# Patient Record
Sex: Female | Born: 1940
Health system: Southern US, Community
[De-identification: ages and names within clinical notes are randomized; demographics above are authoritative.]

## PROBLEM LIST (undated history)

## (undated) DIAGNOSIS — R7301 Impaired fasting glucose: Secondary | ICD-10-CM

## (undated) DIAGNOSIS — K219 Gastro-esophageal reflux disease without esophagitis: Secondary | ICD-10-CM

## (undated) DIAGNOSIS — I10 Essential (primary) hypertension: Secondary | ICD-10-CM

## (undated) DIAGNOSIS — K5792 Diverticulitis of intestine, part unspecified, without perforation or abscess without bleeding: Secondary | ICD-10-CM

## (undated) DIAGNOSIS — E782 Mixed hyperlipidemia: Secondary | ICD-10-CM

## (undated) DIAGNOSIS — E039 Hypothyroidism, unspecified: Secondary | ICD-10-CM

## (undated) DIAGNOSIS — J189 Pneumonia, unspecified organism: Secondary | ICD-10-CM

## (undated) HISTORY — DX: Essential (primary) hypertension: I10

## (undated) HISTORY — DX: Impaired fasting glucose: R73.01

## (undated) HISTORY — PX: OTHER SURGICAL HISTORY: SHX169

## (undated) HISTORY — DX: Mixed hyperlipidemia: E78.2

## (undated) HISTORY — PX: TOTAL ABDOMINAL HYSTERECTOMY W/ BILATERAL SALPINGOOPHORECTOMY: SHX83

## (undated) HISTORY — PX: RECTOCELE REPAIR: SHX761

## (undated) HISTORY — DX: Hypothyroidism, unspecified: E03.9

## (undated) HISTORY — DX: Gastro-esophageal reflux disease without esophagitis: K21.9

## (undated) HISTORY — PX: COLONOSCOPY: SHX174

---
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
## 2000-01-23 ENCOUNTER — Encounter: Payer: Self-pay | Admitting: Obstetrics and Gynecology

## 2000-01-23 ENCOUNTER — Ambulatory Visit (HOSPITAL_COMMUNITY): Admission: RE | Admit: 2000-01-23 | Discharge: 2000-01-23 | Payer: Self-pay | Admitting: Obstetrics and Gynecology

## 2000-03-18 ENCOUNTER — Encounter: Payer: Self-pay | Admitting: Obstetrics and Gynecology

## 2000-03-18 ENCOUNTER — Ambulatory Visit (HOSPITAL_COMMUNITY): Admission: RE | Admit: 2000-03-18 | Discharge: 2000-03-18 | Payer: Self-pay | Admitting: Obstetrics and Gynecology

## 2000-03-24 ENCOUNTER — Encounter (INDEPENDENT_AMBULATORY_CARE_PROVIDER_SITE_OTHER): Payer: Self-pay

## 2000-03-24 ENCOUNTER — Inpatient Hospital Stay (HOSPITAL_COMMUNITY): Admission: RE | Admit: 2000-03-24 | Discharge: 2000-03-26 | Payer: Self-pay | Admitting: Obstetrics and Gynecology

## 2002-02-21 ENCOUNTER — Other Ambulatory Visit: Admission: RE | Admit: 2002-02-21 | Discharge: 2002-02-21 | Payer: Self-pay | Admitting: Obstetrics and Gynecology

## 2003-02-23 ENCOUNTER — Other Ambulatory Visit: Admission: RE | Admit: 2003-02-23 | Discharge: 2003-02-23 | Payer: Self-pay | Admitting: Obstetrics and Gynecology

## 2004-03-07 ENCOUNTER — Other Ambulatory Visit: Admission: RE | Admit: 2004-03-07 | Discharge: 2004-03-07 | Payer: Self-pay | Admitting: Obstetrics and Gynecology

## 2004-09-10 ENCOUNTER — Encounter: Admission: RE | Admit: 2004-09-10 | Discharge: 2004-09-10 | Payer: Self-pay | Admitting: Obstetrics and Gynecology

## 2005-03-14 ENCOUNTER — Other Ambulatory Visit: Admission: RE | Admit: 2005-03-14 | Discharge: 2005-03-14 | Payer: Self-pay | Admitting: Obstetrics and Gynecology

## 2006-09-10 ENCOUNTER — Inpatient Hospital Stay (HOSPITAL_COMMUNITY): Admission: RE | Admit: 2006-09-10 | Discharge: 2006-09-11 | Payer: Self-pay | Admitting: Obstetrics & Gynecology

## 2006-10-01 ENCOUNTER — Ambulatory Visit (HOSPITAL_COMMUNITY): Admission: RE | Admit: 2006-10-01 | Discharge: 2006-10-01 | Payer: Self-pay | Admitting: Internal Medicine

## 2007-10-05 ENCOUNTER — Ambulatory Visit (HOSPITAL_COMMUNITY): Admission: RE | Admit: 2007-10-05 | Discharge: 2007-10-05 | Payer: Self-pay | Admitting: Internal Medicine

## 2008-06-05 ENCOUNTER — Emergency Department (HOSPITAL_COMMUNITY): Admission: EM | Admit: 2008-06-05 | Discharge: 2008-06-05 | Payer: Self-pay | Admitting: Emergency Medicine

## 2008-06-16 ENCOUNTER — Ambulatory Visit (HOSPITAL_COMMUNITY): Admission: RE | Admit: 2008-06-16 | Discharge: 2008-06-16 | Payer: Self-pay | Admitting: Internal Medicine

## 2008-10-13 ENCOUNTER — Ambulatory Visit (HOSPITAL_COMMUNITY): Admission: RE | Admit: 2008-10-13 | Discharge: 2008-10-13 | Payer: Self-pay | Admitting: Internal Medicine

## 2009-03-20 ENCOUNTER — Ambulatory Visit (HOSPITAL_COMMUNITY): Admission: RE | Admit: 2009-03-20 | Discharge: 2009-03-20 | Payer: Self-pay | Admitting: Internal Medicine

## 2009-10-15 ENCOUNTER — Ambulatory Visit (HOSPITAL_COMMUNITY): Admission: RE | Admit: 2009-10-15 | Discharge: 2009-10-15 | Payer: Self-pay | Admitting: Internal Medicine

## 2010-02-24 ENCOUNTER — Encounter: Payer: Self-pay | Admitting: Obstetrics and Gynecology

## 2010-06-18 NOTE — H&P (Signed)
NAME:  Diane Torres, CORDY NO.:  [id number]   MEDICAL RECORD NO.:  [id number]          PATIENT TYPE:  INP   LOCATION:  A336                          FACILITY:  [facility]   PHYSICIAN:  Lazaro Arms, M.D.   DATE OF BIRTH:  13-Dec-1940   DATE OF ADMISSION:  DATE OF DISCHARGE:  LH                              HISTORY & PHYSICAL   Shavontae is a 70 year old female, gravida 2, para 2, who has had a career  where she has done heavy lifting daily at work.  She is recently  retired.  I saw her originally back in May. At that time, she had a  grade IV rectocele and vaginal apex prolapse.  She said she wanted to  finish until she was retired to have surgery, and she retired last week.   On exam again, she does have the grade IV rectocele with vaginal apex  descent.  She is status post hysterectomy.  Anterior compartment is well  supported.   PAST MEDICAL HISTORY:  Hypertension.   She takes Loratadine 10 mg, hydrochlorothiazide 25 mg.  She also has  hypercholesterolemia.  She takes simvastatin.  She has gastroesophageal  reflux and takes Prilosec.  Otherwise, she takes Premarin.  She takes a  baby aspirin a day.   PAST SURGICAL HISTORY:  Hysterectomy.  Thyroid surgery.  Ovarian cyst.   REVIEW OF SYSTEMS:  Otherwise negative.   PHYSICAL EXAMINATION:  HEENT:  Unremarkable.  Thyroid is normal.  LUNGS:  Clear.  HEART:  Regular rate and rhythm without regurg or gallop.  BREASTS:  Without mass, discharge, or skin changes.  ABDOMEN:  Benign.  No hepatosplenomegaly, no masses.  She has normal external genitalia.  The vagina is pink and moist without  discharge.  She does have a grade IV rectocele.  No evidence of  enterocele and vaginal apex prolapse.  EXTREMITIES:  Warm with no edema.  NEUROLOGIC:  Grossly intact.   IMPRESSION:  Grade IV rectocele, symptomatic.   PLAN:  Patient is admitted for a posterior repair with graft and vaginal  vault suspension.  The patient understands the  risks, benefits,  indications, and alternatives and will proceed.      Lazaro Arms, M.D.  Electronically Signed     LHE/MEDQ  D:  09/08/2006  T:  09/09/2006  Job:  [id number]

## 2010-06-18 NOTE — Discharge Summary (Signed)
[name], [name] NO.:  [id number]   MEDICAL RECORD NO.:  [id number]          PATIENT TYPE:  INP   LOCATION:  A336                          FACILITY:  [facility]   PHYSICIAN:  Lazaro Arms, M.D.   DATE OF BIRTH:  1940/05/04   DATE OF ADMISSION:  09/09/2006  DATE OF DISCHARGE:  08/08/2008LH                               DISCHARGE SUMMARY   DISCHARGE DIAGNOSES:  1. Status post a posterior colporrhaphy with placement of graft.  2. Enterocele repair.  3. Vaginal vault suspension.   PROCEDURE:  As above.   Please refer to the history and physical as well as the operative report  for details admission to hospital.   HOSPITAL COURSE:  The patient was admitted postoperatively.  She  tolerated clear liquids and a regular diet.  She voided without  symptoms.  She was ambulatory.  Her packing was removed and she had  appropriate amount of post-op drainage with the graft in place.  Her  pain was well controlled with oral pain medicine.  Her abdominal exam  was benign.   She was discharged to home on the morning of post-op day #2, in good  stable condition.   She is to follow up at the office on the 18th as scheduled.   She is discharged to home on Percocet, Motrin for pain, and Cipro  prophylactically because of the graft posteriorly.   She is given instructions/precautions prior to that time.   She will continue on her medications that she was on preoperatively.      Lazaro Arms, M.D.  Electronically Signed     LHE/MEDQ  D:  09/11/2006  T:  09/11/2006  Job:  [id number]

## 2010-06-18 NOTE — Op Note (Signed)
NAMEAERIEL, Diane Torres NO.:  [id number]   MEDICAL RECORD NO.:  [id number]          PATIENT TYPE:  OBV   LOCATION:  A336                          FACILITY:  [facility]   PHYSICIAN:  Lazaro Arms, M.D.   DATE OF BIRTH:  [birth year]   DATE OF PROCEDURE:  09/09/2006  DATE OF DISCHARGE:                               OPERATIVE REPORT   PREOPERATIVE DIAGNOSIS:  1. Grade 4 rectocele.  2. Vaginal apex prolapse.   POSTOPERATIVE DIAGNOSIS:  1. Grade 4 rectocele.  2. Vaginal apex prolapse.  3. Enterocele.   PROCEDURE:  1. Rectocele repair using a graft.  2. Interspinous vaginal vault suspension.   SURGEON:  Lazaro Arms, M.D.   ANESTHESIA:  Spinal.   FINDINGS:  The patient had, in the supine position, an obvious large  rectocele, grade 4.  At the time of surgery, she was also found to have  an enterocele which was really responsible for the majority of the  significant bulging that we saw.  There were no other abnormalities  seen.   DESCRIPTION OF PROCEDURE:  The patient was taken to the operating room  and placed in the sitting position.  Once spinal anesthetic was  administered, she was placed in the dorsal lithotomy position  in candy  cane stirrups.  She was prepped and draped in the usual sterile fashion.  Allis clamps were used and the mucocutaneous junction was cut and  dissection was performed of the rectum with the vaginal mucosa.  This  was incised in the midline and taken all the way up to the apex of the  vaginal cuff.  0.5% Marcaine with 1:200,000 epinephrine was used for  plane development as well as hemostasis.  At this point, a large  enterocele was identified and it was dissected off the rectum and off  the vagina without difficulty, sharply and bluntly.  The enterocele was  then closed with 3-0 Monocryl in a purse-string fashion as high up in  the peritoneum as I could get.  The excess peritoneum was then excised.  I then placed the Pelvicol graft  in the space between the vagina,  rectum, and enterocele, making sure that the apex of the enterocele was  under the apex of the graft.  The graft was anchored to the sacrospinous  ligaments bilaterally and a vaginal interspinous vaginal vault  suspension was performed by attaching the graft to the vaginal apex.  The Pelvicol was then sutured to the pararectal fascia and anchored  down.  The excess vagina was trimmed  away and the vagina was closed in the usual fashion without difficulty.  The vagina was packed.  Bleeding for the procedure was 200 mL.  All  counts were correct.  The patient received Ancef prophylactically.  She  was taken to the recovery room doing well.      Lazaro Arms, M.D.  Electronically Signed     LHE/MEDQ  D:  09/09/2006  T:  09/09/2006  Job:  [id number]

## 2010-06-21 NOTE — Op Note (Signed)
[facility]  Patient:    Diane Torres, Diane Torres                      MRN: [id number] Proc. Date: 03/24/00 Adm. Date:  [id number] Attending:  Madelyn Flavors CC:         [facility]  Carron Curie, M.D.   Operative Report  PREOPERATIVE DIAGNOSIS:       Right adnexal mass with septation.  POSTOPERATIVE DIAGNOSIS:      1. Right hydrosalpinx.                               2. Normal ovaries bilaterally.                               3. Normal left fallopian tube.                               4. Pelvic adhesions.  OPERATION:                    1. Exploratory laparotomy.                               2. Bilateral salpingo-oophorectomy.                               3. Lysis of adhesions.  SURGEON:                      Beather Arbour. Thomasena Edis, M.D.  ASSISTANT:                    Trevor Iha, M.D.  ESTIMATED BLOOD LOSS:         100 cc.  FLUID:                        1500 cc of crystalloid.  DRAINS:                       Foley.  ANESTHESIA:                   General endotracheal anesthesia.  COMPLICATIONS:                None.  DESCRIPTION OF PROCEDURE:     The patient was brought to the operating room and identified on the operating room table.  After induction of adequate general endotracheal anesthesia, the patient was prepped and draped in the usual sterile fashion.  A Foley catheter was placed.  A Pfannenstiel incision was made and carried down to the fascia.  The fascia was scored in the midline and extended bilaterally using Mayo scissors.  It was then separated free from the underlying muscles.  The muscles were separated in the midline down to the symphysis.  With the patient in Trendelenburg, the peritoneum was entered bluntly, taking care to avoid bowel or other abdominal contents.  The peritoneal incision was extended superiorly then inferiorly down to the bladder edge.  Washings were taken.  Very careful abdominal  exploration was conducted.  Liver edge was noted to be smooth.  Peritoneal surfaces  were noted to be smooth.  There were no enlarged pelvic or periaortic nodes.  The OConnor-OSullivan retractor was placed, and the bowel was packed out of the operative field.  The patient did have a bowel prep with Golytely on the night prior to the procedure.  The right round ligament was identified, divided using cautery, and the anterior leaflet of the broad ligament was separated as it was adjacent to the tube.  A clear space was developed, and the infundibulopelvic ligament was clamped with a curved Heaney, cut, tied with a free tie and then a suture ligature of 0 Vicryl.  The bladder was noted to be adhesed over the hydrosalpinx, and the adhesions of this were taken down. There were noted to be numerous pelvic adhesions to the inferior portion of the right tube, and these were taken down as well.  Once all adhesions were taken down and the tube was on a small pedicle, a Heaney clamp was clamped across the pedicle, cut, tied with a free tie and a suture ligature of 0 Vicryl.  The right hydrosalpinx and right ovary were sent to pathology for examination.  In a similar fashion, the left round ligament was tied with a suture ligature of 0 Vicryl, divided using cautery.  The left infundibulopelvic ligament was then isolated, doubly clamped with Heaney clamps, cut, and tied with a free tie then a suture ligature.  After freeing up the left tube and ovary, both of which appeared grossly normal, the clamp was placed across the pedicle, and the left tube and ovary were excised and sent to pathology for examination.  The pedicle was tied with a free tie and then a stick tie.  There was noted to be a small amount of venous bleeding which was controlled grasping the venous bleeding area with a right-angle clamp and then tying with a suture ligature.  Excellent hemostasis was noted. Thus, it should be noted that  there are numerous adhesions of the right tube to the inferior pelvis and the pelvic sidewall, and these were lysed in order to facilitate removal of the right tube.  At that point, the pelvis was irrigated copiously with warm lactated Ringers. Excellent hemostasis was noted.  Because there appeared to be a thin area of the bladder wall, indigo carmine was irrigated into the bladder, approximately 400 cc, but there was noted to be no cystotomy.  However, the area was oversewn using a simple running interlocking suture of 2-0 Vicryl.  At that point, the procedure was then terminated.  The patient was taken out of Trendelenburg.  Both the bowel packs holding the bowel out of the way and the OConnor-OSullivan retractor were then removed.  The fascial areas were examined for hemostasis, and excellent hemostasis was achieved using cautery. Kelly clamps were placed on the peritoneum, the peritoneum closed using simple running suture of 0 Vicryl.  After achieving excellent subfascial hemostasis, the fascia was closed using a simple running suture of 0 PDS which was anchored at the left most angle of incision, running to the right most angle of the incision in a simple fashion.  The subcutaneous tissue was then irrigated copiously with warm lactated Ringers, and excellent hemostasis was achieved using cautery.  The skin was closed with staples.  The patient tolerated the procedure well without apparent complications and was transferred to the recovery room in stable condition after instrument, sponge, and needle counts were correct. DD:  03/24/00 TD:  03/24/00 Job: [id number]

## 2010-06-21 NOTE — Discharge Summary (Signed)
[facility]  Patient:    Diane Torres, Diane Torres                      MRN: [id number] Adm. Date:  [id number] Disc. Date: [id number] Attending:  Madelyn Flavors CC:         [facility]  Dr. Talmage Coin, [facility] [place], [place]   Discharge Summary  HISTORY OF PRESENT ILLNESS:  The patient is a 70 year old, para 2, Caucasian female referred for evaluation of a pelvic mass. She was found to have a large right septated ovarian cyst measuring 2.4 x 2.4 x 2.4 cm and 2.9 x 2.6 x 3.6 cm. For further details, please see the dictated history and physical.  HOSPITAL COURSE:  The patient was admitted and subsequently underwent an exploratory laparotomy, bilateral salpingo-oophorectomy, and lysis of adhesions for a pelvic mass in a postmenopausal woman. The patient tolerated the procedure well. She was found to have a right hydrosalpinx with normal ovaries. Postoperatively the patient was afebrile with lung fields clear and abdomen soft and nontender. The incision was clean, dry and intact. There was no CVA tenderness and no calf tenderness. Postoperative hemoglobin was 10.7, white count 6700. On postoperative day 2, the patient desired discharged, had been afebrile and was tolerating her diet. She did have bowel sounds but no flatus and she was discharged home on postoperative day two, given extensive postoperative discharge instructions, urged to do no heavy lifting and to call with any problems such as temperature greater than 100.5 persistently or any purulent drainage from her wound. In addition, she is not ______ for two weeks and return for an incision check in two weeks. She was also instructed that she could take ibuprofen 800 mg p.o. q. 8h p.r.n. pain. She was urged to call with any problems. DD:  04/22/00 TD:  04/23/00 Job: [id number]

## 2010-06-21 NOTE — Op Note (Signed)
[facility]  Patient:    Diane Torres, Diane Torres Visit Number: [id number] MRN: [id number]          Service Type: GYN Location: 4E 0408 01 Attending Physician:  Madelyn Flavors Proc. Date: 03/24/00 Admit Date:  03/24/2000   CC:         [facility]             Carron Curie, M.D., [facility]                           Operative Report  PREOPERATIVE DIAGNOSIS:       Right adnexal mass with septation.  POSTOPERATIVE DIAGNOSES:      1. Right hydrosalpinx, normal ovaries                                  bilaterally, normal left fallopian tube.                               2. Pelvic adhesions.  OPERATIVE PROCEDURE:          1. Exploratory laparotomy.                               2. Bilateral salpingo-oophorectomy.                               3. Lysis of adhesions.  SURGEON:                      Beather Arbour. Thomasena Edis, M.D., PhD.  ASSISTANT:                    Trevor Iha, M.D.  ESTIMATED BLOOD LOSS:         100 cc.  INTRAVENOUS FLUIDS:           1500 cc Crystalloid.  DRAINS/TUBES:                 Foley.  ANESTHESIA:                   General endotracheal.  COMPLICATIONS:                None.  DESCRIPTION OF PROCEDURE:     The patient was brought to the operating room and identified on the operating room table. After induction of adequate general endotracheal anesthesia, the patient was prepped and draped in the usual sterile fashion. A Foley catheter was placed. A Pfannenstiel skin incision was made and carried down to the fascia. The fascia was scored in the midline and extended bilaterally using Mayo scissors. It was then separated free from the underlying muscles. The muscles were separated in the midline down to the symphysis. With the patient in Trendelenburg, the peritoneum was entered bluntly taking care to avoid bowel or other abdominal contents. The peritoneal incision was extended superiorly, then  inferiorly down to the bladder edge. Washings were taken and a very careful abdominal exploration was conducted. The liver edge was noted to be smooth. The peritoneal surfaces were noted to be smooth. There were no enlarged pelvic or periaortic nodes. The OConnor-OSullivan retractor was placed and the bowel was packed out of  the operative field. The patient did have prep with GoLYTELY the night prior to the procedure. The right round ligament was identified and divided using cautery, and the anterior leaf of the broad ligament was separated as it was adjacent to the tube. A clear space was developed and the infundibulopelvic ligament was clamped with a curved Heaney, cut, tied with a free tie, then a suture ligature of #0 Vicryl. The bladder was noted to be adhesed over the hydrosalpinx and the adhesions of this were taken down. There were noted to be numerous pelvic adhesions to the inferior portion of the right tube and these were taken down as well. Once all adhesions were taken down and the tube was on a small pedicle, a Heaney clamp was clamped across the pedicle, cut, tied with a free tie, then a suture ligature of #0 Vicryl. The right hydrosalpinx and right ovary were sent to pathology for examination. In a similar fashion, the left round ligament was tied with a suture ligature of #0 Vicryl and divided using cautery. The left infundibulopelvic ligament was then isolated, doubly clamped with Heaney clamps, cut and tied with a free tie, then a suture ligature. After freeing up the left tube and ovary both of which appeared grossly normal, a clamp was placed across the pedicle and the left tube and ovary were excised and sent to pathology for examination. The pedicle was tied with a free tie, then a stick tie. There was noted to be a small amount of venous bleeding which was controlled grasping the venous bleeding area with a right angle clamp and then tieing with a suture ligature;  excellent hemostasis was noted. Thus, it should be noted that there are numerous adhesions of the right tube to the inferior pelvis and the pelvic sidewall and these were lysed in order to facilitate removal of the right tube. At that point the pelvis was irrigated copiously with warm Ringers lactate; excellent hemostasis was noted. Because there appeared to be a thin area of the bladder wall, Indigo carmine was irrigated into the bladder, approximately 400 cc, but there was noted to be no cystotomy, however, the area was oversewn using a simple running interlocking suture of 2-0 Vicryl. At that point the procedure was then terminated, the patient was taken out of Trendelenburg, the bowel packs holding the bowel out of the way, and the OConnor-OSullivan retractor were then removed. The fascial areas were examined for evident hemostasis and excellent hemostasis was achieved using cautery. Kelly clamps were placed on the peritoneum and the peritoneum was closed using simple running suture of #0 Vicryl. After achieving excellent subfascial hemostasis, the fascia was closed using a simple running suture of #0 PDS which was anchored at the left most angle, incision running to the right most angle to the incision in a simple fashion. The subcutaneous tissue was then irrigated copiously with warm Ringers lactate and excellent hemostasis was achieved using cautery. The skin was closed with staples.  The patient tolerated the procedure well without apparent complications and was transferred to the recovery room in stable condition, after all sponge, needle, lap and instrument counts were correct. Attending Physician:  Madelyn Flavors DD:  03/24/00 TD:  03/24/00 Job: [id number]

## 2010-06-21 NOTE — H&P (Signed)
[facility]  Patient:    Diane Torres, Diane Torres                          MRN: [id number] Adm. Date:  03/24/00 Attending:  Beather Arbour. Thomasena Edis, M.D.                         History and Physical  DATE OF BIRTH:  11/15/1940.  SOCIAL SECURITY NUMBER:  [id number].  HISTORY OF PRESENT ILLNESS:  The patient is a 70 year old para 2, Caucasian female referred for evaluation of a pelvic mass.  An ultrasound had been performed by the patients family physician, Dr. Carron Curie, and she was found to have a 4.77 x 2.73 x 3.60 cystic mass in the right adnexal region, with immediately superior to this being a 2.4 x 1.0 x 1.9-cm adjacent cyst.  Patient subsequently underwent a followup ultrasound at [facility] which showed a large right septated cyst or two separate cysts with thin wall dividing them measuring 2.4 x 2.4 x 2.4 cm and 2.9 x 2.6 x 3.6 cm. No abnormal flow was demonstrated.  A CA125 was performed and noted to be normal.  The patient did have a followup ultrasound just in the last few days at [facility] with no change in the mass.  She is admitted for exploratory laparotomy and bilateral salpingo-oophorectomy.  Patient states that she is quite concerned about this and desires removal of both tubes and ovaries.  As this has not significantly changed in the last few months, I have explained to her that I think the likelihood of this being malignant is unlikely and thus we will proceed with a Pfannenstiel incision. Dr. Rande Brunt. Clarke-Pearson is on standby in case this is malignant.  Patient does understand that if it is malignant, a T incision might be necessary.  She adamantly states that she does desire removal of both tubes and ovaries and she does not desire expectant management.  Risks of surgery including anesthetic complication, hemorrhage or infection, damage to adjacent organs including bladder, bowel, blood vessels or ureter were  discussed with patient. She was made aware of postop risks which include wound infection, urinary tract infection, DVT which could result in stroke or pulmonary embolism as well as other unforeseen risks.  She expresses understanding of and acceptances of these risks.  She also will have a bowel prep due to her previous history.  PAST MEDICAL HISTORY:  History of headaches; also history of left thyroidectomy in 1976 for a thyroid cyst and total vaginal hysterectomy for menorrhagia.  FAMILY HISTORY:  History of mother with diabetes, treated with oral agents, who subsequently required insulin; her mother died of congestive heart failure.  She has two brothers, age 5 and 41, both of whom have had myocardial infarctions and CABGs.  MEDICATIONS:  Premarin 0.625 mg and Duradrin.  SOCIAL HISTORY:  The patient works at [employer].  She does not smoke and uses only occasional caffeine.  ALLERGIES:  No known drug allergies.  PHYSICAL EXAMINATION  GENERAL:  Well-developed, well-nourished Caucasian female in no acute distress.  HEENT:  Normal.  NECK:  Supple without thyromegaly.  LUNGS:  Clear to auscultation.  CARDIAC:  Regular rate and rhythm.  ABDOMEN:  Soft and nontender.  No hepatosplenomegaly.  No fluid wave.  PELVIC:  Examination reveals well-healed vaginal cuff.  A right adnexal mass is palpated.  Patient has undergone a recent  Pap smear by Dr. Jason Fila which was within normal limits.  ASSESSMENT AND PLAN:  The patient is a 70 year old para 2 para 2, Caucasian female admitted for exploratory laparotomy and bilateral salpingo-oophorectomy due to a cystic right ovarian mass which is septated or could be two separate cysts divided by a thin wall.  Risks have been discussed with the patient and she expresses understanding of and acceptance of these risks. DD:  03/24/00 TD:  03/24/00 Job: [id number]

## 2010-10-08 ENCOUNTER — Other Ambulatory Visit (HOSPITAL_COMMUNITY): Payer: Self-pay | Admitting: Internal Medicine

## 2010-10-08 DIAGNOSIS — Z139 Encounter for screening, unspecified: Secondary | ICD-10-CM

## 2010-10-18 ENCOUNTER — Ambulatory Visit (HOSPITAL_COMMUNITY)
Admission: RE | Admit: 2010-10-18 | Discharge: 2010-10-18 | Disposition: A | Discharge status: Home / Self Care | Payer: Medicare Other | Source: Ambulatory Visit | Attending: Internal Medicine | Admitting: Internal Medicine

## 2010-10-18 DIAGNOSIS — Z1231 Encounter for screening mammogram for malignant neoplasm of breast: Secondary | ICD-10-CM | POA: Insufficient documentation

## 2010-10-18 DIAGNOSIS — Z139 Encounter for screening, unspecified: Secondary | ICD-10-CM

## 2010-11-18 LAB — URINALYSIS, ROUTINE W REFLEX MICROSCOPIC
Bilirubin Urine: NEGATIVE
Nitrite: NEGATIVE
Specific Gravity, Urine: <1.005 — ABNORMAL LOW
Urobilinogen, UA: 0.2
pH: 7

## 2010-11-18 LAB — TYPE AND SCREEN: Antibody Screen: NEGATIVE

## 2010-11-18 LAB — CBC
HCT: 36.4
Hemoglobin: 10 — ABNORMAL LOW
MCHC: 34
MCHC: 34.1
MCV: 89.2
MCV: 90
Platelets: 292
Platelets: 305
RBC: 3.24 — ABNORMAL LOW
RBC: 3.64 — ABNORMAL LOW
RDW: 12.9
WBC: 14.7 — ABNORMAL HIGH
WBC: 9.8

## 2010-11-18 LAB — COMPREHENSIVE METABOLIC PANEL
Albumin: 3.3 — ABNORMAL LOW
BUN: 14
Calcium: 8.8
Creatinine, Ser: 0.83
Glucose, Bld: 111 — ABNORMAL HIGH
Potassium: 4.1
Total Protein: 6.3

## 2010-11-18 LAB — DIFFERENTIAL
Basophils Relative: 0
Eosinophils Absolute: 0
Lymphocytes Relative: 25
Lymphocytes Relative: 29
Lymphs Abs: 2.4
Lymphs Abs: 2.8
Monocytes Absolute: 0.6
Monocytes Absolute: 0.7
Monocytes Relative: 5
Monocytes Relative: 6
Monocytes Relative: 7
Neutro Abs: 12.1 — ABNORMAL HIGH
Neutro Abs: 5.9
Neutro Abs: 6.2
Neutrophils Relative %: 60
Neutrophils Relative %: 66
Neutrophils Relative %: 82 — ABNORMAL HIGH

## 2011-08-20 DIAGNOSIS — D235 Other benign neoplasm of skin of trunk: Secondary | ICD-10-CM | POA: Diagnosis not present

## 2011-08-20 DIAGNOSIS — L570 Actinic keratosis: Secondary | ICD-10-CM | POA: Diagnosis not present

## 2011-10-02 DIAGNOSIS — R21 Rash and other nonspecific skin eruption: Secondary | ICD-10-CM | POA: Diagnosis not present

## 2011-10-02 DIAGNOSIS — J309 Allergic rhinitis, unspecified: Secondary | ICD-10-CM | POA: Diagnosis not present

## 2011-10-02 DIAGNOSIS — H1045 Other chronic allergic conjunctivitis: Secondary | ICD-10-CM | POA: Diagnosis not present

## 2011-12-09 DIAGNOSIS — Z23 Encounter for immunization: Secondary | ICD-10-CM | POA: Diagnosis not present

## 2012-02-19 ENCOUNTER — Other Ambulatory Visit (HOSPITAL_COMMUNITY): Payer: Self-pay | Admitting: Internal Medicine

## 2012-02-19 DIAGNOSIS — Z139 Encounter for screening, unspecified: Secondary | ICD-10-CM

## 2012-02-20 ENCOUNTER — Ambulatory Visit (HOSPITAL_COMMUNITY)
Admission: RE | Admit: 2012-02-20 | Discharge: 2012-02-20 | Disposition: A | Discharge status: Home / Self Care | Payer: Medicare Other | Source: Ambulatory Visit | Attending: Internal Medicine | Admitting: Internal Medicine

## 2012-02-20 DIAGNOSIS — Z139 Encounter for screening, unspecified: Secondary | ICD-10-CM

## 2012-02-20 DIAGNOSIS — Z1231 Encounter for screening mammogram for malignant neoplasm of breast: Secondary | ICD-10-CM | POA: Insufficient documentation

## 2012-02-24 DIAGNOSIS — Z79899 Other long term (current) drug therapy: Secondary | ICD-10-CM | POA: Diagnosis not present

## 2012-02-24 DIAGNOSIS — E039 Hypothyroidism, unspecified: Secondary | ICD-10-CM | POA: Diagnosis not present

## 2012-02-24 DIAGNOSIS — E785 Hyperlipidemia, unspecified: Secondary | ICD-10-CM | POA: Diagnosis not present

## 2012-03-02 DIAGNOSIS — E039 Hypothyroidism, unspecified: Secondary | ICD-10-CM | POA: Diagnosis not present

## 2012-03-02 DIAGNOSIS — N289 Disorder of kidney and ureter, unspecified: Secondary | ICD-10-CM | POA: Diagnosis not present

## 2012-03-02 DIAGNOSIS — E785 Hyperlipidemia, unspecified: Secondary | ICD-10-CM | POA: Diagnosis not present

## 2012-05-24 DIAGNOSIS — Z79899 Other long term (current) drug therapy: Secondary | ICD-10-CM | POA: Diagnosis not present

## 2012-06-01 DIAGNOSIS — Z79899 Other long term (current) drug therapy: Secondary | ICD-10-CM | POA: Diagnosis not present

## 2012-06-08 DIAGNOSIS — N289 Disorder of kidney and ureter, unspecified: Secondary | ICD-10-CM | POA: Diagnosis not present

## 2012-09-30 DIAGNOSIS — J309 Allergic rhinitis, unspecified: Secondary | ICD-10-CM | POA: Diagnosis not present

## 2012-09-30 DIAGNOSIS — H1045 Other chronic allergic conjunctivitis: Secondary | ICD-10-CM | POA: Diagnosis not present

## 2012-09-30 DIAGNOSIS — R21 Rash and other nonspecific skin eruption: Secondary | ICD-10-CM | POA: Diagnosis not present

## 2013-02-22 ENCOUNTER — Other Ambulatory Visit (HOSPITAL_COMMUNITY): Payer: Self-pay | Admitting: Internal Medicine

## 2013-02-22 DIAGNOSIS — Z139 Encounter for screening, unspecified: Secondary | ICD-10-CM

## 2013-02-24 DIAGNOSIS — E039 Hypothyroidism, unspecified: Secondary | ICD-10-CM | POA: Diagnosis not present

## 2013-02-24 DIAGNOSIS — Z79899 Other long term (current) drug therapy: Secondary | ICD-10-CM | POA: Diagnosis not present

## 2013-02-24 DIAGNOSIS — R7301 Impaired fasting glucose: Secondary | ICD-10-CM | POA: Diagnosis not present

## 2013-02-24 DIAGNOSIS — N182 Chronic kidney disease, stage 2 (mild): Secondary | ICD-10-CM | POA: Diagnosis not present

## 2013-02-24 DIAGNOSIS — E785 Hyperlipidemia, unspecified: Secondary | ICD-10-CM | POA: Diagnosis not present

## 2013-02-25 ENCOUNTER — Ambulatory Visit (HOSPITAL_COMMUNITY)
Admission: RE | Admit: 2013-02-25 | Discharge: 2013-02-25 | Disposition: A | Discharge status: Home / Self Care | Payer: Medicare Other | Source: Ambulatory Visit | Attending: Internal Medicine | Admitting: Internal Medicine

## 2013-02-25 DIAGNOSIS — Z139 Encounter for screening, unspecified: Secondary | ICD-10-CM

## 2013-02-25 DIAGNOSIS — Z1231 Encounter for screening mammogram for malignant neoplasm of breast: Secondary | ICD-10-CM | POA: Diagnosis not present

## 2013-03-04 DIAGNOSIS — R079 Chest pain, unspecified: Secondary | ICD-10-CM | POA: Diagnosis not present

## 2013-03-04 DIAGNOSIS — E785 Hyperlipidemia, unspecified: Secondary | ICD-10-CM | POA: Diagnosis not present

## 2013-03-04 DIAGNOSIS — E039 Hypothyroidism, unspecified: Secondary | ICD-10-CM | POA: Diagnosis not present

## 2013-03-09 ENCOUNTER — Encounter: Payer: Self-pay | Admitting: *Deleted

## 2013-03-09 DIAGNOSIS — E785 Hyperlipidemia, unspecified: Secondary | ICD-10-CM

## 2013-03-09 DIAGNOSIS — E782 Mixed hyperlipidemia: Secondary | ICD-10-CM | POA: Insufficient documentation

## 2013-03-09 DIAGNOSIS — N19 Unspecified kidney failure: Secondary | ICD-10-CM | POA: Insufficient documentation

## 2013-03-09 DIAGNOSIS — E039 Hypothyroidism, unspecified: Secondary | ICD-10-CM | POA: Insufficient documentation

## 2013-03-09 DIAGNOSIS — R7301 Impaired fasting glucose: Secondary | ICD-10-CM | POA: Insufficient documentation

## 2013-03-10 ENCOUNTER — Encounter: Payer: Self-pay | Admitting: Cardiology

## 2013-03-10 ENCOUNTER — Encounter (INDEPENDENT_AMBULATORY_CARE_PROVIDER_SITE_OTHER): Payer: Self-pay

## 2013-03-10 ENCOUNTER — Ambulatory Visit (INDEPENDENT_AMBULATORY_CARE_PROVIDER_SITE_OTHER): Payer: Medicare Other | Admitting: Cardiology

## 2013-03-10 VITALS — BP 139/62 | HR 84 | Ht 62.0 in | Wt 174.0 lb

## 2013-03-10 DIAGNOSIS — R072 Precordial pain: Secondary | ICD-10-CM

## 2013-03-10 DIAGNOSIS — E782 Mixed hyperlipidemia: Secondary | ICD-10-CM

## 2013-03-10 DIAGNOSIS — R7301 Impaired fasting glucose: Secondary | ICD-10-CM | POA: Diagnosis not present

## 2013-03-10 NOTE — Assessment & Plan Note (Signed)
She continues on Zocor, recent LDL 88.

## 2013-03-10 NOTE — Assessment & Plan Note (Signed)
Keep followup with Dr. Willey Blade.

## 2013-03-10 NOTE — Patient Instructions (Addendum)
Your physician recommends that you schedule a follow-up appointment to be determined  We will call you with test reults    Your physician has requested that you have en exercise stress myoview. For further information please visit [email or web address]. Please follow instruction sheet, as given.       Thanks for choosing [facility] !

## 2013-03-10 NOTE — Progress Notes (Signed)
Clinical Summary Diane Torres is a 73 y.o.female referred for cardiology consultation by Dr. Willey Blade. She reports a 2 to three-month history of intermittent chest discomfort. She describes a dull pressure sensation sometimes on the right side of her chest, sometimes on the left side of her chest, sometimes with radiation to the arms. There does not appear to be any obvious precipitant as best she can tell. Just as likely to occur at rest as with exertion. Interestingly, she sometimes notices this more when she raises her arms or twists her thorax. She has been trying to lose weight, uses a stationary bicycle, is not able to go farther than a half a mile. She has leg fatigue and shortness of breath.  Recent lab work showed hemoglobin 12.8, platelets 321, potassium 4.4, BUN 21, creatinine 1.1, normal AST and ALT, cholesterol 168, triglycerides 141, HDL 52, LDL 88, TSH 6.8.  ECG today shows sinus rhythm with decreased R-wave progression and low voltage.  She does have a family history of CAD. She reports stress testing perhaps 20 years ago, no followup since that time.   No Known Allergies  Current Outpatient Prescriptions  Medication Sig Dispense Refill  . fluticasone (FLONASE) 50 MCG/ACT nasal spray Place 1 spray into the nose daily.       Marland Kitchen loratadine (CLARITIN) 10 MG tablet Take 10 mg by mouth daily.      Marland Kitchen omeprazole (PRILOSEC) 20 MG capsule Take 20 mg by mouth daily.      . simvastatin (ZOCOR) 40 MG tablet Take 40 mg by mouth daily.       No current facility-administered medications for this visit.    Past Medical History  Diagnosis Date  . Mixed hyperlipidemia   . Impaired fasting glucose   . Hypothyroidism   . GERD (gastroesophageal reflux disease)     Past Surgical History  Procedure Laterality Date  . Total abdominal hysterectomy w/ bilateral salpingoophorectomy    . Left lobe thyroidectomy      Family History  Problem Relation Age of Onset  . CAD Brother     2 brothers  with CAD in their 47s  . CAD Father   . Heart failure Mother   . Diabetes Mellitus II Mother     Social History Ms. Julin reports that she has never smoked. She does not have any smokeless tobacco history on file. Ms. Nippert reports that she does not drink alcohol.  Review of Systems States she sometimes has a feeling of palpitations when she wakes up from a nap. Also husband wakes her up at night stating that she is "not breathing." Not obvious that she snores. No orthopnea or PND. Otherwise negative.  Physical Examination Filed Vitals:   03/10/13 0902  BP: 139/62  Pulse: 84   Filed Weights   03/10/13 0902  Weight: 174 lb (78.926 kg)   Overweight woman, no distress. HEENT: Conjunctiva and lids normal, oropharynx clear. Neck: Supple, no elevated JVP or carotid bruits, no thyromegaly. Lungs: Clear to auscultation, nonlabored breathing at rest. Cardiac: Regular rate and rhythm, no S3 or significant systolic murmur, no pericardial rub. Abdomen: Soft, nontender, bowel sounds present. Extremities: Trace edema, distal pulses 2+. Skin: Warm and dry. Musculoskeletal: No kyphosis. Neuropsychiatric: Alert and oriented x3, affect grossly appropriate.   Problem List and Plan   Precordial pain Typical and atypical features, not clearly exertional. Some sound more musculoskeletal based on description. She has noticed some functional limitations when she tries to exercise on a stationary  bicycle. ECG is nonspecific. She has a history of hyperlipidemia, also family history of CAD. We discussed further ischemic testing, will schedule an exercise Cardiolite. We will inform her of the results.  Mixed hyperlipidemia She continues on Zocor, recent LDL 88.  Impaired fasting glucose Keep followup with Dr. Willey Blade.    Satira Sark, M.D., F.A.C.C.

## 2013-03-10 NOTE — Assessment & Plan Note (Signed)
Typical and atypical features, not clearly exertional. Some sound more musculoskeletal based on description. She has noticed some functional limitations when she tries to exercise on a stationary bicycle. ECG is nonspecific. She has a history of hyperlipidemia, also family history of CAD. We discussed further ischemic testing, will schedule an exercise Cardiolite. We will inform her of the results.

## 2013-03-18 ENCOUNTER — Encounter (HOSPITAL_COMMUNITY)
Admission: RE | Admit: 2013-03-18 | Discharge: 2013-03-18 | Disposition: A | Discharge status: Home / Self Care | Payer: Medicare Other | Source: Ambulatory Visit | Attending: Cardiology | Admitting: Cardiology

## 2013-03-18 ENCOUNTER — Encounter (HOSPITAL_COMMUNITY): Payer: Self-pay

## 2013-03-18 DIAGNOSIS — R079 Chest pain, unspecified: Secondary | ICD-10-CM

## 2013-03-18 DIAGNOSIS — R072 Precordial pain: Secondary | ICD-10-CM

## 2013-03-18 MED ORDER — TECHNETIUM TC 99M SESTAMIBI - CARDIOLITE
30.0000 | Freq: Once | INTRAVENOUS | Status: AC | PRN
  Administered 2013-03-18: 30 via INTRAVENOUS

## 2013-03-18 MED ORDER — REGADENOSON 0.4 MG/5ML IV SOLN
INTRAVENOUS | Status: AC
  Filled 2013-03-18: qty 5

## 2013-03-18 MED ORDER — TECHNETIUM TC 99M SESTAMIBI GENERIC - CARDIOLITE
10.0000 | Freq: Once | INTRAVENOUS | Status: AC | PRN
  Administered 2013-03-18: 10 via INTRAVENOUS

## 2013-03-18 MED ORDER — SODIUM CHLORIDE 0.9 % IJ SOLN
INTRAMUSCULAR | Status: AC
  Administered 2013-03-18: 10 mL via INTRAVENOUS
  Filled 2013-03-18: qty 10

## 2013-03-18 NOTE — Progress Notes (Signed)
Stress Lab Nurses Notes - [name]  ALMARIE KURDZIEL 03/18/2013 Reason for doing test: Chest Pain Type of test: Stress Cardiolite Nurse performing test: Gerrit Halls, RN Nuclear Medicine Tech: Melburn Hake Echo Tech: Not Applicable MD performing test: P. Nishan/K.Lawrence NP Family MD: Willey Blade Test explained and consent signed: yes IV started: 22g jelco, Saline lock flushed, No redness or edema and Saline lock started in radiology Symptoms: Fatigue in legs Treatment/Intervention: None Reason test stopped: fatigue and reached target HR After recovery IV was: Discontinued via X-ray tech and No redness or edema Patient to return to [facility]. Med at : 12:15 Patient discharged: Home Patient's Condition upon discharge was: stable Comments: During stress peak BP 218/75 & HR 141.  Recovery BP 178/63 & HR 90.  Symptoms resolved in recovery. Geanie Cooley T

## 2013-03-23 ENCOUNTER — Encounter: Payer: Self-pay | Admitting: *Deleted

## 2013-04-18 ENCOUNTER — Other Ambulatory Visit (HOSPITAL_COMMUNITY): Payer: Self-pay | Admitting: Internal Medicine

## 2013-04-18 DIAGNOSIS — R1032 Left lower quadrant pain: Secondary | ICD-10-CM

## 2013-04-19 ENCOUNTER — Ambulatory Visit (HOSPITAL_COMMUNITY)
Admission: RE | Admit: 2013-04-19 | Discharge: 2013-04-19 | Disposition: A | Discharge status: Home / Self Care | Payer: Medicare Other | Source: Ambulatory Visit | Attending: Internal Medicine | Admitting: Internal Medicine

## 2013-04-19 DIAGNOSIS — R1032 Left lower quadrant pain: Secondary | ICD-10-CM

## 2013-04-19 DIAGNOSIS — I700 Atherosclerosis of aorta: Secondary | ICD-10-CM | POA: Insufficient documentation

## 2013-04-19 DIAGNOSIS — K5732 Diverticulitis of large intestine without perforation or abscess without bleeding: Secondary | ICD-10-CM | POA: Diagnosis not present

## 2013-04-19 LAB — POCT I-STAT CREATININE: CREATININE: 1.6 mg/dL — AB (ref 0.50–1.10)

## 2013-04-19 MED ORDER — IOHEXOL 300 MG/ML  SOLN
100.0000 mL | Freq: Once | INTRAMUSCULAR | Status: AC | PRN
  Administered 2013-04-19: 80 mL via INTRAVENOUS

## 2013-04-25 DIAGNOSIS — K5732 Diverticulitis of large intestine without perforation or abscess without bleeding: Secondary | ICD-10-CM | POA: Diagnosis not present

## 2013-04-25 DIAGNOSIS — I700 Atherosclerosis of aorta: Secondary | ICD-10-CM | POA: Diagnosis not present

## 2013-08-30 ENCOUNTER — Emergency Department (HOSPITAL_COMMUNITY)
Admission: EM | Admit: 2013-08-30 | Discharge: 2013-08-30 | Disposition: A | Discharge status: Home / Self Care | Payer: Medicare Other | Attending: Emergency Medicine | Admitting: Emergency Medicine

## 2013-08-30 ENCOUNTER — Encounter (HOSPITAL_COMMUNITY): Payer: Self-pay | Admitting: Emergency Medicine

## 2013-08-30 DIAGNOSIS — Z7982 Long term (current) use of aspirin: Secondary | ICD-10-CM | POA: Diagnosis not present

## 2013-08-30 DIAGNOSIS — Z79899 Other long term (current) drug therapy: Secondary | ICD-10-CM | POA: Insufficient documentation

## 2013-08-30 DIAGNOSIS — Y9389 Activity, other specified: Secondary | ICD-10-CM | POA: Insufficient documentation

## 2013-08-30 DIAGNOSIS — L02419 Cutaneous abscess of limb, unspecified: Secondary | ICD-10-CM | POA: Insufficient documentation

## 2013-08-30 DIAGNOSIS — T6391XA Toxic effect of contact with unspecified venomous animal, accidental (unintentional), initial encounter: Secondary | ICD-10-CM | POA: Insufficient documentation

## 2013-08-30 DIAGNOSIS — L03119 Cellulitis of unspecified part of limb: Secondary | ICD-10-CM | POA: Diagnosis not present

## 2013-08-30 DIAGNOSIS — Z862 Personal history of diseases of the blood and blood-forming organs and certain disorders involving the immune mechanism: Secondary | ICD-10-CM | POA: Diagnosis not present

## 2013-08-30 DIAGNOSIS — Y929 Unspecified place or not applicable: Secondary | ICD-10-CM | POA: Diagnosis not present

## 2013-08-30 DIAGNOSIS — K219 Gastro-esophageal reflux disease without esophagitis: Secondary | ICD-10-CM | POA: Insufficient documentation

## 2013-08-30 DIAGNOSIS — Z8639 Personal history of other endocrine, nutritional and metabolic disease: Secondary | ICD-10-CM | POA: Diagnosis not present

## 2013-08-30 DIAGNOSIS — L0390 Cellulitis, unspecified: Secondary | ICD-10-CM

## 2013-08-30 DIAGNOSIS — T63461A Toxic effect of venom of wasps, accidental (unintentional), initial encounter: Secondary | ICD-10-CM | POA: Diagnosis not present

## 2013-08-30 MED ORDER — OXYCODONE-ACETAMINOPHEN 5-325 MG PO TABS
1.0000 | ORAL_TABLET | Freq: Once | ORAL | Status: AC
  Administered 2013-08-30: 1 via ORAL
  Filled 2013-08-30: qty 1

## 2013-08-30 MED ORDER — CEPHALEXIN 500 MG PO CAPS
500.0000 mg | ORAL_CAPSULE | Freq: Once | ORAL | Status: AC
  Administered 2013-08-30: 500 mg via ORAL
  Filled 2013-08-30: qty 1

## 2013-08-30 MED ORDER — OXYCODONE-ACETAMINOPHEN 5-325 MG PO TABS
1.0000 | ORAL_TABLET | ORAL | Status: DC | PRN
Start: 2013-08-30 — End: 2014-08-18

## 2013-08-30 MED ORDER — CEPHALEXIN 500 MG PO CAPS: 500.0000 mg | ORAL_CAPSULE | Freq: Four times a day (QID) | ORAL | Status: DC

## 2013-08-30 NOTE — Discharge Instructions (Signed)
Bee, Wasp, or Hornet Sting Your caregiver has diagnosed you as having an insect sting. An insect sting appears as a red lump in the skin that sometimes has a tiny hole in the center, or it may have a stinger in the center of the wound. The most common stings are from wasps, hornets and bees. Individuals have different reactions to insect stings.  A normal reaction may cause pain, swelling, and redness around the sting site.  A localized allergic reaction may cause swelling and redness that extends beyond the sting site.  A large local reaction may continue to develop over the next 12 to 36 hours.  On occasion, the reactions can be severe (anaphylactic reaction). An anaphylactic reaction may cause wheezing; difficulty breathing; chest pain; fainting; raised, itchy, red patches on the skin; a sick feeling to your stomach (nausea); vomiting; cramping; or diarrhea. If you have had an anaphylactic reaction to an insect sting in the past, you are more likely to have one again. HOME CARE INSTRUCTIONS   With bee stings, a small sac of poison is left in the wound. Brushing across this with something such as a credit card, or anything similar, will help remove this and decrease the amount of the reaction. This same procedure will not help a wasp sting as they do not leave behind a stinger and poison sac.  Apply a cold compress for 10 to 20 minutes every hour for 1 to 2 days, depending on severity, to reduce swelling and itching.  To lessen pain, a paste made of water and baking soda may be rubbed on the bite or sting and left on for 5 minutes.  To relieve itching and swelling, you may use take medication or apply medicated creams or lotions as directed.  Only take over-the-counter or prescription medicines for pain, discomfort, or fever as directed by your caregiver.  Wash the sting site daily with soap and water. Apply antibiotic ointment on the sting site as directed.  If you suffered a severe  reaction:  If you did not require hospitalization, an adult will need to stay with you for 24 hours in case the symptoms return.  You may need to wear a medical bracelet or necklace stating the allergy.  You and your family need to learn when and how to use an anaphylaxis kit or epinephrine injection.  If you have had a severe reaction before, always carry your anaphylaxis kit with you. SEEK MEDICAL CARE IF:   None of the above helps within 2 to 3 days.  The area becomes red, warm, tender, and swollen beyond the area of the bite or sting.  You have an oral temperature above 102 F (38.9 C). SEEK IMMEDIATE MEDICAL CARE IF:  You have symptoms of an allergic reaction which are:  Wheezing.  Difficulty breathing.  Chest pain.  Lightheadedness or fainting.  Itchy, raised, red patches on the skin.  Nausea, vomiting, cramping or diarrhea. ANY OF THESE SYMPTOMS MAY REPRESENT A SERIOUS PROBLEM THAT IS AN EMERGENCY. Do not wait to see if the symptoms will go away. Get medical help right away. Call your local emergency services (911 in [place] DO NOT drive yourself to the hospital. MAKE SURE YOU:   Understand these instructions.  Will watch your condition.  Will get help right away if you are not doing well or get worse. Document Released: 01/20/2005 Document Revised: 04/14/2011 Document Reviewed: 07/07/2009 [facility] Patient Information 2015 [place], [place]. This information is not intended to replace advice given  to you by your health care provider. Make sure you discuss any questions you have with your health care provider.  Cellulitis Cellulitis is an infection of the skin and the tissue beneath it. The infected area is usually red and tender. Cellulitis occurs most often in the arms and lower legs.  CAUSES  Cellulitis is caused by bacteria that enter the skin through cracks or cuts in the skin. The most common types of bacteria that cause cellulitis are staphylococci and  streptococci. SIGNS AND SYMPTOMS   Redness and warmth.  Swelling.  Tenderness or pain.  Fever. DIAGNOSIS  Your health care provider can usually determine what is wrong based on a physical exam. Blood tests may also be done. TREATMENT  Treatment usually involves taking an antibiotic medicine. HOME CARE INSTRUCTIONS   Take your antibiotic medicine as directed by your health care provider. Finish the antibiotic even if you start to feel better.  Keep the infected arm or leg elevated to reduce swelling.  Apply a warm cloth to the affected area up to 4 times per day to relieve pain.  Take medicines only as directed by your health care provider.  Keep all follow-up visits as directed by your health care provider. SEEK MEDICAL CARE IF:   You notice red streaks coming from the infected area.  Your red area gets larger or turns dark in color.  Your bone or joint underneath the infected area becomes painful after the skin has healed.  Your infection returns in the same area or another area.  You notice a swollen bump in the infected area.  You develop new symptoms.  You have a fever. SEEK IMMEDIATE MEDICAL CARE IF:   You feel very sleepy.  You develop vomiting or diarrhea.  You have a general ill feeling (malaise) with muscle aches and pains. MAKE SURE YOU:   Understand these instructions.  Will watch your condition.  Will get help right away if you are not doing well or get worse. Document Released: 10/30/2004 Document Revised: 06/06/2013 Document Reviewed: 04/07/2011 [facility] Patient Information 2015 [place], [place]. This information is not intended to replace advice given to you by your health care provider. Make sure you discuss any questions you have with your health care provider.

## 2013-08-30 NOTE — ED Notes (Signed)
Pt was stung by yellow jackets approx 5 times on rt hand and lt thigh yesterday. Pt states the swelling and red streaks from the stings have her concerned and would like to be checked out.

## 2013-09-05 NOTE — ED Provider Notes (Signed)
CSN: [id number]     Arrival date & time 08/30/13  1813 History   First MD Initiated Contact with Patient 08/30/13 1946     Chief Complaint  Patient presents with  . Insect Bite     (Consider location/radiation/quality/duration/timing/severity/associated sxs/prior Treatment) HPI  73 year old female presenting after yellow jacket stings yesterday. She reports that she was stung 5 times. Right hand into bilateral thighs. She is presenting today be she's noticed some increasing redness extending up her right hand. Pain is tolerable. No fevers or chills. No drainage. No other acute complaints.  Past Medical History  Diagnosis Date  . Mixed hyperlipidemia   . Impaired fasting glucose   . Hypothyroidism   . GERD (gastroesophageal reflux disease)    Past Surgical History  Procedure Laterality Date  . Total abdominal hysterectomy w/ bilateral salpingoophorectomy    . Left lobe thyroidectomy     Family History  Problem Relation Age of Onset  . CAD Brother     2 brothers with CAD in their 52s  . CAD Father   . Heart failure Mother   . Diabetes Mellitus II Mother    History  Substance Use Topics  . Smoking status: Never Smoker   . Smokeless tobacco: Not on file  . Alcohol Use: No   OB History   Grav Para Term Preterm Abortions TAB SAB Ect Mult Living                 Review of Systems  All systems reviewed and negative, other than as noted in HPI.   Allergies  Review of patient's allergies indicates no known allergies.  Home Medications   Prior to Admission medications   Medication Sig Start Date End Date Taking? Authorizing Provider  acetaminophen (TYLENOL) 500 MG tablet Take 500-1,000 mg by mouth every 6 (six) hours as needed for mild pain or moderate pain.   Yes Historical Provider, MD  aspirin EC 81 MG tablet Take 81 mg by mouth at bedtime.   Yes Historical Provider, MD  calcium carbonate (OS-CAL) 600 MG TABS tablet Take 600 mg by mouth every morning.   Yes  Historical Provider, MD  Cholecalciferol (VITAMIN D PO) Take 1 tablet by mouth every morning.   Yes Historical Provider, MD  loratadine (CLARITIN) 10 MG tablet Take 10 mg by mouth every morning.    Yes Historical Provider, MD  omeprazole (PRILOSEC) 20 MG capsule Take 20 mg by mouth every morning.    Yes Historical Provider, MD  simvastatin (ZOCOR) 40 MG tablet Take 40 mg by mouth at bedtime.    Yes Historical Provider, MD  cephALEXin (KEFLEX) 500 MG capsule Take 1 capsule (500 mg total) by mouth 4 (four) times daily. 08/30/13   Virgel Manifold, MD  oxyCODONE-acetaminophen (PERCOCET/ROXICET) 5-325 MG per tablet Take 1 tablet by mouth every 4 (four) hours as needed for severe pain. 08/30/13   Virgel Manifold, MD   BP 170/73  Pulse 90  Temp(Src) 98 F (36.7 C) (Oral)  Resp 18  Ht 5\' 1"  (1.549 m)  Wt 174 lb (78.926 kg)  BMI 32.89 kg/m2  SpO2 93% Physical Exam  Nursing note and vitals reviewed. Constitutional: She appears well-developed and well-nourished. No distress.  HENT:  Head: Normocephalic and atraumatic.  Eyes: Conjunctivae are normal. Right eye exhibits no discharge. Left eye exhibits no discharge.  Neck: Neck supple.  Cardiovascular: Normal rate, regular rhythm and normal heart sounds.  Exam reveals no gallop and no friction rub.   No murmur  heard. Pulmonary/Chest: Effort normal and breath sounds normal. No respiratory distress.  Abdominal: Soft. She exhibits no distension. There is no tenderness.  Musculoskeletal: She exhibits no edema and no tenderness.  Neurological: She is alert.  Skin: Skin is warm and dry.  Erythematous lesion to right thumb consistent with described history by mouth jacket sting. There is some streaking erythema extending proximally up the hand and along the volar aspect of the right forearm. No abscess. Additional lesions consistent with the other jacket stings to both right and left thighs. Several centimeters of surrounding erythema and perhaps some mild  induration around the one on her left side. Again, no evidence of abscess.  Psychiatric: She has a normal mood and affect. Her behavior is normal. Thought content normal.    ED Course  Procedures (including critical care time) Labs Review Labs Reviewed - No data to display  Imaging Review No results found.   EKG Interpretation None      MDM   Final diagnoses:  Yellow jacket sting, accidental or unintentional, initial encounter  Cellulitis, unspecified cellulitis site, unspecified extremity site, unspecified laterality    73 year old female who appears to be developing some cellulitis after hymenoptera sting yesterday. As needed pain medication. Course of cephalexin. Return precautions were discussed.   Virgel Manifold, MD 09/05/13 1137

## 2013-09-29 DIAGNOSIS — J309 Allergic rhinitis, unspecified: Secondary | ICD-10-CM | POA: Diagnosis not present

## 2013-09-29 DIAGNOSIS — H1045 Other chronic allergic conjunctivitis: Secondary | ICD-10-CM | POA: Diagnosis not present

## 2013-09-29 DIAGNOSIS — R21 Rash and other nonspecific skin eruption: Secondary | ICD-10-CM | POA: Diagnosis not present

## 2013-11-22 DIAGNOSIS — Z23 Encounter for immunization: Secondary | ICD-10-CM | POA: Diagnosis not present

## 2014-02-07 DIAGNOSIS — D225 Melanocytic nevi of trunk: Secondary | ICD-10-CM | POA: Diagnosis not present

## 2014-02-07 DIAGNOSIS — L820 Inflamed seborrheic keratosis: Secondary | ICD-10-CM | POA: Diagnosis not present

## 2014-02-14 DIAGNOSIS — H2513 Age-related nuclear cataract, bilateral: Secondary | ICD-10-CM | POA: Diagnosis not present

## 2014-02-22 ENCOUNTER — Other Ambulatory Visit (HOSPITAL_COMMUNITY): Payer: Self-pay | Admitting: Internal Medicine

## 2014-02-22 DIAGNOSIS — Z1231 Encounter for screening mammogram for malignant neoplasm of breast: Secondary | ICD-10-CM

## 2014-03-01 ENCOUNTER — Ambulatory Visit (HOSPITAL_COMMUNITY)
Admission: RE | Admit: 2014-03-01 | Discharge: 2014-03-01 | Disposition: A | Discharge status: Home / Self Care | Payer: Medicare Other | Source: Ambulatory Visit | Attending: Internal Medicine | Admitting: Internal Medicine

## 2014-03-01 DIAGNOSIS — Z79899 Other long term (current) drug therapy: Secondary | ICD-10-CM | POA: Diagnosis not present

## 2014-03-01 DIAGNOSIS — Z1231 Encounter for screening mammogram for malignant neoplasm of breast: Secondary | ICD-10-CM | POA: Insufficient documentation

## 2014-03-01 DIAGNOSIS — R7301 Impaired fasting glucose: Secondary | ICD-10-CM | POA: Diagnosis not present

## 2014-03-01 DIAGNOSIS — E039 Hypothyroidism, unspecified: Secondary | ICD-10-CM | POA: Diagnosis not present

## 2014-03-01 DIAGNOSIS — E785 Hyperlipidemia, unspecified: Secondary | ICD-10-CM | POA: Diagnosis not present

## 2014-03-01 DIAGNOSIS — N19 Unspecified kidney failure: Secondary | ICD-10-CM | POA: Diagnosis not present

## 2014-03-06 DIAGNOSIS — I700 Atherosclerosis of aorta: Secondary | ICD-10-CM | POA: Diagnosis not present

## 2014-03-06 DIAGNOSIS — E038 Other specified hypothyroidism: Secondary | ICD-10-CM | POA: Diagnosis not present

## 2014-03-06 DIAGNOSIS — E785 Hyperlipidemia, unspecified: Secondary | ICD-10-CM | POA: Diagnosis not present

## 2014-05-10 DIAGNOSIS — L820 Inflamed seborrheic keratosis: Secondary | ICD-10-CM | POA: Diagnosis not present

## 2014-05-10 DIAGNOSIS — L720 Epidermal cyst: Secondary | ICD-10-CM | POA: Diagnosis not present

## 2014-05-10 DIAGNOSIS — M795 Residual foreign body in soft tissue: Secondary | ICD-10-CM | POA: Diagnosis not present

## 2014-08-18 ENCOUNTER — Emergency Department (HOSPITAL_COMMUNITY)
Admission: EM | Admit: 2014-08-18 | Discharge: 2014-08-18 | Disposition: A | Discharge status: Home / Self Care | Payer: Medicare Other | Attending: Emergency Medicine | Admitting: Emergency Medicine

## 2014-08-18 ENCOUNTER — Encounter (HOSPITAL_COMMUNITY): Payer: Self-pay | Admitting: Emergency Medicine

## 2014-08-18 DIAGNOSIS — K219 Gastro-esophageal reflux disease without esophagitis: Secondary | ICD-10-CM | POA: Insufficient documentation

## 2014-08-18 DIAGNOSIS — E782 Mixed hyperlipidemia: Secondary | ICD-10-CM | POA: Insufficient documentation

## 2014-08-18 DIAGNOSIS — R000 Tachycardia, unspecified: Secondary | ICD-10-CM | POA: Diagnosis not present

## 2014-08-18 DIAGNOSIS — R110 Nausea: Secondary | ICD-10-CM | POA: Diagnosis not present

## 2014-08-18 DIAGNOSIS — X58XXXA Exposure to other specified factors, initial encounter: Secondary | ICD-10-CM | POA: Insufficient documentation

## 2014-08-18 DIAGNOSIS — Y9289 Other specified places as the place of occurrence of the external cause: Secondary | ICD-10-CM | POA: Insufficient documentation

## 2014-08-18 DIAGNOSIS — Z7982 Long term (current) use of aspirin: Secondary | ICD-10-CM | POA: Insufficient documentation

## 2014-08-18 DIAGNOSIS — Z792 Long term (current) use of antibiotics: Secondary | ICD-10-CM | POA: Diagnosis not present

## 2014-08-18 DIAGNOSIS — Z79899 Other long term (current) drug therapy: Secondary | ICD-10-CM | POA: Diagnosis not present

## 2014-08-18 DIAGNOSIS — Y9389 Activity, other specified: Secondary | ICD-10-CM | POA: Diagnosis not present

## 2014-08-18 DIAGNOSIS — Y998 Other external cause status: Secondary | ICD-10-CM | POA: Diagnosis not present

## 2014-08-18 DIAGNOSIS — T63441A Toxic effect of venom of bees, accidental (unintentional), initial encounter: Secondary | ICD-10-CM | POA: Diagnosis not present

## 2014-08-18 DIAGNOSIS — R42 Dizziness and giddiness: Secondary | ICD-10-CM | POA: Diagnosis present

## 2014-08-18 DIAGNOSIS — R0789 Other chest pain: Secondary | ICD-10-CM | POA: Diagnosis not present

## 2014-08-18 MED ORDER — LIDOCAINE HCL 2 % EX GEL
1.0000 "application " | Freq: Once | CUTANEOUS | Status: AC
  Administered 2014-08-18: 20:00:00 via TOPICAL
  Filled 2014-08-18: qty 10

## 2014-08-18 MED ORDER — IBUPROFEN 400 MG PO TABS
400.0000 mg | ORAL_TABLET | Freq: Once | ORAL | Status: AC
  Administered 2014-08-18: 400 mg via ORAL
  Filled 2014-08-18: qty 1

## 2014-08-18 MED ORDER — PREDNISONE 50 MG PO TABS
60.0000 mg | ORAL_TABLET | Freq: Once | ORAL | Status: AC
  Administered 2014-08-18: 60 mg via ORAL
  Filled 2014-08-18 (×2): qty 1

## 2014-08-18 MED ORDER — HYDROCODONE-ACETAMINOPHEN 5-325 MG PO TABS: 1.0000 | ORAL_TABLET | ORAL | Status: DC | PRN

## 2014-08-18 MED ORDER — FAMOTIDINE 20 MG PO TABS: 20.0000 mg | ORAL_TABLET | Freq: Two times a day (BID) | ORAL | Status: DC

## 2014-08-18 MED ORDER — HYDROMORPHONE HCL 1 MG/ML IJ SOLN
0.5000 mg | Freq: Once | INTRAMUSCULAR | Status: AC
  Administered 2014-08-18: 0.5 mg via INTRAVENOUS
  Filled 2014-08-18: qty 1

## 2014-08-18 MED ORDER — FAMOTIDINE IN NACL 20-0.9 MG/50ML-% IV SOLN
20.0000 mg | Freq: Once | INTRAVENOUS | Status: AC
  Administered 2014-08-18: 20 mg via INTRAVENOUS
  Filled 2014-08-18: qty 50

## 2014-08-18 MED ORDER — ONDANSETRON HCL 4 MG/2ML IJ SOLN
4.0000 mg | Freq: Once | INTRAMUSCULAR | Status: AC
  Administered 2014-08-18: 4 mg via INTRAVENOUS
  Filled 2014-08-18: qty 2

## 2014-08-18 NOTE — Discharge Instructions (Signed)
The pain medication will make you sleepy so do not drive or do any activity that may cause injury while taking the medication. Return as needed for worsening symptoms.

## 2014-08-18 NOTE — ED Provider Notes (Signed)
CSN: [id number]     Arrival date & time 08/18/14  1812 History   First MD Initiated Contact with Patient 08/18/14 1825     Chief Complaint  Patient presents with  . Insect Bite     (Consider location/radiation/quality/duration/timing/severity/associated sxs/prior Treatment) HPI Diane Torres is a 74 y.o. female who arrived to the ED via EMS after she was stung about 9 times by wasp. She does not have a hx of allergy to bee stings. Patient states that when the bees started stinging her she felt dizzy and had severe nausea. She complains of itching and burning to the areas where she was stung.  EMS administered Benadryl 50 mg. In route to the ED. Patient denies shortness of breath, difficulty swallowing or other problems on arrival to the ED.    Past Medical History  Diagnosis Date  . Mixed hyperlipidemia   . Impaired fasting glucose   . Hypothyroidism   . GERD (gastroesophageal reflux disease)    Past Surgical History  Procedure Laterality Date  . Total abdominal hysterectomy w/ bilateral salpingoophorectomy    . Left lobe thyroidectomy     Family History  Problem Relation Age of Onset  . CAD Brother     2 brothers with CAD in their 73s  . CAD Father   . Heart failure Mother   . Diabetes Mellitus II Mother    History  Substance Use Topics  . Smoking status: Never Smoker   . Smokeless tobacco: Not on file  . Alcohol Use: No   OB History    No data available     Review of Systems Negative except as stated in HPI   Allergies  Review of patient's allergies indicates no known allergies.  Home Medications   Prior to Admission medications   Medication Sig Start Date End Date Taking? Authorizing Provider  acetaminophen (TYLENOL) 500 MG tablet Take 500-1,000 mg by mouth every 6 (six) hours as needed for mild pain or moderate pain.    Historical Provider, MD  aspirin EC 81 MG tablet Take 81 mg by mouth at bedtime.    Historical Provider, MD  calcium carbonate (OS-CAL) 600  MG TABS tablet Take 600 mg by mouth every morning.    Historical Provider, MD  cephALEXin (KEFLEX) 500 MG capsule Take 1 capsule (500 mg total) by mouth 4 (four) times daily. 08/30/13   Virgel Manifold, MD  Cholecalciferol (VITAMIN D PO) Take 1 tablet by mouth every morning.    Historical Provider, MD  famotidine (PEPCID) 20 MG tablet Take 1 tablet (20 mg total) by mouth 2 (two) times daily. 08/18/14   Denica Web Bunnie Pion, NP  HYDROcodone-acetaminophen (NORCO/VICODIN) 5-325 MG per tablet Take 1 tablet by mouth every 4 (four) hours as needed. 08/18/14   Kacey Dysert Bunnie Pion, NP  loratadine (CLARITIN) 10 MG tablet Take 10 mg by mouth every morning.     Historical Provider, MD  omeprazole (PRILOSEC) 20 MG capsule Take 20 mg by mouth every morning.     Historical Provider, MD  simvastatin (ZOCOR) 40 MG tablet Take 40 mg by mouth at bedtime.     Historical Provider, MD   BP 119/43 mmHg  Pulse 80  Temp(Src) 98.1 F (36.7 C) (Oral)  Resp 18  Wt 174 lb (78.926 kg)  SpO2 93% Physical Exam  Constitutional: She is oriented to person, place, and time. She appears well-developed and well-nourished. No distress.  HENT:  Head: Normocephalic.  Mouth/Throat: Uvula is midline, oropharynx is clear and  moist and mucous membranes are normal.  Eyes: Conjunctivae and EOM are normal.  Neck: Neck supple.  Cardiovascular: Regular rhythm.  Tachycardia present.   Pulmonary/Chest: Effort normal. No respiratory distress. She has no wheezes.  Abdominal: Soft. There is no tenderness.  Musculoskeletal: Normal range of motion.  There are multiple red raised areas to the lower legs and some areas to the arms due to bee stings.   Neurological: She is alert and oriented to person, place, and time. No cranial nerve deficit.  Skin: Skin is warm and dry.  Psychiatric: She has a normal mood and affect. Her behavior is normal.  Nursing note and vitals reviewed.   ED Course  Procedures (including critical care time) O2 @ 2L [place] on arrival to  the ED, IV saline lock in place, once in exam room patient patient complained of nausea and then vomited. She was administered Pepcid 20 mg. IV, Zofran 4mg  IV. @ 1845 and then after the Zofran had taken effect Prednisone 60 mg. PO.   After patient feeling better O2 [place] removed and O2 SAT remains at 97%.    1930 Patient complained of severe pain to the areas where the stings occurred and request topical medication for pain. Lidocaine Gel applied and ibuprofen 400 mg PO given  2030 patient complains that pain is no better. Patient given Dilaudid 0.5 mg IV.  2040 pain improved. Patient sleeping. Will continue to observe. 2150 patient states she is feeling much better and ready to go home.      MDM  74 y.o. female with multiple bee stings stable for d/c after pain and n/v has resolved. She if feeling much better and no respiratory symptoms.  She will continue to take Pepcid, prednisone and Claritin PO. She will return for worsening symptoms.  Discussed with the patient and her family plan of care and all questioned fully answered.   Final diagnoses:  Bee sting reaction, accidental or unintentional, initial encounter        [facility], NP 08/18/14 2246  Davonna Belling, MD 08/19/14 0002

## 2014-08-18 NOTE — ED Notes (Signed)
Per EMS pt given 50mg  IV Benadryl in route.

## 2014-08-18 NOTE — ED Notes (Signed)
Applied cool washcloths to sting areas

## 2014-08-18 NOTE — ED Notes (Signed)
Pt vomited large amt of partially digested food after administration of IV Zofran

## 2014-08-18 NOTE — ED Notes (Signed)
O2 removed sat 97% on room air

## 2014-08-18 NOTE — ED Notes (Addendum)
PER EMS pt stung apprx 9 times by wasp. Pt has not hx of allergic reaction. Pt reports on complaints at this time is burning and itching at sites. NAD noted.

## 2014-08-21 DIAGNOSIS — T63441A Toxic effect of venom of bees, accidental (unintentional), initial encounter: Secondary | ICD-10-CM | POA: Diagnosis not present

## 2014-08-21 DIAGNOSIS — Z6833 Body mass index (BMI) 33.0-33.9, adult: Secondary | ICD-10-CM | POA: Diagnosis not present

## 2014-11-06 DIAGNOSIS — Z23 Encounter for immunization: Secondary | ICD-10-CM | POA: Diagnosis not present

## 2014-12-04 DIAGNOSIS — Z23 Encounter for immunization: Secondary | ICD-10-CM | POA: Diagnosis not present

## 2015-02-06 DIAGNOSIS — L820 Inflamed seborrheic keratosis: Secondary | ICD-10-CM | POA: Diagnosis not present

## 2015-02-06 DIAGNOSIS — L821 Other seborrheic keratosis: Secondary | ICD-10-CM | POA: Diagnosis not present

## 2015-02-16 ENCOUNTER — Other Ambulatory Visit (HOSPITAL_COMMUNITY): Payer: Self-pay | Admitting: Internal Medicine

## 2015-02-16 DIAGNOSIS — Z1231 Encounter for screening mammogram for malignant neoplasm of breast: Secondary | ICD-10-CM

## 2015-02-28 DIAGNOSIS — Z79899 Other long term (current) drug therapy: Secondary | ICD-10-CM | POA: Diagnosis not present

## 2015-02-28 DIAGNOSIS — R7301 Impaired fasting glucose: Secondary | ICD-10-CM | POA: Diagnosis not present

## 2015-02-28 DIAGNOSIS — E785 Hyperlipidemia, unspecified: Secondary | ICD-10-CM | POA: Diagnosis not present

## 2015-02-28 DIAGNOSIS — N183 Chronic kidney disease, stage 3 (moderate): Secondary | ICD-10-CM | POA: Diagnosis not present

## 2015-02-28 DIAGNOSIS — E039 Hypothyroidism, unspecified: Secondary | ICD-10-CM | POA: Diagnosis not present

## 2015-03-05 ENCOUNTER — Ambulatory Visit (HOSPITAL_COMMUNITY)
Admission: RE | Admit: 2015-03-05 | Discharge: 2015-03-05 | Disposition: A | Discharge status: Home / Self Care | Payer: Medicare Other | Source: Ambulatory Visit | Attending: Internal Medicine | Admitting: Internal Medicine

## 2015-03-05 DIAGNOSIS — Z1231 Encounter for screening mammogram for malignant neoplasm of breast: Secondary | ICD-10-CM | POA: Insufficient documentation

## 2015-03-08 DIAGNOSIS — Z6833 Body mass index (BMI) 33.0-33.9, adult: Secondary | ICD-10-CM | POA: Diagnosis not present

## 2015-03-08 DIAGNOSIS — R7301 Impaired fasting glucose: Secondary | ICD-10-CM | POA: Diagnosis not present

## 2015-03-08 DIAGNOSIS — E785 Hyperlipidemia, unspecified: Secondary | ICD-10-CM | POA: Diagnosis not present

## 2015-03-08 DIAGNOSIS — Z23 Encounter for immunization: Secondary | ICD-10-CM | POA: Diagnosis not present

## 2015-03-08 DIAGNOSIS — I700 Atherosclerosis of aorta: Secondary | ICD-10-CM | POA: Diagnosis not present

## 2015-07-27 IMAGING — MG MM DIGITAL SCREENING
4 series · 4 of 4 positions shown · non-contrast
Comparison: Previous exam(s).

CLINICAL DATA: Screening.

EXAM:
DIGITAL SCREENING BILATERAL MAMMOGRAM WITH CAD

[L CC]
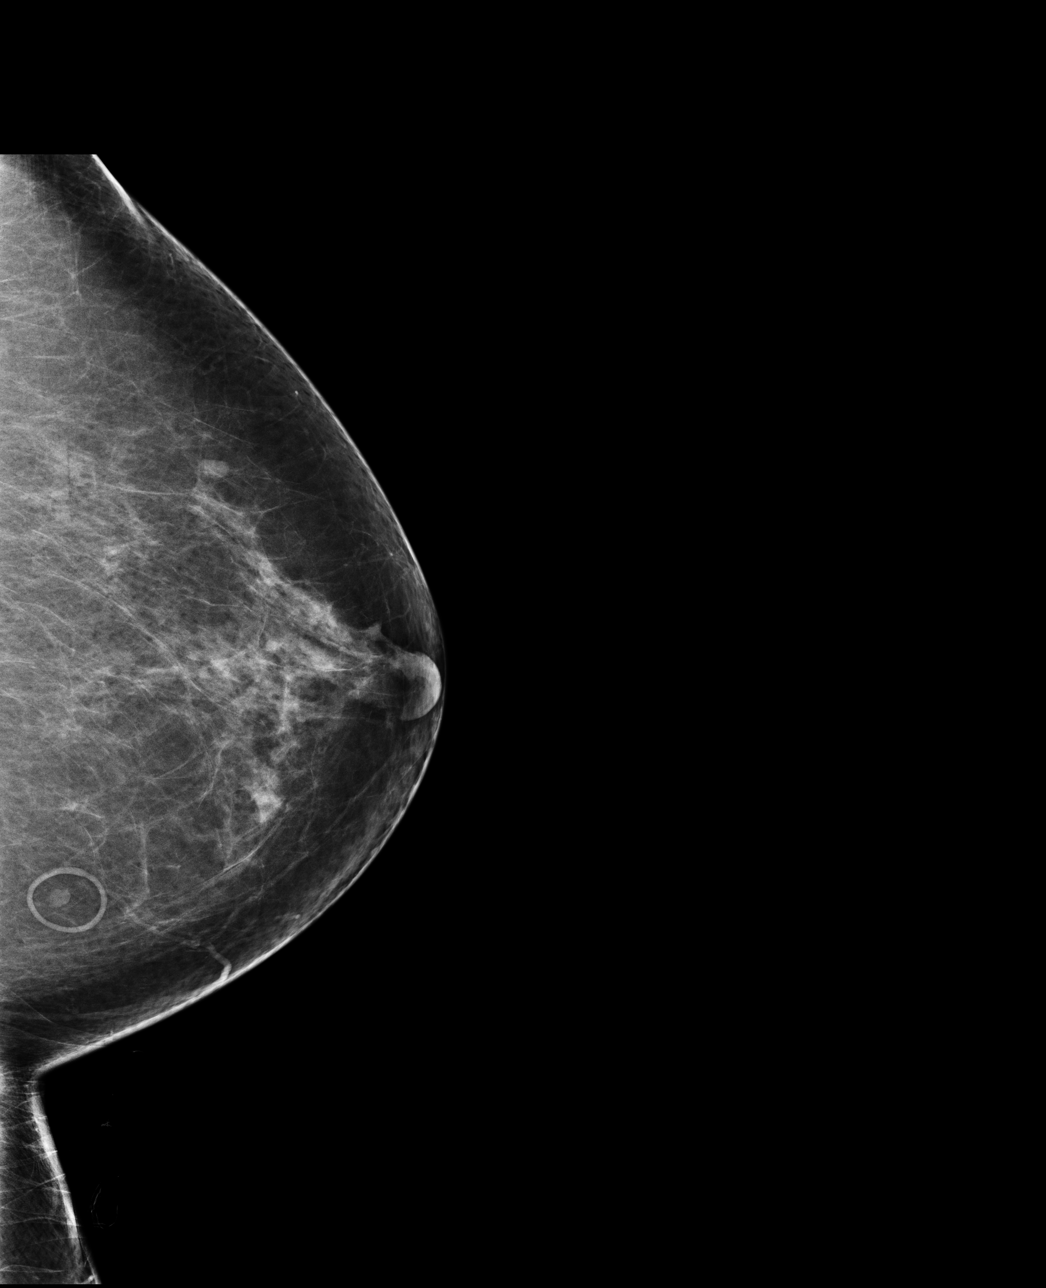

[L MLO]
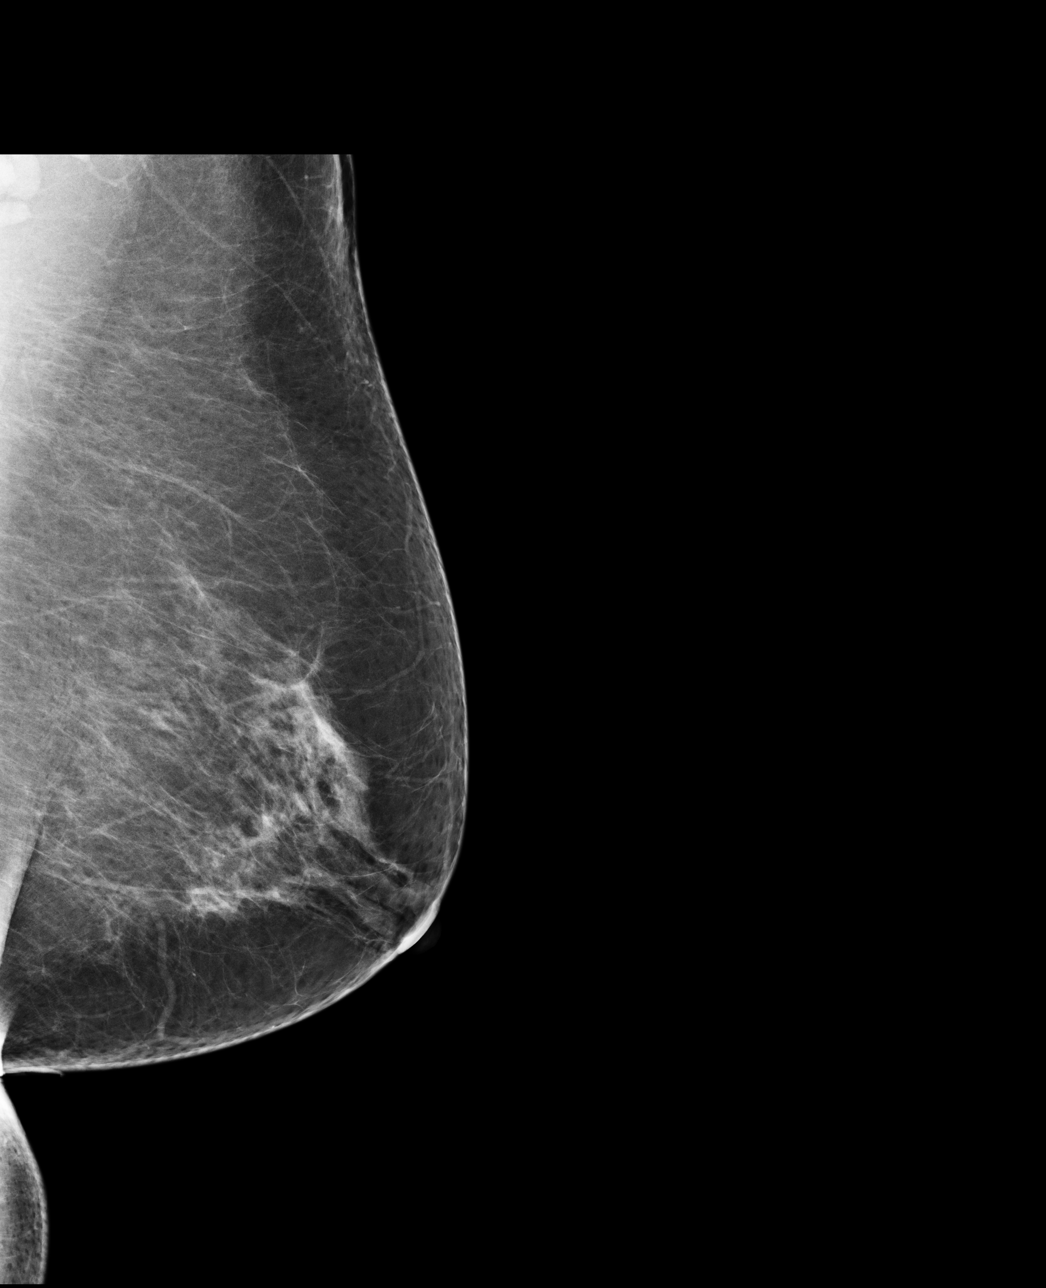

[R CC]
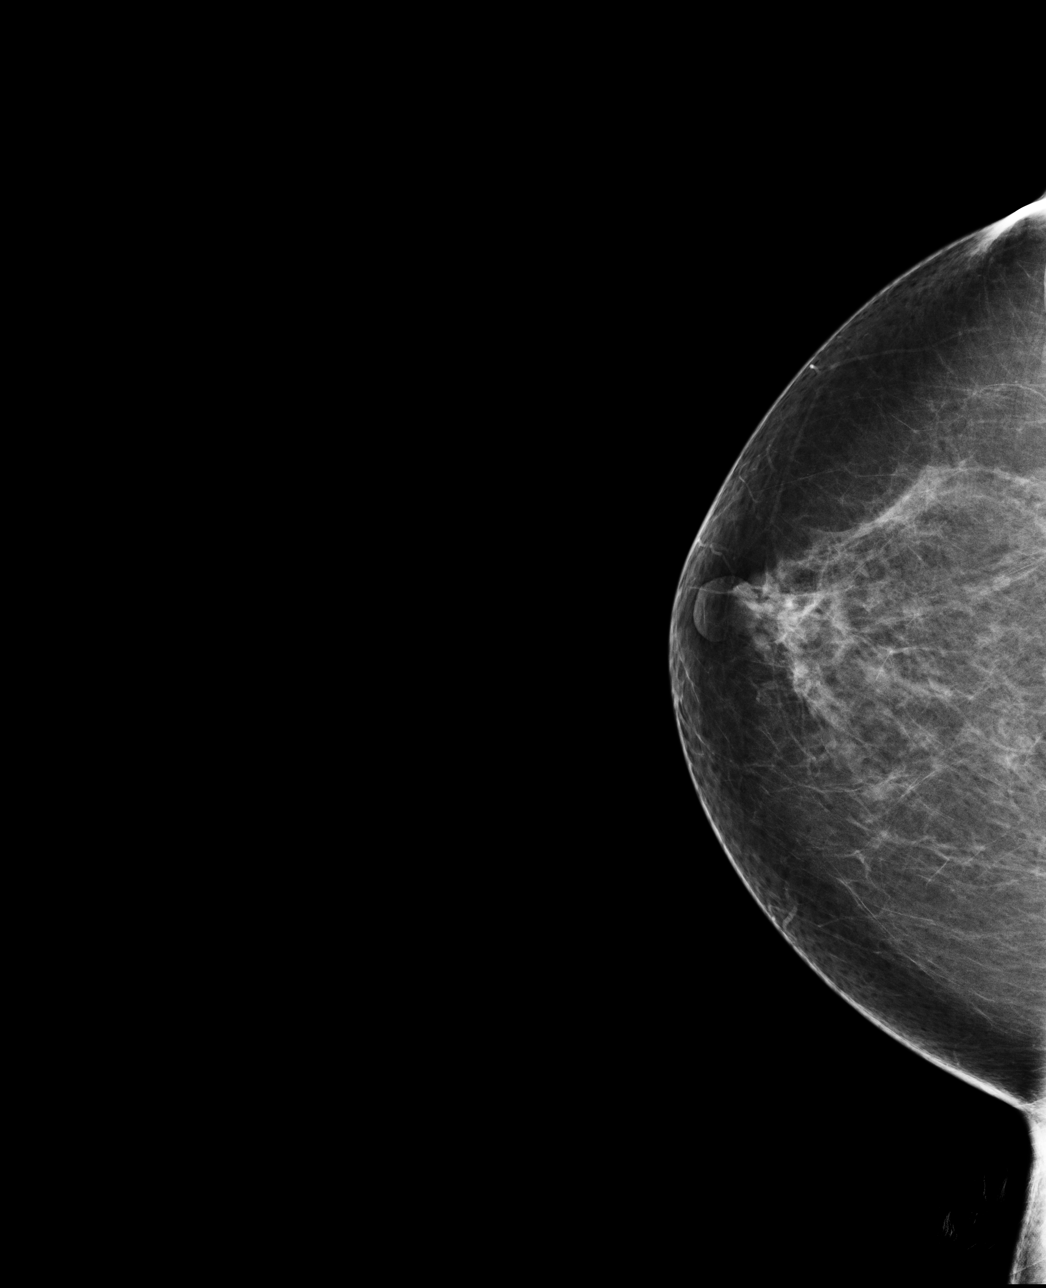

[R MLO]
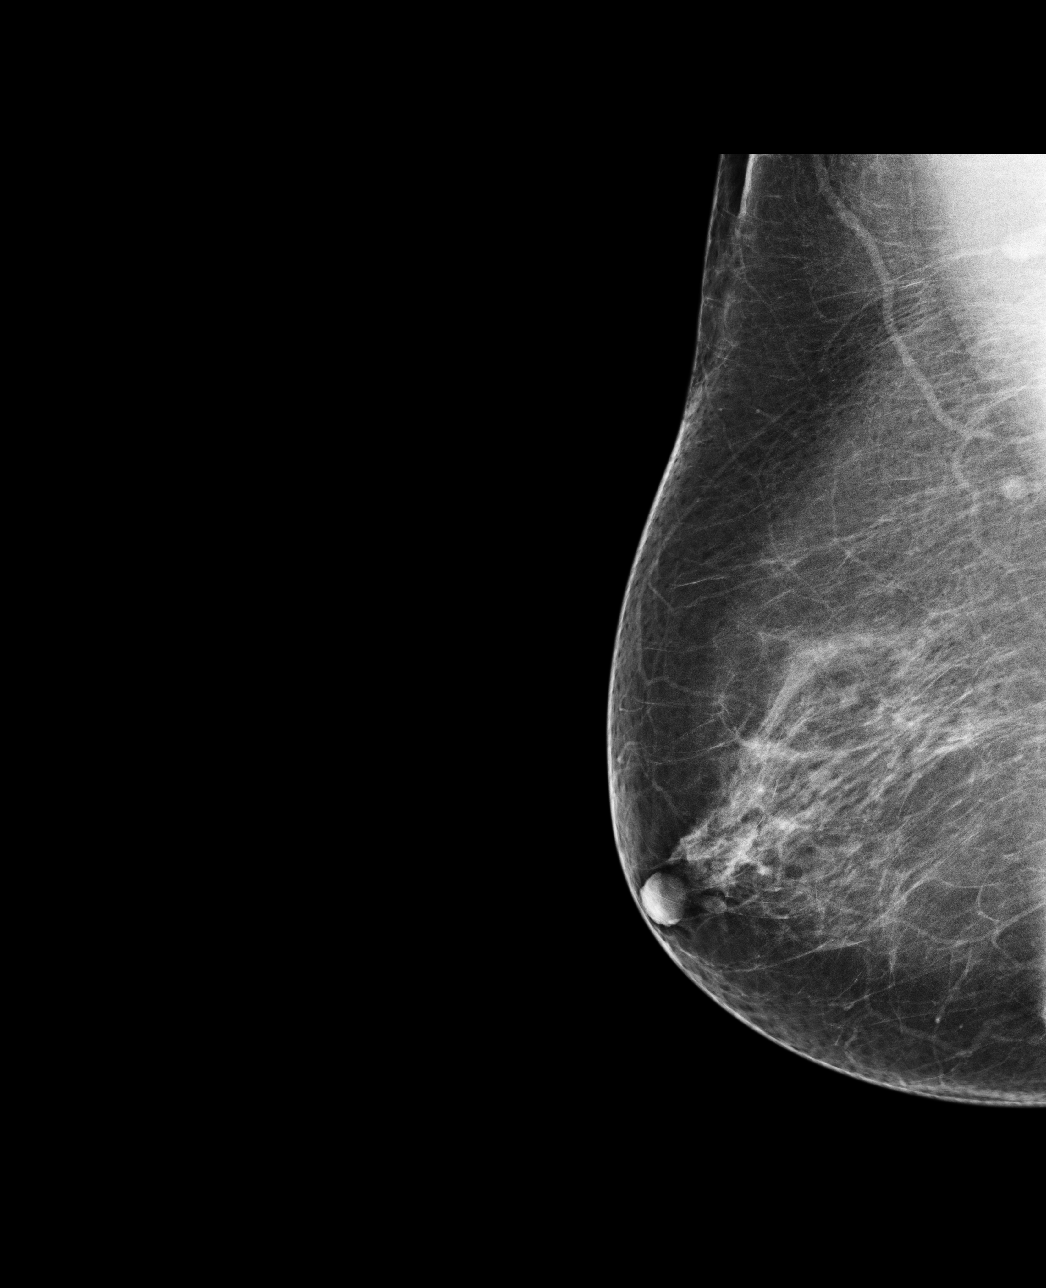

[4 of 4 positions shown; findings below may reference images not displayed]

ACR Breast Density Category b: There are scattered areas of
fibroglandular density.
FINDINGS: There are no findings suspicious for malignancy. Images were
processed with CAD.
IMPRESSION: No mammographic evidence of malignancy. A result letter of this
screening mammogram will be mailed directly to the patient.

RECOMMENDATION:
Screening mammogram in one year. (Code:AS-G-LCT)

BI-RADS CATEGORY  1: Negative.

## 2015-11-08 DIAGNOSIS — Z23 Encounter for immunization: Secondary | ICD-10-CM | POA: Diagnosis not present

## 2015-11-21 ENCOUNTER — Telehealth: Payer: Self-pay

## 2015-11-21 NOTE — Telephone Encounter (Signed)
LMOM to call.

## 2015-11-21 NOTE — Telephone Encounter (Signed)
Patient called to say that her PCP had sent a referral on 10/2 and she hadn't heard from [place] yet. I told her that the triage nurse had mailed her a letter on 10/5 and verified the address. Patient said she hasn't received the letter. I told her that the triage nurse was out last week and she has right many to do, but I would let her know that she had called and she would be in touch with her. She said she may not be at home today, but I told her if not today, tomorrow. Please call [phone]

## 2015-11-22 ENCOUNTER — Telehealth: Payer: Self-pay

## 2015-11-22 NOTE — Telephone Encounter (Signed)
See separate triage.  

## 2015-11-22 NOTE — Telephone Encounter (Addendum)
Gastroenterology Pre-Procedure Review  Request Date: 11/22/2015 Requesting Physician: Dr. Willey Blade  PATIENT REVIEW QUESTIONS: The patient responded to the following health history questions as indicated:     Pt said her last colonoscopy was with [facility] in 08/2006. She has hemorrhoids and no problems at the present. Occasional diarrhea maybe a couple of times a week.   1. Diabetes Melitis: no 2. Joint replacements in the past 12 months: no 3. Major health problems in the past 3 months: no 4. Has an artificial valve or MVP: no 5. Has a defibrillator: no 6. Has been advised in past to take antibiotics in advance of a procedure like teeth cleaning: no 7. Family history of colon cancer: no  8. Alcohol Use: no 9. History of sleep apnea: no  10. History of coronary artery or other vascular stents placed within the last 12 months: no    MEDICATIONS & ALLERGIES:    Patient reports the following regarding taking any blood thinners:   Plavix? no Aspirin? YES Coumadin? no Brilinta? no Xarelto? no Eliquis? no Pradaxa? no Savaysa? no Effient? no  Patient confirms/reports the following medications:  Current Outpatient Prescriptions  Medication Sig Dispense Refill  . acetaminophen (TYLENOL) 500 MG tablet Take 500-1,000 mg by mouth every 6 (six) hours as needed for mild pain or moderate pain.    Marland Kitchen aspirin EC 81 MG tablet Take 81 mg by mouth at bedtime.    Marland Kitchen loratadine (CLARITIN) 10 MG tablet Take 10 mg by mouth every morning.     Marland Kitchen omeprazole (PRILOSEC) 20 MG capsule Take 20 mg by mouth every morning.     . simvastatin (ZOCOR) 40 MG tablet Take 40 mg by mouth at bedtime.      No current facility-administered medications for this visit.     Patient confirms/reports the following allergies:  No Known Allergies  No orders of the defined types were placed in this encounter.   AUTHORIZATION INFORMATION Primary Insurance:   ID #:  Group #:  Pre-Cert / Auth required:  Pre-Cert / Auth #:    Secondary Insurance:   ID #:   Group #:  Pre-Cert / Auth required: Pre-Cert / Auth #:   SCHEDULE INFORMATION: Procedure has been scheduled as follows:  Date:   12/12/2015            Time: 10:30 AM  Location: [facility] Short Stay  This Gastroenterology Pre-Precedure Review Form is being routed to the following provider(s): R. Garfield Cornea, MD

## 2015-11-22 NOTE — Telephone Encounter (Signed)
PT called back and told Manuela Schwartz that her last colonoscopy was in 2008 not 2010.

## 2015-11-26 NOTE — Telephone Encounter (Signed)
OK to schedule

## 2015-11-27 ENCOUNTER — Other Ambulatory Visit: Payer: Self-pay

## 2015-11-27 DIAGNOSIS — Z1211 Encounter for screening for malignant neoplasm of colon: Secondary | ICD-10-CM

## 2015-11-27 MED ORDER — NA SULFATE-K SULFATE-MG SULF 17.5-3.13-1.6 GM/177ML PO SOLN: 1.0000 | ORAL | 0 refills | Status: DC

## 2015-11-27 NOTE — Telephone Encounter (Signed)
Rx sent to the pharmacy and instructions mailed to pt.  

## 2015-12-04 DIAGNOSIS — M79673 Pain in unspecified foot: Secondary | ICD-10-CM | POA: Diagnosis not present

## 2015-12-04 DIAGNOSIS — M722 Plantar fascial fibromatosis: Secondary | ICD-10-CM | POA: Diagnosis not present

## 2015-12-05 ENCOUNTER — Telehealth: Payer: Self-pay

## 2015-12-05 NOTE — Telephone Encounter (Signed)
I called [employer] and spoke to [facility] . PA # [phone] for the screening colonoscopy as out patient at [facility].

## 2015-12-12 ENCOUNTER — Ambulatory Visit (HOSPITAL_COMMUNITY)
Admission: RE | Admit: 2015-12-12 | Discharge: 2015-12-12 | Disposition: A | Discharge status: Home / Self Care | Payer: Medicare Other | Source: Ambulatory Visit | Attending: Internal Medicine | Admitting: Internal Medicine

## 2015-12-12 ENCOUNTER — Encounter (HOSPITAL_COMMUNITY): Payer: Self-pay | Admitting: *Deleted

## 2015-12-12 ENCOUNTER — Encounter (HOSPITAL_COMMUNITY)
Admission: RE | Disposition: A | Discharge status: Home / Self Care | Payer: Self-pay | Source: Ambulatory Visit | Attending: Internal Medicine

## 2015-12-12 DIAGNOSIS — E782 Mixed hyperlipidemia: Secondary | ICD-10-CM | POA: Diagnosis not present

## 2015-12-12 DIAGNOSIS — K640 First degree hemorrhoids: Secondary | ICD-10-CM | POA: Insufficient documentation

## 2015-12-12 DIAGNOSIS — Z1211 Encounter for screening for malignant neoplasm of colon: Secondary | ICD-10-CM | POA: Diagnosis not present

## 2015-12-12 DIAGNOSIS — Z90710 Acquired absence of both cervix and uterus: Secondary | ICD-10-CM | POA: Insufficient documentation

## 2015-12-12 DIAGNOSIS — Z8249 Family history of ischemic heart disease and other diseases of the circulatory system: Secondary | ICD-10-CM | POA: Insufficient documentation

## 2015-12-12 DIAGNOSIS — K219 Gastro-esophageal reflux disease without esophagitis: Secondary | ICD-10-CM | POA: Insufficient documentation

## 2015-12-12 DIAGNOSIS — D128 Benign neoplasm of rectum: Secondary | ICD-10-CM | POA: Diagnosis not present

## 2015-12-12 DIAGNOSIS — R7301 Impaired fasting glucose: Secondary | ICD-10-CM | POA: Diagnosis not present

## 2015-12-12 DIAGNOSIS — K5730 Diverticulosis of large intestine without perforation or abscess without bleeding: Secondary | ICD-10-CM | POA: Insufficient documentation

## 2015-12-12 DIAGNOSIS — Z79899 Other long term (current) drug therapy: Secondary | ICD-10-CM | POA: Diagnosis not present

## 2015-12-12 DIAGNOSIS — Z833 Family history of diabetes mellitus: Secondary | ICD-10-CM | POA: Diagnosis not present

## 2015-12-12 DIAGNOSIS — Z7982 Long term (current) use of aspirin: Secondary | ICD-10-CM | POA: Diagnosis not present

## 2015-12-12 DIAGNOSIS — K621 Rectal polyp: Secondary | ICD-10-CM | POA: Insufficient documentation

## 2015-12-12 DIAGNOSIS — E890 Postprocedural hypothyroidism: Secondary | ICD-10-CM | POA: Insufficient documentation

## 2015-12-12 DIAGNOSIS — Z1212 Encounter for screening for malignant neoplasm of rectum: Secondary | ICD-10-CM

## 2015-12-12 HISTORY — PX: POLYPECTOMY: SHX5525

## 2015-12-12 HISTORY — PX: COLONOSCOPY: SHX5424

## 2015-12-12 SURGERY — COLONOSCOPY
Anesthesia: Moderate Sedation

## 2015-12-12 MED ORDER — MEPERIDINE HCL 100 MG/ML IJ SOLN
INTRAMUSCULAR | Status: DC | PRN
  Administered 2015-12-12 (×2): 25 mg via INTRAVENOUS

## 2015-12-12 MED ORDER — STERILE WATER FOR IRRIGATION IR SOLN
Status: DC | PRN
  Administered 2015-12-12: 10:00:00

## 2015-12-12 MED ORDER — SODIUM CHLORIDE 0.9 % IV SOLN
INTRAVENOUS | Status: DC
  Administered 2015-12-12: 10:00:00 via INTRAVENOUS

## 2015-12-12 MED ORDER — MIDAZOLAM HCL 5 MG/5ML IJ SOLN
INTRAMUSCULAR | Status: AC
  Filled 2015-12-12: qty 10

## 2015-12-12 MED ORDER — ONDANSETRON HCL 4 MG/2ML IJ SOLN
INTRAMUSCULAR | Status: AC
  Filled 2015-12-12: qty 2

## 2015-12-12 MED ORDER — ONDANSETRON HCL 4 MG/2ML IJ SOLN
INTRAMUSCULAR | Status: DC | PRN
  Administered 2015-12-12: 4 mg via INTRAVENOUS

## 2015-12-12 MED ORDER — MEPERIDINE HCL 100 MG/ML IJ SOLN
INTRAMUSCULAR | Status: AC
  Filled 2015-12-12: qty 2

## 2015-12-12 MED ORDER — MIDAZOLAM HCL 5 MG/5ML IJ SOLN
INTRAMUSCULAR | Status: DC | PRN
  Administered 2015-12-12: 2 mg via INTRAVENOUS
  Administered 2015-12-12 (×2): 1 mg via INTRAVENOUS

## 2015-12-12 NOTE — H&P (Signed)
@  [id number]   Primary Care Physician:  Asencion Noble, MD Primary Gastroenterologist:  Dr. Willey Blade  Pre-Procedure History & Physical: HPI:  Diane Torres is a 75 y.o. female here for screening colonoscopy. No bowel symptoms. No family history of colon cancer. Last colonoscopy 10 years ago.  Past Medical History:  Diagnosis Date  . GERD (gastroesophageal reflux disease)   . Hypothyroidism   . Impaired fasting glucose   . Mixed hyperlipidemia     Past Surgical History:  Procedure Laterality Date  . COLONOSCOPY    . Left lobe thyroidectomy    . RECTOCELE REPAIR    . TOTAL ABDOMINAL HYSTERECTOMY W/ BILATERAL SALPINGOOPHORECTOMY      Prior to Admission medications   Medication Sig Start Date End Date Taking? Authorizing Provider  aspirin EC 81 MG tablet Take 81 mg by mouth at bedtime.   Yes Historical Provider, MD  loratadine (CLARITIN) 10 MG tablet Take 10 mg by mouth every morning.    Yes Historical Provider, MD  omeprazole (PRILOSEC) 20 MG capsule Take 20 mg by mouth every morning.    Yes Historical Provider, MD  simvastatin (ZOCOR) 40 MG tablet Take 40 mg by mouth at bedtime.    Yes Historical Provider, MD    Allergies as of 11/27/2015  . (No Known Allergies)    Family History  Problem Relation Age of Onset  . CAD Brother     2 brothers with CAD in their 76s  . CAD Father   . Heart failure Mother   . Diabetes Mellitus II Mother     Social History   Social History  . Marital status: Married    Spouse name: N/A  . Number of children: N/A  . Years of education: N/A   Occupational History  . Not on file.   Social History Main Topics  . Smoking status: Never Smoker  . Smokeless tobacco: Never Used  . Alcohol use No  . Drug use: No  . Sexual activity: Not on file   Other Topics Concern  . Not on file   Social History Narrative  . No narrative on file    Review of Systems: See HPI, otherwise negative ROS  Physical Exam: BP (!) 154/68   Pulse 84   Temp 97.6  F (36.4 C) (Oral)   Resp 17   Ht 5\' 1"  (1.549 m)   Wt 174 lb (78.9 kg)   SpO2 98%   BMI 32.88 kg/m  General:   Alert,  Well-developed, well-nourished, pleasant and cooperative in NAD Neck:  Supple; no masses or thyromegaly. No significant cervical adenopathy. Lungs:  Clear throughout to auscultation.   No wheezes, crackles, or rhonchi. No acute distress. Heart:  Regular rate and rhythm; no murmurs, clicks, rubs,  or gallops. Abdomen: Non-distended, normal bowel sounds.  Soft and nontender without appreciable mass or hepatosplenomegaly.  Pulses:  Normal pulses noted. Extremities:  Without clubbing or edema.  Impression: Pleasant 75 year old lady here for average risk screening colonoscopy. The risks, benefits, limitations, alternatives and imponderables have been reviewed with the patient. Questions have been answered. All parties are agreeable.      Notice: This dictation was prepared with Dragon dictation along with smaller phrase technology. Any transcriptional errors that result from this process are unintentional and may not be corrected upon review.

## 2015-12-12 NOTE — Op Note (Signed)
[facility] Patient Name: Diane Torres Procedure Date: 12/12/2015 10:07 AM MRN: [id number] Date of Birth: 1940-11-06 Attending MD: Gennette Pac , MD CSN: [id number] Age: 75 Admit Type: Outpatient Procedure:                Colonoscopy with snare polypectomy Indications:              Screening for colorectal malignant neoplasm Providers:                Gennette Pac, MD, Criselda Peaches. Mathis Fare RN, RN,                            Birder Robson, Technician Referring MD:              Medicines:                Midazolam 4 mg IV, Meperidine 50 mg IV, Ondansetron                            4 mg IV Complications:            No immediate complications. Estimated Blood Loss:     Estimated blood loss was minimal. Procedure:                Pre-Anesthesia Assessment:                           - Prior to the procedure, a History and Physical                            was performed, and patient medications and                            allergies were reviewed. The patient's tolerance of                            previous anesthesia was also reviewed. The risks                            and benefits of the procedure and the sedation                            options and risks were discussed with the patient.                            All questions were answered, and informed consent                            was obtained. Prior Anticoagulants: The patient has                            taken no previous anticoagulant or antiplatelet                            agents. ASA Grade Assessment: II - A patient with  mild systemic disease. After reviewing the risks                            and benefits, the patient was deemed in                            satisfactory condition to undergo the procedure.                           After obtaining informed consent, the colonoscope                            was passed under direct vision. Throughout the                  procedure, the patient's blood pressure, pulse, and                            oxygen saturations were monitored continuously. The                            EC-[id number] ([id number]) scope was introduced through                            the anus and advanced to the the cecum, identified                            by appendiceal orifice and ileocecal valve. The                            colonoscopy was performed without difficulty. The                            patient tolerated the procedure well. The quality                            of the bowel preparation was. The colonoscopy was                            performed without difficulty. The patient tolerated                            the procedure well. The ileocecal valve,                            appendiceal orifice, and rectum were photographed. Scope In: 10:21:17 AM Scope Out: 10:32:26 AM Scope Withdrawal Time: 0 hours 6 minutes 24 seconds  Total Procedure Duration: 0 hours 11 minutes 9 seconds  Findings:      The perianal and digital rectal examinations were normal.      (1) 3 mm polyp was found in the rectum. The polyp was semi-pedunculated.       The polyp was removed with a cold snare. Resection and retrieval were       complete. Estimated blood loss was minimal.      Scattered small and large-mouthed  diverticula were found in the entire       colon.      Internal hemorrhoids were found during retroflexion. The hemorrhoids       were Grade I (internal hemorrhoids that do not prolapse). Anal papilla       present as well.      The exam was otherwise without abnormality on direct and retroflexion       views. Impression:               - One 3 mm polyp in the rectum, removed with a cold                            snare. Resected and retrieved.                           - Diverticulosis in the entire examined colon.                           - Internal hemorrhoids.                           - The examination  was otherwise normal on direct                            and retroflexion views. Moderate Sedation:      Moderate (conscious) sedation was administered by the endoscopy nurse       and supervised by the endoscopist. The following parameters were       monitored: oxygen saturation, heart rate, blood pressure, respiratory       rate, EKG, adequacy of pulmonary ventilation, and response to care.       Total physician intraservice time was 18 minutes. Recommendation:           - Patient has a contact number available for                            emergencies. The signs and symptoms of potential                            delayed complications were discussed with the                            patient. Return to normal activities tomorrow.                            Written discharge instructions were provided to the                            patient.                           - Resume previous diet.                           - Continue present medications.                           -  No repeat colonoscopy due to age.                           - Return to GI office PRN. Procedure Code(s):        --- Professional ---                           [phone], Colonoscopy, flexible; with removal of                            tumor(s), polyp(s), or other lesion(s) by snare                            technique                           99152, Moderate sedation services provided by the                            same physician or other qualified health care                            professional performing the diagnostic or                            therapeutic service that the sedation supports,                            requiring the presence of an independent trained                            observer to assist in the monitoring of the                            patient's level of consciousness and physiological                            status; initial 15 minutes of intraservice time,                             patient age 48 years or older Diagnosis Code(s):        --- Professional ---                           Z12.11, Encounter for screening for malignant                            neoplasm of colon                           K62.1, Rectal polyp                           K64.0, First degree hemorrhoids  K57.30, Diverticulosis of large intestine without                            perforation or abscess without bleeding CPT copyright 2016 [employer]. All rights reserved. The codes documented in this report are preliminary and upon coder review may  be revised to meet current compliance requirements. Gerrit Friends. Yazir Koerber, MD Gennette Pac, MD 12/12/2015 10:40:06 AM This report has been signed electronically. Number of Addenda: 0

## 2015-12-12 NOTE — Discharge Instructions (Addendum)
Colonoscopy Discharge Instructions  Read the instructions outlined below and refer to this sheet in the next few weeks. These discharge instructions provide you with general information on caring for yourself after you leave the hospital. Your doctor may also give you specific instructions. While your treatment has been planned according to the most current medical practices available, unavoidable complications occasionally occur. If you have any problems or questions after discharge, call Dr. Gala Romney at [phone]. ACTIVITY  You may resume your regular activity, but move at a slower pace for the next 24 hours.   Take frequent rest periods for the next 24 hours.   Walking will help get rid of the air and reduce the bloated feeling in your belly (abdomen).   No driving for 24 hours (because of the medicine (anesthesia) used during the test).    Do not sign any important legal documents or operate any machinery for 24 hours (because of the anesthesia used during the test).  NUTRITION  Drink plenty of fluids.   You may resume your normal diet as instructed by your doctor.   Begin with a light meal and progress to your normal diet. Heavy or fried foods are harder to digest and may make you feel sick to your stomach (nauseated).   Avoid alcoholic beverages for 24 hours or as instructed.  MEDICATIONS  You may resume your normal medications unless your doctor tells you otherwise.  WHAT YOU CAN EXPECT TODAY  Some feelings of bloating in the abdomen.   Passage of more gas than usual.   Spotting of blood in your stool or on the toilet paper.  IF YOU HAD POLYPS REMOVED DURING THE COLONOSCOPY:  No aspirin products for 7 days or as instructed.   No alcohol for 7 days or as instructed.   Eat a soft diet for the next 24 hours.  FINDING OUT THE RESULTS OF YOUR TEST Not all test results are available during your visit. If your test results are not back during the visit, make an appointment  with your caregiver to find out the results. Do not assume everything is normal if you have not heard from your caregiver or the medical facility. It is important for you to follow up on all of your test results.  SEEK IMMEDIATE MEDICAL ATTENTION IF:  You have more than a spotting of blood in your stool.   Your belly is swollen (abdominal distention).   You are nauseated or vomiting.   You have a temperature over 101.   You have abdominal pain or discomfort that is severe or gets worse throughout the day.     Colon diverticulosis and polyp information provided  Further recommendations to follow pending review of pathology report      Diverticulosis Diverticulosis is the condition that develops when small pouches (diverticula) form in the wall of your colon. Your colon, or large intestine, is where water is absorbed and stool is formed. The pouches form when the inside layer of your colon pushes through weak spots in the outer layers of your colon. CAUSES  No one knows exactly what causes diverticulosis. RISK FACTORS  Being older than 40. Your risk for this condition increases with age. Diverticulosis is rare in people younger than 40 years. By age 19, almost everyone has it.  Eating a low-fiber diet.  Being frequently constipated.  Being overweight.  Not getting enough exercise.  Smoking.  Taking over-the-counter pain medicines, like aspirin and ibuprofen. SYMPTOMS  Most people with diverticulosis do  not have symptoms. DIAGNOSIS  Because diverticulosis often has no symptoms, health care providers often discover the condition during an exam for other colon problems. In many cases, a health care provider will diagnose diverticulosis while using a flexible scope to examine the colon (colonoscopy). TREATMENT  If you have never developed an infection related to diverticulosis, you may not need treatment. If you have had an infection before, treatment may include:  Eating  more fruits, vegetables, and grains.  Taking a fiber supplement.  Taking a live bacteria supplement (probiotic).  Taking medicine to relax your colon. HOME CARE INSTRUCTIONS   Drink at least 6-8 glasses of water each day to prevent constipation.  Try not to strain when you have a bowel movement.  Keep all follow-up appointments. If you have had an infection before:  Increase the fiber in your diet as directed by your health care provider or dietitian.  Take a dietary fiber supplement if your health care provider approves.  Only take medicines as directed by your health care provider. SEEK MEDICAL CARE IF:   You have abdominal pain.  You have bloating.  You have cramps.  You have not gone to the bathroom in 3 days. SEEK IMMEDIATE MEDICAL CARE IF:   Your pain gets worse.  Yourbloating becomes very bad.  You have a fever or chills, and your symptoms suddenly get worse.  You begin vomiting.  You have bowel movements that are bloody or black. MAKE SURE YOU:  Understand these instructions.  Will watch your condition.  Will get help right away if you are not doing well or get worse.   This information is not intended to replace advice given to you by your health care provider. Make sure you discuss any questions you have with your health care provider.   Document Released: 10/18/2003 Document Revised: 01/25/2013 Document Reviewed: 12/15/2012 Elsevier Interactive Patient Education 2016 Else[employer] Inc[employer]   Colon Polyps Polyps are lumps of extra tissue growing inside the body. Polyps can grow in the large intestine (colon). Most colon polyps are noncancerous (benign). However, some colon polyps can become cancerous over time. Polyps that are larger than a pea may be harmful. To be safe, caregivers remove and test all polyps. CAUSES  Polyps form when mutations in the genes cause your cells to grow and divide even though no more tissue is needed. RISK FACTORS There are  a number of risk factors that can increase your chances of getting colon polyps. They include:  Being older than 50 years.  Family history of colon polyps or colon cancer.  Long-term colon diseases, such as colitis or Crohn disease.  Being overweight.  Smoking.  Being inactive.  Drinking too much alcohol. SYMPTOMS  Most small polyps do not cause symptoms. If symptoms are present, they may include:  Blood in the stool. The stool may look dark red or black.  Constipation or diarrhea that lasts longer than 1 week. DIAGNOSIS People often do not know they have polyps until their caregiver finds them during a regular checkup. Your caregiver can use 4 tests to check for polyps:  Digital rectal exam. The caregiver wears gloves and feels inside the rectum. This test would find polyps only in the rectum.  Barium enema. The caregiver puts a liquid called barium into your rectum before taking X-rays of your colon. Barium makes your colon look white. Polyps are dark, so they are easy to see in the X-ray pictures.  Sigmoidoscopy. A thin, flexible tube (sigmoidoscope) is placed  into your rectum. The sigmoidoscope has a light and tiny camera in it. The caregiver uses the sigmoidoscope to look at the last third of your colon.  Colonoscopy. This test is like sigmoidoscopy, but the caregiver looks at the entire colon. This is the most common method for finding and removing polyps. TREATMENT  Any polyps will be removed during a sigmoidoscopy or colonoscopy. The polyps are then tested for cancer. PREVENTION  To help lower your risk of getting more colon polyps:  Eat plenty of fruits and vegetables. Avoid eating fatty foods.  Do not smoke.  Avoid drinking alcohol.  Exercise every day.  Lose weight if recommended by your caregiver.  Eat plenty of calcium and folate. Foods that are rich in calcium include milk, cheese, and broccoli. Foods that are rich in folate include chickpeas, kidney beans,  and spinach. HOME CARE INSTRUCTIONS Keep all follow-up appointments as directed by your caregiver. You may need periodic exams to check for polyps. SEEK MEDICAL CARE IF: You notice bleeding during a bowel movement.   This information is not intended to replace advice given to you by your health care provider. Make sure you discuss any questions you have with your health care provider.   Document Released: 10/17/2003 Document Revised: 02/10/2014 Document Reviewed: 04/01/2011 Elsevier Interactive Patient Education 2016 Elsevier Inc.   High-Fiber Diet Fiber, also called dietary fiber, is a type of carbohydrate found in fruits, vegetables, whole grains, and beans. A high-fiber diet can have many health benefits. Your health care provider may recommend a high-fiber diet to help:  Prevent constipation. Fiber can make your bowel movements more regular.  Lower your cholesterol.  Relieve hemorrhoids, uncomplicated diverticulosis, or irritable bowel syndrome.  Prevent overeating as part of a weight-loss plan.  Prevent heart disease, type 2 diabetes, and certain cancers. WHAT IS MY PLAN? The recommended daily intake of fiber includes:  38 grams for men under age 59.  33 grams for men over age 63.  1 grams for women under age 48.  21 grams for women over age 69. You can get the recommended daily intake of dietary fiber by eating a variety of fruits, vegetables, grains, and beans. Your health care provider may also recommend a fiber supplement if it is not possible to get enough fiber through your diet. WHAT DO I NEED TO KNOW ABOUT A HIGH-FIBER DIET?  Fiber supplements have not been widely studied for their effectiveness, so it is better to get fiber through food sources.  Always check the fiber content on thenutrition facts label of any prepackaged food. Look for foods that contain at least 5 grams of fiber per serving.  Ask your dietitian if you have questions about specific foods  that are related to your condition, especially if those foods are not listed in the following section.  Increase your daily fiber consumption gradually. Increasing your intake of dietary fiber too quickly may cause bloating, cramping, or gas.  Drink plenty of water. Water helps you to digest fiber. WHAT FOODS CAN I EAT? Grains Whole-grain breads. Multigrain cereal. Oats and oatmeal. Brown rice. Barley. Bulgur wheat. [place]. Bran muffins. Popcorn. Rye wafer crackers. Vegetables Sweet potatoes. Spinach. Kale. Artichokes. Cabbage. Broccoli. Green peas. Carrots. Squash. Fruits Berries. Pears. Apples. Oranges. Avocados. Prunes and raisins. Dried figs. Meats and Other Protein Sources [place], kidney, pinto, and soy beans. Split peas. Lentils. Nuts and seeds. Dairy Fiber-fortified yogurt. Beverages Fiber-fortified soy milk. Fiber-fortified orange juice. Other Fiber bars. The items listed above may not be  a complete list of recommended foods or beverages. Contact your dietitian for more options. WHAT FOODS ARE NOT RECOMMENDED? Grains White bread. Pasta made with refined flour. White rice. Vegetables Fried potatoes. Canned vegetables. Well-cooked vegetables.  Fruits Fruit juice. Cooked, strained fruit. Meats and Other Protein Sources Fatty cuts of meat. Fried Sales executive or fried fish. Dairy Milk. Yogurt. Cream cheese. Sour cream. Beverages Soft drinks. Other Cakes and pastries. Butter and oils. The items listed above may not be a complete list of foods and beverages to avoid. Contact your dietitian for more information. WHAT ARE SOME TIPS FOR INCLUDING HIGH-FIBER FOODS IN MY DIET?  Eat a wide variety of high-fiber foods.  Make sure that half of all grains consumed each day are whole grains.  Replace breads and cereals made from refined flour or white flour with whole-grain breads and cereals.  Replace white rice with brown rice, bulgur wheat, or millet.  Start the day with a breakfast  that is high in fiber, such as a cereal that contains at least 5 grams of fiber per serving.  Use beans in place of meat in soups, salads, or pasta.  Eat high-fiber snacks, such as berries, raw vegetables, nuts, or popcorn.   This information is not intended to replace advice given to you by your health care provider. Make sure you discuss any questions you have with your health care provider.   Document Released: 01/20/2005 Document Revised: 02/10/2014 Document Reviewed: 07/05/2013 Elsevier Interactive Patient Education [employer].

## 2015-12-13 ENCOUNTER — Encounter: Payer: Self-pay | Admitting: Internal Medicine

## 2015-12-14 ENCOUNTER — Encounter (HOSPITAL_COMMUNITY): Payer: Self-pay | Admitting: Internal Medicine

## 2016-01-08 DIAGNOSIS — M79673 Pain in unspecified foot: Secondary | ICD-10-CM | POA: Diagnosis not present

## 2016-01-08 DIAGNOSIS — M722 Plantar fascial fibromatosis: Secondary | ICD-10-CM | POA: Diagnosis not present

## 2016-02-04 DIAGNOSIS — K5792 Diverticulitis of intestine, part unspecified, without perforation or abscess without bleeding: Secondary | ICD-10-CM

## 2016-02-04 HISTORY — DX: Diverticulitis of intestine, part unspecified, without perforation or abscess without bleeding: K57.92

## 2016-02-07 DIAGNOSIS — M722 Plantar fascial fibromatosis: Secondary | ICD-10-CM | POA: Diagnosis not present

## 2016-02-07 DIAGNOSIS — M79673 Pain in unspecified foot: Secondary | ICD-10-CM | POA: Diagnosis not present

## 2016-02-12 ENCOUNTER — Other Ambulatory Visit (HOSPITAL_COMMUNITY): Payer: Self-pay | Admitting: Internal Medicine

## 2016-02-12 DIAGNOSIS — Z1231 Encounter for screening mammogram for malignant neoplasm of breast: Secondary | ICD-10-CM

## 2016-02-13 DIAGNOSIS — D225 Melanocytic nevi of trunk: Secondary | ICD-10-CM | POA: Diagnosis not present

## 2016-02-13 DIAGNOSIS — L820 Inflamed seborrheic keratosis: Secondary | ICD-10-CM | POA: Diagnosis not present

## 2016-03-04 DIAGNOSIS — E039 Hypothyroidism, unspecified: Secondary | ICD-10-CM | POA: Diagnosis not present

## 2016-03-04 DIAGNOSIS — N189 Chronic kidney disease, unspecified: Secondary | ICD-10-CM | POA: Diagnosis not present

## 2016-03-04 DIAGNOSIS — Z79899 Other long term (current) drug therapy: Secondary | ICD-10-CM | POA: Diagnosis not present

## 2016-03-04 DIAGNOSIS — E119 Type 2 diabetes mellitus without complications: Secondary | ICD-10-CM | POA: Diagnosis not present

## 2016-03-04 DIAGNOSIS — E785 Hyperlipidemia, unspecified: Secondary | ICD-10-CM | POA: Diagnosis not present

## 2016-03-05 ENCOUNTER — Ambulatory Visit (HOSPITAL_COMMUNITY)
Admission: RE | Admit: 2016-03-05 | Discharge: 2016-03-05 | Disposition: A | Discharge status: Home / Self Care | Payer: Medicare Other | Source: Ambulatory Visit | Attending: Internal Medicine | Admitting: Internal Medicine

## 2016-03-05 DIAGNOSIS — Z1231 Encounter for screening mammogram for malignant neoplasm of breast: Secondary | ICD-10-CM | POA: Diagnosis present

## 2016-03-10 DIAGNOSIS — N183 Chronic kidney disease, stage 3 (moderate): Secondary | ICD-10-CM | POA: Diagnosis not present

## 2016-03-10 DIAGNOSIS — Z6833 Body mass index (BMI) 33.0-33.9, adult: Secondary | ICD-10-CM | POA: Diagnosis not present

## 2016-03-10 DIAGNOSIS — R7301 Impaired fasting glucose: Secondary | ICD-10-CM | POA: Diagnosis not present

## 2016-03-10 DIAGNOSIS — E785 Hyperlipidemia, unspecified: Secondary | ICD-10-CM | POA: Diagnosis not present

## 2016-03-11 ENCOUNTER — Encounter: Payer: Self-pay | Admitting: Internal Medicine

## 2016-03-18 ENCOUNTER — Ambulatory Visit (INDEPENDENT_AMBULATORY_CARE_PROVIDER_SITE_OTHER): Payer: Medicare Other | Admitting: Internal Medicine

## 2016-03-18 ENCOUNTER — Encounter: Payer: Self-pay | Admitting: Internal Medicine

## 2016-03-18 ENCOUNTER — Other Ambulatory Visit: Payer: Self-pay

## 2016-03-18 VITALS — BP 169/76 | HR 76 | Temp 97.5°F | Ht 61.0 in | Wt 171.8 lb

## 2016-03-18 DIAGNOSIS — K219 Gastro-esophageal reflux disease without esophagitis: Secondary | ICD-10-CM

## 2016-03-18 DIAGNOSIS — R1310 Dysphagia, unspecified: Secondary | ICD-10-CM

## 2016-03-18 DIAGNOSIS — R066 Hiccough: Secondary | ICD-10-CM | POA: Diagnosis not present

## 2016-03-18 DIAGNOSIS — R142 Eructation: Secondary | ICD-10-CM | POA: Diagnosis not present

## 2016-03-18 DIAGNOSIS — R1319 Other dysphagia: Secondary | ICD-10-CM

## 2016-03-18 NOTE — Patient Instructions (Signed)
Schedule a diagnostic EGD / with possible dilation - GERD and dysphagia  Conscious sedation  Call me with the medication you are taking  Further recommendation to follow

## 2016-03-18 NOTE — Progress Notes (Signed)
Primary Care Physician:  Asencion Noble, MD Primary Gastroenterologist:  Dr. Gala Romney  Pre-Procedure History & Physical: HPI:  Diane Torres is a 76 y.o. female here for evaluation of nearly a year of intermittent belching and esophageal dysphagia symptoms to solid food. Long history of GERD. Previously on Prilosec 20 mg daily. [place] [facility] [place] no longer provides a benefit for omeprazole. She is now on an over-the-counter acid blocker from [employer]. She is not sure the name but it may be an H2 blocker. She will check and get back to [place]. Does not perceive worsening reflux symptoms. Does have hiccups and belching largely when she is eating a meal. Has not had any vomiting. Denies weight loss. No melena or hematochezia. No prior EGD. Colonoscopy last fall demonstrated a small rectal polyp-removed and diverticulosis.  Past Medical History:  Diagnosis Date  . GERD (gastroesophageal reflux disease)   . Hypothyroidism   . Impaired fasting glucose   . Mixed hyperlipidemia     Past Surgical History:  Procedure Laterality Date  . COLONOSCOPY    . COLONOSCOPY N/A 12/12/2015   Procedure: COLONOSCOPY;  Surgeon: Daneil Dolin, MD;  Location: [facility];  Service: Endoscopy;  Laterality: N/A;  10:30  . Left lobe thyroidectomy    . POLYPECTOMY  12/12/2015   Procedure: POLYPECTOMY;  Surgeon: Daneil Dolin, MD;  Location: [facility];  Service: Endoscopy;;  colon  . RECTOCELE REPAIR    . TOTAL ABDOMINAL HYSTERECTOMY W/ BILATERAL SALPINGOOPHORECTOMY      Prior to Admission medications   Medication Sig Start Date End Date Taking? Authorizing Provider  aspirin EC 81 MG tablet Take 81 mg by mouth at bedtime.   Yes Historical Provider, MD  loratadine (CLARITIN) 10 MG tablet Take 10 mg by mouth every morning.    Yes Historical Provider, MD  omeprazole (PRILOSEC) 20 MG capsule Take 20 mg by mouth every morning.    Yes Historical Provider, MD  simvastatin (ZOCOR) 40 MG tablet Take 40  mg by mouth at bedtime.    Yes Historical Provider, MD    Allergies as of 03/18/2016 - Review Complete 03/18/2016  Allergen Reaction Noted  . Bee venom Anaphylaxis 03/18/2016    Family History  Problem Relation Age of Onset  . CAD Brother     2 brothers with CAD in their 59s  . CAD Father   . Heart failure Mother   . Diabetes Mellitus II Mother     Social History   Social History  . Marital status: Married    Spouse name: N/A  . Number of children: N/A  . Years of education: N/A   Occupational History  . Not on file.   Social History Main Topics  . Smoking status: Never Smoker  . Smokeless tobacco: Never Used  . Alcohol use No  . Drug use: No  . Sexual activity: Not on file   Other Topics Concern  . Not on file   Social History Narrative  . No narrative on file    Review of Systems: See HPI, otherwise negative ROS  Physical Exam: BP (!) 169/76   Pulse 76   Temp 97.5 F (36.4 C) (Oral)   Ht 5\' 1"  (1.549 m)   Wt 171 lb 12.8 oz (77.9 kg)   BMI 32.46 kg/m  General:   Alert,  , pleasant and cooperative in NAD;  Accompanied by her husband. Lungs:  Clear throughout to auscultation.   No wheezes, crackles,  or rhonchi. No acute distress. Heart:  Regular rate and rhythm; no murmurs, clicks, rubs,  or gallops. Abdomen: Non-distended, normal bowel sounds.  Soft and nontender without appreciable mass or hepatosplenomegaly.  Pulses:  Normal pulses noted. Extremities:  Without clubbing or edema.  Impression:  Pleasant 76 year old lady with hiccups/ belching and esophageal dysphagia. Insidiously progressive symptoms over the past year. Long-standing GERD. May not be on adequate acid suppression therapy these days. No prior look at her upper GI tract.  Particularly with dysphagia symptoms, she ought to go ahead and have an EGD in the near future. She may have poorly controlled reflux. A ring stricture or web not excluded. I doubt a malignant  process.  Recommendations:  I have recommended a diagnostic EGD in the near future. Conscious sedation.  The risks, benefits, limitations, alternatives and imponderables have been reviewed with the patient. Potential for esophageal dilation, biopsy, etc. have also been reviewed.  Questions have been answered. All parties agreeable.  Patient is to call and let us know what acid suppressing medication she is taking   Further recommendation to follow    Notice: This dictation was prepared with Dragon dictation along with smaller phrase technology. Any transcriptional errors that result from this process are unintentional and may not be corrected upon review.

## 2016-03-19 ENCOUNTER — Encounter (HOSPITAL_COMMUNITY)
Admission: RE | Disposition: A | Discharge status: Home / Self Care | Payer: Self-pay | Source: Ambulatory Visit | Attending: Internal Medicine

## 2016-03-19 ENCOUNTER — Encounter (HOSPITAL_COMMUNITY): Payer: Self-pay | Admitting: *Deleted

## 2016-03-19 ENCOUNTER — Ambulatory Visit (HOSPITAL_COMMUNITY)
Admission: RE | Admit: 2016-03-19 | Discharge: 2016-03-19 | Disposition: A | Discharge status: Home / Self Care | Payer: Medicare Other | Source: Ambulatory Visit | Attending: Internal Medicine | Admitting: Internal Medicine

## 2016-03-19 DIAGNOSIS — Z8249 Family history of ischemic heart disease and other diseases of the circulatory system: Secondary | ICD-10-CM | POA: Insufficient documentation

## 2016-03-19 DIAGNOSIS — Z9079 Acquired absence of other genital organ(s): Secondary | ICD-10-CM | POA: Insufficient documentation

## 2016-03-19 DIAGNOSIS — E890 Postprocedural hypothyroidism: Secondary | ICD-10-CM | POA: Diagnosis not present

## 2016-03-19 DIAGNOSIS — Z9889 Other specified postprocedural states: Secondary | ICD-10-CM | POA: Diagnosis not present

## 2016-03-19 DIAGNOSIS — E782 Mixed hyperlipidemia: Secondary | ICD-10-CM | POA: Insufficient documentation

## 2016-03-19 DIAGNOSIS — K209 Esophagitis, unspecified: Secondary | ICD-10-CM | POA: Diagnosis not present

## 2016-03-19 DIAGNOSIS — Z8719 Personal history of other diseases of the digestive system: Secondary | ICD-10-CM | POA: Diagnosis not present

## 2016-03-19 DIAGNOSIS — Z7982 Long term (current) use of aspirin: Secondary | ICD-10-CM | POA: Diagnosis not present

## 2016-03-19 DIAGNOSIS — R1314 Dysphagia, pharyngoesophageal phase: Secondary | ICD-10-CM | POA: Insufficient documentation

## 2016-03-19 DIAGNOSIS — Z90710 Acquired absence of both cervix and uterus: Secondary | ICD-10-CM | POA: Diagnosis not present

## 2016-03-19 DIAGNOSIS — Z87892 Personal history of anaphylaxis: Secondary | ICD-10-CM | POA: Diagnosis not present

## 2016-03-19 DIAGNOSIS — R1310 Dysphagia, unspecified: Secondary | ICD-10-CM

## 2016-03-19 DIAGNOSIS — Z91030 Bee allergy status: Secondary | ICD-10-CM | POA: Diagnosis not present

## 2016-03-19 DIAGNOSIS — Z90722 Acquired absence of ovaries, bilateral: Secondary | ICD-10-CM | POA: Insufficient documentation

## 2016-03-19 DIAGNOSIS — Z79899 Other long term (current) drug therapy: Secondary | ICD-10-CM | POA: Diagnosis not present

## 2016-03-19 DIAGNOSIS — Z833 Family history of diabetes mellitus: Secondary | ICD-10-CM | POA: Diagnosis not present

## 2016-03-19 DIAGNOSIS — K219 Gastro-esophageal reflux disease without esophagitis: Secondary | ICD-10-CM

## 2016-03-19 DIAGNOSIS — K210 Gastro-esophageal reflux disease with esophagitis: Secondary | ICD-10-CM | POA: Insufficient documentation

## 2016-03-19 HISTORY — PX: MALONEY DILATION: SHX5535

## 2016-03-19 HISTORY — PX: ESOPHAGOGASTRODUODENOSCOPY: SHX5428

## 2016-03-19 SURGERY — EGD (ESOPHAGOGASTRODUODENOSCOPY)
Anesthesia: Moderate Sedation

## 2016-03-19 MED ORDER — MEPERIDINE HCL 100 MG/ML IJ SOLN
INTRAMUSCULAR | Status: DC | PRN
  Administered 2016-03-19: 50 mg via INTRAVENOUS
  Administered 2016-03-19: 25 mg via INTRAVENOUS

## 2016-03-19 MED ORDER — ONDANSETRON HCL 4 MG/2ML IJ SOLN
INTRAMUSCULAR | Status: DC | PRN
  Administered 2016-03-19: 4 mg via INTRAVENOUS

## 2016-03-19 MED ORDER — MIDAZOLAM HCL 5 MG/5ML IJ SOLN
INTRAMUSCULAR | Status: AC
  Filled 2016-03-19: qty 10

## 2016-03-19 MED ORDER — SODIUM CHLORIDE 0.9 % IV SOLN
INTRAVENOUS | Status: DC
  Administered 2016-03-19: 14:00:00 via INTRAVENOUS

## 2016-03-19 MED ORDER — LIDOCAINE VISCOUS 2 % MT SOLN
OROMUCOSAL | Status: DC | PRN
  Administered 2016-03-19: 1 via OROMUCOSAL

## 2016-03-19 MED ORDER — MIDAZOLAM HCL 5 MG/5ML IJ SOLN
INTRAMUSCULAR | Status: DC | PRN
  Administered 2016-03-19 (×2): 2 mg via INTRAVENOUS
  Administered 2016-03-19: 1 mg via INTRAVENOUS

## 2016-03-19 MED ORDER — ONDANSETRON HCL 4 MG/2ML IJ SOLN
INTRAMUSCULAR | Status: AC
  Filled 2016-03-19: qty 2

## 2016-03-19 MED ORDER — LIDOCAINE VISCOUS 2 % MT SOLN
OROMUCOSAL | Status: AC
  Filled 2016-03-19: qty 15

## 2016-03-19 MED ORDER — MEPERIDINE HCL 100 MG/ML IJ SOLN
INTRAMUSCULAR | Status: AC
  Filled 2016-03-19: qty 2

## 2016-03-19 NOTE — Discharge Instructions (Addendum)
EGD Discharge instructions Please read the instructions outlined below and refer to this sheet in the next few weeks. These discharge instructions provide you with general information on caring for yourself after you leave the hospital. Your doctor may also give you specific instructions. While your treatment has been planned according to the most current medical practices available, unavoidable complications occasionally occur. If you have any problems or questions after discharge, please call your doctor. ACTIVITY  You may resume your regular activity but move at a slower pace for the next 24 hours.   Take frequent rest periods for the next 24 hours.   Walking will help expel (get rid of) the air and reduce the bloated feeling in your abdomen.   No driving for 24 hours (because of the anesthesia (medicine) used during the test).   You may shower.   Do not sign any important legal documents or operate any machinery for 24 hours (because of the anesthesia used during the test).  NUTRITION  Drink plenty of fluids.   You may resume your normal diet.   Begin with a light meal and progress to your normal diet.   Avoid alcoholic beverages for 24 hours or as instructed by your caregiver.  MEDICATIONS  You may resume your normal medications unless your caregiver tells you otherwise.  WHAT YOU CAN EXPECT TODAY  You may experience abdominal discomfort such as a feeling of fullness or gas pains.  FOLLOW-UP  Your doctor will discuss the results of your test with you.  SEEK IMMEDIATE MEDICAL ATTENTION IF ANY OF THE FOLLOWING OCCUR:  Excessive nausea (feeling sick to your stomach) and/or vomiting.   Severe abdominal pain and distention (swelling).   Trouble swallowing.   Temperature over 101 F (37.8 C).   Rectal bleeding or vomiting of blood.    GERD information provided  Begin Protonix 40 mg daily.  Stop ranitidine  Office visit with [place] in 3  months     Gastroesophageal Reflux Disease, Adult Normally, food travels down the esophagus and stays in the stomach to be digested. However, when a person has gastroesophageal reflux disease (GERD), food and stomach acid move back up into the esophagus. When this happens, the esophagus becomes sore and inflamed. Over time, GERD can create small holes (ulcers) in the lining of the esophagus. What are the causes? This condition is caused by a problem with the muscle between the esophagus and the stomach (lower esophageal sphincter, or LES). Normally, the LES muscle closes after food passes through the esophagus to the stomach. When the LES is weakened or abnormal, it does not close properly, and that allows food and stomach acid to go back up into the esophagus. The LES can be weakened by certain dietary substances, medicines, and medical conditions, including:  Tobacco use.  Pregnancy.  Having a hiatal hernia.  Heavy alcohol use.  Certain foods and beverages, such as coffee, chocolate, onions, and peppermint. What increases the risk? This condition is more likely to develop in:  People who have an increased body weight.  People who have connective tissue disorders.  People who use NSAID medicines. What are the signs or symptoms? Symptoms of this condition include:  Heartburn.  Difficult or painful swallowing.  The feeling of having a lump in the throat.  Abitter taste in the mouth.  Bad breath.  Having a large amount of saliva.  Having an upset or bloated stomach.  Belching.  Chest pain.  Shortness of breath or wheezing.  Ongoing (  chronic) cough or a night-time cough.  Wearing away of tooth enamel.  Weight loss. Different conditions can cause chest pain. Make sure to see your health care provider if you experience chest pain. How is this diagnosed? Your health care provider will take a medical history and perform a physical exam. To determine if you have mild  or severe GERD, your health care provider may also monitor how you respond to treatment. You may also have other tests, including:  An endoscopy toexamine your stomach and esophagus with a small camera.  A test thatmeasures the acidity level in your esophagus.  A test thatmeasures how much pressure is on your esophagus.  A barium swallow or modified barium swallow to show the shape, size, and functioning of your esophagus. How is this treated? The goal of treatment is to help relieve your symptoms and to prevent complications. Treatment for this condition may vary depending on how severe your symptoms are. Your health care provider may recommend:  Changes to your diet.  Medicine.  Surgery. Follow these instructions at home: Diet  Follow a diet as recommended by your health care provider. This may involve avoiding foods and drinks such as:  Coffee and tea (with or without caffeine).  Drinks that containalcohol.  Energy drinks and sports drinks.  Carbonated drinks or sodas.  Chocolate and cocoa.  Peppermint and mint flavorings.  Garlic and onions.  Horseradish.  Spicy and acidic foods, including peppers, chili powder, curry powder, vinegar, hot sauces, and barbecue sauce.  Citrus fruit juices and citrus fruits, such as oranges, lemons, and limes.  Tomato-based foods, such as red sauce, chili, salsa, and pizza with red sauce.  Fried and fatty foods, such as donuts, french fries, potato chips, and high-fat dressings.  High-fat meats, such as hot dogs and fatty cuts of red and white meats, such as rib eye steak, sausage, ham, and bacon.  High-fat dairy items, such as whole milk, butter, and cream cheese.  Eat small, frequent meals instead of large meals.  Avoid drinking large amounts of liquid with your meals.  Avoid eating meals during the 2-3 hours before bedtime.  Avoid lying down right after you eat.  Do not exercise right after you eat. General  instructions  Pay attention to any changes in your symptoms.  Take over-the-counter and prescription medicines only as told by your health care provider. Do not take aspirin, ibuprofen, or other NSAIDs unless your health care provider told you to do so.  Do not use any tobacco products, including cigarettes, chewing tobacco, and e-cigarettes. If you need help quitting, ask your health care provider.  Wear loose-fitting clothing. Do not wear anything tight around your waist that causes pressure on your abdomen.  Raise (elevate) the head of your bed 6 inches (15cm).  Try to reduce your stress, such as with yoga or meditation. If you need help reducing stress, ask your health care provider.  If you are overweight, reduce your weight to an amount that is healthy for you. Ask your health care provider for guidance about a safe weight loss goal.  Keep all follow-up visits as told by your health care provider. This is important. Contact a health care provider if:  You have new symptoms.  You have unexplained weight loss.  You have difficulty swallowing, or it hurts to swallow.  You have wheezing or a persistent cough.  Your symptoms do not improve with treatment.  You have a hoarse voice. Get help right away if:  You have pain in your arms, neck, jaw, teeth, or back.  You feel sweaty, dizzy, or light-headed.  You have chest pain or shortness of breath.  You vomit and your vomit looks like blood or coffee grounds.  You faint.  Your stool is bloody or black.  You cannot swallow, drink, or eat. This information is not intended to replace advice given to you by your health care provider. Make sure you discuss any questions you have with your health care provider. Document Released: 10/30/2004 Document Revised: 06/20/2015 Document Reviewed: 05/17/2014 Elsevier Interactive Patient Education  2017 [name].

## 2016-03-19 NOTE — Interval H&P Note (Signed)
History and Physical Interval Note:  03/19/2016 2:50 PM  DEONNA HEFFELFINGER  has presented today for surgery, with the diagnosis of GERD, dysphagia  The various methods of treatment have been discussed with the patient and family. After consideration of risks, benefits and other options for treatment, the patient has consented to  Procedure(s) with comments: ESOPHAGOGASTRODUODENOSCOPY (EGD) (N/A) - 3:00pm MALONEY DILATION (N/A) as a surgical intervention .  The patient's history has been reviewed, patient examined, no change in status, stable for surgery.  I have reviewed the patient's chart and labs.  Questions were answered to the patient's satisfaction.     Robert Rourk  No change since being seen in the office yesterday. EGD with ED as feasible/appropriate per plan.  The risks, benefits, limitations, alternatives and imponderables have been reviewed with the patient. Potential for esophageal dilation, biopsy, etc. have also been reviewed.  Questions have been answered. All parties agreeable.

## 2016-03-19 NOTE — H&P (View-Only) (Signed)
Primary Care Physician:  Asencion Noble, MD Primary Gastroenterologist:  Dr. Gala Romney  Pre-Procedure History & Physical: HPI:  Diane Torres is a 76 y.o. female here for evaluation of nearly a year of intermittent belching and esophageal dysphagia symptoms to solid food. Long history of GERD. Previously on Prilosec 20 mg daily. [place] [employer] [place] no longer provides a benefit for omeprazole. She is now on an over-the-counter acid blocker from [employer]. She is not sure the name but it may be an H2 blocker. She will check and get back to [place]. Does not perceive worsening reflux symptoms. Does have hiccups and belching largely when she is eating a meal. Has not had any vomiting. Denies weight loss. No melena or hematochezia. No prior EGD. Colonoscopy last fall demonstrated a small rectal polyp-removed and diverticulosis.  Past Medical History:  Diagnosis Date  . GERD (gastroesophageal reflux disease)   . Hypothyroidism   . Impaired fasting glucose   . Mixed hyperlipidemia     Past Surgical History:  Procedure Laterality Date  . COLONOSCOPY    . COLONOSCOPY N/A 12/12/2015   Procedure: COLONOSCOPY;  Surgeon: Daneil Dolin, MD;  Location: [facility];  Service: Endoscopy;  Laterality: N/A;  10:30  . Left lobe thyroidectomy    . POLYPECTOMY  12/12/2015   Procedure: POLYPECTOMY;  Surgeon: Daneil Dolin, MD;  Location: [facility];  Service: Endoscopy;;  colon  . RECTOCELE REPAIR    . TOTAL ABDOMINAL HYSTERECTOMY W/ BILATERAL SALPINGOOPHORECTOMY      Prior to Admission medications   Medication Sig Start Date End Date Taking? Authorizing Provider  aspirin EC 81 MG tablet Take 81 mg by mouth at bedtime.   Yes Historical Provider, MD  loratadine (CLARITIN) 10 MG tablet Take 10 mg by mouth every morning.    Yes Historical Provider, MD  omeprazole (PRILOSEC) 20 MG capsule Take 20 mg by mouth every morning.    Yes Historical Provider, MD  simvastatin (ZOCOR) 40 MG tablet Take 40  mg by mouth at bedtime.    Yes Historical Provider, MD    Allergies as of 03/18/2016 - Review Complete 03/18/2016  Allergen Reaction Noted  . Bee venom Anaphylaxis 03/18/2016    Family History  Problem Relation Age of Onset  . CAD Brother     2 brothers with CAD in their 29s  . CAD Father   . Heart failure Mother   . Diabetes Mellitus II Mother     Social History   Social History  . Marital status: Married    Spouse name: N/A  . Number of children: N/A  . Years of education: N/A   Occupational History  . Not on file.   Social History Main Topics  . Smoking status: Never Smoker  . Smokeless tobacco: Never Used  . Alcohol use No  . Drug use: No  . Sexual activity: Not on file   Other Topics Concern  . Not on file   Social History Narrative  . No narrative on file    Review of Systems: See HPI, otherwise negative ROS  Physical Exam: BP (!) 169/76   Pulse 76   Temp 97.5 F (36.4 C) (Oral)   Ht 5\' 1"  (1.549 m)   Wt 171 lb 12.8 oz (77.9 kg)   BMI 32.46 kg/m  General:   Alert,  , pleasant and cooperative in NAD;  Accompanied by her husband. Lungs:  Clear throughout to auscultation.   No wheezes, crackles,  or rhonchi. No acute distress. Heart:  Regular rate and rhythm; no murmurs, clicks, rubs,  or gallops. Abdomen: Non-distended, normal bowel sounds.  Soft and nontender without appreciable mass or hepatosplenomegaly.  Pulses:  Normal pulses noted. Extremities:  Without clubbing or edema.  Impression:  Pleasant 76 year old lady with hiccups/ belching and esophageal dysphagia. Insidiously progressive symptoms over the past year. Long-standing GERD. May not be on adequate acid suppression therapy these days. No prior look at her upper GI tract.  Particularly with dysphagia symptoms, she ought to go ahead and have an EGD in the near future. She may have poorly controlled reflux. A ring stricture or web not excluded. I doubt a malignant  process.  Recommendations:  I have recommended a diagnostic EGD in the near future. Conscious sedation.  The risks, benefits, limitations, alternatives and imponderables have been reviewed with the patient. Potential for esophageal dilation, biopsy, etc. have also been reviewed.  Questions have been answered. All parties agreeable.  Patient is to call and let us know what acid suppressing medication she is taking   Further recommendation to follow    Notice: This dictation was prepared with Dragon dictation along with smaller phrase technology. Any transcriptional errors that result from this process are unintentional and may not be corrected upon review.

## 2016-03-19 NOTE — Op Note (Signed)
[facility] Patient Name: Diane Torres Procedure Date: 03/19/2016 2:53 PM MRN: [id number] Date of Birth: February 07, 1940 Attending MD: Norvel Richards , MD CSN: [id number] Age: 76 Admit Type: Outpatient Procedure:                Upper GI endoscopy with [facility] dilation Indications:              Dysphagia, Suspected esophageal reflux Providers:                Norvel Richards, MD, Lurline Del, RN, Purcell Nails.                            [place], Merchant [employer] officer Referring MD:              Medicines:                Midazolam 5 mg IV, Meperidine 75 mg IV, Ondansetron                            4 mg IV Complications:            No immediate complications. Estimated Blood Loss:     Estimated blood loss: none. Procedure:                Pre-Anesthesia Assessment:                           - Prior to the procedure, a History and Physical                            was performed, and patient medications and                            allergies were reviewed. The patient's tolerance of                            previous anesthesia was also reviewed. The risks                            and benefits of the procedure and the sedation                            options and risks were discussed with the patient.                            All questions were answered, and informed consent                            was obtained. Prior Anticoagulants: The patient has                            taken no previous anticoagulant or antiplatelet                            agents. ASA Grade Assessment: II - A patient with  mild systemic disease. After reviewing the risks                            and benefits, the patient was deemed in                            satisfactory condition to undergo the procedure.                           After obtaining informed consent, the endoscope was                            passed under direct vision. Throughout the                             procedure, the patient's blood pressure, pulse, and                            oxygen saturations were monitored continuously. The                            [id number]) scope was introduced through the                            mouth, and advanced to the second part of duodenum.                            The upper GI endoscopy was accomplished without                            difficulty. The patient tolerated the procedure                            well. The upper GI endoscopy was accomplished                            without difficulty. Scope In: 3:05:11 PM Scope Out: 3:12:08 PM Total Procedure Duration: 0 hours 6 minutes 57 seconds  Findings:      LA Grade A (one or more mucosal breaks less than 5 mm, not extending       between tops of 2 mucosal folds) esophagitis was found 35 to 36 cm from       the incisors. No Barrett's epithelium seen.      The entire examined stomach was normal.      The duodenal bulb and second portion of the duodenum were normal. The       scope was withdrawn. Dilation was performed with a Maloney dilator with       mild resistance at 27 Fr. The dilation site was examined following       endoscope reinsertion and showed no change. Estimated blood loss: none. Impression:               - LA Grade A esophagitis. Dilated.                           -  Normal stomach.                           - Normal duodenal bulb and second portion of the                            duodenum.                           - No specimens collected. Moderate Sedation:      Moderate (conscious) sedation was personally administered by an       anesthesia professional. The following parameters were monitored: oxygen       saturation, heart rate, blood pressure, respiratory rate, EKG, adequacy       of pulmonary ventilation, and response to care. Total physician       intraservice time was 18 minutes.      Moderate (conscious) sedation was administered by the endoscopy nurse        and supervised by the endoscopist. The following parameters were       monitored: oxygen saturation, heart rate, blood pressure, respiratory       rate, EKG, adequacy of pulmonary ventilation, and response to care.       Total physician intraservice time was 19 minutes. Recommendation:           - Patient has a contact number available for                            emergencies. The signs and symptoms of potential                            delayed complications were discussed with the                            patient. Return to normal activities tomorrow.                            Written discharge instructions were provided to the                            patient.                           - Resume previous diet. Stop ranitidine                           - Use Protonix (pantoprazole) 40 mg PO daily.                           - Return to my office in 3 months.                           - Continue present medications. Procedure Code(s):        --- Professional ---                           [phone], Esophagogastroduodenoscopy, flexible,  transoral; diagnostic, including collection of                            specimen(s) by brushing or washing, when performed                            (separate procedure)                           43450, Dilation of esophagus, by unguided sound or                            bougie, single or multiple passes                           99152, Moderate sedation services provided by the                            same physician or other qualified health care                            professional performing the diagnostic or                            therapeutic service that the sedation supports,                            requiring the presence of an independent trained                            observer to assist in the monitoring of the                            patient's level of consciousness and physiological                             status; initial 15 minutes of intraservice time,                            patient age 65 years or older Diagnosis Code(s):        --- Professional ---                           K20.9, Esophagitis, unspecified                           R13.10, Dysphagia, unspecified CPT copyright 2016 American Medical Association. All rights reserved. The codes documented in this report are preliminary and upon coder review may  be revised to meet current compliance requirements. Cristopher Estimable. Rourk, MD Norvel Richards, MD 03/19/2016 3:24:18 PM This report has been signed electronically. Number of Addenda: 0

## 2016-03-25 ENCOUNTER — Telehealth: Payer: Self-pay | Admitting: *Deleted

## 2016-03-25 NOTE — Telephone Encounter (Signed)
Pt had an EGD last week, she said she has been constipated ever since. She was in a lot of pain over the weekend, took an otc laxative and  had several small, hard bms and now she feels better but is sore. She has not had a bm since Sunday. She is scared to take too much otc laxatives without asking first.   Pt wants to know what she can take to help get her bowels moving? She is aware that it will be tomorrow before I can call her back and she said that was ok.

## 2016-03-25 NOTE — Telephone Encounter (Signed)
[phone]  PLEASE CALL PATIENT, SHE HAD A PROCEDURE LAST WEEK AND STATED SHE HAS ONLY HAD 2 BOWEL MOVEMENTS SINCE THEN, WANTS TO KNOW IF THAT IS DUE TO HER MEDICATION CHANGE.

## 2016-03-26 ENCOUNTER — Encounter (HOSPITAL_COMMUNITY): Payer: Self-pay | Admitting: Internal Medicine

## 2016-03-26 NOTE — Telephone Encounter (Signed)
Pt is aware.  

## 2016-03-26 NOTE — Telephone Encounter (Signed)
Miralax 17 gms daily to every other day prn constipation

## 2016-06-16 ENCOUNTER — Ambulatory Visit (INDEPENDENT_AMBULATORY_CARE_PROVIDER_SITE_OTHER): Payer: Medicare Other | Admitting: Gastroenterology

## 2016-06-16 ENCOUNTER — Encounter: Payer: Self-pay | Admitting: Gastroenterology

## 2016-06-16 VITALS — BP 154/77 | HR 89 | Temp 98.3°F | Ht 61.0 in | Wt 174.0 lb

## 2016-06-16 DIAGNOSIS — K2100 Gastro-esophageal reflux disease with esophagitis, without bleeding: Secondary | ICD-10-CM | POA: Insufficient documentation

## 2016-06-16 DIAGNOSIS — K219 Gastro-esophageal reflux disease without esophagitis: Secondary | ICD-10-CM | POA: Insufficient documentation

## 2016-06-16 NOTE — Progress Notes (Signed)
Referring Provider: Asencion Noble, MD Primary Care Physician:  Asencion Noble, MD Primary GI: Dr. Gala Romney   Chief Complaint  Patient presents with  . Dysphagia    HPI:   Diane Torres is a 76 y.o. female presenting today with a history of esophagitis, dysphagia, recently undergoing EGD with LA Grade A esophagitis, s/p dilation. Normal stomach and duodenum.  Sometimes will have cramps prior to BM but then it resolves. Denies constipation. Felt constipated after the procedure but this has resolved. No rectal bleeding. Insurance quit paying for Prilosec. OTC Prilosec was not helpful. Protonix is working well for her.   Past Medical History:  Diagnosis Date  . GERD (gastroesophageal reflux disease)   . Hypothyroidism   . Impaired fasting glucose   . Mixed hyperlipidemia     Past Surgical History:  Procedure Laterality Date  . COLONOSCOPY    . COLONOSCOPY N/A 12/12/2015   Dr. Gala Romney: one 3 mm tubular adenoma in rectum s/p removal. Diverticulosis in entire colon. Internal hemorrhoids.   . ESOPHAGOGASTRODUODENOSCOPY N/A 03/19/2016   Dr. Gala Romney: LA Grade A esophagitis, empiric dilation, normal stomach, normal duodenum  . Left lobe thyroidectomy    . MALONEY DILATION N/A 03/19/2016   Procedure: Venia Minks DILATION;  Surgeon: Daneil Dolin, MD;  Location: [facility];  Service: Endoscopy;  Laterality: N/A;  . POLYPECTOMY  12/12/2015   Procedure: POLYPECTOMY;  Surgeon: Daneil Dolin, MD;  [facility];  Service: Endoscopy;;  colon  . RECTOCELE REPAIR    . TOTAL ABDOMINAL HYSTERECTOMY W/ BILATERAL SALPINGOOPHORECTOMY      Current Outpatient Prescriptions  Medication Sig Dispense Refill  . aspirin EC 81 MG tablet Take 81 mg by mouth at bedtime.    . diphenhydramine-acetaminophen (TYLENOL PM) 25-500 MG TABS tablet Take 1 tablet by mouth at bedtime as needed (RESTLESSNESS).    Marland Kitchen loratadine (CLARITIN) 10 MG tablet Take 10 mg by mouth every morning.     . pantoprazole (PROTONIX) 40 MG  tablet Take 40 mg by mouth daily.     . simvastatin (ZOCOR) 40 MG tablet Take 40 mg by mouth at bedtime.      No current facility-administered medications for this visit.     Allergies as of 06/16/2016 - Review Complete 06/16/2016  Allergen Reaction Noted  . Bee venom Anaphylaxis 03/18/2016    Family History  Problem Relation Age of Onset  . CAD Brother        2 brothers with CAD in their 24s  . CAD Father   . Heart failure Mother   . Diabetes Mellitus II Mother     Social History   Social History  . Marital status: Married    Spouse name: N/A  . Number of children: N/A  . Years of education: N/A   Social History Main Topics  . Smoking status: Never Smoker  . Smokeless tobacco: Never Used  . Alcohol use No  . Drug use: No  . Sexual activity: Not Asked   Other Topics Concern  . None   Social History Narrative  . None    Review of Systems: As mentioned in HPI   Physical Exam: BP (!) 154/77   Pulse 89   Temp 98.3 F (36.8 C) (Oral)   Ht 5\' 1"  (1.549 m)   Wt 174 lb (78.9 kg)   BMI 32.88 kg/m  General:   Alert and oriented. No distress noted. Pleasant and cooperative.  Head:  Normocephalic and atraumatic.  Eyes:  Conjuctiva clear without scleral icterus. Abdomen:  +BS, soft, non-tender and non-distended. No rebound or guarding. No HSM or masses noted. Msk:  Symmetrical without gross deformities. Normal posture. Extremities:  Without edema. Neurologic:  Alert and  oriented x4 Psych:  Alert and cooperative. Normal mood and affect.

## 2016-06-16 NOTE — Patient Instructions (Signed)
Continue taking Protonix once each morning, 30 minutes before breakfast.  We will see you in 6 months.  Let us know if you have any other concerning signs or symptoms!

## 2016-06-17 ENCOUNTER — Encounter: Payer: Self-pay | Admitting: Gastroenterology

## 2016-06-17 ENCOUNTER — Encounter: Payer: Self-pay | Admitting: Internal Medicine

## 2016-06-17 NOTE — Assessment & Plan Note (Signed)
76 year old female with history of GERD, dysphagia, now doing well s/p dilation. Continue with Protonix once daily and return in 6 months.

## 2016-06-17 NOTE — Progress Notes (Signed)
cc'ed to pcp °

## 2016-08-07 ENCOUNTER — Other Ambulatory Visit: Payer: Self-pay | Admitting: Internal Medicine

## 2016-11-21 DIAGNOSIS — Z23 Encounter for immunization: Secondary | ICD-10-CM | POA: Diagnosis not present

## 2016-12-23 ENCOUNTER — Ambulatory Visit (HOSPITAL_COMMUNITY)
Admission: RE | Admit: 2016-12-23 | Discharge: 2016-12-23 | Disposition: A | Discharge status: Home / Self Care | Payer: Medicare Other | Source: Ambulatory Visit | Attending: Gastroenterology | Admitting: Gastroenterology

## 2016-12-23 ENCOUNTER — Encounter: Payer: Self-pay | Admitting: Gastroenterology

## 2016-12-23 ENCOUNTER — Ambulatory Visit (INDEPENDENT_AMBULATORY_CARE_PROVIDER_SITE_OTHER): Payer: Medicare Other | Admitting: Gastroenterology

## 2016-12-23 ENCOUNTER — Other Ambulatory Visit (HOSPITAL_COMMUNITY)
Admission: RE | Admit: 2016-12-23 | Discharge: 2016-12-23 | Disposition: A | Discharge status: Home / Self Care | Payer: Medicare Other | Source: Ambulatory Visit | Attending: Gastroenterology | Admitting: Gastroenterology

## 2016-12-23 VITALS — BP 157/83 | HR 83 | Temp 96.4°F | Ht 61.0 in | Wt 169.4 lb

## 2016-12-23 DIAGNOSIS — R1032 Left lower quadrant pain: Secondary | ICD-10-CM

## 2016-12-23 DIAGNOSIS — K5732 Diverticulitis of large intestine without perforation or abscess without bleeding: Secondary | ICD-10-CM | POA: Insufficient documentation

## 2016-12-23 DIAGNOSIS — K760 Fatty (change of) liver, not elsewhere classified: Secondary | ICD-10-CM | POA: Insufficient documentation

## 2016-12-23 DIAGNOSIS — K2100 Gastro-esophageal reflux disease with esophagitis, without bleeding: Secondary | ICD-10-CM

## 2016-12-23 DIAGNOSIS — I700 Atherosclerosis of aorta: Secondary | ICD-10-CM | POA: Insufficient documentation

## 2016-12-23 LAB — BASIC METABOLIC PANEL
Anion gap: 11 (ref 5–15)
BUN: 17 mg/dL (ref 6–20)
CALCIUM: 9.3 mg/dL (ref 8.9–10.3)
CO2: 22 mmol/L (ref 22–32)
CREATININE: 1.2 mg/dL — AB (ref 0.44–1.00)
Chloride: 103 mmol/L (ref 101–111)
GFR calc non Af Amer: 42 mL/min — ABNORMAL LOW (ref >60)
GFR, EST AFRICAN AMERICAN: 49 mL/min — AB (ref >60)
Glucose, Bld: 96 mg/dL (ref 65–99)
Potassium: 4.3 mmol/L (ref 3.5–5.1)
SODIUM: 136 mmol/L (ref 135–145)

## 2016-12-23 MED ORDER — CIPROFLOXACIN HCL 500 MG PO TABS: 500.0000 mg | ORAL_TABLET | Freq: Two times a day (BID) | ORAL | 0 refills | Status: AC

## 2016-12-23 MED ORDER — IOPAMIDOL (ISOVUE-300) INJECTION 61%
80.0000 mL | Freq: Once | INTRAVENOUS | Status: AC | PRN
  Administered 2016-12-23: 80 mL via INTRAVENOUS

## 2016-12-23 MED ORDER — FLUCONAZOLE 150 MG PO TABS: 150.0000 mg | ORAL_TABLET | Freq: Every day | ORAL | 0 refills | Status: DC

## 2016-12-23 MED ORDER — IOPAMIDOL (ISOVUE-300) INJECTION 61%
INTRAVENOUS | Status: AC
  Administered 2016-12-23: 30 mL
  Filled 2016-12-23: qty 30

## 2016-12-23 MED ORDER — METRONIDAZOLE 500 MG PO TABS: 500.0000 mg | ORAL_TABLET | Freq: Three times a day (TID) | ORAL | 0 refills | Status: AC

## 2016-12-23 NOTE — Assessment & Plan Note (Signed)
76 year old female with LLQ for the past several weeks, waxing and waning in intensity. Similar but not as severe as diverticulitis presentation in 2015. Known history of diverticulosis, with colonoscopy on file from last year. Will empirically start Cipro and Flagyl now but also pursue CT for documentation and exclude other etiology. Low-residue diet for now. Signs/symptoms that would necessitate further evaluation discussed. Will await CT findings.

## 2016-12-23 NOTE — Assessment & Plan Note (Signed)
Doing well on Protonix once daily. Continue.

## 2016-12-23 NOTE — Progress Notes (Signed)
Referring Provider: Asencion Noble, MD Primary Care Physician:  Asencion Noble, MD Primary GI: Dr. Gala Romney   Chief Complaint  Patient presents with  . Abdominal Pain    LLQ 2-3 weeks  . Constipation  . Diarrhea  . Gastroesophageal Reflux    f/u    HPI:   Diane Torres is a 76 y.o. female presenting today with a history of esophagitis, dysphagia,undergoing EGD with LA Grade A esophagitis, s/p dilation earlier this year. She has a history of diverticulitis involving proximal sigmoid colon and rectosigmoid region in 2015. Presenting today with LLQ for 2-3 weeks.   States yesterday was a bad day. Bowel habits alternating. Yesterday morning had hard stool then progressed to runny stool. No rectal bleeding. No fever/chills. Taking tylenol with some improvement. Yesterday took 6 tablets of tylenol.   Taking Protonix once each morning. No dysphagia. No N/V.   Past Medical History:  Diagnosis Date  . GERD (gastroesophageal reflux disease)   . Hypothyroidism   . Impaired fasting glucose   . Mixed hyperlipidemia     Past Surgical History:  Procedure Laterality Date  . COLONOSCOPY    . COLONOSCOPY N/A 12/12/2015   Performed by Daneil Dolin, MD at [facility]  . ESOPHAGOGASTRODUODENOSCOPY (EGD) N/A 03/19/2016   Performed by Daneil Dolin, MD at [facility]  . Left lobe thyroidectomy    . MALONEY DILATION N/A 03/19/2016   Performed by Daneil Dolin, MD at [place]  . POLYPECTOMY  12/12/2015   Performed by Daneil Dolin, MD at [place]  . RECTOCELE REPAIR    . TOTAL ABDOMINAL HYSTERECTOMY W/ BILATERAL SALPINGOOPHORECTOMY    NOTE: Patient  [place] incomplete in this note due to computer issues. See history tab for complete information. History tab information was reviewed with the patient and found to be correct.    Current Outpatient Medications  Medication Sig Dispense Refill  . acetaminophen (TYLENOL) 325 MG tablet Take 650 mg by mouth every 6 (six) hours as needed.      Marland Kitchen aspirin EC 81 MG tablet Take 81 mg by mouth at bedtime.    . diphenhydramine-acetaminophen (TYLENOL PM) 25-500 MG TABS tablet Take 1 tablet by mouth at bedtime as needed (RESTLESSNESS).    Marland Kitchen loratadine (CLARITIN) 10 MG tablet Take 10 mg by mouth every morning.     . pantoprazole (PROTONIX) 40 MG tablet TAKE ONE TABLET BY MOUTH ONCE DAILY. 30 tablet 11  . simvastatin (ZOCOR) 40 MG tablet Take 40 mg by mouth at bedtime.      No current facility-administered medications for this visit.     Allergies as of 12/23/2016 - Review Complete 12/23/2016  Allergen Reaction Noted  . Bee venom Anaphylaxis 03/18/2016    Family History  Problem Relation Age of Onset  . CAD Brother        2 brothers with CAD in their 1s  . CAD Father   . Heart failure Mother   . Diabetes Mellitus II Mother     Social History   Socioeconomic History  . Marital status: Married    Spouse name: None  . Number of children: None  . Years of education: None  . Highest education level: None  Social Needs  . Financial resource strain: None  . Food insecurity - worry: None  . Food insecurity - inability: None  . Transportation needs - medical: None  . Transportation needs - non-medical: None  Occupational History  .  None  Tobacco Use  . Smoking status: Never Smoker  . Smokeless tobacco: Never Used  Substance and Sexual Activity  . Alcohol use: No  . Drug use: No  . Sexual activity: None  Other Topics Concern  . None  Social History Narrative  . None    Review of Systems: Gen: Denies fever, chills, anorexia. Denies fatigue, weakness, weight loss.  CV: Denies chest pain, palpitations, syncope, peripheral edema, and claudication. Resp: Denies dyspnea at rest, cough, wheezing, coughing up blood, and pleurisy. GI: see HPI  Derm: Denies rash, itching, dry skin Psych: Denies depression, anxiety, memory loss, confusion. No homicidal or suicidal ideation.  Heme: Denies bruising, bleeding, and enlarged  lymph nodes.  Physical Exam: BP (!) 157/83   Pulse 83   Temp (!) 96.4 F (35.8 C) (Oral)   Ht 5\' 1"  (1.549 m)   Wt 169 lb 6.4 oz (76.8 kg)   BMI 32.01 kg/m  General:   Alert and oriented. No distress noted. Pleasant and cooperative.  Head:  Normocephalic and atraumatic. Eyes:  Conjuctiva clear without scleral icterus. Mouth:  Oral mucosa pink and moist. Abdomen:  +BS, soft, TTP LLQ but without rebound. Non-distended. No HSM or masses noted. Msk:  Symmetrical without gross deformities. Normal posture. Extremities:  Without edema. Neurologic:  Alert and  oriented x4 Psych:  Alert and cooperative. Normal mood and affect.

## 2016-12-23 NOTE — Patient Instructions (Addendum)
Please have blood work done today.   We have scheduled a CT scan for tomorrow. I would like for you to go ahead and start the Cipro twice a day and Flagyl three times a day for a total of 7 days.    If you need a dose of the Diflucan after/while taking antibiotics, don't take the Zocor that day.   For now, follow a low fiber diet until you start feeling better.    Low-Fiber Diet Fiber is found in fruits, vegetables, and whole grains. A low-fiber diet restricts fibrous foods that are not digested in the small intestine. A diet containing about 10-15 grams of fiber per day is considered low fiber. Low-fiber diets may be used to:  Promote healing and rest the bowel during intestinal flare-ups.  Prevent blockage of a partially obstructed or narrowed gastrointestinal tract.  Reduce fecal weight and volume.  Slow the movement of feces.  You may be on a low-fiber diet as a transitional diet following surgery, after an injury (trauma), or because of a short (acute) or lifelong (chronic) illness. Your health care provider will determine the length of time you need to stay on this diet. What do I need to know about a low-fiber diet? Always check the fiber content on the packaging's Nutrition Facts label, especially on foods from the grains list. Ask your dietitian if you have questions about specific foods that are related to your condition, especially if the food is not listed below. In general, a low-fiber food will have less than 2 g of fiber. What foods can I eat? Grains All breads and crackers made with white flour. Sweet rolls, doughnuts, waffles, pancakes, [place] toast, bagels. Pretzels, Melba toast, zwieback. Well-cooked cereals, such as cornmeal, farina, or cream cereals. Dry cereals that do not contain whole grains, fruit, or nuts, such as refined corn, wheat, rice, and oat cereals. Potatoes prepared any way without skins, plain pastas and noodles, refined white rice. Use white flour for  baking and making sauces. Use allowed list of grains for casseroles, dumplings, and puddings. Vegetables Strained tomato and vegetable juices. Fresh lettuce, cucumber, spinach. Well-cooked (no skin or pulp) or canned vegetables, such as asparagus, bean sprouts, beets, carrots, green beans, mushrooms, potatoes, pumpkin, spinach, yellow squash, tomato sauce/puree, turnips, yams, and zucchini. Keep servings limited to  cup. Fruits All fruit juices except prune juice. Cooked or canned fruits without skin and seeds, such as applesauce, apricots, cherries, fruit cocktail, grapefruit, grapes, mandarin oranges, melons, peaches, pears, pineapple, and plums. Fresh fruits without skin, such as apricots, avocados, bananas, melons, pineapple, nectarines, and peaches. Keep servings limited to  cup or 1 piece. Meat and Other Protein Sources Ground or well-cooked tender beef, ham, veal, lamb, pork, or poultry. Eggs, plain cheese. Fish, oysters, shrimp, lobster, and other seafood. Liver, organ meats. Smooth nut butters. Dairy All milk products and alternative dairy substitutes, such as soy, rice, almond, and coconut, not containing added whole nuts, seeds, or added fruit. Beverages Decaf coffee, fruit, and vegetable juices or smoothies (small amounts, with no pulp or skins, and with fruits from allowed list), sports drinks, herbal tea. Condiments Ketchup, mustard, vinegar, cream sauce, cheese sauce, cocoa powder. Spices in moderation, such as allspice, basil, bay leaves, celery powder or leaves, cinnamon, cumin powder, curry powder, ginger, mace, marjoram, onion or garlic powder, oregano, paprika, parsley flakes, ground pepper, rosemary, sage, savory, tarragon, thyme, and turmeric. Sweets and Desserts Plain cakes and cookies, pie made with allowed fruit, pudding, custard, cream  pie. Gelatin, fruit, ice, sherbet, frozen ice pops. Ice cream, ice milk without nuts. Plain hard candy, honey, jelly, molasses, syrup, sugar,  chocolate syrup, gumdrops, marshmallows. Limit overall sugar intake. Fats and Oil Margarine, butter, cream, mayonnaise, salad oils, plain salad dressings made from allowed foods. Choose healthy fats such as olive oil, canola oil, and omega-3 fatty acids (such as found in salmon or tuna) when possible. Other Bouillon, broth, or cream soups made from allowed foods. Any strained soup. Casseroles or mixed dishes made with allowed foods. The items listed above may not be a complete list of recommended foods or beverages. Contact your dietitian for more options. What foods are not recommended? Grains All whole wheat and whole grain breads and crackers. Multigrains, rye, bran seeds, nuts, or coconut. Cereals containing whole grains, multigrains, bran, coconut, nuts, raisins. Cooked or dry oatmeal, steel-cut oats. Coarse wheat cereals, granola. Cereals advertised as high fiber. Potato skins. Whole grain pasta, wild or brown rice. Popcorn. Coconut flour. Bran, buckwheat, corn bread, multigrains, rye, wheat germ. Vegetables Fresh, cooked or canned vegetables, such as artichokes, asparagus, beet greens, broccoli, Brussels sprouts, cabbage, celery, cauliflower, corn, eggplant, kale, legumes or beans, okra, peas, and tomatoes. Avoid large servings of any vegetables, especially raw vegetables. Fruits Fresh fruits, such as apples with or without skin, berries, cherries, figs, grapes, grapefruit, guavas, kiwis, mangoes, oranges, papayas, pears, persimmons, pineapple, and pomegranate. Prune juice and juices with pulp, stewed or dried prunes. Dried fruits, dates, raisins. Fruit seeds or skins. Avoid large servings of all fresh fruits. Meats and Other Protein Sources Tough, fibrous meats with gristle. Chunky nut butter. Cheese made with seeds, nuts, or other foods not recommended. Nuts, seeds, legumes (beans, including baked beans), dried peas, beans, lentils. Dairy Yogurt or cheese that contains nuts, seeds, or added  fruit. Beverages Fruit juices with high pulp, prune juice. Caffeinated coffee and teas. Condiments Coconut, maple syrup, pickles, olives. Sweets and Desserts Desserts, cookies, or candies that contain nuts or coconut, chunky peanut butter, dried fruits. Jams, preserves with seeds, marmalade. Large amounts of sugar and sweets. Any other dessert made with fruits from the not recommended list. Other Soups made from vegetables that are not recommended or that contain other foods not recommended. The items listed above may not be a complete list of foods and beverages to avoid. Contact your dietitian for more information. This information is not intended to replace advice given to you by your health care provider. Make sure you discuss any questions you have with your health care provider. Document Released: 07/12/2001 Document Revised: 06/28/2015 Document Reviewed: 12/13/2012 Elsevier Interactive Patient Education  2017 [name].

## 2016-12-23 NOTE — Progress Notes (Signed)
cc'ed to pcp °

## 2016-12-23 NOTE — Progress Notes (Signed)
CT results reviewed with patient. Acute distal descending colon diverticulitis. Abx have already been provided. Please arrange follow-up in 6 weeks.

## 2016-12-24 ENCOUNTER — Encounter: Payer: Self-pay | Admitting: Gastroenterology

## 2016-12-24 NOTE — Progress Notes (Signed)
PATIENT SCHEDULED  °

## 2016-12-29 ENCOUNTER — Telehealth: Payer: Self-pay | Admitting: Internal Medicine

## 2016-12-29 MED ORDER — FLUCONAZOLE 150 MG PO TABS: 150.0000 mg | ORAL_TABLET | Freq: Every day | ORAL | 0 refills | Status: DC

## 2016-12-29 NOTE — Telephone Encounter (Signed)
Pt notified and will discuss with pcp or gyn dr if needed.

## 2016-12-29 NOTE — Telephone Encounter (Signed)
[phone] PLEASE CALL PATIENT, SHE HAS QUESTIONS ABOUT THE MEDICATION SHE WAS TAKING FOR HER YEAST INFECTION AND SHE IS STILL HAVING ABD PAIN, AND NOW LEG WEAKNESS

## 2016-12-29 NOTE — Telephone Encounter (Signed)
Spoke with pt concerning her medications. Pt is currently taking Flagyl & Cipro as directed. Pt has experienced some diarrhea while on the medication and will complete meds on Tomorrow. Pt took Diflucan Friday to help with itching but feels she needs a refill. C/o itching & burning.

## 2016-12-29 NOTE — Telephone Encounter (Signed)
If needs to take Diflucan one more dose, I sent in to her pharmacy. DO NOT TAKE ZOCOR while taking Diflucan. Diarrhea likely secondary to antibiotics. Would also use over the counter topical cream for ?yeast infection. If does not help, needs to see PCP/GYN about yeast infection.

## 2017-02-18 ENCOUNTER — Ambulatory Visit: Payer: Medicare Other | Admitting: Gastroenterology

## 2017-04-25 ENCOUNTER — Other Ambulatory Visit: Payer: Self-pay | Admitting: Gastroenterology

## 2017-06-08 DIAGNOSIS — M25551 Pain in right hip: Secondary | ICD-10-CM | POA: Diagnosis not present

## 2017-06-08 DIAGNOSIS — M47816 Spondylosis without myelopathy or radiculopathy, lumbar region: Secondary | ICD-10-CM | POA: Diagnosis not present

## 2017-06-08 DIAGNOSIS — M25561 Pain in right knee: Secondary | ICD-10-CM | POA: Diagnosis not present

## 2017-06-30 ENCOUNTER — Emergency Department (HOSPITAL_COMMUNITY): Payer: Medicare Other

## 2017-06-30 ENCOUNTER — Other Ambulatory Visit: Payer: Self-pay

## 2017-06-30 ENCOUNTER — Inpatient Hospital Stay (HOSPITAL_COMMUNITY)
Admission: EM | Admit: 2017-06-30 | Discharge: 2017-07-01 | DRG: 392 | Disposition: A | Discharge status: Home / Self Care | Payer: Medicare Other | Attending: Family Medicine | Admitting: Family Medicine

## 2017-06-30 ENCOUNTER — Encounter (HOSPITAL_COMMUNITY): Payer: Self-pay | Admitting: *Deleted

## 2017-06-30 DIAGNOSIS — K760 Fatty (change of) liver, not elsewhere classified: Secondary | ICD-10-CM | POA: Diagnosis not present

## 2017-06-30 DIAGNOSIS — K625 Hemorrhage of anus and rectum: Secondary | ICD-10-CM | POA: Diagnosis not present

## 2017-06-30 DIAGNOSIS — R1032 Left lower quadrant pain: Secondary | ICD-10-CM | POA: Diagnosis not present

## 2017-06-30 DIAGNOSIS — N183 Chronic kidney disease, stage 3 unspecified: Secondary | ICD-10-CM | POA: Diagnosis present

## 2017-06-30 DIAGNOSIS — E890 Postprocedural hypothyroidism: Secondary | ICD-10-CM | POA: Diagnosis present

## 2017-06-30 DIAGNOSIS — K219 Gastro-esophageal reflux disease without esophagitis: Secondary | ICD-10-CM | POA: Diagnosis present

## 2017-06-30 DIAGNOSIS — K5792 Diverticulitis of intestine, part unspecified, without perforation or abscess without bleeding: Secondary | ICD-10-CM | POA: Diagnosis not present

## 2017-06-30 DIAGNOSIS — E039 Hypothyroidism, unspecified: Secondary | ICD-10-CM

## 2017-06-30 DIAGNOSIS — K5790 Diverticulosis of intestine, part unspecified, without perforation or abscess without bleeding: Secondary | ICD-10-CM | POA: Diagnosis not present

## 2017-06-30 DIAGNOSIS — Z91030 Bee allergy status: Secondary | ICD-10-CM | POA: Diagnosis not present

## 2017-06-30 DIAGNOSIS — E782 Mixed hyperlipidemia: Secondary | ICD-10-CM | POA: Diagnosis not present

## 2017-06-30 DIAGNOSIS — Z7982 Long term (current) use of aspirin: Secondary | ICD-10-CM

## 2017-06-30 DIAGNOSIS — Z86010 Personal history of colonic polyps: Secondary | ICD-10-CM | POA: Diagnosis not present

## 2017-06-30 DIAGNOSIS — K5730 Diverticulosis of large intestine without perforation or abscess without bleeding: Secondary | ICD-10-CM | POA: Diagnosis not present

## 2017-06-30 DIAGNOSIS — D72829 Elevated white blood cell count, unspecified: Secondary | ICD-10-CM | POA: Diagnosis not present

## 2017-06-30 DIAGNOSIS — Z79899 Other long term (current) drug therapy: Secondary | ICD-10-CM | POA: Diagnosis not present

## 2017-06-30 HISTORY — DX: Diverticulitis of intestine, part unspecified, without perforation or abscess without bleeding: K57.92

## 2017-06-30 LAB — CBC WITH DIFFERENTIAL/PLATELET
BASOS ABS: 0 10*3/uL (ref 0.0–0.1)
Basophils Relative: 0 %
Eosinophils Absolute: 0.3 10*3/uL (ref 0.0–0.7)
Eosinophils Relative: 2 %
HEMATOCRIT: 40.4 % (ref 36.0–46.0)
HEMOGLOBIN: 12.8 g/dL (ref 12.0–15.0)
LYMPHS PCT: 17 %
Lymphs Abs: 2.1 10*3/uL (ref 0.7–4.0)
MCH: 29.2 pg (ref 26.0–34.0)
MCHC: 31.7 g/dL (ref 30.0–36.0)
MCV: 92.2 fL (ref 78.0–100.0)
MONO ABS: 0.9 10*3/uL (ref 0.1–1.0)
Monocytes Relative: 7 %
NEUTROS ABS: 9 10*3/uL — AB (ref 1.7–7.7)
Neutrophils Relative %: 74 %
Platelets: 279 10*3/uL (ref 150–400)
RBC: 4.38 MIL/uL (ref 3.87–5.11)
RDW: 15.7 % — AB (ref 11.5–15.5)
WBC: 12.3 10*3/uL — ABNORMAL HIGH (ref 4.0–10.5)

## 2017-06-30 LAB — COMPREHENSIVE METABOLIC PANEL
ALBUMIN: 3.2 g/dL — AB (ref 3.5–5.0)
ALT: 33 U/L (ref 14–54)
ANION GAP: 9 (ref 5–15)
AST: 21 U/L (ref 15–41)
Alkaline Phosphatase: 73 U/L (ref 38–126)
BILIRUBIN TOTAL: 1 mg/dL (ref 0.3–1.2)
BUN: 21 mg/dL — ABNORMAL HIGH (ref 6–20)
CO2: 25 mmol/L (ref 22–32)
Calcium: 9 mg/dL (ref 8.9–10.3)
Chloride: 102 mmol/L (ref 101–111)
Creatinine, Ser: 1.17 mg/dL — ABNORMAL HIGH (ref 0.44–1.00)
GFR calc Af Amer: 51 mL/min — ABNORMAL LOW (ref >60)
GFR calc non Af Amer: 44 mL/min — ABNORMAL LOW (ref >60)
GLUCOSE: 136 mg/dL — AB (ref 65–99)
POTASSIUM: 4.1 mmol/L (ref 3.5–5.1)
SODIUM: 136 mmol/L (ref 135–145)
TOTAL PROTEIN: 7.3 g/dL (ref 6.5–8.1)

## 2017-06-30 LAB — URINALYSIS, ROUTINE W REFLEX MICROSCOPIC
BILIRUBIN URINE: NEGATIVE
Bacteria, UA: NONE SEEN
Glucose, UA: NEGATIVE mg/dL
Ketones, ur: NEGATIVE mg/dL
Nitrite: NEGATIVE
Protein, ur: NEGATIVE mg/dL
SPECIFIC GRAVITY, URINE: 1.018 (ref 1.005–1.030)
pH: 6 (ref 5.0–8.0)

## 2017-06-30 LAB — SAMPLE TO BLOOD BANK

## 2017-06-30 LAB — PROTIME-INR
INR: 1.06
Prothrombin Time: 13.7 seconds (ref 11.4–15.2)

## 2017-06-30 LAB — LIPASE, BLOOD: Lipase: 31 U/L (ref 11–51)

## 2017-06-30 LAB — POC OCCULT BLOOD, ED: FECAL OCCULT BLD: POSITIVE — AB

## 2017-06-30 LAB — APTT: aPTT: 32 seconds (ref 24–36)

## 2017-06-30 MED ORDER — TRAZODONE HCL 50 MG PO TABS: 25.0000 mg | ORAL_TABLET | Freq: Every evening | ORAL | Status: DC | PRN

## 2017-06-30 MED ORDER — ACETAMINOPHEN 650 MG RE SUPP: 650.0000 mg | Freq: Four times a day (QID) | RECTAL | Status: DC | PRN

## 2017-06-30 MED ORDER — CIPROFLOXACIN IN D5W 400 MG/200ML IV SOLN
400.0000 mg | Freq: Two times a day (BID) | INTRAVENOUS | Status: AC
  Administered 2017-06-30 – 2017-07-01 (×2): 400 mg via INTRAVENOUS
  Filled 2017-06-30 (×2): qty 200

## 2017-06-30 MED ORDER — CIPROFLOXACIN IN D5W 400 MG/200ML IV SOLN
400.0000 mg | Freq: Once | INTRAVENOUS | Status: AC
  Administered 2017-06-30: 400 mg via INTRAVENOUS
  Filled 2017-06-30: qty 200

## 2017-06-30 MED ORDER — SODIUM CHLORIDE 0.9 % IV SOLN
INTRAVENOUS | Status: DC
  Administered 2017-06-30: 10:00:00 via INTRAVENOUS

## 2017-06-30 MED ORDER — SODIUM CHLORIDE 0.9 % IV BOLUS
500.0000 mL | Freq: Once | INTRAVENOUS | Status: AC
  Administered 2017-06-30: 500 mL via INTRAVENOUS

## 2017-06-30 MED ORDER — METRONIDAZOLE IN NACL 5-0.79 MG/ML-% IV SOLN
500.0000 mg | Freq: Three times a day (TID) | INTRAVENOUS | Status: DC
  Administered 2017-06-30 – 2017-07-01 (×2): 500 mg via INTRAVENOUS
  Filled 2017-06-30 (×3): qty 100

## 2017-06-30 MED ORDER — IOPAMIDOL (ISOVUE-300) INJECTION 61%
100.0000 mL | Freq: Once | INTRAVENOUS | Status: AC | PRN
  Administered 2017-06-30: 80 mL via INTRAVENOUS

## 2017-06-30 MED ORDER — LORATADINE 10 MG PO TABS
10.0000 mg | ORAL_TABLET | Freq: Every morning | ORAL | Status: DC
  Administered 2017-06-30 – 2017-07-01 (×2): 10 mg via ORAL
  Filled 2017-06-30 (×3): qty 1

## 2017-06-30 MED ORDER — ACETAMINOPHEN 325 MG PO TABS: 650.0000 mg | ORAL_TABLET | Freq: Four times a day (QID) | ORAL | Status: DC | PRN

## 2017-06-30 MED ORDER — METRONIDAZOLE IN NACL 5-0.79 MG/ML-% IV SOLN
500.0000 mg | Freq: Once | INTRAVENOUS | Status: AC
  Administered 2017-06-30: 500 mg via INTRAVENOUS
  Filled 2017-06-30: qty 100

## 2017-06-30 MED ORDER — DOCUSATE SODIUM 100 MG PO CAPS
100.0000 mg | ORAL_CAPSULE | Freq: Two times a day (BID) | ORAL | Status: DC
  Administered 2017-06-30 – 2017-07-01 (×3): 100 mg via ORAL
  Filled 2017-06-30 (×3): qty 1

## 2017-06-30 MED ORDER — MORPHINE SULFATE (PF) 2 MG/ML IV SOLN: 1.0000 mg | INTRAVENOUS | Status: DC | PRN

## 2017-06-30 MED ORDER — ONDANSETRON HCL 4 MG/2ML IJ SOLN
4.0000 mg | Freq: Four times a day (QID) | INTRAMUSCULAR | Status: DC | PRN
Start: 2017-06-30 — End: 2017-07-01

## 2017-06-30 MED ORDER — ONDANSETRON HCL 4 MG PO TABS: 4.0000 mg | ORAL_TABLET | Freq: Four times a day (QID) | ORAL | Status: DC | PRN

## 2017-06-30 MED ORDER — SODIUM CHLORIDE 0.9 % IV SOLN
INTRAVENOUS | Status: DC
  Administered 2017-06-30 – 2017-07-01 (×2): via INTRAVENOUS

## 2017-06-30 MED ORDER — SIMVASTATIN 20 MG PO TABS
40.0000 mg | ORAL_TABLET | Freq: Every day | ORAL | Status: DC
  Administered 2017-06-30: 40 mg via ORAL
  Filled 2017-06-30: qty 2

## 2017-06-30 MED ORDER — PANTOPRAZOLE SODIUM 40 MG PO TBEC
40.0000 mg | DELAYED_RELEASE_TABLET | Freq: Every day | ORAL | Status: DC
  Administered 2017-06-30 – 2017-07-01 (×2): 40 mg via ORAL
  Filled 2017-06-30 (×2): qty 1

## 2017-06-30 MED ORDER — HYDROCODONE-ACETAMINOPHEN 5-325 MG PO TABS: 1.0000 | ORAL_TABLET | ORAL | Status: DC | PRN

## 2017-06-30 NOTE — Consult Note (Signed)
Referring Provider: Dr. Wynetta Emery  Primary Care Physician:  Asencion Noble, MD Primary Gastroenterologist:  Dr. Gala Romney   Date of Admission: 06/30/17 Date of Consultation: 06/30/17  Reason for Consultation:  Rectal bleeding, diverticulitis  HPI:  Diane Torres is a 77 y.o. year old female history of diverticulitis involving proximal sigmoid colon and rectosigmoid region in 2015 and distal descending colon diverticulitis Nov 2018 documented on CT scan. Treated as outpatient Nov 2018. Colonoscopy fairly recent on file with 3 mm tubular adenoma of rectum, diverticulosis in entire colon, internal hemorrhoids.    Saturday mild lower abdominal pain. Lower abdominal pain again this morning. When sitting still, not painful. When standing it is painful. No fever or chills. Saw small amount of blood yesterday. Around 0130am thought she needed to have a BM but had just bright red blood on tissue. Had another episode of bright red blood prior to presentation to ED, large amount in toilet. Last rectal bleeding prior to ED presentation. Yesterday morning felt nauseated. No vomiting.   Has had issues with back and knee. Got cortisone shot recently. Was on a prednisone taper about a month ago, finishing it 3 weeks ago. Takes Tylenol prn. 81 mg aspirin daily.    Past Medical History:  Diagnosis Date  . Diverticulitis 2018  . GERD (gastroesophageal reflux disease)   . Hypothyroidism   . Impaired fasting glucose   . Mixed hyperlipidemia     Past Surgical History:  Procedure Laterality Date  . COLONOSCOPY    . COLONOSCOPY N/A 12/12/2015   Dr. Gala Romney: one 3 mm tubular adenoma in rectum s/p removal. Diverticulosis in entire colon. Internal hemorrhoids.   . ESOPHAGOGASTRODUODENOSCOPY N/A 03/19/2016   Dr. Gala Romney: LA Grade A esophagitis, empiric dilation, normal stomach, normal duodenum  . Left lobe thyroidectomy    . MALONEY DILATION N/A 03/19/2016   Procedure: Venia Minks DILATION;  Surgeon: Daneil Dolin, MD;   Location: [facility];  Service: Endoscopy;  Laterality: N/A;  . POLYPECTOMY  12/12/2015   Procedure: POLYPECTOMY;  Surgeon: Daneil Dolin, MD;  Location: [facility];  Service: Endoscopy;;  colon  . RECTOCELE REPAIR    . TOTAL ABDOMINAL HYSTERECTOMY W/ BILATERAL SALPINGOOPHORECTOMY      Prior to Admission medications   Medication Sig Start Date End Date Taking? Authorizing Provider  acetaminophen (TYLENOL) 325 MG tablet Take 650 mg by mouth every 6 (six) hours as needed.   Yes [provider]  aspirin EC 81 MG tablet Take 81 mg by mouth at bedtime.   Yes [provider]  loratadine (CLARITIN) 10 MG tablet Take 10 mg by mouth every morning.    Yes [provider]  pantoprazole (PROTONIX) 40 MG tablet TAKE ONE TABLET BY MOUTH ONCE DAILY. 08/07/16  Yes Mahala Menghini, PA-C  simvastatin (ZOCOR) 40 MG tablet Take 40 mg by mouth at bedtime.    Yes [provider]    Current Facility-Administered Medications  Medication Dose Route Frequency Provider Last Rate Last Dose  . 0.9 %  sodium chloride infusion   Intravenous Continuous Johnson, Clanford L, MD      . acetaminophen (TYLENOL) tablet 650 mg  650 mg Oral Q6H PRN Johnson, Clanford L, MD       Or  . acetaminophen (TYLENOL) suppository 650 mg  650 mg Rectal Q6H PRN Johnson, Clanford L, MD      . ciprofloxacin (CIPRO) IVPB 400 mg  400 mg Intravenous Q12H Johnson, Clanford L, MD      .  docusate sodium (COLACE) capsule 100 mg  100 mg Oral BID Johnson, Clanford L, MD      . HYDROcodone-acetaminophen (NORCO/VICODIN) 5-325 MG per tablet 1-2 tablet  1-2 tablet Oral Q4H PRN Johnson, Clanford L, MD      . loratadine (CLARITIN) tablet 10 mg  10 mg Oral q morning - 10a Johnson, Clanford L, MD      . metroNIDAZOLE (FLAGYL) IVPB 500 mg  500 mg Intravenous Q8H Johnson, Clanford L, MD      . morphine 2 MG/ML injection 1 mg  1 mg Intravenous Q3H PRN Johnson, Clanford L, MD      . ondansetron (ZOFRAN) tablet 4 mg  4 mg  Oral Q6H PRN Johnson, Clanford L, MD       Or  . ondansetron (ZOFRAN) injection 4 mg  4 mg Intravenous Q6H PRN Johnson, Clanford L, MD      . pantoprazole (PROTONIX) EC tablet 40 mg  40 mg Oral Daily Johnson, Clanford L, MD      . simvastatin (ZOCOR) tablet 40 mg  40 mg Oral QHS Johnson, Clanford L, MD      . traZODone (DESYREL) tablet 25 mg  25 mg Oral QHS PRN Wynetta Emery, Clanford L, MD        Allergies as of 06/30/2017 - Review Complete 06/30/2017  Allergen Reaction Noted  . Bee venom Anaphylaxis 03/18/2016    Family History  Problem Relation Age of Onset  . CAD Brother        2 brothers with CAD in their 37s  . CAD Father   . Heart failure Mother   . Diabetes Mellitus II Mother     Social History   Socioeconomic History  . Marital status: Married    Spouse name: Not on file  . Number of children: Not on file  . Years of education: Not on file  . Highest education level: Not on file  Occupational History  . Not on file  Social Needs  . Financial resource strain: Not on file  . Food insecurity:    Worry: Not on file    Inability: Not on file  . Transportation needs:    Medical: Not on file    Non-medical: Not on file  Tobacco Use  . Smoking status: Never Smoker  . Smokeless tobacco: Never Used  Substance and Sexual Activity  . Alcohol use: No  . Drug use: No  . Sexual activity: Not on file  Lifestyle  . Physical activity:    Days per week: Not on file    Minutes per session: Not on file  . Stress: Not on file  Relationships  . Social connections:    Talks on phone: Not on file    Gets together: Not on file    Attends religious service: Not on file    Active member of club or organization: Not on file    Attends meetings of clubs or organizations: Not on file    Relationship status: Not on file  . Intimate partner violence:    Fear of current or ex partner: Not on file    Emotionally abused: Not on file    Physically abused: Not on file    Forced sexual  activity: Not on file  Other Topics Concern  . Not on file  Social History Narrative  . Not on file    Review of Systems: Gen: Denies fever, chills, loss of appetite, change in weight or weight loss CV: Denies chest pain,  heart palpitations, syncope, edema  Resp: Denies shortness of breath with rest, cough, wheezing GI: see HPI  GU : Denies urinary burning, urinary frequency, urinary incontinence.  MS: Denies joint pain,swelling, cramping Derm: Denies rash, itching, dry skin Psych: Denies depression, anxiety,confusion, or memory loss Heme: see HPI   Physical Exam: Vital signs in last 24 hours: Temp:  [98.6 F (37 C)] 98.6 F (37 C) (05/28 0616) Pulse Rate:  [73-108] 79 (05/28 1047) Resp:  [14-18] 14 (05/28 1047) BP: (100-168)/(54-83) 143/56 (05/28 1047) SpO2:  [94 %-96 %] 96 % (05/28 1047) Weight:  [170 lb (77.1 kg)] 170 lb (77.1 kg) (05/28 0614) Last BM Date: 06/29/17 General:   Alert,  Well-developed, well-nourished, pleasant and cooperative in NAD Head:  Normocephalic and atraumatic. Eyes:  Sclera clear, no icterus.   Conjunctiva pink. Ears:  Normal auditory acuity. Nose:  No deformity, discharge,  or lesions. Mouth:  No deformity or lesions, dentition normal. Lungs:  Clear throughout to auscultation.   No wheezes, crackles, or rhonchi. No acute distress. Heart:  S1 S2 present without murmurs  Abdomen:  Soft, nontender and nondistended. No masses, hepatosplenomegaly or hernias noted. Normal bowel sounds, without guarding, and without rebound.   Rectal:  Deferred  Msk:  Symmetrical without gross deformities. Normal posture. Extremities:  Without  edema. Neurologic:  Alert and  oriented x4 Psych:  Alert and cooperative. Normal mood and affect.  Intake/Output from previous day: No intake/output data recorded. Intake/Output this shift: Total I/O In: 500 [IV Piggyback:500] Out: -   Lab Results: Recent Labs    06/30/17 0645  WBC 12.3*  HGB 12.8  HCT 40.4  PLT  279   BMET Recent Labs    06/30/17 0645  NA 136  K 4.1  CL 102  CO2 25  GLUCOSE 136*  BUN 21*  CREATININE 1.17*  CALCIUM 9.0   LFT Recent Labs    06/30/17 0645  PROT 7.3  ALBUMIN 3.2*  AST 21  ALT 33  ALKPHOS 73  BILITOT 1.0   PT/INR Recent Labs    06/30/17 0645  LABPROT 13.7  INR 1.06    Studies/Results: Ct Abdomen Pelvis W Contrast  Result Date: 06/30/2017 CLINICAL DATA:  Bright red blood in stool.  Abdominal pain EXAM: CT ABDOMEN AND PELVIS WITH CONTRAST TECHNIQUE: Multidetector CT imaging of the abdomen and pelvis was performed using the standard protocol following bolus administration of intravenous contrast. CONTRAST:  28mL ISOVUE-300 IOPAMIDOL (ISOVUE-300) INJECTION 61% COMPARISON:  12/23/2016 FINDINGS: Lower chest: No acute abnormality. Hepatobiliary: Increased echotexture compatible with fatty infiltration. No focal abnormality or biliary ductal dilatation. Pancreas: No focal abnormality or ductal dilatation. Spleen: No focal abnormality.  Normal size. Adrenals/Urinary Tract: No adrenal abnormality. No focal renal abnormality. No stones or hydronephrosis. Urinary bladder is unremarkable. Stomach/Bowel: Diffuse colonic diverticulosis, most pronounced in the sigmoid colon. Inflammatory stranding noted around the mid sigmoid colon compatible with active diverticulitis. Stomach and small bowel decompressed, unremarkable. Vascular/Lymphatic: Aortic atherosclerosis. No enlarged abdominal or pelvic lymph nodes. Reproductive: Prior hysterectomy.  No adnexal masses. Other: No free fluid or free air. Musculoskeletal: Degenerative changes in the lumbar spine. No acute bony abnormality. IMPRESSION: Diffuse colonic diverticulosis. Inflammatory stranding around the sigmoid colon compatible with active diverticulitis. Fatty infiltration of the liver. Aortoiliac atherosclerosis. Electronically Signed   By: Rolm Baptise M.D.   On: 06/30/2017 09:19    Impression: 77 year old female  admitted with abdominal pain and rectal bleeding, with CT scan noting uncomplicated sigmoid diverticulitis. Previously treated as outpatient  in Nov 2018 with distal descending colon diverticulitis. Initial episode in 2015 involving proximal sigmoid and rectosigmoid. Clinically, she has improved and has had no further rectal bleeding since admission. Typically, rectal bleeding is not routinely seen with diverticulitis:iInterestingly, she notes that she also had rectal bleeding in 2015 with initial episode. Colonoscopy fairly recently on file from Nov 2017. Clear liquids started around lunch time.  If continues to improve, advance to low residue diet. Hopeful discharge in near future if remains without further bleeding or decline in condition. Would recommend outpatient surgical consultation  Plan: Clear liquids, advancing to low residue diet as tolerated Continue antibiotics Follow clinically, monitor for any further significant bleeding Recommend discontinuing aspirin as outpatient Outpatient surgical consultation   Annitta Needs, PhD, ANP-BC [facility]     LOS: 0 days    06/30/2017, 12:58 PM

## 2017-06-30 NOTE — ED Triage Notes (Signed)
Pt stats she noticed bright red blood in her stool yesterday and this morning when she woke up it was more blood in her stool; pt c/o some lower abdominal pain

## 2017-06-30 NOTE — H&P (Signed)
History and Physical  [name]:[id number] DOB: [birth year] DOA: 06/30/2017   Referring physician: Thurnell Garbe PCP: Asencion Noble, MD   Chief Complaint: rectal bleeding  HPI: Diane Torres is a 77 y.o. female with known history of colonic diverticulosis presented to ED with complaints of gradual onset of lower abdominal pain that started yesterday.  This has been associated with multiple episodes of rectal bleeding.  She says that she has the urge to defecate but it is associated with rectal bleeding and no stool.  She says that the first episode of defecation was associated with a small amount of blood but the second episode was associated with a large amount of blood in the toilet.  She says that she has been having cramping abdominal pain in the LLQ, no emesis.  She had some nausea yesterday but that has resolved for the most part.  She denies black stools. She does have history of internal hemorrhoids.  She had a colonoscopy in 2017 with findings of diverticulosis, tubular adenoma and internal hemorrhoids done by Dr. Gala Romney.    ED course:  Pt was noted to have maroon stool and hemoccult positive.  She had a normal hemoglobin.  Her CT scan was positive for acute diverticulitis. She has an elevated WBC and no fever but had some soft blood pressures that responded to fluid challenges.  She is being admitted for further management.    Review of Systems: All systems reviewed and apart from history of presenting illness, are negative.  Past Medical History:  Diagnosis Date  . Diverticulitis 2018  . GERD (gastroesophageal reflux disease)   . Hypothyroidism   . Impaired fasting glucose   . Mixed hyperlipidemia    Past Surgical History:  Procedure Laterality Date  . COLONOSCOPY    . COLONOSCOPY N/A 12/12/2015   Dr. Gala Romney: one 3 mm tubular adenoma in rectum s/p removal. Diverticulosis in entire colon. Internal hemorrhoids.   . ESOPHAGOGASTRODUODENOSCOPY N/A 03/19/2016   Dr. Gala Romney: LA Grade A  esophagitis, empiric dilation, normal stomach, normal duodenum  . Left lobe thyroidectomy    . MALONEY DILATION N/A 03/19/2016   Procedure: Venia Minks DILATION;  Surgeon: Daneil Dolin, MD;  Location: [facility];  Service: Endoscopy;  Laterality: N/A;  . POLYPECTOMY  12/12/2015   Procedure: POLYPECTOMY;  Surgeon: Daneil Dolin, MD;  Location: [facility];  Service: Endoscopy;;  colon  . RECTOCELE REPAIR    . TOTAL ABDOMINAL HYSTERECTOMY W/ BILATERAL SALPINGOOPHORECTOMY     Social History:  reports that she has never smoked. She has never used smokeless tobacco. She reports that she does not drink alcohol or use drugs.  Allergies  Allergen Reactions  . Bee Venom Anaphylaxis    Family History  Problem Relation Age of Onset  . CAD Brother        2 brothers with CAD in their 71s  . CAD Father   . Heart failure Mother   . Diabetes Mellitus II Mother     Prior to Admission medications   Medication Sig Start Date End Date Taking? Authorizing Provider  acetaminophen (TYLENOL) 325 MG tablet Take 650 mg by mouth every 6 (six) hours as needed.   Yes [provider]  aspirin EC 81 MG tablet Take 81 mg by mouth at bedtime.   Yes [provider]  loratadine (CLARITIN) 10 MG tablet Take 10 mg by mouth every morning.    Yes [provider]  pantoprazole (PROTONIX) 40 MG tablet  TAKE ONE TABLET BY MOUTH ONCE DAILY. 08/07/16  Yes Mahala Menghini, PA-C  simvastatin (ZOCOR) 40 MG tablet Take 40 mg by mouth at bedtime.    Yes [provider]   Physical Exam: Vitals:   06/30/17 0900 06/30/17 0930 06/30/17 0945 06/30/17 1000  BP: (!) 168/54 100/83  128/66  Pulse:   81 73  Resp:      Temp:      TempSrc:      SpO2:   96% 95%  Weight:      Height:         General exam: Moderately built and nourished patient, lying comfortably supine on the gurney in no obvious distress.  Head, eyes and ENT: Nontraumatic and normocephalic. Pupils equally reacting to light and  accommodation. Oral mucosa moist.  Neck: Supple. No JVD, carotid bruit or thyromegaly.  Lymphatics: No lymphadenopathy.  Respiratory system: Clear to auscultation. No increased work of breathing.  Cardiovascular system: S1 and S2 heard, RRR. No JVD, murmurs, gallops, clicks or pedal edema.  Gastrointestinal system: Abdomen is nondistended, soft and mild generalized lower abdominal tenderness. No guarding.  No rebound tenderness.  No HSM. Normal bowel sounds heard. No organomegaly or masses appreciated.  Central nervous system: Alert and oriented. No focal neurological deficits.  Extremities: Symmetric 5 x 5 power. Peripheral pulses symmetrically felt.   Skin: No rashes or acute findings.  Musculoskeletal system: Negative exam.  Psychiatry: Pleasant and cooperative.  Labs on Admission:  Basic Metabolic Panel: Recent Labs  Lab 06/30/17 0645  NA 136  K 4.1  CL 102  CO2 25  GLUCOSE 136*  BUN 21*  CREATININE 1.17*  CALCIUM 9.0   Liver Function Tests: Recent Labs  Lab 06/30/17 0645  AST 21  ALT 33  ALKPHOS 73  BILITOT 1.0  PROT 7.3  ALBUMIN 3.2*   Recent Labs  Lab 06/30/17 0645  LIPASE 31   No results for input(s): AMMONIA in the last 168 hours. CBC: Recent Labs  Lab 06/30/17 0645  WBC 12.3*  NEUTROABS 9.0*  HGB 12.8  HCT 40.4  MCV 92.2  PLT 279   Cardiac Enzymes: No results for input(s): CKTOTAL, CKMB, CKMBINDEX, TROPONINI in the last 168 hours.  BNP (last 3 results) No results for input(s): PROBNP in the last 8760 hours. CBG: No results for input(s): GLUCAP in the last 168 hours.  Radiological Exams on Admission: Ct Abdomen Pelvis W Contrast  Result Date: 06/30/2017 CLINICAL DATA:  Bright red blood in stool.  Abdominal pain EXAM: CT ABDOMEN AND PELVIS WITH CONTRAST TECHNIQUE: Multidetector CT imaging of the abdomen and pelvis was performed using the standard protocol following bolus administration of intravenous contrast. CONTRAST:  73mL  ISOVUE-300 IOPAMIDOL (ISOVUE-300) INJECTION 61% COMPARISON:  12/23/2016 FINDINGS: Lower chest: No acute abnormality. Hepatobiliary: Increased echotexture compatible with fatty infiltration. No focal abnormality or biliary ductal dilatation. Pancreas: No focal abnormality or ductal dilatation. Spleen: No focal abnormality.  Normal size. Adrenals/Urinary Tract: No adrenal abnormality. No focal renal abnormality. No stones or hydronephrosis. Urinary bladder is unremarkable. Stomach/Bowel: Diffuse colonic diverticulosis, most pronounced in the sigmoid colon. Inflammatory stranding noted around the mid sigmoid colon compatible with active diverticulitis. Stomach and small bowel decompressed, unremarkable. Vascular/Lymphatic: Aortic atherosclerosis. No enlarged abdominal or pelvic lymph nodes. Reproductive: Prior hysterectomy.  No adnexal masses. Other: No free fluid or free air. Musculoskeletal: Degenerative changes in the lumbar spine. No acute bony abnormality. IMPRESSION: Diffuse colonic diverticulosis. Inflammatory stranding around the sigmoid colon compatible with active diverticulitis.  Fatty infiltration of the liver. Aortoiliac atherosclerosis. Electronically Signed   By: Rolm Baptise M.D.   On: 06/30/2017 09:19   Assessment/Plan Principal Problem:   Acute diverticulitis Active Problems:   Mixed hyperlipidemia   Hypothyroidism   GERD (gastroesophageal reflux disease)   LLQ pain   Rectal bleeding   Leukocytosis   1. Acute diverticulitis - Admit for inpatient management with IV fluids, IV antibiotics with cipro and metronidazole, bowel rest, pain and nausea treatment.  Follow WBC and clinical course.  Advance diet as tolerated.   2. Rectal bleeding - maroon stools and heme positive stools likely secondary to acute diverticulitis and diverticulosis, GI to see.  Holding aspirin for now.  Holding lovenox.  SCDs for DVT prophylaxis. Continue supportive care and follow hemoglobin.  3. Leukocytosis -  secondary to acute diverticulitis - treat with IV antibiotics and supportive care, check CBC in AM.   4. Abdominal pain, LLQ - treating as above, oral and IV pain management ordered.  Nausea medications ordered.  5. GERD - continue protonix daily for GI protection.  6. Hyperlipidemia - resume home simvastatin therapy.   DVT Prophylaxis: SCDs Code Status: Full   Family Communication: patient   Disposition Plan: Anticipate 2-3 days of inpatient care followed by discharge home with outpatient follow up   Time spent: 99 mins  Irwin Brakeman, MD [facility] Pager [phone]  If 7PM-7AM, please contact night-coverage [email or web address] Password [facility] 06/30/2017, 10:25 AM

## 2017-06-30 NOTE — ED Provider Notes (Signed)
[facility] EMERGENCY DEPARTMENT Provider Note   CSN: [id number] Arrival date & time: 06/30/17  0601     History   Chief Complaint Chief Complaint  Patient presents with  . Rectal Bleeding    HPI Diane Torres is a 77 y.o. female.  HPI  Pt was seen at 0730.  Per pt, c/o gradual onset and persistence of constant lower abd "pain" since yesterday.  Has been associated with multiple intermittent episodes of rectal bleeding. States she feels like she needs to have a BM and "just passes blood." Pt states she has had several days of decreased appetite and was nauseated yesterday.   Describes the abd pain as "cramping."  Denies any further nausea, no vomiting/diarrhea, no fevers, no back pain, no rash, no CP/SOB, no black stools, no dysuria/hematuria.     GI: [facility]k Past Medical History:  Diagnosis Date  . Diverticulitis 2018  . GERD (gastroesophageal reflux disease)   . Hypothyroidism   . Impaired fasting glucose   . Mixed hyperlipidemia     Patient Active Problem List   Diagnosis Date Noted  . LLQ pain 12/23/2016  . GERD (gastroesophageal reflux disease) 06/16/2016  . Gastroesophageal reflux disease with esophagitis 06/16/2016  . Precordial pain 03/10/2013  . Impaired fasting glucose 03/09/2013  . Mixed hyperlipidemia 03/09/2013    Past Surgical History:  Procedure Laterality Date  . COLONOSCOPY    . COLONOSCOPY N/A 12/12/2015   Dr. Gala Romney: one 3 mm tubular adenoma in rectum s/p removal. Diverticulosis in entire colon. Internal hemorrhoids.   . ESOPHAGOGASTRODUODENOSCOPY N/A 03/19/2016   Dr. Gala Romney: LA Grade A esophagitis, empiric dilation, normal stomach, normal duodenum  . Left lobe thyroidectomy    . MALONEY DILATION N/A 03/19/2016   Procedure: Venia Minks DILATION;  Surgeon: Daneil Dolin, MD;  Location: [facility];  Service: Endoscopy;  Laterality: N/A;  . POLYPECTOMY  12/12/2015   Procedure: POLYPECTOMY;  Surgeon: Daneil Dolin, MD;  Location: [facility];  Service:  Endoscopy;;  colon  . RECTOCELE REPAIR    . TOTAL ABDOMINAL HYSTERECTOMY W/ BILATERAL SALPINGOOPHORECTOMY       OB History   None      Home Medications    Prior to Admission medications   Medication Sig Start Date End Date Taking? Authorizing Provider  aspirin EC 81 MG tablet Take 81 mg by mouth at bedtime.   Yes [provider]  loratadine (CLARITIN) 10 MG tablet Take 10 mg by mouth every morning.    Yes [provider]  pantoprazole (PROTONIX) 40 MG tablet TAKE ONE TABLET BY MOUTH ONCE DAILY. 08/07/16  Yes Mahala Menghini, PA-C  simvastatin (ZOCOR) 40 MG tablet Take 40 mg by mouth at bedtime.    Yes [provider]  acetaminophen (TYLENOL) 325 MG tablet Take 650 mg by mouth every 6 (six) hours as needed.    [provider]  diphenhydramine-acetaminophen (TYLENOL PM) 25-500 MG TABS tablet Take 1 tablet by mouth at bedtime as needed (RESTLESSNESS).    [provider]  fluconazole (DIFLUCAN) 150 MG tablet Take 1 tablet (150 mg total) by mouth daily. If needed once for yeast infection 12/29/16   Annitta Needs, NP    Family History Family History  Problem Relation Age of Onset  . CAD Brother        2 brothers with CAD in their 3s  . CAD Father   . Heart failure Mother   . Diabetes Mellitus II Mother  Social History Social History   Tobacco Use  . Smoking status: Never Smoker  . Smokeless tobacco: Never Used  Substance Use Topics  . Alcohol use: No  . Drug use: No     Allergies   Bee venom   Review of Systems Review of Systems ROS: Statement: All systems negative except as marked or noted in the HPI; Constitutional: Negative for fever and chills. ; ; Eyes: Negative for eye pain, redness and discharge. ; ; ENMT: Negative for ear pain, hoarseness, nasal congestion, sinus pressure and sore throat. ; ; Cardiovascular: Negative for chest pain, palpitations, diaphoresis, dyspnea and peripheral edema. ; ; Respiratory: Negative  for cough, wheezing and stridor. ; ; Gastrointestinal: Negative for nausea, vomiting, diarrhea, hematemesis, jaundice and +abd pain, rectal bleeding. . ; ; Genitourinary: Negative for dysuria, flank pain and hematuria. ; ; Musculoskeletal: Negative for back pain and neck pain. Negative for swelling and trauma.; ; Skin: Negative for pruritus, rash, abrasions, blisters, bruising and skin lesion.; ; Neuro: Negative for headache, lightheadedness and neck stiffness. Negative for weakness, altered level of consciousness, altered mental status, extremity weakness, paresthesias, involuntary movement, seizure and syncope.       Physical Exam Updated Vital Signs BP (!) 158/64 (BP Location: Right Arm)   Pulse (!) 108   Temp 98.6 F (37 C) (Oral)   Resp 18   Ht 5\' 1"  (1.549 m)   Wt 77.1 kg (170 lb)   SpO2 96%   BMI 32.12 kg/m    Patient Vitals for the past 24 hrs:  BP Temp Temp src Pulse Resp SpO2 Height Weight  06/30/17 1000 128/66 - - 73 - 95 % - -  06/30/17 0945 - - - 81 - 96 % - -  06/30/17 0930 100/83 - - - - - - -  06/30/17 0900 (!) 168/54 - - - - - - -  06/30/17 0830 (!) 122/55 - - 81 - 95 % - -  06/30/17 0800 (!) 118/58 - - 88 - 94 % - -  06/30/17 0616 (!) 158/64 98.6 F (37 C) Oral (!) 108 18 96 % - -  06/30/17 3557 - - - - - - 5\' 1"  (1.549 m) 77.1 kg (170 lb)    Physical Exam 0735: Physical examination:  Nursing notes reviewed; Vital signs and O2 SAT reviewed;  Constitutional: Well developed, Well nourished, Well hydrated, In no acute distress; Head:  Normocephalic, atraumatic; Eyes: EOMI, PERRL, No scleral icterus; ENMT: Mouth and pharynx normal, Mucous membranes moist; Neck: Supple, Full range of motion, No lymphadenopathy; Cardiovascular: Regular rate and rhythm, No gallop; Respiratory: Breath sounds clear & equal bilaterally, No wheezes.  Speaking full sentences with ease, Normal respiratory effort/excursion; Chest: Nontender, Movement normal; Abdomen: Soft, +LLQ tenderness to  palp. No rebound or guarding. Nondistended, Normal bowel sounds. Rectal exam performed w/permission of pt and ED Tech chaperone present.  Anal tone normal.  Non-tender, maroon stool in rectal vault, heme positive.  No fissures, no external hemorrhoids, no palp masses.;; Genitourinary: No CVA tenderness; Extremities: Peripheral pulses normal, No tenderness, No edema, No calf edema or asymmetry.; Neuro: AA&Ox3, Major CN grossly intact.  Speech clear. No gross focal motor or sensory deficits in extremities.; Skin: Color normal, Warm, Dry.   ED Treatments / Results  Labs (all labs ordered are listed, but only abnormal results are displayed)   EKG None  Radiology   Procedures Procedures (including critical care time)  Medications Ordered in ED Medications  ciprofloxacin (CIPRO) IVPB 400  mg (400 mg Intravenous New Bag/Given 06/30/17 1009)  metroNIDAZOLE (FLAGYL) IVPB 500 mg (500 mg Intravenous New Bag/Given 06/30/17 1009)  0.9 %  sodium chloride infusion ( Intravenous New Bag/Given 06/30/17 1009)  sodium chloride 0.9 % bolus 500 mL (0 mLs Intravenous Stopped 06/30/17 0940)  iopamidol (ISOVUE-300) 61 % injection 100 mL (80 mLs Intravenous Contrast Given 06/30/17 0851)     Initial Impression / Assessment and Plan / ED Course  I have reviewed the triage vital signs and the nursing notes.  Pertinent labs & imaging results that were available during my care of the patient were reviewed by me and considered in my medical decision making (see chart for details).  MDM Reviewed: previous chart, nursing note and vitals Reviewed previous: labs Interpretation: labs and CT scan   Results for orders placed or performed during the hospital encounter of 06/30/17  Comprehensive metabolic panel  Result Value Ref Range   Sodium 136 135 - 145 mmol/L   Potassium 4.1 3.5 - 5.1 mmol/L   Chloride 102 101 - 111 mmol/L   CO2 25 22 - 32 mmol/L   Glucose, Bld 136 (H) 65 - 99 mg/dL   BUN 21 (H) 6 - 20 mg/dL    Creatinine, Ser 1.17 (H) 0.44 - 1.00 mg/dL   Calcium 9.0 8.9 - 10.3 mg/dL   Total Protein 7.3 6.5 - 8.1 g/dL   Albumin 3.2 (L) 3.5 - 5.0 g/dL   AST 21 15 - 41 U/L   ALT 33 14 - 54 U/L   Alkaline Phosphatase 73 38 - 126 U/L   Total Bilirubin 1.0 0.3 - 1.2 mg/dL   GFR calc non Af Amer 44 (L) >60 mL/min   GFR calc Af Amer 51 (L) >60 mL/min   Anion gap 9 5 - 15  CBC with Differential  Result Value Ref Range   WBC 12.3 (H) 4.0 - 10.5 K/uL   RBC 4.38 3.87 - 5.11 MIL/uL   Hemoglobin 12.8 12.0 - 15.0 g/dL   HCT 40.4 36.0 - 46.0 %   MCV 92.2 78.0 - 100.0 fL   MCH 29.2 26.0 - 34.0 pg   MCHC 31.7 30.0 - 36.0 g/dL   RDW 15.7 (H) 11.5 - 15.5 %   Platelets 279 150 - 400 K/uL   Neutrophils Relative % 74 %   Neutro Abs 9.0 (H) 1.7 - 7.7 K/uL   Lymphocytes Relative 17 %   Lymphs Abs 2.1 0.7 - 4.0 K/uL   Monocytes Relative 7 %   Monocytes Absolute 0.9 0.1 - 1.0 K/uL   Eosinophils Relative 2 %   Eosinophils Absolute 0.3 0.0 - 0.7 K/uL   Basophils Relative 0 %   Basophils Absolute 0.0 0.0 - 0.1 K/uL  Protime-INR  Result Value Ref Range   Prothrombin Time 13.7 11.4 - 15.2 seconds   INR 1.06   APTT  Result Value Ref Range   aPTT 32 24 - 36 seconds  Lipase, blood  Result Value Ref Range   Lipase 31 11 - 51 U/L  Urinalysis, Routine w reflex microscopic  Result Value Ref Range   Color, Urine YELLOW YELLOW   APPearance HAZY (A) CLEAR   Specific Gravity, Urine 1.018 1.005 - 1.030   pH 6.0 5.0 - 8.0   Glucose, UA NEGATIVE NEGATIVE mg/dL   Hgb urine dipstick SMALL (A) NEGATIVE   Bilirubin Urine NEGATIVE NEGATIVE   Ketones, ur NEGATIVE NEGATIVE mg/dL   Protein, ur NEGATIVE NEGATIVE mg/dL   Nitrite NEGATIVE NEGATIVE  Leukocytes, UA MODERATE (A) NEGATIVE   RBC / HPF 21-50 0 - 5 RBC/hpf   WBC, UA 6-10 0 - 5 WBC/hpf   Bacteria, UA NONE SEEN NONE SEEN   Squamous Epithelial / LPF 0-5 0 - 5   Mucus PRESENT    Hyaline Casts, UA PRESENT   POC occult blood, ED  Result Value Ref Range   Fecal  Occult Bld POSITIVE (A) NEGATIVE  Sample to Blood Bank  Result Value Ref Range   Blood Bank Specimen SAMPLE AVAILABLE FOR TESTING    Sample Expiration      07/03/2017 Performed at [facility], [address] [place], [place] [address]    Ct Abdomen Pelvis W Contrast Result Date: 06/30/2017 CLINICAL DATA:  Bright red blood in stool.  Abdominal pain EXAM: CT ABDOMEN AND PELVIS WITH CONTRAST TECHNIQUE: Multidetector CT imaging of the abdomen and pelvis was performed using the standard protocol following bolus administration of intravenous contrast. CONTRAST:  25mL ISOVUE-300 IOPAMIDOL (ISOVUE-300) INJECTION 61% COMPARISON:  12/23/2016 FINDINGS: Lower chest: No acute abnormality. Hepatobiliary: Increased echotexture compatible with fatty infiltration. No focal abnormality or biliary ductal dilatation. Pancreas: No focal abnormality or ductal dilatation. Spleen: No focal abnormality.  Normal size. Adrenals/Urinary Tract: No adrenal abnormality. No focal renal abnormality. No stones or hydronephrosis. Urinary bladder is unremarkable. Stomach/Bowel: Diffuse colonic diverticulosis, most pronounced in the sigmoid colon. Inflammatory stranding noted around the mid sigmoid colon compatible with active diverticulitis. Stomach and small bowel decompressed, unremarkable. Vascular/Lymphatic: Aortic atherosclerosis. No enlarged abdominal or pelvic lymph nodes. Reproductive: Prior hysterectomy.  No adnexal masses. Other: No free fluid or free air. Musculoskeletal: Degenerative changes in the lumbar spine. No acute bony abnormality. IMPRESSION: Diffuse colonic diverticulosis. Inflammatory stranding around the sigmoid colon compatible with active diverticulitis. Fatty infiltration of the liver. Aortoiliac atherosclerosis. Electronically Signed   By: Rolm Baptise M.D.   On: 06/30/2017 09:19    1010:  H/H and BUN/Cr per baseline. No stooling while in the ED. Judicious IVF bolus given for soft BP with improvement. IV abx  started for acute diverticulitis. Dx and testing d/w pt and family.  Questions answered.  Verb understanding, agreeable to admit. T/C returned from [facility] Dr. Wynetta Emery, case discussed, including:  HPI, pertinent PM/SHx, VS/PE, dx testing, ED course and treatment:  Agreeable to admit.     Final Clinical Impressions(s) / ED Diagnoses   Final diagnoses:  None    ED Discharge Orders    None       Francine Graven, DO 07/04/17 1429

## 2017-06-30 NOTE — ED Notes (Signed)
Pt states she has decreased appetite for the past few days and experienced nausea yesterday.

## 2017-07-01 ENCOUNTER — Telehealth: Payer: Self-pay | Admitting: Gastroenterology

## 2017-07-01 ENCOUNTER — Encounter: Payer: Self-pay | Admitting: Gastroenterology

## 2017-07-01 DIAGNOSIS — K219 Gastro-esophageal reflux disease without esophagitis: Secondary | ICD-10-CM | POA: Diagnosis not present

## 2017-07-01 DIAGNOSIS — D72829 Elevated white blood cell count, unspecified: Secondary | ICD-10-CM | POA: Diagnosis not present

## 2017-07-01 DIAGNOSIS — K625 Hemorrhage of anus and rectum: Secondary | ICD-10-CM | POA: Diagnosis not present

## 2017-07-01 DIAGNOSIS — Z86010 Personal history of colonic polyps: Secondary | ICD-10-CM | POA: Diagnosis not present

## 2017-07-01 DIAGNOSIS — E890 Postprocedural hypothyroidism: Secondary | ICD-10-CM | POA: Diagnosis not present

## 2017-07-01 DIAGNOSIS — E039 Hypothyroidism, unspecified: Secondary | ICD-10-CM | POA: Diagnosis not present

## 2017-07-01 DIAGNOSIS — K5792 Diverticulitis of intestine, part unspecified, without perforation or abscess without bleeding: Secondary | ICD-10-CM | POA: Diagnosis not present

## 2017-07-01 DIAGNOSIS — N183 Chronic kidney disease, stage 3 (moderate): Secondary | ICD-10-CM | POA: Diagnosis not present

## 2017-07-01 DIAGNOSIS — E782 Mixed hyperlipidemia: Secondary | ICD-10-CM | POA: Diagnosis not present

## 2017-07-01 DIAGNOSIS — R1032 Left lower quadrant pain: Secondary | ICD-10-CM | POA: Diagnosis not present

## 2017-07-01 DIAGNOSIS — K5790 Diverticulosis of intestine, part unspecified, without perforation or abscess without bleeding: Secondary | ICD-10-CM | POA: Diagnosis not present

## 2017-07-01 LAB — COMPREHENSIVE METABOLIC PANEL
ALT: 24 U/L (ref 14–54)
ANION GAP: 8 (ref 5–15)
AST: 14 U/L — AB (ref 15–41)
Albumin: 2.7 g/dL — ABNORMAL LOW (ref 3.5–5.0)
Alkaline Phosphatase: 58 U/L (ref 38–126)
BILIRUBIN TOTAL: 0.6 mg/dL (ref 0.3–1.2)
BUN: 15 mg/dL (ref 6–20)
CALCIUM: 8.4 mg/dL — AB (ref 8.9–10.3)
CO2: 26 mmol/L (ref 22–32)
Chloride: 106 mmol/L (ref 101–111)
Creatinine, Ser: 1.03 mg/dL — ABNORMAL HIGH (ref 0.44–1.00)
GFR calc Af Amer: 59 mL/min — ABNORMAL LOW (ref >60)
GFR, EST NON AFRICAN AMERICAN: 51 mL/min — AB (ref >60)
Glucose, Bld: 92 mg/dL (ref 65–99)
POTASSIUM: 4.1 mmol/L (ref 3.5–5.1)
Sodium: 140 mmol/L (ref 135–145)
TOTAL PROTEIN: 6.3 g/dL — AB (ref 6.5–8.1)

## 2017-07-01 LAB — CBC
HCT: 35.2 % — ABNORMAL LOW (ref 36.0–46.0)
HEMOGLOBIN: 10.8 g/dL — AB (ref 12.0–15.0)
MCH: 28.9 pg (ref 26.0–34.0)
MCHC: 30.7 g/dL (ref 30.0–36.0)
MCV: 94.1 fL (ref 78.0–100.0)
PLATELETS: 249 10*3/uL (ref 150–400)
RBC: 3.74 MIL/uL — ABNORMAL LOW (ref 3.87–5.11)
RDW: 16 % — AB (ref 11.5–15.5)
WBC: 7.4 10*3/uL (ref 4.0–10.5)

## 2017-07-01 LAB — URINE CULTURE

## 2017-07-01 LAB — MAGNESIUM: MAGNESIUM: 2.1 mg/dL (ref 1.7–2.4)

## 2017-07-01 MED ORDER — INSULIN ASPART 100 UNIT/ML ~~LOC~~ SOLN: 5.0000 [IU] | Freq: Once | SUBCUTANEOUS | Status: DC

## 2017-07-01 MED ORDER — METRONIDAZOLE 500 MG PO TABS: 500.0000 mg | ORAL_TABLET | Freq: Three times a day (TID) | ORAL | Status: DC

## 2017-07-01 MED ORDER — FLUCONAZOLE 150 MG PO TABS: 150.0000 mg | ORAL_TABLET | Freq: Once | ORAL | 0 refills | Status: DC | PRN

## 2017-07-01 MED ORDER — METRONIDAZOLE 500 MG PO TABS: 500.0000 mg | ORAL_TABLET | Freq: Three times a day (TID) | ORAL | 0 refills | Status: AC

## 2017-07-01 MED ORDER — CIPROFLOXACIN HCL 500 MG PO TABS: 500.0000 mg | ORAL_TABLET | Freq: Two times a day (BID) | ORAL | 0 refills | Status: AC

## 2017-07-01 MED ORDER — HYOSCYAMINE SULFATE 0.125 MG SL SUBL: 0.2500 mg | SUBLINGUAL_TABLET | Freq: Once | SUBLINGUAL | Status: DC

## 2017-07-01 MED ORDER — HYDROCODONE-ACETAMINOPHEN 5-325 MG PO TABS: 1.0000 | ORAL_TABLET | Freq: Four times a day (QID) | ORAL | 0 refills | Status: AC | PRN

## 2017-07-01 MED ORDER — CIPROFLOXACIN HCL 250 MG PO TABS: 500.0000 mg | ORAL_TABLET | Freq: Two times a day (BID) | ORAL | Status: DC

## 2017-07-01 NOTE — Discharge Instructions (Signed)
Please stop taking aspirin.  Please eat a soft diet for the next week.  Please stop your cholesterol medication temporarily while you are taking the antibiotics and then restart the simvastatin. This is just as a precaution to prevent muscle aches and pains.    Follow with Primary MD  Asencion Noble, MD  and other consultants as instructed your Hospitalist MD  Please get a complete blood count and chemistry panel checked by your Primary MD at your next visit, and again as instructed by your Primary MD.  Get Medicines reviewed and adjusted: Please take all your medications with you for your next visit with your Primary MD  Laboratory/radiological data: Please request your Primary MD to go over all hospital tests and procedure/radiological results at the follow up, please ask your Primary MD to get all Hospital records sent to his/her office.  In some cases, they will be blood work, cultures and biopsy results pending at the time of your discharge. Please request that your primary care M.D. follows up on these results.  Also Note the following: If you experience worsening of your admission symptoms, develop shortness of breath, life threatening emergency, suicidal or homicidal thoughts you must seek medical attention immediately by calling 911 or calling your MD immediately  if symptoms less severe.  You must read complete instructions/literature along with all the possible adverse reactions/side effects for all the Medicines you take and that have been prescribed to you. Take any new Medicines after you have completely understood and accpet all the possible adverse reactions/side effects.   Do not drive when taking Pain medications or sleeping medications (Benzodaizepines)  Do not take more than prescribed Pain, Sleep and Anxiety Medications. It is not advisable to combine anxiety,sleep and pain medications without talking with your primary care practitioner  Special Instructions: If you have  smoked or chewed Tobacco  in the last 2 yrs please stop smoking, stop any regular Alcohol  and or any Recreational drug use.  Wear Seat belts while driving.  Please note: You were cared for by a hospitalist during your hospital stay. Once you are discharged, your primary care physician will handle any further medical issues. Please note that NO REFILLS for any discharge medications will be authorized once you are discharged, as it is imperative that you return to your primary care physician (or establish a relationship with a primary care physician if you do not have one) for your post hospital discharge needs so that they can reassess your need for medications and monitor your lab values.      Diverticulitis Diverticulitis is when small pockets in your large intestine (colon) get infected or swollen. This causes stomach pain and watery poop (diarrhea). These pouches are called diverticula. They form in people who have a condition called diverticulosis. Follow these instructions at home: Medicines  Take over-the-counter and prescription medicines only as told by your doctor. These include: ? Antibiotics. ? Pain medicines. ? Fiber pills. ? Probiotics. ? Stool softeners.  Do not drive or use heavy machinery while taking prescription pain medicine.  If you were prescribed an antibiotic, take it as told. Do not stop taking it even if you feel better. General instructions  Follow a diet as told by your doctor.  When you feel better, your doctor may tell you to change your diet. You may need to eat a lot of fiber. Fiber makes it easier to poop (have bowel movements). Healthy foods with fiber include: ? Berries. ? Beans. ? Lentils. ?  Green vegetables.  Exercise 3 or more times a week. Aim for 30 minutes each time. Exercise enough to sweat and make your heart beat faster.  Keep all follow-up visits as told. This is important. You may need to have an exam of the large intestine. This is  called a colonoscopy. Contact a doctor if:  Your pain does not get better.  You have a hard time eating or drinking.  You are not pooping like normal. Get help right away if:  Your pain gets worse.  Your problems do not get better.  Your problems get worse very fast.  You have a fever.  You throw up (vomit) more than one time.  You have poop that is: ? Bloody. ? Black. ? Tarry. Summary  Diverticulitis is when small pockets in your large intestine (colon) get infected or swollen.  Take medicines only as told by your doctor.  Follow a diet as told by your doctor. This information is not intended to replace advice given to you by your health care provider. Make sure you discuss any questions you have with your health care provider. Document Released: 07/09/2007 Document Revised: 02/07/2016 Document Reviewed: 02/07/2016 Elsevier Interactive Patient Education  2017 [place].     Rectal Bleeding Rectal bleeding is when blood comes out of the opening of the butt (anus). People with this kind of bleeding may notice bright red blood in their underwear or in the toilet after they poop (have a bowel movement). They may also have dark red or black poop (stool). Rectal bleeding is often a sign that something is wrong. It needs to be checked by a doctor. Follow these instructions at home: Watch for any changes in your condition. Take these actions to help with bleeding and discomfort:  Eat a diet that is high in fiber. This will keep your poop soft so it is easier for you to poop without pushing too hard. Ask your doctor to tell you what foods and drinks are high in fiber.  Drink enough fluid to keep your pee (urine) clear or pale yellow. This also helps keep your poop soft.  Try taking a warm bath. This may help with pain.  Keep all follow-up visits as told by your doctor. This is important.  Get help right away if:  You have new bleeding.  You have more bleeding than  before.  You have black or dark red poop.  You throw up (vomit) blood or something that looks like coffee grounds.  You have pain or tenderness in your belly (abdomen).  You have a fever.  You feel weak.  You feel sick to your stomach (nauseous).  You pass out (faint).  You have very bad pain in your butt.  You cannot poop. This information is not intended to replace advice given to you by your health care provider. Make sure you discuss any questions you have with your health care provider. Document Released: 10/02/2010 Document Revised: 06/28/2015 Document Reviewed: 03/18/2015 Elsevier Interactive Patient Education  2018 [place] Meal Plan A soft-food meal plan includes foods that are safe and easy to swallow. This meal plan typically is used:  If you are having trouble chewing or swallowing foods.  As a transition meal plan after only having had liquid meals for a long period.  What do I need to know about the soft-food meal plan? A soft-food meal plan includes tender foods that are soft and easy to chew and swallow. In most cases,  bite-sized pieces of food are easier to swallow. A bite-sized piece is about  inch or smaller. Foods in this plan do not need to be ground or pureed. Foods that are very hard, crunchy, or sticky should be avoided. Also, breads, cereals, yogurts, and desserts with nuts, seeds, or fruits should be avoided. What foods can I eat? Grains Rice and wild rice. Moist bread, dressing, pasta, and noodles. Well-moistened dry or cooked cereals, such as farina (cooked wheat cereal), oatmeal, or grits. Biscuits, breads, muffins, pancakes, and waffles that have been well moistened. Vegetables Shredded lettuce. Cooked, tender vegetables, including potatoes without skins. Vegetable juices. Broths or creamed soups made with vegetables that are not stringy or chewy. Strained tomatoes (without seeds). Fruits Canned or well-cooked fruits. Soft (ripe),  peeled fresh fruits, such as peaches, nectarines, kiwi, cantaloupe, honeydew melon, and watermelon (without seeds). Soft berries with small seeds, such as strawberries. Fruit juices (without pulp). Meats and Other Protein Sources Moist, tender, lean beef. Mutton. Lamb. Veal. Chicken. [place]. Liver. Ham. Fish without bones. Eggs. Dairy Milk, milk drinks, and cream. Plain cream cheese and cottage cheese. Plain yogurt. Sweets/Desserts Flavored gelatin desserts. Custard. Plain ice cream, frozen yogurt, sherbet, milk shakes, and malts. Plain cakes and cookies. Plain hard candy. Other Butter, margarine (without trans fat), and cooking oils. Mayonnaise. Cream sauces. Mild spices, salt, and sugar. Syrup, molasses, honey, and jelly. The items listed above may not be a complete list of recommended foods or beverages. Contact your dietitian for more options. What foods are not recommended? Grains Dry bread, toast, crackers that have not been moistened. Coarse or dry cereals, such as bran, granola, and shredded wheat. Tough or chewy crusty breads, such as [place] bread or baguettes. Vegetables Corn. Raw vegetables except shredded lettuce. Cooked vegetables that are tough or stringy. Tough, crisp, fried potatoes and potato skins. Fruits Fresh fruits with skins or seeds or both, such as apples, pears, or grapes. Stringy, high-pulp fruits, such as papaya, pineapple, coconut, or mango. Fruit leather, fruit roll-ups, and all dried fruits. Meats and Other Protein Sources Sausages and hot dogs. Meats with gristle. Fish with bones. Nuts, seeds, and chunky peanut or other nut butters. Sweets/Desserts Cakes or cookies that are very dry or chewy. The items listed above may not be a complete list of foods and beverages to avoid. Contact your dietitian for more information. This information is not intended to replace advice given to you by your health care provider. Make sure you discuss any questions you have with your  health care provider. Document Released: 04/29/2007 Document Revised: 06/28/2015 Document Reviewed: 12/17/2012 Elsevier Interactive Patient Education  2017 [name].

## 2017-07-01 NOTE — Progress Notes (Signed)
Subjective:  Feels better this morning. Wants to go home. No rectal bleeding since admission, last episode at home. Abdominal pain improved. Tolerating soft diet.   Objective: Vital signs in last 24 hours: Temp:  [97.4 F (36.3 C)-98.9 F (37.2 C)] 97.4 F (36.3 C) (05/29 0651) Pulse Rate:  [73-88] 75 (05/29 0651) Resp:  [14-17] 14 (05/29 0651) BP: (100-168)/(54-83) 159/61 (05/29 0651) SpO2:  [94 %-97 %] 97 % (05/29 0651) Weight:  [164 lb 14.5 oz (74.8 kg)-165 lb 12.6 oz (75.2 kg)] 165 lb 12.6 oz (75.2 kg) (05/29 0500) Last BM Date: 06/29/17 General:   Alert,  Well-developed, well-nourished, pleasant and cooperative in NAD Head:  Normocephalic and atraumatic. Eyes:  Sclera clear, no icterus.  Abdomen:  Soft,  nondistended. Mild llq tenderness. Normal bowel sounds, without guarding, and without rebound.   Extremities:  Without clubbing, deformity or edema. Neurologic:  Alert and  oriented x4;  grossly normal neurologically. Skin:  Intact without significant lesions or rashes. Psych:  Alert and cooperative. Normal mood and affect.  Intake/Output from previous day: 05/28 0701 - 05/29 0700 In: 995 [P.O.:360; I.V.:135; IV Piggyback:500] Out: 400 [Urine:400] Intake/Output this shift: No intake/output data recorded.  Lab Results: CBC Recent Labs    06/30/17 0645 07/01/17 0434  WBC 12.3* 7.4  HGB 12.8 10.8*  HCT 40.4 35.2*  MCV 92.2 94.1  PLT 279 249   BMET Recent Labs    06/30/17 0645 07/01/17 0434  NA 136 140  K 4.1 4.1  CL 102 106  CO2 25 26  GLUCOSE 136* 92  BUN 21* 15  CREATININE 1.17* 1.03*  CALCIUM 9.0 8.4*   LFTs Recent Labs    06/30/17 0645 07/01/17 0434  BILITOT 1.0 0.6  ALKPHOS 73 58  AST 21 14*  ALT 33 24  PROT 7.3 6.3*  ALBUMIN 3.2* 2.7*   Recent Labs    06/30/17 0645  LIPASE 31   PT/INR Recent Labs    06/30/17 0645  LABPROT 13.7  INR 1.06      Imaging Studies: Ct Abdomen Pelvis W Contrast  Result Date: 06/30/2017 CLINICAL  DATA:  Bright red blood in stool.  Abdominal pain EXAM: CT ABDOMEN AND PELVIS WITH CONTRAST TECHNIQUE: Multidetector CT imaging of the abdomen and pelvis was performed using the standard protocol following bolus administration of intravenous contrast. CONTRAST:  70mL ISOVUE-300 IOPAMIDOL (ISOVUE-300) INJECTION 61% COMPARISON:  12/23/2016 FINDINGS: Lower chest: No acute abnormality. Hepatobiliary: Increased echotexture compatible with fatty infiltration. No focal abnormality or biliary ductal dilatation. Pancreas: No focal abnormality or ductal dilatation. Spleen: No focal abnormality.  Normal size. Adrenals/Urinary Tract: No adrenal abnormality. No focal renal abnormality. No stones or hydronephrosis. Urinary bladder is unremarkable. Stomach/Bowel: Diffuse colonic diverticulosis, most pronounced in the sigmoid colon. Inflammatory stranding noted around the mid sigmoid colon compatible with active diverticulitis. Stomach and small bowel decompressed, unremarkable. Vascular/Lymphatic: Aortic atherosclerosis. No enlarged abdominal or pelvic lymph nodes. Reproductive: Prior hysterectomy.  No adnexal masses. Other: No free fluid or free air. Musculoskeletal: Degenerative changes in the lumbar spine. No acute bony abnormality. IMPRESSION: Diffuse colonic diverticulosis. Inflammatory stranding around the sigmoid colon compatible with active diverticulitis. Fatty infiltration of the liver. Aortoiliac atherosclerosis. Electronically Signed   By: Rolm Baptise M.D.   On: 06/30/2017 09:19  [2 weeks]   Assessment:  77 year old female admitted with abdominal pain and rectal bleeding, with CT scan noting uncomplicated sigmoid diverticulitis. Previously treated as outpatient in Nov 2018 with distal descending colon diverticulitis. Initial episode in 2015  involving proximal sigmoid and rectosigmoid. Clinically, she has improved and has had no further rectal bleeding since admission. Typically, rectal bleeding is not routinely  seen with diverticulitis:iInterestingly, she notes that she also had rectal bleeding in 2015 with initial episode. Colonoscopy fairly recently on file from Nov 2017.   Clinically patient improved, tolerating diet and decreased abdominal pain and has not required pain medication since admission. Patient desires discharge.   Plan: 1. Consider transition IV antibiotics to oral after next dose of IV antibiotics.  2. Consider discharge today if tolerates oral antibiotics.  3. Low residue diet for next 1-2 weeks, then slowly increase dietary fiber and add fiber supplement (3-4 grams daily).  4. Return for office visit in 2 months.   Laureen Ochs. Bernarda Caffey [facility] [phone] 5/29/20198:46 AM     LOS: 1 day

## 2017-07-01 NOTE — Discharge Summary (Addendum)
Physician Discharge Summary  ROMELLE MULDOON ZOX:[id number] DOB: 06/16/1940 DOA: 06/30/2017  PCP: Asencion Noble, MD GI: Rehman  Admit date: 06/30/2017 Discharge date: 07/01/2017  Admitted From: Home  Disposition: Home   Recommendations for Outpatient Follow-up:  1. Follow up with PCP in 1 weeks 2. Please check CBC, BMP in 1 week 3. Follow up with GI in 6-8 weeks  Discharge Condition: STABLE   CODE STATUS: FULL    Brief Hospitalization Summary: Please see all hospital notes, images, labs for full details of the hospitalization.  HPI: Diane Torres is a 77 y.o. female with known history of colonic diverticulosis presented to ED with complaints of gradual onset of lower abdominal pain that started yesterday.  This has been associated with multiple episodes of rectal bleeding.  She says that she has the urge to defecate but it is associated with rectal bleeding and no stool.  She says that the first episode of defecation was associated with a small amount of blood but the second episode was associated with a large amount of blood in the toilet.  She says that she has been having cramping abdominal pain in the LLQ, no emesis.  She had some nausea yesterday but that has resolved for the most part.  She denies black stools. She does have history of internal hemorrhoids.  She had a colonoscopy in 2017 with findings of diverticulosis, tubular adenoma and internal hemorrhoids done by Dr. Gala Romney.    ED course:  Pt was noted to have maroon stool and hemoccult positive.  She had a normal hemoglobin.  Her CT scan was positive for acute diverticulitis. She has an elevated WBC and no fever but had some soft blood pressures that responded to fluid challenges.  She is being admitted for further management.   The patient was admitted into the hospital and treated with IV ciprofloxacin and metronidazole for acute diverticulitis.  She had good results with this treatment.  She was given supportive care with IV fluids.   Her white blood cell count improved to normal.  She was also seen by the GI team because she was having rectal bleeding and maroon-colored stools that were heme positive.  The patient was taken off aspirin because the risks of bleeding outweigh the benefits.  The patient improved and diet was advanced.  The patient tolerated a low residue diet prior to discharge.  Her abdominal pain significantly improved.  She was asking to go home.  She will follow-up with GI in 6 to 8 weeks.  She can follow-up with her primary care physician in 1 week.  She was given a 10-day course of Cipro and metronidazole to complete treatment.  In addition, she was given a prescription for fluconazole to use if she develops yeast infection which she has in the past.  I have given recommendations for her to discontinue aspirin and continue low residue diet for at least 1 week.  Discharge Diagnoses:  Principal Problem:   Acute diverticulitis Active Problems:   Mixed hyperlipidemia   Hypothyroidism   GERD (gastroesophageal reflux disease)   LLQ pain   Rectal bleeding   Leukocytosis   CKD (chronic kidney disease) stage 3, GFR 30-59 ml/min [facility])  Discharge Instructions: Discharge Instructions    Call MD for:  extreme fatigue   Complete by:  As directed    Call MD for:  persistant dizziness or light-headedness   Complete by:  As directed    Call MD for:  persistant nausea and vomiting  Complete by:  As directed    Call MD for:  severe uncontrolled pain   Complete by:  As directed    Increase activity slowly   Complete by:  As directed      Allergies as of 07/01/2017      Reactions   Bee Venom Anaphylaxis      Medication List    STOP taking these medications   aspirin EC 81 MG tablet     TAKE these medications   acetaminophen 325 MG tablet Commonly known as:  TYLENOL Take 650 mg by mouth every 6 (six) hours as needed.   ciprofloxacin 500 MG tablet Commonly known as:  CIPRO Take 1 tablet (500 mg total)  by mouth 2 (two) times daily for 10 days.   fluconazole 150 MG tablet Commonly known as:  DIFLUCAN Take 1 tablet (150 mg total) by mouth once as needed for up to 1 dose (yeast infection). Repeat if needed   HYDROcodone-acetaminophen 5-325 MG tablet Commonly known as:  NORCO/VICODIN Take 1 tablet by mouth every 6 (six) hours as needed for up to 3 days for severe pain.   loratadine 10 MG tablet Commonly known as:  CLARITIN Take 10 mg by mouth every morning.   metroNIDAZOLE 500 MG tablet Commonly known as:  FLAGYL Take 1 tablet (500 mg total) by mouth every 8 (eight) hours for 10 days.   pantoprazole 40 MG tablet Commonly known as:  PROTONIX TAKE ONE TABLET BY MOUTH ONCE DAILY.   simvastatin 40 MG tablet Commonly known as:  ZOCOR Take 40 mg by mouth at bedtime.      Follow-up Information    Asencion Noble, MD. Schedule an appointment as soon as possible for a visit in 1 week(s).   Specialty:  Internal Medicine Contact information: [address] [place] [address] [phone]          Allergies  Allergen Reactions  . Bee Venom Anaphylaxis   Allergies as of 07/01/2017      Reactions   Bee Venom Anaphylaxis      Medication List    STOP taking these medications   aspirin EC 81 MG tablet     TAKE these medications   acetaminophen 325 MG tablet Commonly known as:  TYLENOL Take 650 mg by mouth every 6 (six) hours as needed.   ciprofloxacin 500 MG tablet Commonly known as:  CIPRO Take 1 tablet (500 mg total) by mouth 2 (two) times daily for 10 days.   fluconazole 150 MG tablet Commonly known as:  DIFLUCAN Take 1 tablet (150 mg total) by mouth once as needed for up to 1 dose (yeast infection). Repeat if needed   HYDROcodone-acetaminophen 5-325 MG tablet Commonly known as:  NORCO/VICODIN Take 1 tablet by mouth every 6 (six) hours as needed for up to 3 days for severe pain.   loratadine 10 MG tablet Commonly known as:  CLARITIN Take 10 mg by mouth  every morning.   metroNIDAZOLE 500 MG tablet Commonly known as:  FLAGYL Take 1 tablet (500 mg total) by mouth every 8 (eight) hours for 10 days.   pantoprazole 40 MG tablet Commonly known as:  PROTONIX TAKE ONE TABLET BY MOUTH ONCE DAILY.   simvastatin 40 MG tablet Commonly known as:  ZOCOR Take 40 mg by mouth at bedtime.      Procedures/Studies: Ct Abdomen Pelvis W Contrast  Result Date: 06/30/2017 CLINICAL DATA:  Bright red blood in stool.  Abdominal pain EXAM: CT ABDOMEN AND  PELVIS WITH CONTRAST TECHNIQUE: Multidetector CT imaging of the abdomen and pelvis was performed using the standard protocol following bolus administration of intravenous contrast. CONTRAST:  72mL ISOVUE-300 IOPAMIDOL (ISOVUE-300) INJECTION 61% COMPARISON:  12/23/2016 FINDINGS: Lower chest: No acute abnormality. Hepatobiliary: Increased echotexture compatible with fatty infiltration. No focal abnormality or biliary ductal dilatation. Pancreas: No focal abnormality or ductal dilatation. Spleen: No focal abnormality.  Normal size. Adrenals/Urinary Tract: No adrenal abnormality. No focal renal abnormality. No stones or hydronephrosis. Urinary bladder is unremarkable. Stomach/Bowel: Diffuse colonic diverticulosis, most pronounced in the sigmoid colon. Inflammatory stranding noted around the mid sigmoid colon compatible with active diverticulitis. Stomach and small bowel decompressed, unremarkable. Vascular/Lymphatic: Aortic atherosclerosis. No enlarged abdominal or pelvic lymph nodes. Reproductive: Prior hysterectomy.  No adnexal masses. Other: No free fluid or free air. Musculoskeletal: Degenerative changes in the lumbar spine. No acute bony abnormality. IMPRESSION: Diffuse colonic diverticulosis. Inflammatory stranding around the sigmoid colon compatible with active diverticulitis. Fatty infiltration of the liver. Aortoiliac atherosclerosis. Electronically Signed   By: Rolm Baptise M.D.   On: 06/30/2017 09:19       Subjective: Pt says she feels much better, tolerating diet and would like to go home.   Discharge Exam: Vitals:   07/01/17 0651 07/01/17 0737  BP: (!) 159/61 (!) 154/61  Pulse: 75 70  Resp: 14 17  Temp: (!) 97.4 F (36.3 C) 97.7 F (36.5 C)  SpO2: 97% 95%   Vitals:   06/30/17 2201 07/01/17 0500 07/01/17 0651 07/01/17 0737  BP: (!) 156/68  (!) 159/61 (!) 154/61  Pulse: 78  75 70  Resp: 17  14 17   Temp: 98.3 F (36.8 C)  (!) 97.4 F (36.3 C) 97.7 F (36.5 C)  TempSrc: Oral  Oral Oral  SpO2: 97%  97% 95%  Weight:  75.2 kg (165 lb 12.6 oz)    Height:       General: Pt is alert, awake, not in acute distress Cardiovascular: RRR, S1/S2 +, no rubs, no gallops Respiratory: CTA bilaterally, no wheezing, no rhonchi Abdominal: Soft, very mild LLQ tenderness, much improved from prior exam, ND, bowel sounds + Extremities: no edema, no cyanosis   The results of significant diagnostics from this hospitalization (including imaging, microbiology, ancillary and laboratory) are listed below for reference.     Microbiology: Recent Results (from the past 240 hour(s))  Urine culture     Status: Abnormal   Collection Time: 06/30/17  7:29 AM  Result Value Ref Range Status   Specimen Description   Final    URINE, CLEAN CATCH Performed at [facility], [address], [place], [place] [address]    Special Requests   Final    NONE Performed at [facility], [address] [place], [place] [address]    Culture MULTIPLE SPECIES PRESENT, SUGGEST RECOLLECTION (A)  Final   Report Status 07/01/2017 FINAL  Final     Labs: BNP (last 3 results) No results for input(s): BNP in the last 8760 hours. Basic Metabolic Panel: Recent Labs  Lab 06/30/17 0645 07/01/17 0434  NA 136 140  K 4.1 4.1  CL 102 106  CO2 25 26  GLUCOSE 136* 92  BUN 21* 15  CREATININE 1.17* 1.03*  CALCIUM 9.0 8.4*  MG  --  2.1   Liver Function Tests: Recent Labs  Lab 06/30/17 0645 07/01/17 0434  AST 21 14*  ALT  33 24  ALKPHOS 73 58  BILITOT 1.0 0.6  PROT 7.3 6.3*  ALBUMIN 3.2* 2.7*  Recent Labs  Lab 06/30/17 0645  LIPASE 31   No results for input(s): AMMONIA in the last 168 hours. CBC: Recent Labs  Lab 06/30/17 0645 07/01/17 0434  WBC 12.3* 7.4  NEUTROABS 9.0*  --   HGB 12.8 10.8*  HCT 40.4 35.2*  MCV 92.2 94.1  PLT 279 249   Cardiac Enzymes: No results for input(s): CKTOTAL, CKMB, CKMBINDEX, TROPONINI in the last 168 hours. BNP: Invalid input(s): POCBNP CBG: No results for input(s): GLUCAP in the last 168 hours. D-Dimer No results for input(s): DDIMER in the last 72 hours. Hgb A1c No results for input(s): HGBA1C in the last 72 hours. Lipid Profile No results for input(s): CHOL, HDL, LDLCALC, TRIG, CHOLHDL, LDLDIRECT in the last 72 hours. Thyroid function studies No results for input(s): TSH, T4TOTAL, T3FREE, THYROIDAB in the last 72 hours.  Invalid input(s): FREET3 Anemia work up No results for input(s): VITAMINB12, FOLATE, FERRITIN, TIBC, IRON, RETICCTPCT in the last 72 hours. Urinalysis    Component Value Date/Time   COLORURINE YELLOW 06/30/2017 0750   APPEARANCEUR HAZY (A) 06/30/2017 0750   LABSPEC 1.018 06/30/2017 0750   PHURINE 6.0 06/30/2017 0750   GLUCOSEU NEGATIVE 06/30/2017 0750   HGBUR SMALL (A) 06/30/2017 0750   BILIRUBINUR NEGATIVE 06/30/2017 0750   KETONESUR NEGATIVE 06/30/2017 0750   PROTEINUR NEGATIVE 06/30/2017 0750   UROBILINOGEN 0.2 09/07/2006 1322   NITRITE NEGATIVE 06/30/2017 0750   LEUKOCYTESUR MODERATE (A) 06/30/2017 0750   Sepsis Labs Invalid input(s): PROCALCITONIN,  WBC,  LACTICIDVEN Microbiology Recent Results (from the past 240 hour(s))  Urine culture     Status: Abnormal   Collection Time: 06/30/17  7:29 AM  Result Value Ref Range Status   Specimen Description   Final    URINE, CLEAN CATCH Performed at [facility], [address] [place], [place] [address]    Special Requests   Final    NONE Performed at [facility], [address], [place], [place] [address]    Culture MULTIPLE SPECIES PRESENT, SUGGEST RECOLLECTION (A)  Final   Report Status 07/01/2017 FINAL  Final    Time coordinating discharge:   SIGNED:  Irwin Brakeman, MD  [facility] 07/01/2017, 12:05 PM Pager [phone]  If 7PM-7AM, please contact night-coverage [email or web address] Password [email or web address]

## 2017-07-01 NOTE — Telephone Encounter (Signed)
PATIENT WAS SCHEDULED AND LETTER SENT

## 2017-07-01 NOTE — Telephone Encounter (Signed)
Please make hospital follow up in 6-8 weeks with RMR, AB, or LSL.

## 2017-07-01 NOTE — Progress Notes (Signed)
Removed IV, 2x2 gauze and paper tape applied to site, patient tolerated well.  Reviewed AVS/ discharge information with patient and patient's husband, both verbalized understanding.  Patient transported home by husband.

## 2017-07-06 DIAGNOSIS — M25561 Pain in right knee: Secondary | ICD-10-CM | POA: Diagnosis not present

## 2017-07-06 DIAGNOSIS — M47816 Spondylosis without myelopathy or radiculopathy, lumbar region: Secondary | ICD-10-CM | POA: Diagnosis not present

## 2017-07-07 DIAGNOSIS — K625 Hemorrhage of anus and rectum: Secondary | ICD-10-CM | POA: Diagnosis not present

## 2017-07-07 DIAGNOSIS — Z6831 Body mass index (BMI) 31.0-31.9, adult: Secondary | ICD-10-CM | POA: Diagnosis not present

## 2017-07-07 DIAGNOSIS — K5700 Diverticulitis of small intestine with perforation and abscess without bleeding: Secondary | ICD-10-CM | POA: Diagnosis not present

## 2017-08-21 DIAGNOSIS — H2513 Age-related nuclear cataract, bilateral: Secondary | ICD-10-CM | POA: Diagnosis not present

## 2017-09-16 ENCOUNTER — Other Ambulatory Visit (HOSPITAL_COMMUNITY): Payer: Self-pay | Admitting: Internal Medicine

## 2017-09-16 DIAGNOSIS — Z1231 Encounter for screening mammogram for malignant neoplasm of breast: Secondary | ICD-10-CM

## 2017-09-21 ENCOUNTER — Ambulatory Visit (HOSPITAL_COMMUNITY)
Admission: RE | Admit: 2017-09-21 | Discharge: 2017-09-21 | Disposition: A | Discharge status: Home / Self Care | Payer: Medicare Other | Source: Ambulatory Visit | Attending: Internal Medicine | Admitting: Internal Medicine

## 2017-09-21 DIAGNOSIS — Z1231 Encounter for screening mammogram for malignant neoplasm of breast: Secondary | ICD-10-CM | POA: Insufficient documentation

## 2017-09-29 ENCOUNTER — Ambulatory Visit: Payer: Medicare Other | Admitting: Gastroenterology

## 2017-10-06 DIAGNOSIS — R7301 Impaired fasting glucose: Secondary | ICD-10-CM | POA: Diagnosis not present

## 2017-10-06 DIAGNOSIS — Z79899 Other long term (current) drug therapy: Secondary | ICD-10-CM | POA: Diagnosis not present

## 2017-10-06 DIAGNOSIS — N183 Chronic kidney disease, stage 3 (moderate): Secondary | ICD-10-CM | POA: Diagnosis not present

## 2017-10-06 DIAGNOSIS — E039 Hypothyroidism, unspecified: Secondary | ICD-10-CM | POA: Diagnosis not present

## 2017-10-06 DIAGNOSIS — E785 Hyperlipidemia, unspecified: Secondary | ICD-10-CM | POA: Diagnosis not present

## 2017-10-13 DIAGNOSIS — N183 Chronic kidney disease, stage 3 (moderate): Secondary | ICD-10-CM | POA: Diagnosis not present

## 2017-10-13 DIAGNOSIS — R7301 Impaired fasting glucose: Secondary | ICD-10-CM | POA: Diagnosis not present

## 2017-10-13 DIAGNOSIS — E785 Hyperlipidemia, unspecified: Secondary | ICD-10-CM | POA: Diagnosis not present

## 2017-10-13 DIAGNOSIS — I700 Atherosclerosis of aorta: Secondary | ICD-10-CM | POA: Diagnosis not present

## 2017-11-10 DIAGNOSIS — L728 Other follicular cysts of the skin and subcutaneous tissue: Secondary | ICD-10-CM | POA: Diagnosis not present

## 2017-11-10 DIAGNOSIS — B078 Other viral warts: Secondary | ICD-10-CM | POA: Diagnosis not present

## 2017-11-10 DIAGNOSIS — L820 Inflamed seborrheic keratosis: Secondary | ICD-10-CM | POA: Diagnosis not present

## 2017-11-17 DIAGNOSIS — Z23 Encounter for immunization: Secondary | ICD-10-CM | POA: Diagnosis not present

## 2018-02-09 DIAGNOSIS — I10 Essential (primary) hypertension: Secondary | ICD-10-CM | POA: Diagnosis not present

## 2018-03-30 DIAGNOSIS — Z6833 Body mass index (BMI) 33.0-33.9, adult: Secondary | ICD-10-CM | POA: Diagnosis not present

## 2018-03-30 DIAGNOSIS — I10 Essential (primary) hypertension: Secondary | ICD-10-CM | POA: Diagnosis not present

## 2018-05-05 DIAGNOSIS — J189 Pneumonia, unspecified organism: Secondary | ICD-10-CM

## 2018-05-05 HISTORY — DX: Pneumonia, unspecified organism: J18.9

## 2018-05-21 ENCOUNTER — Emergency Department (HOSPITAL_COMMUNITY)
Admission: EM | Admit: 2018-05-21 | Discharge: 2018-05-21 | Disposition: A | Discharge status: Home / Self Care | Payer: Medicare Other | Attending: Emergency Medicine | Admitting: Emergency Medicine

## 2018-05-21 ENCOUNTER — Encounter (HOSPITAL_COMMUNITY): Payer: Self-pay | Admitting: Emergency Medicine

## 2018-05-21 ENCOUNTER — Emergency Department (HOSPITAL_COMMUNITY): Payer: Medicare Other

## 2018-05-21 ENCOUNTER — Other Ambulatory Visit: Payer: Self-pay

## 2018-05-21 DIAGNOSIS — J189 Pneumonia, unspecified organism: Secondary | ICD-10-CM | POA: Diagnosis not present

## 2018-05-21 DIAGNOSIS — J181 Lobar pneumonia, unspecified organism: Secondary | ICD-10-CM | POA: Diagnosis not present

## 2018-05-21 DIAGNOSIS — R0602 Shortness of breath: Secondary | ICD-10-CM | POA: Diagnosis not present

## 2018-05-21 DIAGNOSIS — Z79899 Other long term (current) drug therapy: Secondary | ICD-10-CM | POA: Insufficient documentation

## 2018-05-21 DIAGNOSIS — J111 Influenza due to unidentified influenza virus with other respiratory manifestations: Secondary | ICD-10-CM | POA: Diagnosis not present

## 2018-05-21 LAB — CBC WITH DIFFERENTIAL/PLATELET
Abs Immature Granulocytes: 0.07 10*3/uL (ref 0.00–0.07)
Basophils Absolute: 0.1 10*3/uL (ref 0.0–0.1)
Basophils Relative: 1 %
Eosinophils Absolute: 0.2 10*3/uL (ref 0.0–0.5)
Eosinophils Relative: 1 %
HCT: 39.1 % (ref 36.0–46.0)
Hemoglobin: 12.2 g/dL (ref 12.0–15.0)
Immature Granulocytes: 1 %
Lymphocytes Relative: 15 %
Lymphs Abs: 2 10*3/uL (ref 0.7–4.0)
MCH: 29.1 pg (ref 26.0–34.0)
MCHC: 31.2 g/dL (ref 30.0–36.0)
MCV: 93.3 fL (ref 80.0–100.0)
Monocytes Absolute: 1 10*3/uL (ref 0.1–1.0)
Monocytes Relative: 8 %
Neutro Abs: 10.4 10*3/uL — ABNORMAL HIGH (ref 1.7–7.7)
Neutrophils Relative %: 74 %
Platelets: 275 10*3/uL (ref 150–400)
RBC: 4.19 MIL/uL (ref 3.87–5.11)
RDW: 14.9 % (ref 11.5–15.5)
WBC: 13.8 10*3/uL — ABNORMAL HIGH (ref 4.0–10.5)
nRBC: 0 % (ref 0.0–0.2)

## 2018-05-21 LAB — BASIC METABOLIC PANEL
Anion gap: 9 (ref 5–15)
BUN: 18 mg/dL (ref 8–23)
CO2: 24 mmol/L (ref 22–32)
Calcium: 9 mg/dL (ref 8.9–10.3)
Chloride: 105 mmol/L (ref 98–111)
Creatinine, Ser: 1.4 mg/dL — ABNORMAL HIGH (ref 0.44–1.00)
GFR calc Af Amer: 42 mL/min — ABNORMAL LOW (ref >60)
GFR calc non Af Amer: 36 mL/min — ABNORMAL LOW (ref >60)
Glucose, Bld: 113 mg/dL — ABNORMAL HIGH (ref 70–99)
Potassium: 4.4 mmol/L (ref 3.5–5.1)
Sodium: 138 mmol/L (ref 135–145)

## 2018-05-21 LAB — BRAIN NATRIURETIC PEPTIDE: B Natriuretic Peptide: 76 pg/mL (ref 0.0–100.0)

## 2018-05-21 LAB — INFLUENZA PANEL BY PCR (TYPE A & B)
Influenza A By PCR: NEGATIVE
Influenza B By PCR: NEGATIVE

## 2018-05-21 MED ORDER — AZITHROMYCIN 250 MG PO TABS
500.0000 mg | ORAL_TABLET | Freq: Once | ORAL | Status: AC
  Administered 2018-05-21: 500 mg via ORAL
  Filled 2018-05-21: qty 2

## 2018-05-21 MED ORDER — SODIUM CHLORIDE 0.9 % IV SOLN
1.0000 g | Freq: Once | INTRAVENOUS | Status: AC
  Administered 2018-05-21: 1 g via INTRAVENOUS
  Filled 2018-05-21: qty 10

## 2018-05-21 MED ORDER — DOXYCYCLINE HYCLATE 100 MG PO CAPS: 100.0000 mg | ORAL_CAPSULE | Freq: Two times a day (BID) | ORAL | 0 refills | Status: DC

## 2018-05-21 MED ORDER — HYDROCOD POLST-CPM POLST ER 10-8 MG/5ML PO SUER
5.0000 mL | Freq: Once | ORAL | Status: AC
  Administered 2018-05-21: 5 mL via ORAL
  Filled 2018-05-21: qty 5

## 2018-05-21 MED ORDER — HYDROCOD POLST-CPM POLST ER 10-8 MG/5ML PO SUER: 5.0000 mL | Freq: Two times a day (BID) | ORAL | 0 refills | Status: DC | PRN

## 2018-05-21 NOTE — ED Provider Notes (Signed)
[facility] EMERGENCY DEPARTMENT Provider Note   CSN: [id number] Arrival date & time: 05/21/18  1300    History   Chief Complaint Chief Complaint  Patient presents with  . Cough    HPI Diane Torres is a 78 y.o. female.     Pt presents to the ED today with sob and cough.  She woke up this am with sx.  She had chills, but did not check her temp.  She took 2 tylenol around 0630.  She has not left her house in 1 week.  She has not had any known sick exposures.     Past Medical History:  Diagnosis Date  . Diverticulitis 2018  . GERD (gastroesophageal reflux disease)   . Hypothyroidism   . Impaired fasting glucose   . Mixed hyperlipidemia     Patient Active Problem List   Diagnosis Date Noted  . Acute diverticulitis 06/30/2017  . Rectal bleeding 06/30/2017  . Leukocytosis 06/30/2017  . CKD (chronic kidney disease) stage 3, GFR 30-59 ml/min (HCC) 06/30/2017  . LLQ pain 12/23/2016  . GERD (gastroesophageal reflux disease) 06/16/2016  . Gastroesophageal reflux disease with esophagitis 06/16/2016  . Precordial pain 03/10/2013  . Impaired fasting glucose 03/09/2013  . Mixed hyperlipidemia 03/09/2013  . Hypothyroidism 03/09/2013    Past Surgical History:  Procedure Laterality Date  . COLONOSCOPY    . COLONOSCOPY N/A 12/12/2015   Dr. Gala Romney: one 3 mm tubular adenoma in rectum s/p removal. Diverticulosis in entire colon. Internal hemorrhoids.   . ESOPHAGOGASTRODUODENOSCOPY N/A 03/19/2016   Dr. Gala Romney: LA Grade A esophagitis, empiric dilation, normal stomach, normal duodenum  . Left lobe thyroidectomy    . MALONEY DILATION N/A 03/19/2016   Procedure: Venia Minks DILATION;  Surgeon: Daneil Dolin, MD;  [facility];  Service: Endoscopy;  Laterality: N/A;  . POLYPECTOMY  12/12/2015   Procedure: POLYPECTOMY;  Surgeon: Daneil Dolin, MD;  Location: [facility];  Service: Endoscopy;;  colon  . RECTOCELE REPAIR    . TOTAL ABDOMINAL HYSTERECTOMY W/ BILATERAL  SALPINGOOPHORECTOMY       OB History    Gravida  2   Para  2   Term  2   Preterm      AB      Living        SAB      TAB      Ectopic      Multiple      Live Births               Home Medications    Prior to Admission medications   Medication Sig Start Date End Date Taking? Authorizing Provider  acetaminophen (TYLENOL) 325 MG tablet Take 650 mg by mouth every 6 (six) hours as needed.   Yes [provider]  loratadine (CLARITIN) 10 MG tablet Take 10 mg by mouth every morning.    Yes [provider]  losartan (COZAAR) 100 MG tablet Take 100 mg by mouth daily.   Yes [provider]  pantoprazole (PROTONIX) 40 MG tablet TAKE ONE TABLET BY MOUTH ONCE DAILY. 08/07/16  Yes Mahala Menghini, PA-C  simvastatin (ZOCOR) 40 MG tablet Take 40 mg by mouth at bedtime.    Yes [provider]  chlorpheniramine-HYDROcodone (TUSSIONEX PENNKINETIC ER) 10-8 MG/5ML SUER Take 5 mLs by mouth every 12 (twelve) hours as needed for cough. 05/21/18   Isla Pence, MD  doxycycline (VIBRAMYCIN) 100 MG capsule Take 1 capsule (100 mg total) by  mouth 2 (two) times daily. 05/21/18   Isla Pence, MD  fluconazole (DIFLUCAN) 150 MG tablet Take 1 tablet (150 mg total) by mouth once as needed for up to 1 dose (yeast infection). Repeat if needed Patient not taking: Reported on 05/21/2018 07/01/17   Murlean Iba, MD    Family History Family History  Problem Relation Age of Onset  . CAD Brother        2 brothers with CAD in their 40s  . CAD Father   . Heart failure Mother   . Diabetes Mellitus II Mother     Social History Social History   Tobacco Use  . Smoking status: Never Smoker  . Smokeless tobacco: Never Used  Substance Use Topics  . Alcohol use: No  . Drug use: No     Allergies   Bee venom   Review of Systems Review of Systems  Respiratory: Positive for cough and shortness of breath.   All other systems reviewed and are negative.     Physical Exam Updated Vital Signs BP (!) 145/58   Pulse 83   Temp 98.4 F (36.9 C) (Oral)   Resp 20   Ht 5\' 1"  (1.549 m)   Wt 76.2 kg   SpO2 92%   BMI 31.74 kg/m   Physical Exam Vitals signs and nursing note reviewed.  Constitutional:      Appearance: Normal appearance.  HENT:     Head: Normocephalic and atraumatic.     Right Ear: External ear normal.     Left Ear: External ear normal.     Nose: Nose normal.     Mouth/Throat:     Mouth: Mucous membranes are moist.  Eyes:     Extraocular Movements: Extraocular movements intact.     Conjunctiva/sclera: Conjunctivae normal.     Pupils: Pupils are equal, round, and reactive to light.  Neck:     Musculoskeletal: Normal range of motion.  Cardiovascular:     Rate and Rhythm: Normal rate and regular rhythm.     Pulses: Normal pulses.     Heart sounds: Normal heart sounds.  Pulmonary:     Effort: Pulmonary effort is normal.     Breath sounds: Normal breath sounds.  Abdominal:     General: Abdomen is flat.  Musculoskeletal: Normal range of motion.  Skin:    General: Skin is warm.     Capillary Refill: Capillary refill takes less than 2 seconds.  Neurological:     General: No focal deficit present.     Mental Status: She is alert and oriented to person, place, and time.  Psychiatric:        Mood and Affect: Mood normal.        Behavior: Behavior normal.      ED Treatments / Results  Labs (all labs ordered are listed, but only abnormal results are displayed) Labs Reviewed  BASIC METABOLIC PANEL - Abnormal; Notable for the following components:      Result Value   Glucose, Bld 113 (*)    Creatinine, Ser 1.40 (*)    GFR calc non Af Amer 36 (*)    GFR calc Af Amer 42 (*)    All other components within normal limits  CBC WITH DIFFERENTIAL/PLATELET - Abnormal; Notable for the following components:   WBC 13.8 (*)    Neutro Abs 10.4 (*)    All other components within normal limits  BRAIN NATRIURETIC PEPTIDE   INFLUENZA PANEL BY PCR (TYPE A & B)  EKG EKG Interpretation  Date/Time:  Friday May 21 2018 14:16:23 EDT Ventricular Rate:  91 PR Interval:    QRS Duration: 80 QT Interval:  376 QTC Calculation: 463 R Axis:   8 Text Interpretation:  Sinus rhythm Anterior infarct, old Confirmed by Isla Pence [phone]) on 05/21/2018 2:19:34 PM   Radiology Dg Chest Port 1 View  Result Date: 05/21/2018 CLINICAL DATA:  Awoke with shortness of breath last night. EXAM: PORTABLE CHEST 1 VIEW COMPARISON:  03/20/2009 FINDINGS: Heart size is normal. Mediastinal shadows are normal. There is patchy pneumonia at least at the right lung base and possibly also on the left. The upper lungs are clear. No edema or effusions. IMPRESSION: Right lower lobe pneumonia. Possible lesser changes at the left lung base as well. Electronically Signed   By: Nelson Chimes M.D.   On: 05/21/2018 14:09    Procedures Procedures (including critical care time)  Medications Ordered in ED Medications  cefTRIAXone (ROCEPHIN) 1 g in sodium chloride 0.9 % 100 mL IVPB (0 g Intravenous Stopped 05/21/18 1600)  azithromycin (ZITHROMAX) tablet 500 mg (500 mg Oral Given 05/21/18 1504)  chlorpheniramine-HYDROcodone (TUSSIONEX) 10-8 MG/5ML suspension 5 mL (5 mLs Oral Given 05/21/18 1507)     Initial Impression / Assessment and Plan / ED Course  I have reviewed the triage vital signs and the nursing notes.  Pertinent labs & imaging results that were available during my care of the patient were reviewed by me and considered in my medical decision making (see chart for details).       Pt does have evidence of RLL CAP on CXR.  She is given a dose of rocephin and zithromax in ED.  She is oxygenating well, and can go home with a rx for doxy.  She is instructed to return if worse and to f/u with pcp.  Diane Torres was evaluated in Emergency Department on 05/21/2018 for the symptoms described in the history of present illness. She was evaluated  in the context of the global COVID-19 pandemic, which necessitated consideration that the patient might be at risk for infection with the SARS-CoV-2 virus that causes COVID-19. Institutional protocols and algorithms that pertain to the evaluation of patients at risk for COVID-19 are in a state of rapid change based on information released by regulatory bodies including the CDC and federal and state organizations. These policies and algorithms were followed during the patient's care in the ED.  Final Clinical Impressions(s) / ED Diagnoses   Final diagnoses:  Community acquired pneumonia of right lower lobe of lung [facility])    ED Discharge Orders         Ordered    doxycycline (VIBRAMYCIN) 100 MG capsule  2 times daily     05/21/18 1457    chlorpheniramine-HYDROcodone (TUSSIONEX PENNKINETIC ER) 10-8 MG/5ML SUER  Every 12 hours PRN     05/21/18 1505           Isla Pence, MD 05/21/18 1919

## 2018-05-21 NOTE — ED Notes (Signed)
Patient given discharge instruction, verbalized understand. IV removed, band aid applied. Patient ambulatory out of the department.  

## 2018-05-21 NOTE — ED Triage Notes (Signed)
Patient states she awoke after midnight with cough, SOB, sore throat and chills. Afebrile in Triage.

## 2018-05-21 NOTE — ED Notes (Signed)
IV antibiotic complete, pt is ready for DC

## 2018-05-28 DIAGNOSIS — J189 Pneumonia, unspecified organism: Secondary | ICD-10-CM | POA: Diagnosis not present

## 2018-07-13 DIAGNOSIS — L304 Erythema intertrigo: Secondary | ICD-10-CM | POA: Diagnosis not present

## 2018-07-13 DIAGNOSIS — L820 Inflamed seborrheic keratosis: Secondary | ICD-10-CM | POA: Diagnosis not present

## 2018-10-18 DIAGNOSIS — Z23 Encounter for immunization: Secondary | ICD-10-CM | POA: Diagnosis not present

## 2018-10-18 DIAGNOSIS — K219 Gastro-esophageal reflux disease without esophagitis: Secondary | ICD-10-CM | POA: Diagnosis not present

## 2018-10-18 DIAGNOSIS — I10 Essential (primary) hypertension: Secondary | ICD-10-CM | POA: Diagnosis not present

## 2018-10-18 DIAGNOSIS — J302 Other seasonal allergic rhinitis: Secondary | ICD-10-CM | POA: Diagnosis not present

## 2018-10-18 DIAGNOSIS — E785 Hyperlipidemia, unspecified: Secondary | ICD-10-CM | POA: Diagnosis not present

## 2018-10-26 DIAGNOSIS — E785 Hyperlipidemia, unspecified: Secondary | ICD-10-CM | POA: Diagnosis not present

## 2018-10-26 DIAGNOSIS — K219 Gastro-esophageal reflux disease without esophagitis: Secondary | ICD-10-CM | POA: Diagnosis not present

## 2018-10-26 DIAGNOSIS — Z1329 Encounter for screening for other suspected endocrine disorder: Secondary | ICD-10-CM | POA: Diagnosis not present

## 2018-10-26 DIAGNOSIS — I10 Essential (primary) hypertension: Secondary | ICD-10-CM | POA: Diagnosis not present

## 2018-11-02 DIAGNOSIS — K5792 Diverticulitis of intestine, part unspecified, without perforation or abscess without bleeding: Secondary | ICD-10-CM | POA: Diagnosis not present

## 2018-11-02 DIAGNOSIS — R5383 Other fatigue: Secondary | ICD-10-CM | POA: Diagnosis not present

## 2018-11-02 DIAGNOSIS — E669 Obesity, unspecified: Secondary | ICD-10-CM | POA: Diagnosis not present

## 2018-11-02 DIAGNOSIS — I10 Essential (primary) hypertension: Secondary | ICD-10-CM | POA: Diagnosis not present

## 2018-11-02 DIAGNOSIS — E785 Hyperlipidemia, unspecified: Secondary | ICD-10-CM | POA: Diagnosis not present

## 2018-11-02 DIAGNOSIS — Z0001 Encounter for general adult medical examination with abnormal findings: Secondary | ICD-10-CM | POA: Diagnosis not present

## 2018-11-02 DIAGNOSIS — Z6832 Body mass index (BMI) 32.0-32.9, adult: Secondary | ICD-10-CM | POA: Diagnosis not present

## 2018-11-02 DIAGNOSIS — K219 Gastro-esophageal reflux disease without esophagitis: Secondary | ICD-10-CM | POA: Diagnosis not present

## 2018-11-02 DIAGNOSIS — N183 Chronic kidney disease, stage 3 (moderate): Secondary | ICD-10-CM | POA: Diagnosis not present

## 2018-11-02 DIAGNOSIS — J302 Other seasonal allergic rhinitis: Secondary | ICD-10-CM | POA: Diagnosis not present

## 2018-11-23 NOTE — Progress Notes (Signed)
Referring Provider: Asencion Noble, MD Primary Care Physician:  Celene Squibb, MD Primary GI Physician: Dr. Gala Romney  Chief Complaint  Patient presents with  . Diverticulitis    finished Cipro and Flagyl 10/5; soreness llq    HPI:   Diane Torres is a 78 y.o. female presenting today with a GI history of diverticulosis with 3 episodes of CT documented diverticulitis, GERD, esophagitis, dysphagia, undergoing EGD in 2018 withLA Grade A esophagitis, s/p dilation who presents today for diverticulitis.   She was last seen by our staff in May 2019 when she was admitted for uncomplicated sigmoid diverticulitis with rectal bleeding, on aspirin.  Prior episodes of diverticulitis in November 2018 involving distal descending colon and in 2015 involving proximal sigmoid and rectosigmoid colon.  Last colonoscopy in November 2017 with one tubular adenoma, diverticulosis in the entire colon, and internal hemorrhoids.  She was advised to return to our office in 2 months after discharge; however, patient canceled her appointment.  Today she states she recently had an episode of diverticulitis. Saw Dr. Nevada Crane who prescribed an antibiotic (cipro 500 and flagyl 500 BID) on 11/02/18 for 7 days. Last dose on 11/09/18. Stated it worked well for a few days but her LLQ pain has returned. When it first started, she had diarrhea. Reports stools were very dark. Not completely black. Pain started getting worse this past Saturday. When she gets up, feels like something is twisting and pulling. Pain is about 2/10 while sitting, when getting up and moving, pain increases to 5-6/10. Pain is worse than before starting antibiotics. BMs are daily and formed. No bright red blood or melena. No nausea or vomiting. GERD is well controlled on Protonix. No upper abdominal pain. Able to eat and drink normally. Only takes Tylenol.   No fever or chills. No lightheadedness, dizziness, or feeling like she will pass out.   Past Medical History:   Diagnosis Date  . CAP (community acquired pneumonia) 05/2018  . Diverticulitis    CT verified in 2015, 2018, and 2019  . GERD (gastroesophageal reflux disease)   . Hypothyroidism   . Impaired fasting glucose   . Mixed hyperlipidemia     Past Surgical History:  Procedure Laterality Date  . COLONOSCOPY    . COLONOSCOPY N/A 12/12/2015   Dr. Gala Romney: one 3 mm tubular adenoma in rectum s/p removal. Diverticulosis in entire colon. Internal hemorrhoids.   . ESOPHAGOGASTRODUODENOSCOPY N/A 03/19/2016   Dr. Gala Romney: LA Grade A esophagitis, empiric dilation, normal stomach, normal duodenum  . Left lobe thyroidectomy    . MALONEY DILATION N/A 03/19/2016   Procedure: Venia Minks DILATION;  Surgeon: Daneil Dolin, MD;  [facility];  Service: Endoscopy;  Laterality: N/A;  . POLYPECTOMY  12/12/2015   Procedure: POLYPECTOMY;  Surgeon: Daneil Dolin, MD;  Location[facility];  Service: Endoscopy;;  colon  . RECTOCELE REPAIR    . TOTAL ABDOMINAL HYSTERECTOMY W/ BILATERAL SALPINGOOPHORECTOMY      Current Outpatient Medications  Medication Sig Dispense Refill  . acetaminophen (TYLENOL) 325 MG tablet Take 650 mg by mouth every 6 (six) hours as needed.    . loratadine (CLARITIN) 10 MG tablet Take 10 mg by mouth every morning.     Marland Kitchen losartan (COZAAR) 100 MG tablet Take 100 mg by mouth daily.    . pantoprazole (PROTONIX) 40 MG tablet TAKE ONE TABLET BY MOUTH ONCE DAILY. 30 tablet 11  . simvastatin (ZOCOR) 40 MG tablet Take 40 mg by mouth  at bedtime.     . chlorpheniramine-HYDROcodone (TUSSIONEX PENNKINETIC ER) 10-8 MG/5ML SUER Take 5 mLs by mouth every 12 (twelve) hours as needed for cough. (Patient not taking: Reported on 11/24/2018) 140 mL 0   No current facility-administered medications for this visit.     Allergies as of 11/24/2018 - Review Complete 11/24/2018  Allergen Reaction Noted  . Bee venom Anaphylaxis 03/18/2016    Family History  Problem Relation Age of Onset  . CAD Brother         2 brothers with CAD in their 30s  . Crohn's disease Brother        had colon resection  . Pancreatic cancer Brother   . CAD Father   . Heart failure Mother   . Diabetes Mellitus II Mother   . Colon cancer Neg Hx     Social History   Socioeconomic History  . Marital status: Married    Spouse name: Not on file  . Number of children: Not on file  . Years of education: Not on file  . Highest education level: Not on file  Occupational History  . Not on file  Social Needs  . Financial resource strain: Not on file  . Food insecurity    Worry: Not on file    Inability: Not on file  . Transportation needs    Medical: Not on file    Non-medical: Not on file  Tobacco Use  . Smoking status: Never Smoker  . Smokeless tobacco: Never Used  Substance and Sexual Activity  . Alcohol use: No  . Drug use: No  . Sexual activity: Not on file  Lifestyle  . Physical activity    Days per week: Not on file    Minutes per session: Not on file  . Stress: Not on file  Relationships  . Social Herbalist on phone: Not on file    Gets together: Not on file    Attends religious service: Not on file    Active member of club or organization: Not on file    Attends meetings of clubs or organizations: Not on file    Relationship status: Not on file  Other Topics Concern  . Not on file  Social History Narrative  . Not on file    Review of Systems: Gen: See HPI  HEENT: No cold or flu like symptoms.  CV: Denies chest pain, palpitations Resp: Denies dyspnea  or cough GI: See HPI Derm: Denies rash Psych: Denies depression or anxiety.  Heme: Denies bruising, bleeding  Physical Exam: BP (!) 145/83   Pulse 97   Temp (!) 96.6 F (35.9 C) (Temporal)   Ht 5\' 1"  (1.549 m)   Wt 167 lb 3.2 oz (75.8 kg)   BMI 31.59 kg/m  General:   Alert and oriented. No distress noted. Pleasant and cooperative.  Head:  Normocephalic and atraumatic. Eyes:  Conjuctiva clear without scleral  icterus. Heart:  S1, S2 present without murmurs appreciated. Lungs:  Clear to auscultation bilaterally. No wheezes, rales, or rhonchi. No distress.  Abdomen:  +BS, soft, and non-distended. Significant tenderness to moderate palpation in the LLQ and mid lower abdomen. No rebound or guarding.  Msk:  Symmetrical without gross deformities. Normal posture. Extremities:  Without edema. Neurologic:  Alert and  oriented x4 Psych:  Normal mood and affect.

## 2018-11-24 ENCOUNTER — Other Ambulatory Visit: Payer: Self-pay

## 2018-11-24 ENCOUNTER — Ambulatory Visit (INDEPENDENT_AMBULATORY_CARE_PROVIDER_SITE_OTHER): Payer: Medicare Other | Admitting: Gastroenterology

## 2018-11-24 ENCOUNTER — Ambulatory Visit (HOSPITAL_COMMUNITY)
Admission: RE | Admit: 2018-11-24 | Discharge: 2018-11-24 | Disposition: A | Discharge status: Home / Self Care | Payer: Medicare Other | Source: Ambulatory Visit | Attending: Gastroenterology | Admitting: Gastroenterology

## 2018-11-24 ENCOUNTER — Other Ambulatory Visit (HOSPITAL_COMMUNITY)
Admission: RE | Admit: 2018-11-24 | Discharge: 2018-11-24 | Disposition: A | Discharge status: Home / Self Care | Payer: Medicare Other | Source: Ambulatory Visit | Attending: Gastroenterology | Admitting: Gastroenterology

## 2018-11-24 ENCOUNTER — Encounter: Payer: Self-pay | Admitting: Gastroenterology

## 2018-11-24 ENCOUNTER — Other Ambulatory Visit: Payer: Self-pay | Admitting: Gastroenterology

## 2018-11-24 VITALS — BP 145/83 | HR 97 | Temp 96.6°F | Ht 61.0 in | Wt 167.2 lb

## 2018-11-24 DIAGNOSIS — R1032 Left lower quadrant pain: Secondary | ICD-10-CM | POA: Insufficient documentation

## 2018-11-24 DIAGNOSIS — K5792 Diverticulitis of intestine, part unspecified, without perforation or abscess without bleeding: Secondary | ICD-10-CM

## 2018-11-24 DIAGNOSIS — K5732 Diverticulitis of large intestine without perforation or abscess without bleeding: Secondary | ICD-10-CM

## 2018-11-24 LAB — BASIC METABOLIC PANEL
Anion gap: 11 (ref 5–15)
BUN: 23 mg/dL (ref 8–23)
CO2: 24 mmol/L (ref 22–32)
Calcium: 9.4 mg/dL (ref 8.9–10.3)
Chloride: 105 mmol/L (ref 98–111)
Creatinine, Ser: 1.34 mg/dL — ABNORMAL HIGH (ref 0.44–1.00)
GFR calc Af Amer: 44 mL/min — ABNORMAL LOW (ref >60)
GFR calc non Af Amer: 38 mL/min — ABNORMAL LOW (ref >60)
Glucose, Bld: 117 mg/dL — ABNORMAL HIGH (ref 70–99)
Potassium: 4.5 mmol/L (ref 3.5–5.1)
Sodium: 140 mmol/L (ref 135–145)

## 2018-11-24 LAB — CBC WITH DIFFERENTIAL/PLATELET
Abs Immature Granulocytes: 0.02 10*3/uL (ref 0.00–0.07)
Basophils Absolute: 0.1 10*3/uL (ref 0.0–0.1)
Basophils Relative: 1 %
Eosinophils Absolute: 0.2 10*3/uL (ref 0.0–0.5)
Eosinophils Relative: 3 %
HCT: 40.4 % (ref 36.0–46.0)
Hemoglobin: 12.6 g/dL (ref 12.0–15.0)
Immature Granulocytes: 0 %
Lymphocytes Relative: 26 %
Lymphs Abs: 2 10*3/uL (ref 0.7–4.0)
MCH: 29.8 pg (ref 26.0–34.0)
MCHC: 31.2 g/dL (ref 30.0–36.0)
MCV: 95.5 fL (ref 80.0–100.0)
Monocytes Absolute: 0.6 10*3/uL (ref 0.1–1.0)
Monocytes Relative: 8 %
Neutro Abs: 4.7 10*3/uL (ref 1.7–7.7)
Neutrophils Relative %: 62 %
Platelets: 304 10*3/uL (ref 150–400)
RBC: 4.23 MIL/uL (ref 3.87–5.11)
RDW: 14.4 % (ref 11.5–15.5)
WBC: 7.6 10*3/uL (ref 4.0–10.5)
nRBC: 0 % (ref 0.0–0.2)

## 2018-11-24 MED ORDER — IOHEXOL 300 MG/ML  SOLN
75.0000 mL | Freq: Once | INTRAMUSCULAR | Status: AC | PRN
  Administered 2018-11-24: 75 mL via INTRAVENOUS

## 2018-11-24 MED ORDER — IOHEXOL 9 MG/ML PO SOLN
ORAL | Status: AC
  Filled 2018-11-24: qty 1000

## 2018-11-24 MED ORDER — AMOXICILLIN-POT CLAVULANATE 875-125 MG PO TABS: 1.0000 | ORAL_TABLET | Freq: Three times a day (TID) | ORAL | 0 refills | Status: DC

## 2018-11-24 NOTE — Progress Notes (Signed)
PT is aware referral has been made and Rx was sent in and the diet she is suppose to follow.

## 2018-11-24 NOTE — Progress Notes (Signed)
Evidence of diverticulitis in the descending and sigmoid colon without abscess or perforation. As patient just completed cipro and flagyl earlier this month, I would like to treat her with Augmentin. Doris, can you call and confirm that patient does not have an allergy to penicillin? If she doesn't, I will send in Augmentin for her to take 3 times daily for 10 days. I will also refer her to surgery for recurrent diverticulitis.   [facility] Clinical Pool: Patient needs referral to surgery for recurrent diverticulitis.

## 2018-11-24 NOTE — Assessment & Plan Note (Addendum)
78 y.o. female with a history of diverticulosis with 3 episodes of CT documented diverticulitis in 2015, 2018, and May 2019. She is presenting today with recurrent LLQ pain that started in September 2020. Her PCP prescribed Cipro 500 BID and Flagyl 500 BID x7 days on 11/02/18. Associated symptoms at that time included dark colored diarrhea. Symptoms improved initially; however, LLQ pain returned on 11/20/18. Pain is worse with movement. About 6/10. States it is worse now than prior to starting antibiotics in September. BMs are now soft and formed. No brbpr or melena. No nausea, vomiting, fever, or chills. Oral intake is adequate. Significant tenderness to moderate palpation in the LLQ and mid lower abdomen Last colonoscopy in November 2017 with one tubular adenoma, diverticulosis in the entire colon.   I suspect patient likely has recurrent diverticulitis. Will pursue CT abdomen and pelvis with contrast to verify diverticulitis and rule out complications. Update BMP for kidney function. She may need reduced contrast. CBC today as well. Will have further recommendations following CT. Will likely place surgical referral after CT results as this would now be her 4th confirmed episode of diverticulitis.

## 2018-11-24 NOTE — Progress Notes (Signed)
cc'ed to pcp °

## 2018-11-24 NOTE — Progress Notes (Signed)
CBC within normal limits. WBC count is not elevated. BMP with reduced kidney function but this is overall stable compared to 6 months ago. Electrolytes within normal limits. Waiting on CT results for possible diverticulitis.

## 2018-11-24 NOTE — Patient Instructions (Signed)
1. Please have labs completed.   2. I have placed order for STAT CT of your abdomen. We will call with results and further recommendations.   3. Please follow clear liquid diet for now.   4. You may end up needing surgical referral. We will determine this after CT results.   Aliene Altes, PA-C [facility]

## 2018-11-24 NOTE — Patient Instructions (Signed)
No PA needed for CT abd/pelvis w/contrast per [facility] website.

## 2018-11-26 ENCOUNTER — Telehealth: Payer: Self-pay | Admitting: Gastroenterology

## 2018-11-26 NOTE — Telephone Encounter (Signed)
Called to check on patient.  She is feeling somewhat improved but continues to have left lower quadrant pain bowels are not moving well.  No fevers or chills.  Only having small hard bowel movements.  Denies nausea or vomiting.  Eating a soft low fiber diet at this time.  Drinking plenty of water.  Urine is pale yellow to clear.  Advise she add MiraLAX daily to twice daily for her constipation.  Requested she call us back on Monday with an update.  Candace Cruise

## 2018-11-29 ENCOUNTER — Telehealth: Payer: Self-pay | Admitting: Internal Medicine

## 2018-11-29 ENCOUNTER — Other Ambulatory Visit: Payer: Self-pay | Admitting: Gastroenterology

## 2018-11-29 MED ORDER — FLUCONAZOLE 150 MG PO TABS: 150.0000 mg | ORAL_TABLET | Freq: Once | ORAL | 0 refills | Status: AC

## 2018-11-29 NOTE — Telephone Encounter (Signed)
Pt was calling to give a phone report to the nurse. [phone]

## 2018-11-29 NOTE — Telephone Encounter (Signed)
Pt returned call. She is still taking her antibiotic. Pt ate solid food after church yesterday and had a bowel movement. It was loose, dark and runny once yesterday and this morning. Pt isn't sure why her stool is so dark when she doesn't eat anything dark. Pt is having lower left abdominal pain and only notifies it when she is up moving around. Pt would like a medication for a yeast infection sent to her pharmacy. Pt wants to know if her CT scan results worsened from her previous CT scan? After pt gets well, she wants to know what foods she can't have? Pt also wants to know if she opts not to have her colon removed when she's referred to the [facility] doctor, will it cause her to have cancer if she doesn't remove it?Please advise.

## 2018-11-29 NOTE — Telephone Encounter (Signed)
See other phone note

## 2018-11-29 NOTE — Telephone Encounter (Signed)
Are her stool just dark or are they black? When I saw her in the office, stools were just dark. Is her pain improving? I can't really say whether her diverticulitis is worse now compared to her CT in 2019. It appears the diverticulitis keeps recurring in her sigmoid/desceding colon which is the same now. It is not complicated meaning there are no abscesses or other concerning features in addition to the diverticulitis. Regarding foods she shouldn't eat. The literature isn't entirely consistent with recommendations, but she can try avoiding nuts, seeds, popcorn, and foods that contain small seeds like tomatoes etc. Otherwise, after she has improvement and is back to her baseline, the focus will be on a high fiber diet. For now, she should be following low fiber while she is recovering. We will likely need to get her set up for a colonoscopy once she improves to ensure there is nothing that is causing recurrence of these flares.    I will send in Diflucan 150 mg x 1 for yeast infection. She should not take Zocor while taking diflucan.   Can we get patient scheduled for follow-up in 4-6 weeks?

## 2018-11-29 NOTE — Telephone Encounter (Signed)
Noted  

## 2018-11-30 NOTE — Telephone Encounter (Signed)
Routing to [facility] 

## 2018-11-30 NOTE — Telephone Encounter (Addendum)
Pt called in following up on her note from yesterday.  Pt says that her stools are dark brown.  Pt says that she had bad pain last night and this morning.  She said that it feels like a burning sensation and soreness in one spot in her left groin area.  She says she feels like there has been little improvement. Pt was advised of all [facility]'s recommendations as far as diet, medication, and scheduling future appt. Pt scheduled appt for 12/29/2018.  Pt voiced understanding.

## 2018-12-01 NOTE — Telephone Encounter (Signed)
Spoke with patient. She continues with very minimal improvement in abdominal pain. She has 2 days left of her antibiotics (now Augmentin). Essentially, she is failing outpatient therapy as this is her second course of antibiotics. She had recently been treated with cipro and flagyl a few weeks ago. No fever, chills, nausea, or vomiting. I do not think a third round of oral antibiotics would be helpful. She likely needs a few days of IV antibiotics. I have recommended patient proceed to the emergency department to be admitted for IV antibiotics. Patient voiced understanding and states she will go to the ED in the morning.

## 2018-12-02 ENCOUNTER — Encounter (HOSPITAL_COMMUNITY): Payer: Self-pay

## 2018-12-02 ENCOUNTER — Emergency Department (HOSPITAL_COMMUNITY): Payer: Medicare Other

## 2018-12-02 ENCOUNTER — Other Ambulatory Visit: Payer: Self-pay

## 2018-12-02 ENCOUNTER — Inpatient Hospital Stay (HOSPITAL_COMMUNITY)
Admission: EM | Admit: 2018-12-02 | Discharge: 2018-12-05 | DRG: 392 | Disposition: A | Discharge status: Home / Self Care | Payer: Medicare Other | Attending: Family Medicine | Admitting: Family Medicine

## 2018-12-02 DIAGNOSIS — K5792 Diverticulitis of intestine, part unspecified, without perforation or abscess without bleeding: Secondary | ICD-10-CM | POA: Diagnosis present

## 2018-12-02 DIAGNOSIS — N183 Chronic kidney disease, stage 3 unspecified: Secondary | ICD-10-CM | POA: Diagnosis present

## 2018-12-02 DIAGNOSIS — E890 Postprocedural hypothyroidism: Secondary | ICD-10-CM | POA: Diagnosis present

## 2018-12-02 DIAGNOSIS — K5732 Diverticulitis of large intestine without perforation or abscess without bleeding: Secondary | ICD-10-CM | POA: Diagnosis present

## 2018-12-02 DIAGNOSIS — E039 Hypothyroidism, unspecified: Secondary | ICD-10-CM | POA: Diagnosis not present

## 2018-12-02 DIAGNOSIS — K219 Gastro-esophageal reflux disease without esophagitis: Secondary | ICD-10-CM | POA: Diagnosis present

## 2018-12-02 DIAGNOSIS — R197 Diarrhea, unspecified: Secondary | ICD-10-CM | POA: Diagnosis present

## 2018-12-02 DIAGNOSIS — Z20828 Contact with and (suspected) exposure to other viral communicable diseases: Secondary | ICD-10-CM | POA: Diagnosis present

## 2018-12-02 DIAGNOSIS — K2100 Gastro-esophageal reflux disease with esophagitis, without bleeding: Secondary | ICD-10-CM | POA: Diagnosis present

## 2018-12-02 DIAGNOSIS — E782 Mixed hyperlipidemia: Secondary | ICD-10-CM | POA: Diagnosis present

## 2018-12-02 DIAGNOSIS — Z23 Encounter for immunization: Secondary | ICD-10-CM | POA: Diagnosis not present

## 2018-12-02 DIAGNOSIS — K5790 Diverticulosis of intestine, part unspecified, without perforation or abscess without bleeding: Secondary | ICD-10-CM | POA: Diagnosis not present

## 2018-12-02 DIAGNOSIS — D72829 Elevated white blood cell count, unspecified: Secondary | ICD-10-CM | POA: Diagnosis not present

## 2018-12-02 DIAGNOSIS — Z03818 Encounter for observation for suspected exposure to other biological agents ruled out: Secondary | ICD-10-CM | POA: Diagnosis not present

## 2018-12-02 DIAGNOSIS — N1832 Chronic kidney disease, stage 3b: Secondary | ICD-10-CM

## 2018-12-02 DIAGNOSIS — R1032 Left lower quadrant pain: Secondary | ICD-10-CM | POA: Diagnosis present

## 2018-12-02 DIAGNOSIS — K5730 Diverticulosis of large intestine without perforation or abscess without bleeding: Secondary | ICD-10-CM | POA: Diagnosis not present

## 2018-12-02 DIAGNOSIS — R7301 Impaired fasting glucose: Secondary | ICD-10-CM | POA: Diagnosis not present

## 2018-12-02 LAB — CBC WITH DIFFERENTIAL/PLATELET
Abs Immature Granulocytes: 0.02 10*3/uL (ref 0.00–0.07)
Basophils Absolute: 0.1 10*3/uL (ref 0.0–0.1)
Basophils Relative: 1 %
Eosinophils Absolute: 0.3 10*3/uL (ref 0.0–0.5)
Eosinophils Relative: 3 %
HCT: 42 % (ref 36.0–46.0)
Hemoglobin: 12.7 g/dL (ref 12.0–15.0)
Immature Granulocytes: 0 %
Lymphocytes Relative: 23 %
Lymphs Abs: 2.1 10*3/uL (ref 0.7–4.0)
MCH: 29.3 pg (ref 26.0–34.0)
MCHC: 30.2 g/dL (ref 30.0–36.0)
MCV: 97 fL (ref 80.0–100.0)
Monocytes Absolute: 0.7 10*3/uL (ref 0.1–1.0)
Monocytes Relative: 8 %
Neutro Abs: 6.1 10*3/uL (ref 1.7–7.7)
Neutrophils Relative %: 65 %
Platelets: 297 10*3/uL (ref 150–400)
RBC: 4.33 MIL/uL (ref 3.87–5.11)
RDW: 14.3 % (ref 11.5–15.5)
WBC: 9.3 10*3/uL (ref 4.0–10.5)
nRBC: 0 % (ref 0.0–0.2)

## 2018-12-02 LAB — URINALYSIS, ROUTINE W REFLEX MICROSCOPIC
Bilirubin Urine: NEGATIVE
Glucose, UA: NEGATIVE mg/dL
Hgb urine dipstick: NEGATIVE
Ketones, ur: NEGATIVE mg/dL
Nitrite: NEGATIVE
Protein, ur: NEGATIVE mg/dL
Specific Gravity, Urine: 1.027 (ref 1.005–1.030)
pH: 6 (ref 5.0–8.0)

## 2018-12-02 LAB — COMPREHENSIVE METABOLIC PANEL
ALT: 25 U/L (ref 0–44)
AST: 25 U/L (ref 15–41)
Albumin: 4 g/dL (ref 3.5–5.0)
Alkaline Phosphatase: 74 U/L (ref 38–126)
Anion gap: 11 (ref 5–15)
BUN: 18 mg/dL (ref 8–23)
CO2: 24 mmol/L (ref 22–32)
Calcium: 9.4 mg/dL (ref 8.9–10.3)
Chloride: 104 mmol/L (ref 98–111)
Creatinine, Ser: 1.38 mg/dL — ABNORMAL HIGH (ref 0.44–1.00)
GFR calc Af Amer: 42 mL/min — ABNORMAL LOW (ref >60)
GFR calc non Af Amer: 36 mL/min — ABNORMAL LOW (ref >60)
Glucose, Bld: 106 mg/dL — ABNORMAL HIGH (ref 70–99)
Potassium: 4.5 mmol/L (ref 3.5–5.1)
Sodium: 139 mmol/L (ref 135–145)
Total Bilirubin: 0.6 mg/dL (ref 0.3–1.2)
Total Protein: 8 g/dL (ref 6.5–8.1)

## 2018-12-02 MED ORDER — METRONIDAZOLE IN NACL 5-0.79 MG/ML-% IV SOLN
500.0000 mg | Freq: Three times a day (TID) | INTRAVENOUS | Status: DC
  Administered 2018-12-02 – 2018-12-05 (×8): 500 mg via INTRAVENOUS
  Filled 2018-12-02 (×8): qty 100

## 2018-12-02 MED ORDER — SIMVASTATIN 20 MG PO TABS
40.0000 mg | ORAL_TABLET | Freq: Every day | ORAL | Status: DC
  Administered 2018-12-02 – 2018-12-04 (×3): 40 mg via ORAL
  Filled 2018-12-02 (×3): qty 2

## 2018-12-02 MED ORDER — LOSARTAN POTASSIUM 50 MG PO TABS
100.0000 mg | ORAL_TABLET | Freq: Every day | ORAL | Status: DC
  Administered 2018-12-02 – 2018-12-05 (×4): 100 mg via ORAL
  Filled 2018-12-02 (×4): qty 2

## 2018-12-02 MED ORDER — SODIUM CHLORIDE 0.9 % IV SOLN
INTRAVENOUS | Status: DC
  Administered 2018-12-02 – 2018-12-05 (×2): via INTRAVENOUS

## 2018-12-02 MED ORDER — IOHEXOL 300 MG/ML  SOLN
75.0000 mL | Freq: Once | INTRAMUSCULAR | Status: AC | PRN
  Administered 2018-12-02: 13:00:00 75 mL via INTRAVENOUS

## 2018-12-02 MED ORDER — ONDANSETRON HCL 4 MG/2ML IJ SOLN: 4.0000 mg | Freq: Four times a day (QID) | INTRAMUSCULAR | Status: DC | PRN

## 2018-12-02 MED ORDER — ENOXAPARIN SODIUM 30 MG/0.3ML ~~LOC~~ SOLN
30.0000 mg | SUBCUTANEOUS | Status: DC
  Administered 2018-12-02: 21:00:00 30 mg via SUBCUTANEOUS
  Filled 2018-12-02: qty 0.3

## 2018-12-02 MED ORDER — PNEUMOCOCCAL VAC POLYVALENT 25 MCG/0.5ML IJ INJ
0.5000 mL | INJECTION | INTRAMUSCULAR | Status: DC
  Filled 2018-12-02: qty 0.5

## 2018-12-02 MED ORDER — CIPROFLOXACIN IN D5W 400 MG/200ML IV SOLN: 400.0000 mg | Freq: Two times a day (BID) | INTRAVENOUS | Status: DC

## 2018-12-02 MED ORDER — FENTANYL CITRATE (PF) 100 MCG/2ML IJ SOLN: 25.0000 ug | INTRAMUSCULAR | Status: DC | PRN

## 2018-12-02 MED ORDER — DIPHENOXYLATE-ATROPINE 2.5-0.025 MG PO TABS: 2.0000 | ORAL_TABLET | Freq: Four times a day (QID) | ORAL | Status: AC | PRN

## 2018-12-02 MED ORDER — SODIUM CHLORIDE 0.9 % IV SOLN
2.0000 g | Freq: Once | INTRAVENOUS | Status: AC
  Administered 2018-12-02: 14:00:00 2 g via INTRAVENOUS
  Filled 2018-12-02: qty 20

## 2018-12-02 MED ORDER — METRONIDAZOLE IN NACL 5-0.79 MG/ML-% IV SOLN
500.0000 mg | Freq: Once | INTRAVENOUS | Status: AC
  Administered 2018-12-02: 15:00:00 500 mg via INTRAVENOUS
  Filled 2018-12-02: qty 100

## 2018-12-02 MED ORDER — ONDANSETRON HCL 4 MG PO TABS: 4.0000 mg | ORAL_TABLET | Freq: Four times a day (QID) | ORAL | Status: DC | PRN

## 2018-12-02 MED ORDER — LORATADINE 10 MG PO TABS
10.0000 mg | ORAL_TABLET | Freq: Every morning | ORAL | Status: DC
  Administered 2018-12-02 – 2018-12-05 (×4): 10 mg via ORAL
  Filled 2018-12-02 (×4): qty 1

## 2018-12-02 MED ORDER — OXYCODONE HCL 5 MG PO TABS
5.0000 mg | ORAL_TABLET | Freq: Four times a day (QID) | ORAL | Status: DC | PRN
  Filled 2018-12-02: qty 1

## 2018-12-02 MED ORDER — ACETAMINOPHEN 325 MG PO TABS
650.0000 mg | ORAL_TABLET | Freq: Four times a day (QID) | ORAL | Status: DC | PRN
  Administered 2018-12-03 (×2): 650 mg via ORAL
  Filled 2018-12-02 (×2): qty 2

## 2018-12-02 MED ORDER — PANTOPRAZOLE SODIUM 40 MG PO TBEC
40.0000 mg | DELAYED_RELEASE_TABLET | Freq: Every day | ORAL | Status: DC
  Administered 2018-12-02 – 2018-12-05 (×4): 40 mg via ORAL
  Filled 2018-12-02 (×4): qty 1

## 2018-12-02 MED ORDER — SODIUM CHLORIDE 0.9 % IV SOLN
2.0000 g | INTRAVENOUS | Status: DC
  Administered 2018-12-03 – 2018-12-05 (×3): 2 g via INTRAVENOUS
  Filled 2018-12-02 (×4): qty 20

## 2018-12-02 NOTE — ED Triage Notes (Signed)
PT reports was diagnosed with diverticulitis and has been on antibiotics with little improvement.  Reports was told to come to ED for admission and several days of IV antibiotics.  Pt c/o llq pain and diarrhea.   Denies fever or vomiting.

## 2018-12-02 NOTE — Progress Notes (Addendum)
Pt is currently being treated for diverticulitis. She has a hx of c.diff already. Ceftriaxone is not benign in this area but it may have a lower risk than cipro. D/w Dr. Irwin Brakeman and we will use ceftriaxone/metronidazole instead.   Onnie Boer, PharmD, BCIDP, AAHIVP, CPP Infectious Disease Pharmacist 12/02/2018 7:02 PM

## 2018-12-02 NOTE — H&P (Signed)
History and Physical  Diane Torres:[id number] DOB: 03-13-1940 DOA: 12/02/2018  Referring physician: Effie Shy  PCP: Benita Stabile, MD   Chief Complaint: LLQ abdominal pain   HPI: Diane Torres is a 78 y.o. female with a long history of colonic diverticulosis who has had multiple recent bouts of diverticulitis in the last year.  She was recently diagnosed with diverticulitis approximately 1 week ago and was treated with a course of oral antibiotics including ciprofloxacin and metronidazole which was completed and she subsequently continued to have symptoms of left lower quadrant abdominal pain and was seen by GI.  They prescribed a course of oral Augmentin and after several days the patient reported no improvement at that point it was felt that the patient had failed outpatient management and she was sent to the emergency department so that she can be admitted for IV antibiotics.  The patient had an initial CT done approximately 8 days ago that showed sigmoid diverticulitis.  Repeat CT scan was done today with findings of persistent diverticulitis but no abscess formation.  Patient has significant diverticulosis.  The patient was referred to general surgery by her GI service because of recurrent bouts of diverticulitis.  The patient was afebrile with a normal white blood cell count.  She mainly complains of nausea vomiting and diarrhea.  She has persistent left lower quadrant abdominal pain and is not eating and drinking well.  She has CKD stage III which is stable.  Her creatinine is 1.38.  She has a normal hemoglobin of 12.7.  Review of Systems: All systems reviewed and apart from history of presenting illness, are negative.  Past Medical History:  Diagnosis Date   CAP (community acquired pneumonia) 05/2018   Diverticulitis    CT verified in 2015, 2018, and 2019   GERD (gastroesophageal reflux disease)    Hypothyroidism    Impaired fasting glucose    Mixed hyperlipidemia    Past  Surgical History:  Procedure Laterality Date   COLONOSCOPY     COLONOSCOPY N/A 12/12/2015   Dr. Jena Gauss: one 3 mm tubular adenoma in rectum s/p removal. Diverticulosis in entire colon. Internal hemorrhoids.    ESOPHAGOGASTRODUODENOSCOPY N/A 03/19/2016   Dr. Jena Gauss: LA Grade A esophagitis, empiric dilation, normal stomach, normal duodenum   Left lobe thyroidectomy     MALONEY DILATION N/A 03/19/2016   Procedure: [name] DILATION;  Surgeon: Corbin Ade, MD;  [facility];  Service: Endoscopy;  Laterality: N/A;   POLYPECTOMY  12/12/2015   Procedure: POLYPECTOMY;  Surgeon: Corbin Ade, MD;  Location: [facility];  Service: Endoscopy;;  colon   RECTOCELE REPAIR     TOTAL ABDOMINAL HYSTERECTOMY W/ BILATERAL SALPINGOOPHORECTOMY     Social History:  reports that she has never smoked. She has never used smokeless tobacco. She reports that she does not drink alcohol or use drugs.  Allergies  Allergen Reactions   Bee Venom Anaphylaxis    Family History  Problem Relation Age of Onset   CAD Brother        2 brothers with CAD in their 74s   Crohn's disease Brother        had colon resection   Pancreatic cancer Brother    CAD Father    Heart failure Mother    Diabetes Mellitus II Mother    Colon cancer Neg Hx     Prior to Admission medications   Medication Sig Start Date End Date Taking? Authorizing Provider  acetaminophen (TYLENOL) 325  MG tablet Take 650 mg by mouth every 6 (six) hours as needed.   Yes [provider]  amoxicillin-clavulanate (AUGMENTIN) 875-125 MG tablet Take 1 tablet by mouth 3 (three) times daily for 10 days. 11/24/18 12/04/18 Yes Letta Median, PA-C  loratadine (CLARITIN) 10 MG tablet Take 10 mg by mouth every morning.    Yes [provider]  losartan (COZAAR) 100 MG tablet Take 100 mg by mouth daily.   Yes [provider]  pantoprazole (PROTONIX) 40 MG tablet TAKE ONE TABLET BY MOUTH ONCE DAILY. 08/07/16  Yes  Tiffany Kocher, PA-C  simvastatin (ZOCOR) 40 MG tablet Take 40 mg by mouth at bedtime.    Yes [provider]   Physical Exam: Vitals:   12/02/18 0854 12/02/18 0855  BP: (!) 143/69   Pulse: 86   Resp: 18   Temp: 98.1 F (36.7 C)   TempSrc: Oral   SpO2: 97%   Weight:  75.8 kg  Height:  5\' 1"  (1.549 m)     General exam: Moderately built and nourished patient, lying comfortably supine on the gurney in no obvious distress.  Head, eyes and ENT: Nontraumatic and normocephalic. Pupils equally reacting to light and accommodation. Oral mucosa dry.  Neck: Supple. No JVD, carotid bruit or thyromegaly.  Lymphatics: No lymphadenopathy.  Respiratory system: Clear to auscultation. No increased work of breathing.  Cardiovascular system: S1 and S2 heard, RRR. No JVD, murmurs, gallops, clicks or pedal edema.  Gastrointestinal system: Abdomen is nondistended, soft and diffuse left lower quadrant tenderness. Normal bowel sounds heard. No organomegaly or masses appreciated.  Central nervous system: Alert and oriented. No focal neurological deficits.  Extremities: Symmetric 5 x 5 power. Peripheral pulses symmetrically felt.   Skin: No rashes or acute findings.  Musculoskeletal system: Negative exam.  Psychiatry: Pleasant and cooperative.  Labs on Admission:  Basic Metabolic Panel: Recent Labs  Lab 12/02/18 0859  NA 139  K 4.5  CL 104  CO2 24  GLUCOSE 106*  BUN 18  CREATININE 1.38*  CALCIUM 9.4   Liver Function Tests: Recent Labs  Lab 12/02/18 0859  AST 25  ALT 25  ALKPHOS 74  BILITOT 0.6  PROT 8.0  ALBUMIN 4.0   No results for input(s): LIPASE, AMYLASE in the last 168 hours. No results for input(s): AMMONIA in the last 168 hours. CBC: Recent Labs  Lab 12/02/18 0859  WBC 9.3  NEUTROABS 6.1  HGB 12.7  HCT 42.0  MCV 97.0  PLT 297   Cardiac Enzymes: No results for input(s): CKTOTAL, CKMB, CKMBINDEX, TROPONINI in the last 168 hours.  BNP (last 3  results) No results for input(s): PROBNP in the last 8760 hours. CBG: No results for input(s): GLUCAP in the last 168 hours.  Radiological Exams on Admission: Ct Abdomen Pelvis W Contrast  Result Date: 12/02/2018 CLINICAL DATA:  Acute left lower quadrant abdominal pain. History of diverticulitis. Recently diagnosed diverticulitis, currently taking antibiotics. EXAM: CT ABDOMEN AND PELVIS WITH CONTRAST TECHNIQUE: Multidetector CT imaging of the abdomen and pelvis was performed using the standard protocol following bolus administration of intravenous contrast. CONTRAST:  75mL OMNIPAQUE IOHEXOL 300 MG/ML  SOLN COMPARISON:  11/24/2018 FINDINGS: Lower chest: Unremarkable. Hepatobiliary: Diffuse low density of the liver relative to the spleen. Small calcified liver granulomata. Minimal more focal fat deposition in the liver adjacent to the falciform ligament. Normal appearing gallbladder. Pancreas: Unremarkable. No pancreatic ductal dilatation or surrounding inflammatory changes. Spleen: Normal in size without focal abnormality. Adrenals/Urinary Tract:  Adrenal glands are unremarkable. Kidneys are normal, without renal calculi, focal lesion, or hydronephrosis. Bladder is unremarkable. Stomach/Bowel: Multiple colonic diverticula, most numerous in the sigmoid region. Mildly progressive soft tissue stranding and edema at the level of the proximal sigmoid colon. No extraluminal fluid or gas collections are seen. Unremarkable stomach, small bowel and appendix. Vascular/Lymphatic: Atheromatous arterial calcifications without aneurysm. No enlarged lymph nodes. Reproductive: Status post hysterectomy. No adnexal masses. Other: Small umbilical hernia containing fat. Musculoskeletal: Lumbar and lower thoracic spine degenerative changes. IMPRESSION: 1. Mildly progressive changes of diverticulitis at the level of the proximal sigmoid colon without abscess. 2. Extensive colonic diverticulosis. 3. Diffuse hepatic steatosis.  Electronically Signed   By: Beckie Salts M.D.   On: 12/02/2018 13:19    Assessment/Plan Principal Problem:   Acute diverticulitis Active Problems:   Impaired fasting glucose   Mixed hyperlipidemia   Hypothyroidism   GERD (gastroesophageal reflux disease)   Gastroesophageal reflux disease with esophagitis   LLQ pain   CKD (chronic kidney disease) stage 3, GFR 30-59 ml/min   Diarrhea   Diverticulosis   1. Recurrent sigmoid diverticulitis-patient has had multiple bouts of this in the last year and was referred by GI to general surgery.  She has failed outpatient management of this current episode after completing 2 courses of oral antibiotics including a course of Cipro Flagyl and oral Augmentin.  Given that she does have some diarrhea we will do a GI path panel and check C. difficile.  Continue IV antibiotics started in the ED with IV cipro/flagyl.  Follow clinical course.  Continue supportive care with IV fluids IV pain and nausea medications.  Follow electrolytes closely. 2. Hypothyroidism-resume home medications. 3. GERD-continue Protonix for GI protection. 4. Stage III CKD-creatinine currently stable continue to follow closely. 5. Sigmoid diverticulosis-it is pretty extensive and she has been referred to outpatient surgery for consideration of operative management. 6. Diarrhea-with 2 recent courses of oral antibiotics we are checking for C. difficile and GI path panel is pending.  DVT Prophylaxis: Lovenox Code Status: Full Family Communication: Updated at bedside, verbalized understanding Disposition Plan: Inpatient MedSurg  Time spent: 58 minutes  Sylvanus Telford Laural Benes, MD [facility] How to contact the [facility] Attending or Consulting provider 7A - 7P or covering provider during after hours 7P -7A, for this patient?  1. Check the care team in [facility] and look for a) attending/consulting [facility] provider listed and b) the [facility] team listed 2. Log into [email or web address] and use [facility]  universal password to access. If you do not have the password, please contact the hospital operator. 3. Locate the [facility] provider you are looking for under [facility] and page to a number that you can be directly reached. 4. If you still have difficulty reaching the provider, please page the [facility] (Director on Call) for the Hospitalists listed on amion for assistance.

## 2018-12-02 NOTE — ED Provider Notes (Signed)
[facility] Provider Note   CSN: [id number] Arrival date & time: 12/02/18  0840     History   Chief Complaint No chief complaint on file.   HPI Diane Torres is a 78 y.o. female.     78 year old female presents with complaint of burning left lower quadrant pain which is constant x1 month.  Patient was seen by her PCP for this, given antibiotics (Cipro and Flagyl) without improvement.  Patient followed up with her gastroenterologist and had a CT last week and was treated with additional antibiotics (Augmentin).  Patient feels as if she has had no improvement and was sent to the emergency room for evaluation.  Patient denies nausea, vomiting, fevers, chills, changes in bladder habits.  Patient states that she has had diarrhea, nonbloody, last bowel movement was yesterday.  Patient reports history of diverticulitis, feels similar.  Prior abdominal surgeries include hysterectomy and C-section.  No other complaints or concerns.     Past Medical History:  Diagnosis Date  . CAP (community acquired pneumonia) 05/2018  . Diverticulitis    CT verified in 2015, 2018, and 2019  . GERD (gastroesophageal reflux disease)   . Hypothyroidism   . Impaired fasting glucose   . Mixed hyperlipidemia     Patient Active Problem List   Diagnosis Date Noted  . Diarrhea 12/02/2018  . Diverticulosis 12/02/2018  . Acute diverticulitis 06/30/2017  . Rectal bleeding 06/30/2017  . CKD (chronic kidney disease) stage 3, GFR 30-59 ml/min 06/30/2017  . LLQ pain 12/23/2016  . GERD (gastroesophageal reflux disease) 06/16/2016  . Gastroesophageal reflux disease with esophagitis 06/16/2016  . Precordial pain 03/10/2013  . Impaired fasting glucose 03/09/2013  . Mixed hyperlipidemia 03/09/2013  . Hypothyroidism 03/09/2013    Past Surgical History:  Procedure Laterality Date  . COLONOSCOPY    . COLONOSCOPY N/A 12/12/2015   Dr. Gala Romney: one 3 mm tubular adenoma in rectum s/p removal.  Diverticulosis in entire colon. Internal hemorrhoids.   . ESOPHAGOGASTRODUODENOSCOPY N/A 03/19/2016   Dr. Gala Romney: LA Grade A esophagitis, empiric dilation, normal stomach, normal duodenum  . Left lobe thyroidectomy    . MALONEY DILATION N/A 03/19/2016   Procedure: Venia Minks DILATION;  Surgeon: Daneil Dolin, MD;  [facility];  Service: Endoscopy;  Laterality: N/A;  . POLYPECTOMY  12/12/2015   Procedure: POLYPECTOMY;  Surgeon: Daneil Dolin, MD;  Location: [facility];  Service: Endoscopy;;  colon  . RECTOCELE REPAIR    . TOTAL ABDOMINAL HYSTERECTOMY W/ BILATERAL SALPINGOOPHORECTOMY       OB History    Gravida  2   Para  2   Term  2   Preterm      AB      Living        SAB      TAB      Ectopic      Multiple      Live Births               Home Medications    Prior to Admission medications   Medication Sig Start Date End Date Taking? Authorizing Provider  acetaminophen (TYLENOL) 325 MG tablet Take 650 mg by mouth every 6 (six) hours as needed.   Yes [provider]  amoxicillin-clavulanate (AUGMENTIN) 875-125 MG tablet Take 1 tablet by mouth 3 (three) times daily for 10 days. 11/24/18 12/04/18 Yes Erenest Rasher, PA-C  loratadine (CLARITIN) 10 MG tablet Take 10 mg by mouth every morning.  Yes [provider]  losartan (COZAAR) 100 MG tablet Take 100 mg by mouth daily.   Yes [provider]  pantoprazole (PROTONIX) 40 MG tablet TAKE ONE TABLET BY MOUTH ONCE DAILY. 08/07/16  Yes Mahala Menghini, PA-C  simvastatin (ZOCOR) 40 MG tablet Take 40 mg by mouth at bedtime.    Yes [provider]    Family History Family History  Problem Relation Age of Onset  . CAD Brother        2 brothers with CAD in their 56s  . Crohn's disease Brother        had colon resection  . Pancreatic cancer Brother   . CAD Father   . Heart failure Mother   . Diabetes Mellitus II Mother   . Colon cancer Neg Hx     Social History  Social History   Tobacco Use  . Smoking status: Never Smoker  . Smokeless tobacco: Never Used  Substance Use Topics  . Alcohol use: No  . Drug use: No     Allergies   Bee venom   Review of Systems Review of Systems  Constitutional: Negative for chills, diaphoresis and fever.  Respiratory: Negative for shortness of breath.   Cardiovascular: Negative for chest pain.  Gastrointestinal: Positive for abdominal pain and diarrhea. Negative for blood in stool, constipation, nausea and vomiting.  Genitourinary: Negative for dysuria, frequency and urgency.  Musculoskeletal: Negative for back pain.  Skin: Negative for rash and wound.  Allergic/Immunologic: Negative for immunocompromised state.  Neurological: Negative for weakness.  Hematological: Does not bruise/bleed easily.  Psychiatric/Behavioral: Negative for confusion.  All other systems reviewed and are negative.    Physical Exam Updated Vital Signs BP (!) 155/66 (BP Location: Left Arm)   Pulse 77   Temp 98 F (36.7 C)   Resp 18   Ht 5\' 1"  (1.549 m)   Wt 75.8 kg   SpO2 97%   BMI 31.55 kg/m   Physical Exam Vitals signs and nursing note reviewed.  Constitutional:      General: She is not in acute distress.    Appearance: She is well-developed. She is not diaphoretic.  HENT:     Head: Normocephalic and atraumatic.  Cardiovascular:     Rate and Rhythm: Normal rate and regular rhythm.     Pulses: Normal pulses.     Heart sounds: Normal heart sounds.  Pulmonary:     Effort: Pulmonary effort is normal.     Breath sounds: Normal breath sounds.  Abdominal:     Palpations: Abdomen is soft.     Tenderness: There is abdominal tenderness in the left lower quadrant. There is guarding. There is no right CVA tenderness or left CVA tenderness.  Musculoskeletal:     Right lower leg: No edema.     Left lower leg: No edema.  Skin:    General: Skin is warm and dry.     Findings: No erythema or rash.  Neurological:      Mental Status: She is alert and oriented to person, place, and time.  Psychiatric:        Behavior: Behavior normal.      ED Treatments / Results  Labs (all labs ordered are listed, but only abnormal results are displayed) Labs Reviewed  COMPREHENSIVE METABOLIC PANEL - Abnormal; Notable for the following components:      Result Value   Glucose, Bld 106 (*)    Creatinine, Ser 1.38 (*)    GFR calc non Af  Amer 36 (*)    GFR calc Af Amer 42 (*)    All other components within normal limits  SARS CORONAVIRUS 2 (TAT 6-24 HRS)  GASTROINTESTINAL PANEL BY PCR, STOOL (REPLACES STOOL CULTURE)  C DIFFICILE QUICK SCREEN W PCR REFLEX  CBC WITH DIFFERENTIAL/PLATELET  URINALYSIS, ROUTINE W REFLEX MICROSCOPIC    EKG None  Radiology Ct Abdomen Pelvis W Contrast  Result Date: 12/02/2018 CLINICAL DATA:  Acute left lower quadrant abdominal pain. History of diverticulitis. Recently diagnosed diverticulitis, currently taking antibiotics. EXAM: CT ABDOMEN AND PELVIS WITH CONTRAST TECHNIQUE: Multidetector CT imaging of the abdomen and pelvis was performed using the standard protocol following bolus administration of intravenous contrast. CONTRAST:  47mL OMNIPAQUE IOHEXOL 300 MG/ML  SOLN COMPARISON:  11/24/2018 FINDINGS: Lower chest: Unremarkable. Hepatobiliary: Diffuse low density of the liver relative to the spleen. Small calcified liver granulomata. Minimal more focal fat deposition in the liver adjacent to the falciform ligament. Normal appearing gallbladder. Pancreas: Unremarkable. No pancreatic ductal dilatation or surrounding inflammatory changes. Spleen: Normal in size without focal abnormality. Adrenals/Urinary Tract: Adrenal glands are unremarkable. Kidneys are normal, without renal calculi, focal lesion, or hydronephrosis. Bladder is unremarkable. Stomach/Bowel: Multiple colonic diverticula, most numerous in the sigmoid region. Mildly progressive soft tissue stranding and edema at the level of the  proximal sigmoid colon. No extraluminal fluid or gas collections are seen. Unremarkable stomach, small bowel and appendix. Vascular/Lymphatic: Atheromatous arterial calcifications without aneurysm. No enlarged lymph nodes. Reproductive: Status post hysterectomy. No adnexal masses. Other: Small umbilical hernia containing fat. Musculoskeletal: Lumbar and lower thoracic spine degenerative changes. IMPRESSION: 1. Mildly progressive changes of diverticulitis at the level of the proximal sigmoid colon without abscess. 2. Extensive colonic diverticulosis. 3. Diffuse hepatic steatosis. Electronically Signed   By: Claudie Revering M.D.   On: 12/02/2018 13:19    Procedures Procedures (including critical care time)  Medications Ordered in ED Medications  cefTRIAXone (ROCEPHIN) 2 g in sodium chloride 0.9 % 100 mL IVPB (2 g Intravenous New Bag/Given 12/02/18 1358)    And  metroNIDAZOLE (FLAGYL) IVPB 500 mg (has no administration in time range)  iohexol (OMNIPAQUE) 300 MG/ML solution 75 mL (75 mLs Intravenous Contrast Given 12/02/18 1313)     Initial Impression / Assessment and Plan / ED Course  I have reviewed the triage vital signs and the nursing notes.  Pertinent labs & imaging results that were available during my care of the patient were reviewed by me and considered in my medical decision making (see chart for details).  Clinical Course as of Dec 01 1421  Thu Dec 02, 6750  8216 78 year old female presents with complaint of left lower quadrant pain x1 month, not improving with Cipro and Flagyl, not improving with subsequent course of Augmentin.  Patient had CT 8 days ago showing uncomplicated diverticulitis.  Patient was sent by Dr. Roseanne Kaufman office for admission for IV antibiotics. On exam patient has moderate tenderness with guarding in the left lower quadrant.  She is otherwise well-appearing, afebrile.  CBC with normal white count, CMP with creatinine unchanged from baseline.  Case discussed with Dr.  Eulis Foster, ER attending, agrees with plan to consult hospitalist.  IV Cipro and Flagyl ordered.   [LM]  1206 Case discussed with hospitalist who requested repeat CT scan.   [LM]  1421 Hospitalist to admit.    [LM]    Clinical Course User Index [LM] Tacy Learn, PA-C      Final Clinical Impressions(s) / ED Diagnoses   Final diagnoses:  Diverticulitis    ED Discharge Orders    None       Roque Lias 12/02/18 1423    Daleen Bo, MD 12/02/18 1747

## 2018-12-03 DIAGNOSIS — D72829 Elevated white blood cell count, unspecified: Secondary | ICD-10-CM

## 2018-12-03 LAB — COMPREHENSIVE METABOLIC PANEL
ALT: 17 U/L (ref 0–44)
AST: 17 U/L (ref 15–41)
Albumin: 3.2 g/dL — ABNORMAL LOW (ref 3.5–5.0)
Alkaline Phosphatase: 59 U/L (ref 38–126)
Anion gap: 9 (ref 5–15)
BUN: 16 mg/dL (ref 8–23)
CO2: 21 mmol/L — ABNORMAL LOW (ref 22–32)
Calcium: 8.3 mg/dL — ABNORMAL LOW (ref 8.9–10.3)
Chloride: 107 mmol/L (ref 98–111)
Creatinine, Ser: 1.14 mg/dL — ABNORMAL HIGH (ref 0.44–1.00)
GFR calc Af Amer: 53 mL/min — ABNORMAL LOW (ref >60)
GFR calc non Af Amer: 46 mL/min — ABNORMAL LOW (ref >60)
Glucose, Bld: 88 mg/dL (ref 70–99)
Potassium: 3.9 mmol/L (ref 3.5–5.1)
Sodium: 137 mmol/L (ref 135–145)
Total Bilirubin: 0.9 mg/dL (ref 0.3–1.2)
Total Protein: 6.4 g/dL — ABNORMAL LOW (ref 6.5–8.1)

## 2018-12-03 LAB — CBC WITH DIFFERENTIAL/PLATELET
Abs Immature Granulocytes: 0.03 10*3/uL (ref 0.00–0.07)
Basophils Absolute: 0 10*3/uL (ref 0.0–0.1)
Basophils Relative: 1 %
Eosinophils Absolute: 0.2 10*3/uL (ref 0.0–0.5)
Eosinophils Relative: 3 %
HCT: 34.8 % — ABNORMAL LOW (ref 36.0–46.0)
Hemoglobin: 11 g/dL — ABNORMAL LOW (ref 12.0–15.0)
Immature Granulocytes: 0 %
Lymphocytes Relative: 31 %
Lymphs Abs: 2.5 10*3/uL (ref 0.7–4.0)
MCH: 30.1 pg (ref 26.0–34.0)
MCHC: 31.6 g/dL (ref 30.0–36.0)
MCV: 95.3 fL (ref 80.0–100.0)
Monocytes Absolute: 0.6 10*3/uL (ref 0.1–1.0)
Monocytes Relative: 8 %
Neutro Abs: 4.7 10*3/uL (ref 1.7–7.7)
Neutrophils Relative %: 57 %
Platelets: 237 10*3/uL (ref 150–400)
RBC: 3.65 MIL/uL — ABNORMAL LOW (ref 3.87–5.11)
RDW: 14.4 % (ref 11.5–15.5)
WBC: 8.1 10*3/uL (ref 4.0–10.5)
nRBC: 0 % (ref 0.0–0.2)

## 2018-12-03 LAB — MAGNESIUM: Magnesium: 2.2 mg/dL (ref 1.7–2.4)

## 2018-12-03 LAB — C DIFFICILE QUICK SCREEN W PCR REFLEX
C Diff antigen: NEGATIVE
C Diff interpretation: NOT DETECTED
C Diff toxin: NEGATIVE

## 2018-12-03 LAB — SARS CORONAVIRUS 2 (TAT 6-24 HRS): SARS Coronavirus 2: NEGATIVE

## 2018-12-03 MED ORDER — ENOXAPARIN SODIUM 40 MG/0.4ML ~~LOC~~ SOLN
40.0000 mg | SUBCUTANEOUS | Status: DC
  Administered 2018-12-03 – 2018-12-04 (×2): 40 mg via SUBCUTANEOUS
  Filled 2018-12-03 (×2): qty 0.4

## 2018-12-03 NOTE — Care Management Important Message (Signed)
Important Message  Patient Details  Name: Diane Torres MRN: [id number] Date of Birth: [birth year]   Medicare Important Message Given:  Yes(left with secretary for nurse to deliver to patient due to contact precautions)     Tommy Medal 12/03/2018, 3:54 PM

## 2018-12-03 NOTE — Progress Notes (Signed)
PROGRESS NOTE    Diane Torres  [id number]  DOB: 1940/04/23  DOA: 12/02/2018 PCP: Celene Squibb, MD   Brief Admission Hx: 78 year old female with long history of colonic diverticulosis with multiple admissions for diverticulitis had been referred to outpatient surgery recently due to recurrent bouts of diverticulitis presents with failed outpatient management of an acute bout of sigmoid diverticulitis.  She was admitted for IV antibiotics.  Repeat CT scan did not reveal abscess formation.  MDM/Assessment & Plan:   1. Recurrent sigmoid diverticulitis-CT was repeated on admission and it did not show any findings of an abscess.  She does have persistent diverticulitis and she is being treated with IV antibiotics since she failed 2 courses of oral antibiotic treatments.  Continue IV ceftriaxone and metronidazole and supportive therapy.  Clear liquid diet is being tolerated at this time. 2. Hypothyroidism-resume home levothyroxine. 3. GERD-Protonix ordered for GI protection. 4. Stage III CKD-her creatinine is stable to slightly improved with hydration. 5. Diffuse colonic diverticulosis-patient has been referred to outpatient surgery for consideration of operative management.  She has an appointment with [facility]. 6. Diarrhea-patient reports symptoms seem to be improving, C. difficile testing has been negative.  GI path panel testing pending.  DVT prophylaxis: Lovenox Code Status: Full Family Communication: Patient updated at bedside, verbalized understanding Disposition Plan: Continue inpatient management for IV antibiotics   Consultants:    Procedures:    Antimicrobials:  Ceftriaxone/metronidazole 12/02/2018 >>  Subjective: Patient reports she still feels pain in the left lower quadrant but she has noticed some improvement.  She can only tolerate a clear liquid diet at this time.  She reports that the diarrhea is slowing down.  Objective: Vitals:   12/02/18 1616 12/02/18 1936 12/02/18 2104 12/03/18 0602  BP: (!) 142/70  (!) 147/56 (!) 122/48  Pulse: 84  80 84  Resp: 18  16 17   Temp:   98.5 F (36.9 C) 98.7 F (37.1 C)  TempSrc:   Oral   SpO2: (!) 89% 94% 100% 94%  Weight:      Height:        Intake/Output Summary (Last 24 hours) at 12/03/2018 1258 Last data filed at 12/03/2018 0900 Gross per 24 hour  Intake 1478.67 ml  Output -  Net 1478.67 ml   Filed Weights   12/02/18 0855  Weight: 75.8 kg     REVIEW OF SYSTEMS  As per history otherwise all reviewed and reported negative  Exam:  General exam: Awake, alert, no distress, cooperative and pleasant Respiratory system: Clear. No increased work of breathing. Cardiovascular system: S1 & S2 heard. No JVD, murmurs, gallops, clicks or pedal edema. Gastrointestinal system: Abdomen is nondistended, soft and tenderness in the right lower quadrant and left lower quadrant with deep palpation no guarding. Normal bowel sounds heard. Central nervous system: Alert and oriented. No focal neurological deficits. Extremities: no CCE.  Data Reviewed: Basic Metabolic Panel: Recent Labs  Lab 12/02/18 0859 12/03/18 0617  NA 139 137  K 4.5 3.9  CL 104 107  CO2 24 21*  GLUCOSE 106* 88  BUN 18 16  CREATININE 1.38* 1.14*  CALCIUM 9.4 8.3*  MG  --  2.2   Liver Function Tests: Recent Labs  Lab 12/02/18 0859 12/03/18 0617  AST 25 17  ALT 25 17  ALKPHOS 74 59  BILITOT 0.6 0.9  PROT 8.0 6.4*  ALBUMIN 4.0 3.2*   No results for input(s): LIPASE, AMYLASE in the last 168 hours. No results for  input(s): AMMONIA in the last 168 hours. CBC: Recent Labs  Lab 12/02/18 0859 12/03/18 0617  WBC 9.3 8.1  NEUTROABS 6.1 4.7  HGB 12.7 11.0*  HCT 42.0 34.8*  MCV 97.0 95.3  PLT 297 237   Cardiac Enzymes: No results for input(s): CKTOTAL, CKMB, CKMBINDEX, TROPONINI in the last 168 hours. CBG (last 3)  No results for input(s): GLUCAP in the last 72 hours. Recent Results (from the  past 240 hour(s))  SARS CORONAVIRUS 2 (TAT 6-24 HRS) Nasopharyngeal Nasopharyngeal Swab     Status: None   Collection Time: 12/02/18 11:11 AM   Specimen: Nasopharyngeal Swab  Result Value Ref Range Status   SARS Coronavirus 2 NEGATIVE NEGATIVE Final    Comment: (NOTE) SARS-CoV-2 target nucleic acids are NOT DETECTED. The SARS-CoV-2 RNA is generally detectable in upper and lower respiratory specimens during the acute phase of infection. Negative results do not preclude SARS-CoV-2 infection, do not rule out co-infections with other pathogens, and should not be used as the sole basis for treatment or other patient management decisions. Negative results must be combined with clinical observations, patient history, and epidemiological information. The expected result is Negative. Fact Sheet for Patients: SugarRoll.be Fact Sheet for Healthcare Providers: https://www.woods-mathews.com/ This test is not yet approved or cleared by the [place] FDA and  has been authorized for detection and/or diagnosis of SARS-CoV-2 by FDA under an Emergency Use Authorization (EUA). This EUA will remain  in effect (meaning this test can be used) for the duration of the COVID-19 declaration under Section 56 4(b)(1) of the Act, 21 U.S.C. section 360bbb-3(b)(1), unless the authorization is terminated or revoked sooner. Performed at [facility], [place] [address] [place], [place] [address]      Studies: Ct Abdomen Pelvis W Contrast  Result Date: 12/02/2018 CLINICAL DATA:  Acute left lower quadrant abdominal pain. History of diverticulitis. Recently diagnosed diverticulitis, currently taking antibiotics. EXAM: CT ABDOMEN AND PELVIS WITH CONTRAST TECHNIQUE: Multidetector CT imaging of the abdomen and pelvis was performed using the standard protocol following bolus administration of intravenous contrast. CONTRAST:  32mL OMNIPAQUE IOHEXOL 300 MG/ML  SOLN  COMPARISON:  11/24/2018 FINDINGS: Lower chest: Unremarkable. Hepatobiliary: Diffuse low density of the liver relative to the spleen. Small calcified liver granulomata. Minimal more focal fat deposition in the liver adjacent to the falciform ligament. Normal appearing gallbladder. Pancreas: Unremarkable. No pancreatic ductal dilatation or surrounding inflammatory changes. Spleen: Normal in size without focal abnormality. Adrenals/Urinary Tract: Adrenal glands are unremarkable. Kidneys are normal, without renal calculi, focal lesion, or hydronephrosis. Bladder is unremarkable. Stomach/Bowel: Multiple colonic diverticula, most numerous in the sigmoid region. Mildly progressive soft tissue stranding and edema at the level of the proximal sigmoid colon. No extraluminal fluid or gas collections are seen. Unremarkable stomach, small bowel and appendix. Vascular/Lymphatic: Atheromatous arterial calcifications without aneurysm. No enlarged lymph nodes. Reproductive: Status post hysterectomy. No adnexal masses. Other: Small umbilical hernia containing fat. Musculoskeletal: Lumbar and lower thoracic spine degenerative changes. IMPRESSION: 1. Mildly progressive changes of diverticulitis at the level of the proximal sigmoid colon without abscess. 2. Extensive colonic diverticulosis. 3. Diffuse hepatic steatosis. Electronically Signed   By: Claudie Revering M.D.   On: 12/02/2018 13:19   Scheduled Meds: . enoxaparin (LOVENOX) injection  40 mg Subcutaneous Q24H  . loratadine  10 mg Oral q morning - 10a  . losartan  100 mg Oral Daily  . pantoprazole  40 mg Oral Daily  . pneumococcal 23 valent vaccine  0.5 mL Intramuscular Tomorrow-1000  .  simvastatin  40 mg Oral QHS   Continuous Infusions: . sodium chloride 75 mL/hr at 12/03/18 0600  . cefTRIAXone (ROCEPHIN)  IV 2 g (12/03/18 0942)  . metronidazole 500 mg (12/03/18 1235)    Principal Problem:   Acute diverticulitis Active Problems:   Impaired fasting glucose   Mixed  hyperlipidemia   Hypothyroidism   GERD (gastroesophageal reflux disease)   Gastroesophageal reflux disease with esophagitis   LLQ pain   CKD (chronic kidney disease) stage 3, GFR 30-59 ml/min   Diarrhea   Diverticulosis  Time spent:   Irwin Brakeman, MD [facility] 12/03/2018, 12:58 PM    LOS: 1 day  How to contact the [facility] Attending or Consulting provider [place] or covering provider during after hours [place], for this patient?  1. Check the care team in [facility] and look for a) attending/consulting TRH provider listed and b) the [facility] team listed 2. Log into [email or web address] and use [place]'s universal password to access. If you do not have the password, please contact the hospital operator. 3. Locate the [facility] provider you are looking for under [facility] and page to a number that you can be directly reached. 4. If you still have difficulty reaching the provider, please page the [facility] (Director on Call) for the Hospitalists listed on amion for assistance.

## 2018-12-04 DIAGNOSIS — R7301 Impaired fasting glucose: Secondary | ICD-10-CM

## 2018-12-04 LAB — COMPREHENSIVE METABOLIC PANEL
ALT: 14 U/L (ref 0–44)
AST: 15 U/L (ref 15–41)
Albumin: 3.1 g/dL — ABNORMAL LOW (ref 3.5–5.0)
Alkaline Phosphatase: 53 U/L (ref 38–126)
Anion gap: 9 (ref 5–15)
BUN: 12 mg/dL (ref 8–23)
CO2: 21 mmol/L — ABNORMAL LOW (ref 22–32)
Calcium: 8.2 mg/dL — ABNORMAL LOW (ref 8.9–10.3)
Chloride: 110 mmol/L (ref 98–111)
Creatinine, Ser: 1.09 mg/dL — ABNORMAL HIGH (ref 0.44–1.00)
GFR calc Af Amer: 56 mL/min — ABNORMAL LOW (ref >60)
GFR calc non Af Amer: 48 mL/min — ABNORMAL LOW (ref >60)
Glucose, Bld: 84 mg/dL (ref 70–99)
Potassium: 3.7 mmol/L (ref 3.5–5.1)
Sodium: 140 mmol/L (ref 135–145)
Total Bilirubin: 0.3 mg/dL (ref 0.3–1.2)
Total Protein: 6.1 g/dL — ABNORMAL LOW (ref 6.5–8.1)

## 2018-12-04 LAB — GASTROINTESTINAL PANEL BY PCR, STOOL (REPLACES STOOL CULTURE)

## 2018-12-04 LAB — CBC WITH DIFFERENTIAL/PLATELET
Abs Immature Granulocytes: 0.01 10*3/uL (ref 0.00–0.07)
Basophils Absolute: 0 10*3/uL (ref 0.0–0.1)
Basophils Relative: 1 %
Eosinophils Absolute: 0.2 10*3/uL (ref 0.0–0.5)
Eosinophils Relative: 4 %
HCT: 34.6 % — ABNORMAL LOW (ref 36.0–46.0)
Hemoglobin: 10.5 g/dL — ABNORMAL LOW (ref 12.0–15.0)
Immature Granulocytes: 0 %
Lymphocytes Relative: 36 %
Lymphs Abs: 2.1 10*3/uL (ref 0.7–4.0)
MCH: 29.2 pg (ref 26.0–34.0)
MCHC: 30.3 g/dL (ref 30.0–36.0)
MCV: 96.1 fL (ref 80.0–100.0)
Monocytes Absolute: 0.5 10*3/uL (ref 0.1–1.0)
Monocytes Relative: 9 %
Neutro Abs: 2.9 10*3/uL (ref 1.7–7.7)
Neutrophils Relative %: 50 %
Platelets: 220 10*3/uL (ref 150–400)
RBC: 3.6 MIL/uL — ABNORMAL LOW (ref 3.87–5.11)
RDW: 14.4 % (ref 11.5–15.5)
WBC: 5.8 10*3/uL (ref 4.0–10.5)
nRBC: 0 % (ref 0.0–0.2)

## 2018-12-04 LAB — MAGNESIUM: Magnesium: 2.2 mg/dL (ref 1.7–2.4)

## 2018-12-04 NOTE — Progress Notes (Signed)
PROGRESS NOTE  Diane Torres  [id number]  DOB: [birth year]  DOA: 12/02/2018 PCP: Celene Squibb, MD   Brief Admission Hx: 78 year old female with long history of colonic diverticulosis with multiple admissions for diverticulitis had been referred to outpatient surgery recently due to recurrent bouts of diverticulitis presents with failed outpatient management of an acute bout of sigmoid diverticulitis.  She was admitted for IV antibiotics.  Repeat CT scan did not reveal abscess formation.  MDM/Assessment & Plan:   1. Recurrent sigmoid diverticulitis-CT was repeated on admission and it did not show any findings of an abscess.  She does have persistent diverticulitis and she is being treated with IV antibiotics since she failed 2 courses of oral antibiotic treatments.  Continue IV ceftriaxone and metronidazole and supportive therapy.  Clear liquid diet is being tolerated at this time. 2. Hypothyroidism-resume home levothyroxine. 3. GERD-Protonix ordered for GI protection. 4. Stage III CKD-her creatinine is stable to slightly improved with hydration. 5. Diffuse colonic diverticulosis-patient has been referred to outpatient surgery for consideration of operative management.  She has an appointment with [facility] [place] [facility]. 6. Diarrhea-patient reports symptoms seem to be improving, C. difficile testing has been negative.  GI path panel testing pending.  DVT prophylaxis: Lovenox Code Status: Full Family Communication: Patient updated at bedside, verbalized understanding Disposition Plan: Continue inpatient management for IV antibiotics  Consultants:    Procedures:    Antimicrobials:  Ceftriaxone/metronidazole 12/02/2018 >>  Subjective: Patient reports pain is improving.  Pt wants to advance diet.    Objective: Vitals:   12/03/18 2118 12/04/18 0542 12/04/18 0825 12/04/18 1400  BP: (!) 158/73 (!) 137/45  (!) 149/63  Pulse: 93 73  85  Resp: 18 14  16   Temp: 98.5 F (36.9  C) 98.2 F (36.8 C)  98.4 F (36.9 C)  TempSrc: Oral Oral    SpO2: 96% 95% 96% 95%  Weight:  75.6 kg    Height:        Intake/Output Summary (Last 24 hours) at 12/04/2018 1616 Last data filed at 12/04/2018 1600 Gross per 24 hour  Intake 3812.47 ml  Output -  Net 3812.47 ml   Filed Weights   12/02/18 0855 12/04/18 0542  Weight: 75.8 kg 75.6 kg   REVIEW OF SYSTEMS  As per history otherwise all reviewed and reported negative  Exam:  General exam: Awake, alert, no distress, cooperative and pleasant Respiratory system: Clear. No increased work of breathing. Cardiovascular system: S1 & S2 heard. No JVD, murmurs, gallops, clicks or pedal edema. Gastrointestinal system: Abdomen is nondistended, soft and tenderness in the right lower quadrant and left lower quadrant with deep palpation no guarding. Normal bowel sounds heard. Central nervous system: Alert and oriented. No focal neurological deficits. Extremities: no CCE.  Data Reviewed: Basic Metabolic Panel: Recent Labs  Lab 12/02/18 0859 12/03/18 0617 12/04/18 0628  NA 139 137 140  K 4.5 3.9 3.7  CL 104 107 110  CO2 24 21* 21*  GLUCOSE 106* 88 84  BUN 18 16 12   CREATININE 1.38* 1.14* 1.09*  CALCIUM 9.4 8.3* 8.2*  MG  --  2.2 2.2   Liver Function Tests: Recent Labs  Lab 12/02/18 0859 12/03/18 0617 12/04/18 0628  AST 25 17 15   ALT 25 17 14   ALKPHOS 74 59 53  BILITOT 0.6 0.9 0.3  PROT 8.0 6.4* 6.1*  ALBUMIN 4.0 3.2* 3.1*   No results for input(s): LIPASE, AMYLASE in the last 168 hours. No results for input(s): AMMONIA in  the last 168 hours. CBC: Recent Labs  Lab 12/02/18 0859 12/03/18 0617 12/04/18 0628  WBC 9.3 8.1 5.8  NEUTROABS 6.1 4.7 2.9  HGB 12.7 11.0* 10.5*  HCT 42.0 34.8* 34.6*  MCV 97.0 95.3 96.1  PLT 297 237 220   Cardiac Enzymes: No results for input(s): CKTOTAL, CKMB, CKMBINDEX, TROPONINI in the last 168 hours. CBG (last 3)  No results for input(s): GLUCAP in the last 72 hours.  Recent Results (from the past 240 hour(s))  SARS CORONAVIRUS 2 (TAT 6-24 HRS) Nasopharyngeal Nasopharyngeal Swab     Status: None   Collection Time: 12/02/18 11:11 AM   Specimen: Nasopharyngeal Swab  Result Value Ref Range Status   SARS Coronavirus 2 NEGATIVE NEGATIVE Final    Comment: (NOTE) SARS-CoV-2 target nucleic acids are NOT DETECTED. The SARS-CoV-2 RNA is generally detectable in upper and lower respiratory specimens during the acute phase of infection. Negative results do not preclude SARS-CoV-2 infection, do not rule out co-infections with other pathogens, and should not be used as the sole basis for treatment or other patient management decisions. Negative results must be combined with clinical observations, patient history, and epidemiological information. The expected result is Negative. Fact Sheet for Patients: SugarRoll.be Fact Sheet for Healthcare Providers: https://www.woods-mathews.com/ This test is not yet approved or cleared by the [place] FDA and  has been authorized for detection and/or diagnosis of SARS-CoV-2 by FDA under an Emergency Use Authorization (EUA). This EUA will remain  in effect (meaning this test can be used) for the duration of the COVID-19 declaration under Section 56 4(b)(1) of the Act, 21 U.S.C. section 360bbb-3(b)(1), unless the authorization is terminated or revoked sooner. Performed at [facility], [place] [address], [place], [place] [address]   Gastrointestinal Panel by PCR , Stool     Status: None   Collection Time: 12/03/18 12:03 PM   Specimen: Stool  Result Value Ref Range Status   Campylobacter species NOT DETECTED NOT DETECTED Final   Plesimonas shigelloides NOT DETECTED NOT DETECTED Final   Salmonella species NOT DETECTED NOT DETECTED Final   Yersinia enterocolitica NOT DETECTED NOT DETECTED Final   Vibrio species NOT DETECTED NOT DETECTED Final   Vibrio cholerae NOT DETECTED  NOT DETECTED Final   Enteroaggregative E coli (EAEC) NOT DETECTED NOT DETECTED Final   Enteropathogenic E coli (EPEC) NOT DETECTED NOT DETECTED Final   Enterotoxigenic E coli (ETEC) NOT DETECTED NOT DETECTED Final   Shiga like toxin producing E coli (STEC) NOT DETECTED NOT DETECTED Final   Shigella/Enteroinvasive E coli (EIEC) NOT DETECTED NOT DETECTED Final   Cryptosporidium NOT DETECTED NOT DETECTED Final   Cyclospora cayetanensis NOT DETECTED NOT DETECTED Final   Entamoeba histolytica NOT DETECTED NOT DETECTED Final   Giardia lamblia NOT DETECTED NOT DETECTED Final   Adenovirus F40/41 NOT DETECTED NOT DETECTED Final   Astrovirus NOT DETECTED NOT DETECTED Final   Norovirus GI/GII NOT DETECTED NOT DETECTED Final   Rotavirus A NOT DETECTED NOT DETECTED Final   Sapovirus (I, II, IV, and V) NOT DETECTED NOT DETECTED Final    Comment: Performed at [facility], [place] [place], [place] [address]  C Difficile Quick Screen w PCR reflex     Status: None   Collection Time: 12/03/18 12:03 PM   Specimen: Stool  Result Value Ref Range Status   C Diff antigen NEGATIVE NEGATIVE Final   C Diff toxin NEGATIVE NEGATIVE Final   C Diff interpretation No C. difficile detected.  Final    Comment: Performed at [facility], [address] [place], [place] [address]    Studies: No results found. Scheduled Meds: . enoxaparin (LOVENOX) injection  40 mg Subcutaneous Q24H  . loratadine  10 mg Oral q morning - 10a  . losartan  100 mg Oral Daily  . pantoprazole  40 mg Oral Daily  . pneumococcal 23 valent vaccine  0.5 mL Intramuscular Tomorrow-1000  . simvastatin  40 mg Oral QHS   Continuous Infusions: . sodium chloride 50 mL/hr at 12/04/18 1037  . cefTRIAXone (ROCEPHIN)  IV 2 g (12/04/18 1041)  . metronidazole 500 mg (12/04/18 1321)   Principal Problem:   Acute diverticulitis Active Problems:   Impaired fasting glucose   Mixed hyperlipidemia   Hypothyroidism   GERD  (gastroesophageal reflux disease)   Gastroesophageal reflux disease with esophagitis   LLQ pain   CKD (chronic kidney disease) stage 3, GFR 30-59 ml/min   Diarrhea   Diverticulosis  Time spent:   Irwin Brakeman, MD [facility] 12/04/2018, 4:16 PM    LOS: 2 days  How to contact the [facility] Attending or Consulting provider [place] or covering provider during after hours [place], for this patient?  1. Check the care team in [facility] and look for a) attending/consulting TRH provider listed and b) the [facility] team listed 2. Log into [email or web address] and use [place]'s universal password to access. If you do not have the password, please contact the hospital operator. 3. Locate the [facility] provider you are looking for under [facility] and page to a number that you can be directly reached. 4. If you still have difficulty reaching the provider, please page the [facility] (Director on Call) for the Hospitalists listed on amion for assistance.

## 2018-12-04 NOTE — Plan of Care (Signed)

## 2018-12-05 LAB — CBC WITH DIFFERENTIAL/PLATELET
Abs Immature Granulocytes: 0.01 10*3/uL (ref 0.00–0.07)
Basophils Absolute: 0.1 10*3/uL (ref 0.0–0.1)
Basophils Relative: 1 %
Eosinophils Absolute: 0.3 10*3/uL (ref 0.0–0.5)
Eosinophils Relative: 4 %
HCT: 33.5 % — ABNORMAL LOW (ref 36.0–46.0)
Hemoglobin: 10.6 g/dL — ABNORMAL LOW (ref 12.0–15.0)
Immature Granulocytes: 0 %
Lymphocytes Relative: 27 %
Lymphs Abs: 1.7 10*3/uL (ref 0.7–4.0)
MCH: 30.1 pg (ref 26.0–34.0)
MCHC: 31.6 g/dL (ref 30.0–36.0)
MCV: 95.2 fL (ref 80.0–100.0)
Monocytes Absolute: 0.6 10*3/uL (ref 0.1–1.0)
Monocytes Relative: 9 %
Neutro Abs: 3.9 10*3/uL (ref 1.7–7.7)
Neutrophils Relative %: 59 %
Platelets: 233 10*3/uL (ref 150–400)
RBC: 3.52 MIL/uL — ABNORMAL LOW (ref 3.87–5.11)
RDW: 13.8 % (ref 11.5–15.5)
WBC: 6.5 10*3/uL (ref 4.0–10.5)
nRBC: 0 % (ref 0.0–0.2)

## 2018-12-05 LAB — COMPREHENSIVE METABOLIC PANEL
ALT: 15 U/L (ref 0–44)
AST: 17 U/L (ref 15–41)
Albumin: 3.1 g/dL — ABNORMAL LOW (ref 3.5–5.0)
Alkaline Phosphatase: 51 U/L (ref 38–126)
Anion gap: 9 (ref 5–15)
BUN: 11 mg/dL (ref 8–23)
CO2: 21 mmol/L — ABNORMAL LOW (ref 22–32)
Calcium: 8.4 mg/dL — ABNORMAL LOW (ref 8.9–10.3)
Chloride: 109 mmol/L (ref 98–111)
Creatinine, Ser: 1.02 mg/dL — ABNORMAL HIGH (ref 0.44–1.00)
GFR calc Af Amer: >60 mL/min (ref >60)
GFR calc non Af Amer: 52 mL/min — ABNORMAL LOW (ref >60)
Glucose, Bld: 83 mg/dL (ref 70–99)
Potassium: 3.9 mmol/L (ref 3.5–5.1)
Sodium: 139 mmol/L (ref 135–145)
Total Bilirubin: 0.7 mg/dL (ref 0.3–1.2)
Total Protein: 6.3 g/dL — ABNORMAL LOW (ref 6.5–8.1)

## 2018-12-05 LAB — MAGNESIUM: Magnesium: 2 mg/dL (ref 1.7–2.4)

## 2018-12-05 MED ORDER — METRONIDAZOLE 500 MG PO TABS: 500.0000 mg | ORAL_TABLET | Freq: Two times a day (BID) | ORAL | 0 refills | Status: AC

## 2018-12-05 MED ORDER — CEFDINIR 300 MG PO CAPS: 300.0000 mg | ORAL_CAPSULE | Freq: Two times a day (BID) | ORAL | 0 refills | Status: AC

## 2018-12-05 MED ORDER — FLUCONAZOLE 150 MG PO TABS: 150.0000 mg | ORAL_TABLET | Freq: Every day | ORAL | 0 refills | Status: DC | PRN

## 2018-12-05 MED ORDER — PNEUMOCOCCAL VAC POLYVALENT 25 MCG/0.5ML IJ INJ
0.5000 mL | INJECTION | Freq: Once | INTRAMUSCULAR | Status: AC
  Administered 2018-12-05: 0.5 mL via INTRAMUSCULAR
  Filled 2018-12-05: qty 0.5

## 2018-12-05 NOTE — Progress Notes (Signed)
Nsg Discharge Note  Admit Date:  12/02/2018 Discharge date: 12/05/2018   Diane Torres to be D/C'd Home per MD order.  AVS completed.  Copy for chart, and copy for patient signed, and dated. Patient/caregiver able to verbalize understanding.  Discharge Medication: Allergies as of 12/05/2018      Reactions   Bee Venom Anaphylaxis      Medication List    STOP taking these medications   amoxicillin-clavulanate 875-125 MG tablet Commonly known as: Augmentin     TAKE these medications   acetaminophen 325 MG tablet Commonly known as: TYLENOL Take 650 mg by mouth every 6 (six) hours as needed.   cefdinir 300 MG capsule Commonly known as: OMNICEF Take 1 capsule (300 mg total) by mouth 2 (two) times daily for 3 days.   fluconazole 150 MG tablet Commonly known as: DIFLUCAN Take 1 tablet (150 mg total) by mouth daily as needed (yeast infection). Repeat if needed   loratadine 10 MG tablet Commonly known as: CLARITIN Take 10 mg by mouth every morning.   losartan 100 MG tablet Commonly known as: COZAAR Take 100 mg by mouth daily.   metroNIDAZOLE 500 MG tablet Commonly known as: FLAGYL Take 1 tablet (500 mg total) by mouth 2 (two) times daily for 3 days.   pantoprazole 40 MG tablet Commonly known as: PROTONIX TAKE ONE TABLET BY MOUTH ONCE DAILY.   simvastatin 40 MG tablet Commonly known as: ZOCOR Take 40 mg by mouth at bedtime.       Discharge Assessment: Vitals:   12/04/18 2153 12/05/18 0608  BP: (!) 149/56   Pulse: 82 72  Resp: 16 16  Temp: 98.5 F (36.9 C) 98 F (36.7 C)  SpO2: 94% 94%   Skin clean, dry and intact without evidence of skin break down, no evidence of skin tears noted. IV catheter discontinued intact. Site without signs and symptoms of complications - no redness or edema noted at insertion site, patient denies c/o pain - only slight tenderness at site.  Dressing with slight pressure applied.  D/c Instructions-Education: Discharge instructions  given to patient/family with verbalized understanding. D/c education completed with patient/family including follow up instructions, medication list, d/c activities limitations if indicated, with other d/c instructions as indicated by MD - patient able to verbalize understanding, all questions fully answered. Patient instructed to return to ED, call 911, or call MD for any changes in condition.  Patient escorted via [place], and D/C home via private auto.  Loa Socks, RN 12/05/2018 11:39 AM

## 2018-12-05 NOTE — Discharge Summary (Addendum)
Physician Discharge Summary  Diane Torres [id number] DOB: Apr 30, 1940 DOA: 12/02/2018  PCP: Celene Squibb, MD GI: [facility]: [facility]  Admit date: 12/02/2018 Discharge date: 12/05/2018  Admitted From: Home  Disposition: Home   Recommendations for Outpatient Follow-up:  1. Follow up with PCP in 2 weeks 2. Follow up with surgery in 1-2 weeks 3. Follow up with GI in 1 week  Discharge Condition: STABLE   CODE STATUS: FULL    Brief Hospitalization Summary: Please see all hospital notes, images, labs for full details of the hospitalization. HPI: Diane Torres is a 78 y.o. female with a long history of colonic diverticulosis who has had multiple recent bouts of diverticulitis in the last year.  She was recently diagnosed with diverticulitis approximately 1 week ago and was treated with a course of oral antibiotics including ciprofloxacin and metronidazole which was completed and she subsequently continued to have symptoms of left lower quadrant abdominal pain and was seen by GI.  They prescribed a course of oral Augmentin and after several days the patient reported no improvement at that point it was felt that the patient had failed outpatient management and she was sent to the emergency department so that she can be admitted for IV antibiotics.  The patient had an initial CT done approximately 8 days ago that showed sigmoid diverticulitis.  Repeat CT scan was done today with findings of persistent diverticulitis but no abscess formation.  Patient has significant diverticulosis.  The patient was referred to general surgery by her GI service because of recurrent bouts of diverticulitis.  The patient was afebrile with a normal white blood cell count.  She mainly complains of nausea vomiting and diarrhea.  She has persistent left lower quadrant abdominal pain and is not eating and drinking well.  She has CKD stage III which is stable.  Her creatinine is 1.38.  She has a normal  hemoglobin of 12.7.  Brief Admission Hx: 78 year old female with long history of colonic diverticulosis with multiple admissions for diverticulitis had been referred to outpatient surgery recently due to recurrent bouts of diverticulitis presents with failed outpatient management of an acute bout of sigmoid diverticulitis.  She was admitted for IV antibiotics.  Repeat CT scan did not reveal abscess formation.  MDM/Assessment & Plan:   1. Recurrent sigmoid diverticulitis-CT was repeated on admission and it did not show any findings of an abscess.  She does have persistent diverticulitis and she was treated with IV antibiotics since she failed 2 courses of oral antibiotic treatments.  She was given IV ceftriaxone and metronidazole and supportive therapy. She responded well and feeling better and tolerating diet as it was advanced to soft diet. She wants to go home. She will finish course of antibiotics orally and follow up outpatient with general surgery and GI and PCP. 2. Hypothyroidism-resume home levothyroxine. 3. GERD-Protonix ordered for GI protection. 4. Stage III CKD-her creatinine is improved with hydration.  5. Diffuse colonic diverticulosis-patient has been referred to outpatient surgery for consideration of operative management.  She has an appointment with [facility]. 6. Diarrhea-patient reports symptoms seem to be improving, C. difficile testing has been negative.  GI path panel testing negative.  DVT prophylaxis: Lovenox Code Status: Full Family Communication: Patient updated at bedside, verbalized understanding Disposition Plan: Home with close outpatient follow up  Consultants:    Procedures:    Antimicrobials:  Ceftriaxone/metronidazole 12/02/2018 >> Discharge Diagnoses:  Principal Problem:   Acute diverticulitis Active Problems:  Impaired fasting glucose   Mixed hyperlipidemia   Hypothyroidism   GERD (gastroesophageal reflux disease)    Gastroesophageal reflux disease with esophagitis   LLQ pain   CKD (chronic kidney disease) stage 3, GFR 30-59 ml/min   Diarrhea   Diverticulosis   Discharge Instructions:  Allergies as of 12/05/2018      Reactions   Bee Venom Anaphylaxis      Medication List    STOP taking these medications   amoxicillin-clavulanate 875-125 MG tablet Commonly known as: Augmentin     TAKE these medications   acetaminophen 325 MG tablet Commonly known as: TYLENOL Take 650 mg by mouth every 6 (six) hours as needed.   cefdinir 300 MG capsule Commonly known as: OMNICEF Take 1 capsule (300 mg total) by mouth 2 (two) times daily for 3 days.   fluconazole 150 MG tablet Commonly known as: DIFLUCAN Take 1 tablet (150 mg total) by mouth daily as needed (yeast infection). Repeat if needed   loratadine 10 MG tablet Commonly known as: CLARITIN Take 10 mg by mouth every morning.   losartan 100 MG tablet Commonly known as: COZAAR Take 100 mg by mouth daily.   metroNIDAZOLE 500 MG tablet Commonly known as: FLAGYL Take 1 tablet (500 mg total) by mouth 2 (two) times daily for 3 days.   pantoprazole 40 MG tablet Commonly known as: PROTONIX TAKE ONE TABLET BY MOUTH ONCE DAILY.   simvastatin 40 MG tablet Commonly known as: ZOCOR Take 40 mg by mouth at bedtime.      Follow-up Information    Celene Squibb, MD. Schedule an appointment as soon as possible for a visit in 2 week(s).   Specialty: Internal Medicine Why: Hospital Follow Up Contact information: [place] [facility] [address] [phone]        Surgery, [place]. Schedule an appointment as soon as possible for a visit in 2 week(s).   Specialty: General Surgery Contact information: [place] [address] [phone]        Erenest Rasher, PA-C. Schedule an appointment as soon as possible for a visit in 1 week(s).   Specialty: Gastroenterology Why: Hospital Follow Up  Contact  information: [place] [address] [phone]          Allergies  Allergen Reactions  . Bee Venom Anaphylaxis   Allergies as of 12/05/2018      Reactions   Bee Venom Anaphylaxis      Medication List    STOP taking these medications   amoxicillin-clavulanate 875-125 MG tablet Commonly known as: Augmentin     TAKE these medications   acetaminophen 325 MG tablet Commonly known as: TYLENOL Take 650 mg by mouth every 6 (six) hours as needed.   cefdinir 300 MG capsule Commonly known as: OMNICEF Take 1 capsule (300 mg total) by mouth 2 (two) times daily for 3 days.   fluconazole 150 MG tablet Commonly known as: DIFLUCAN Take 1 tablet (150 mg total) by mouth daily as needed (yeast infection). Repeat if needed   loratadine 10 MG tablet Commonly known as: CLARITIN Take 10 mg by mouth every morning.   losartan 100 MG tablet Commonly known as: COZAAR Take 100 mg by mouth daily.   metroNIDAZOLE 500 MG tablet Commonly known as: FLAGYL Take 1 tablet (500 mg total) by mouth 2 (two) times daily for 3 days.   pantoprazole 40 MG tablet Commonly known as: PROTONIX TAKE ONE TABLET BY  MOUTH ONCE DAILY.   simvastatin 40 MG tablet Commonly known as: ZOCOR Take 40 mg by mouth at bedtime.       Procedures/Studies: Ct Abdomen Pelvis W Contrast  Result Date: 12/02/2018 CLINICAL DATA:  Acute left lower quadrant abdominal pain. History of diverticulitis. Recently diagnosed diverticulitis, currently taking antibiotics. EXAM: CT ABDOMEN AND PELVIS WITH CONTRAST TECHNIQUE: Multidetector CT imaging of the abdomen and pelvis was performed using the standard protocol following bolus administration of intravenous contrast. CONTRAST:  30mL OMNIPAQUE IOHEXOL 300 MG/ML  SOLN COMPARISON:  11/24/2018 FINDINGS: Lower chest: Unremarkable. Hepatobiliary: Diffuse low density of the liver relative to the spleen. Small calcified liver granulomata. Minimal more focal fat deposition in  the liver adjacent to the falciform ligament. Normal appearing gallbladder. Pancreas: Unremarkable. No pancreatic ductal dilatation or surrounding inflammatory changes. Spleen: Normal in size without focal abnormality. Adrenals/Urinary Tract: Adrenal glands are unremarkable. Kidneys are normal, without renal calculi, focal lesion, or hydronephrosis. Bladder is unremarkable. Stomach/Bowel: Multiple colonic diverticula, most numerous in the sigmoid region. Mildly progressive soft tissue stranding and edema at the level of the proximal sigmoid colon. No extraluminal fluid or gas collections are seen. Unremarkable stomach, small bowel and appendix. Vascular/Lymphatic: Atheromatous arterial calcifications without aneurysm. No enlarged lymph nodes. Reproductive: Status post hysterectomy. No adnexal masses. Other: Small umbilical hernia containing fat. Musculoskeletal: Lumbar and lower thoracic spine degenerative changes. IMPRESSION: 1. Mildly progressive changes of diverticulitis at the level of the proximal sigmoid colon without abscess. 2. Extensive colonic diverticulosis. 3. Diffuse hepatic steatosis. Electronically Signed   By: Claudie Revering M.D.   On: 12/02/2018 13:19   Ct Abdomen Pelvis W Contrast  Addendum Date: 11/29/2018   ADDENDUM REPORT: 11/29/2018 13:34 ADDENDUM: History is actually left lower quadrant abdominal pain, not right lower quadrant. Electronically Signed   By: Marijo Conception M.D.   On: 11/29/2018 13:34   Result Date: 11/29/2018 CLINICAL DATA:  Acute right lower quadrant abdominal pain. EXAM: CT ABDOMEN AND PELVIS WITH CONTRAST TECHNIQUE: Multidetector CT imaging of the abdomen and pelvis was performed using the standard protocol following bolus administration of intravenous contrast. CONTRAST:  84mL OMNIPAQUE IOHEXOL 300 MG/ML  SOLN COMPARISON:  Jun 30, 2017. FINDINGS: Lower chest: No acute abnormality. Hepatobiliary: No gallstones or biliary dilatation is noted. Hepatic steatosis is noted.  Pancreas: Unremarkable. No pancreatic ductal dilatation or surrounding inflammatory changes. Spleen: Normal in size without focal abnormality. Adrenals/Urinary Tract: Adrenal glands are unremarkable. Kidneys are normal, without renal calculi, focal lesion, or hydronephrosis. Bladder is unremarkable. Stomach/Bowel: The stomach appears normal. There is no evidence of bowel obstruction. The appendix appears normal. Extensive sigmoid diverticulosis is noted. Diverticulitis is noted at the junction of the descending and sigmoid colon without definite evidence of perforation or abscess. Vascular/Lymphatic: Aortic atherosclerosis. No enlarged abdominal or pelvic lymph nodes. Reproductive: Status post hysterectomy. No adnexal masses. Other: No abdominal wall hernia or abnormality. No abdominopelvic ascites. Musculoskeletal: No acute or significant osseous findings. IMPRESSION: Focal diverticulitis is noted at the junction of the descending and sigmoid colon without definite evidence of abscess or perforation. These results will be called to the ordering clinician or representative by the Radiologist Assistant, and communication documented in the PACS or zVision Dashboard. Hepatic steatosis. Aortic Atherosclerosis (ICD10-I70.0). Electronically Signed: By: Marijo Conception M.D. On: 11/24/2018 13:26     Subjective: Pt reports that she is feeling much better, pain is resolved, she is tolerating diet well.  Pt wants to go home.   Discharge Exam: Vitals:  12/04/18 2153 12/05/18 0608  BP: (!) 149/56   Pulse: 82 72  Resp: 16 16  Temp: 98.5 F (36.9 C) 98 F (36.7 C)  SpO2: 94% 94%   Vitals:   12/04/18 0825 12/04/18 1400 12/04/18 2153 12/05/18 0608  BP:  (!) 149/63 (!) 149/56   Pulse:  85 82 72  Resp:  16 16 16   Temp:  98.4 F (36.9 C) 98.5 F (36.9 C) 98 F (36.7 C)  TempSrc:   Oral Oral  SpO2: 96% 95% 94% 94%  Weight:    74.8 kg  Height:       General: Pt is alert, awake, not in acute  distress Cardiovascular: RRR, S1/S2 +, no rubs, no gallops Respiratory: CTA bilaterally, no wheezing, no rhonchi Abdominal: Soft, NT, ND, bowel sounds + Extremities: no edema, no cyanosis   The results of significant diagnostics from this hospitalization (including imaging, microbiology, ancillary and laboratory) are listed below for reference.     Microbiology: Recent Results (from the past 240 hour(s))  SARS CORONAVIRUS 2 (TAT 6-24 HRS) Nasopharyngeal Nasopharyngeal Swab     Status: None   Collection Time: 12/02/18 11:11 AM   Specimen: Nasopharyngeal Swab  Result Value Ref Range Status   SARS Coronavirus 2 NEGATIVE NEGATIVE Final    Comment: (NOTE) SARS-CoV-2 target nucleic acids are NOT DETECTED. The SARS-CoV-2 RNA is generally detectable in upper and lower respiratory specimens during the acute phase of infection. Negative results do not preclude SARS-CoV-2 infection, do not rule out co-infections with other pathogens, and should not be used as the sole basis for treatment or other patient management decisions. Negative results must be combined with clinical observations, patient history, and epidemiological information. The expected result is Negative. Fact Sheet for Patients: SugarRoll.be Fact Sheet for Healthcare Providers: https://www.woods-mathews.com/ This test is not yet approved or cleared by the [place] FDA and  has been authorized for detection and/or diagnosis of SARS-CoV-2 by FDA under an Emergency Use Authorization (EUA). This EUA will remain  in effect (meaning this test can be used) for the duration of the COVID-19 declaration under Section 56 4(b)(1) of the Act, 21 U.S.C. section 360bbb-3(b)(1), unless the authorization is terminated or revoked sooner. Performed at [facility], [place] [address], [place], [place] [address]   Gastrointestinal Panel by PCR , Stool     Status: None   Collection Time: 12/03/18  12:03 PM   Specimen: Stool  Result Value Ref Range Status   Campylobacter species NOT DETECTED NOT DETECTED Final   Plesimonas shigelloides NOT DETECTED NOT DETECTED Final   Salmonella species NOT DETECTED NOT DETECTED Final   Yersinia enterocolitica NOT DETECTED NOT DETECTED Final   Vibrio species NOT DETECTED NOT DETECTED Final   Vibrio cholerae NOT DETECTED NOT DETECTED Final   Enteroaggregative E coli (EAEC) NOT DETECTED NOT DETECTED Final   Enteropathogenic E coli (EPEC) NOT DETECTED NOT DETECTED Final   Enterotoxigenic E coli (ETEC) NOT DETECTED NOT DETECTED Final   Shiga like toxin producing E coli (STEC) NOT DETECTED NOT DETECTED Final   Shigella/Enteroinvasive E coli (EIEC) NOT DETECTED NOT DETECTED Final   Cryptosporidium NOT DETECTED NOT DETECTED Final   Cyclospora cayetanensis NOT DETECTED NOT DETECTED Final   Entamoeba histolytica NOT DETECTED NOT DETECTED Final   Giardia lamblia NOT DETECTED NOT DETECTED Final   Adenovirus F40/41 NOT DETECTED NOT DETECTED Final   Astrovirus NOT DETECTED NOT DETECTED Final   Norovirus GI/GII NOT DETECTED NOT DETECTED Final   Rotavirus  A NOT DETECTED NOT DETECTED Final   Sapovirus (I, II, IV, and V) NOT DETECTED NOT DETECTED Final    Comment: Performed at [facility], [place], [place] [address]  C Difficile Quick Screen w PCR reflex     Status: None   Collection Time: 12/03/18 12:03 PM   Specimen: Stool  Result Value Ref Range Status   C Diff antigen NEGATIVE NEGATIVE Final   C Diff toxin NEGATIVE NEGATIVE Final   C Diff interpretation No C. difficile detected.  Final    Comment: Performed at [facility], [address], [place], [place] [address]     Labs: BNP (last 3 results) Recent Labs    05/21/18 1338  BNP [id number]   Basic Metabolic Panel: Recent Labs  Lab 12/02/18 0859 12/03/18 0617 12/04/18 0628 12/05/18 0618  NA 139 137 140 139  K 4.5 3.9 3.7 3.9  CL 104 107 110 109  CO2 24 21* 21* 21*   GLUCOSE 106* 88 84 83  BUN 18 16 12 11   CREATININE 1.38* 1.14* 1.09* 1.02*  CALCIUM 9.4 8.3* 8.2* 8.4*  MG  --  2.2 2.2 2.0   Liver Function Tests: Recent Labs  Lab 12/02/18 0859 12/03/18 0617 12/04/18 0628 12/05/18 0618  AST 25 17 15 17   ALT 25 17 14 15   ALKPHOS 74 59 53 51  BILITOT 0.6 0.9 0.3 0.7  PROT 8.0 6.4* 6.1* 6.3*  ALBUMIN 4.0 3.2* 3.1* 3.1*   No results for input(s): LIPASE, AMYLASE in the last 168 hours. No results for input(s): AMMONIA in the last 168 hours. CBC: Recent Labs  Lab 12/02/18 0859 12/03/18 0617 12/04/18 0628 12/05/18 0618  WBC 9.3 8.1 5.8 6.5  NEUTROABS 6.1 4.7 2.9 3.9  HGB 12.7 11.0* 10.5* 10.6*  HCT 42.0 34.8* 34.6* 33.5*  MCV 97.0 95.3 96.1 95.2  PLT 297 237 220 233   Cardiac Enzymes: No results for input(s): CKTOTAL, CKMB, CKMBINDEX, TROPONINI in the last 168 hours. BNP: Invalid input(s): POCBNP CBG: No results for input(s): GLUCAP in the last 168 hours. D-Dimer No results for input(s): DDIMER in the last 72 hours. Hgb A1c No results for input(s): HGBA1C in the last 72 hours. Lipid Profile No results for input(s): CHOL, HDL, LDLCALC, TRIG, CHOLHDL, LDLDIRECT in the last 72 hours. Thyroid function studies No results for input(s): TSH, T4TOTAL, T3FREE, THYROIDAB in the last 72 hours.  Invalid input(s): FREET3 Anemia work up No results for input(s): VITAMINB12, FOLATE, FERRITIN, TIBC, IRON, RETICCTPCT in the last 72 hours. Urinalysis    Component Value Date/Time   COLORURINE STRAW (A) 12/02/2018 1402   APPEARANCEUR CLEAR 12/02/2018 1402   LABSPEC 1.027 12/02/2018 1402   PHURINE 6.0 12/02/2018 1402   GLUCOSEU NEGATIVE 12/02/2018 1402   HGBUR NEGATIVE 12/02/2018 1402   BILIRUBINUR NEGATIVE 12/02/2018 1402   KETONESUR NEGATIVE 12/02/2018 1402   PROTEINUR NEGATIVE 12/02/2018 1402   UROBILINOGEN 0.2 09/07/2006 1322   NITRITE NEGATIVE 12/02/2018 1402   LEUKOCYTESUR MODERATE (A) 12/02/2018 1402   Sepsis Labs Invalid  input(s): PROCALCITONIN,  WBC,  LACTICIDVEN Microbiology Recent Results (from the past 240 hour(s))  SARS CORONAVIRUS 2 (TAT 6-24 HRS) Nasopharyngeal Nasopharyngeal Swab     Status: None   Collection Time: 12/02/18 11:11 AM   Specimen: Nasopharyngeal Swab  Result Value Ref Range Status   SARS Coronavirus 2 NEGATIVE NEGATIVE Final    Comment: (NOTE) SARS-CoV-2 target nucleic acids are NOT DETECTED. The SARS-CoV-2 RNA is generally detectable in upper and  lower respiratory specimens during the acute phase of infection. Negative results do not preclude SARS-CoV-2 infection, do not rule out co-infections with other pathogens, and should not be used as the sole basis for treatment or other patient management decisions. Negative results must be combined with clinical observations, patient history, and epidemiological information. The expected result is Negative. Fact Sheet for Patients: SugarRoll.be Fact Sheet for Healthcare Providers: https://www.woods-mathews.com/ This test is not yet approved or cleared by the [place] FDA and  has been authorized for detection and/or diagnosis of SARS-CoV-2 by FDA under an Emergency Use Authorization (EUA). This EUA will remain  in effect (meaning this test can be used) for the duration of the COVID-19 declaration under Section 56 4(b)(1) of the Act, 21 U.S.C. section 360bbb-3(b)(1), unless the authorization is terminated or revoked sooner. Performed at [facility], [place] [address] [place], [place] [address]   Gastrointestinal Panel by PCR , Stool     Status: None   Collection Time: 12/03/18 12:03 PM   Specimen: Stool  Result Value Ref Range Status   Campylobacter species NOT DETECTED NOT DETECTED Final   Plesimonas shigelloides NOT DETECTED NOT DETECTED Final   Salmonella species NOT DETECTED NOT DETECTED Final   Yersinia enterocolitica NOT DETECTED NOT DETECTED Final   Vibrio species NOT  DETECTED NOT DETECTED Final   Vibrio cholerae NOT DETECTED NOT DETECTED Final   Enteroaggregative E coli (EAEC) NOT DETECTED NOT DETECTED Final   Enteropathogenic E coli (EPEC) NOT DETECTED NOT DETECTED Final   Enterotoxigenic E coli (ETEC) NOT DETECTED NOT DETECTED Final   Shiga like toxin producing E coli (STEC) NOT DETECTED NOT DETECTED Final   Shigella/Enteroinvasive E coli (EIEC) NOT DETECTED NOT DETECTED Final   Cryptosporidium NOT DETECTED NOT DETECTED Final   Cyclospora cayetanensis NOT DETECTED NOT DETECTED Final   Entamoeba histolytica NOT DETECTED NOT DETECTED Final   Giardia lamblia NOT DETECTED NOT DETECTED Final   Adenovirus F40/41 NOT DETECTED NOT DETECTED Final   Astrovirus NOT DETECTED NOT DETECTED Final   Norovirus GI/GII NOT DETECTED NOT DETECTED Final   Rotavirus A NOT DETECTED NOT DETECTED Final   Sapovirus (I, II, IV, and V) NOT DETECTED NOT DETECTED Final    Comment: Performed at [facility], [address] [place], [place] [address]  C Difficile Quick Screen w PCR reflex     Status: None   Collection Time: 12/03/18 12:03 PM   Specimen: Stool  Result Value Ref Range Status   C Diff antigen NEGATIVE NEGATIVE Final   C Diff toxin NEGATIVE NEGATIVE Final   C Diff interpretation No C. difficile detected.  Final    Comment: Performed at [facility], [address], [place], [place] [address]   Time coordinating discharge:  34 minutes   SIGNED:  Irwin Brakeman, MD  [facility] 12/05/2018, 9:54 AM How to contact the [facility] Attending or Consulting provider [place] or covering provider during after hours [place], for this patient?  1. Check the care team in [facility] and look for a) attending/consulting [facility] provider listed and b) the [facility] team listed 2. Log into [email or web address] and use [place]'s universal password to access. If you do not have the password, please contact the hospital operator. 3. Locate the [facility] provider you are looking for under [facility] and page to a number that you can be directly reached. 4. If you still have difficulty reaching the provider, please page the [facility] (Director on Call) for  the Hospitalists listed on amion for assistance.

## 2018-12-05 NOTE — Discharge Instructions (Signed)
Soft food diet recommended for at least 1 week Please follow up with surgeon as soon as possible Follow up with your gastroenterologist in 1 week   Diverticulitis  Diverticulitis is when small pockets in your large intestine (colon) get infected or swollen. This causes stomach pain and watery poop (diarrhea). These pouches are called diverticula. They form in people who have a condition called diverticulosis. Follow these instructions at home: Medicines  Take over-the-counter and prescription medicines only as told by your doctor. These include: ? Antibiotics. ? Pain medicines. ? Fiber pills. ? Probiotics. ? Stool softeners.  Do not drive or use heavy machinery while taking prescription pain medicine.  If you were prescribed an antibiotic, take it as told. Do not stop taking it even if you feel better. General instructions   Follow a diet as told by your doctor.  When you feel better, your doctor may tell you to change your diet. You may need to eat a lot of fiber. Fiber makes it easier to poop (have bowel movements). Healthy foods with fiber include: ? Berries. ? Beans. ? Lentils. ? Green vegetables.  Exercise 3 or more times a week. Aim for 30 minutes each time. Exercise enough to sweat and make your heart beat faster.  Keep all follow-up visits as told. This is important. You may need to have an exam of the large intestine. This is called a colonoscopy. Contact a doctor if:  Your pain does not get better.  You have a hard time eating or drinking.  You are not pooping like normal. Get help right away if:  Your pain gets worse.  Your problems do not get better.  Your problems get worse very fast.  You have a fever.  You throw up (vomit) more than one time.  You have poop that is: ? Bloody. ? Black. ? Tarry. Summary  Diverticulitis is when small pockets in your large intestine (colon) get infected or swollen.  Take medicines only as told by your  doctor.  Follow a diet as told by your doctor. This information is not intended to replace advice given to you by your health care provider. Make sure you discuss any questions you have with your health care provider. Document Released: 07/09/2007 Document Revised: 01/02/2017 Document Reviewed: 02/07/2016 Elsevier Patient Education  2020 [employer] A soft-food eating plan includes foods that are safe and easy to chew and swallow. Your health care provider or dietitian can help you find foods and flavors that fit into this plan. Follow this plan until your health care provider or dietitian says it is safe to start eating other foods and food textures. What are tips for following this plan? General guidelines   Take small bites of food, or cut food into pieces about  inch or smaller. Bite-sized pieces of food are easier to chew and swallow.  Eat moist foods. Avoid overly dry foods.  Avoid foods that: ? Are difficult to swallow, such as dry, chunky, crispy, or sticky foods. ? Are difficult to chew, such as hard, tough, or stringy foods. ? Contain nuts, seeds, or fruits.  Follow instructions from your dietitian about the types of liquids that are safe for you to swallow. You may be allowed to have: ? Thick liquids only. This includes only liquids that are thicker than honey. ? Thin and thick liquids. This includes all beverages and foods that become liquid at room temperature.  To make thick liquids: ? Purchase  a commercial liquid thickening powder. These are available at grocery stores and pharmacies. ? Mix the thickener into liquids according to instructions on the label. ? Purchase ready-made thickened liquids. ? Thicken soup by pureeing, straining to remove chunks, and adding flour, potato flakes, or corn starch. ? Add commercial thickener to foods that become liquid at room temperature, such as milk shakes, yogurt, ice cream, gelatin, and  sherbet.  Ask your health care provider whether you need to take a fiber supplement. Cooking  Cook meats so they stay tender and moist. Use methods like braising, stewing, or baking in liquid.  Cook vegetables and fruit until they are soft enough to be mashed with a fork.  Peel soft, fresh fruits such as peaches, nectarines, and melons.  When making soup, make sure chunks of meat and vegetables are smaller than  inch.  Reheat leftover foods slowly so that a tough crust does not form. What foods are allowed? The items listed below may not be a complete list. Talk with your dietitian about what dietary choices are best for you. Grains Breads, muffins, pancakes, or waffles moistened with syrup, jelly, or butter. Dry cereals well-moistened with milk. Moist, cooked cereals. Well-cooked pasta and rice. Vegetables All soft-cooked vegetables. Shredded lettuce. Fruits All canned and cooked fruits. Soft, peeled fresh fruits. Strawberries. Dairy Milk. Cream. Yogurt. Cottage cheese. Soft cheese without the rind. Meats and other protein foods Tender, moist ground meat, poultry, or fish. Meat cooked in gravy or sauces. Eggs. Sweets and desserts Ice cream. Milk shakes. Sherbet. Pudding. Fats and oils Butter. Margarine. Olive, canola, sunflower, and grapeseed oil. Smooth salad dressing. Smooth cream cheese. Mayonnaise. Gravy. What foods are not allowed? The items listed bemay not be a complete list. Talk with your dietitian about what dietary choices are best for you. Grains Coarse or dry cereals, such as bran, granola, and shredded wheat. Tough or chewy crusty breads, such as [place] bread or baguettes. Breads with nuts, seeds, or fruit. Vegetables All raw vegetables. Cooked corn. Cooked vegetables that are tough or stringy. Tough, crisp, fried potatoes and potato skins. Fruits Fresh fruits with skins or seeds, or both, such as apples, pears, and grapes. Stringy, high-pulp fruits, such as  papaya, pineapple, coconut, and mango. Fruit leather and all dried fruit. Dairy Yogurt with nuts or coconut. Meats and other protein foods Hard, dry sausages. Dry meat, poultry, or fish. Meats with gristle. Fish with bones. Fried meat or fish. Lunch meat and hotdogs. Nuts and seeds. Chunky peanut butter or other nut butters. Sweets and desserts Cakes or cookies that are very dry or chewy. Desserts with dried fruit, nuts, or coconut. Fried pastries. Very rich pastries. Fats and oils Cream cheese with fruit or nuts. Salad dressings with seeds or chunks. Summary  A soft-food eating plan includes foods that are safe and easy to swallow. Generally, the foods should be soft enough to be mashed with a fork.  Avoid foods that are dry, hard to chew, crunchy, sticky, stringy, or crispy.  Ask your health care provider whether you need to thicken your liquids and if you need to take a fiber supplement. This information is not intended to replace advice given to you by your health care provider. Make sure you discuss any questions you have with your health care provider. Document Released: 04/29/2007 Document Revised: 05/13/2018 Document Reviewed: 03/25/2016 Elsevier Patient Education  2020 [name].

## 2018-12-07 NOTE — Progress Notes (Signed)
Referring Provider: Celene Squibb, MD Primary Care Physician:  Celene Squibb, MD Primary GI Physician: Dr. Gala Torres  Chief Complaint  Patient presents with  . Hospitalization Follow-up    having loose stool; pain is better    HPI:   Diane Torres is a 78 y.o. female presenting today with a GI history of 4 episodes of CT documented diverticulitis, GERD, esophagitis, and dysphagia with EGD in 2018 revealingLA Grade A esophagitis s/p dilationwho presents today for diverticulitis follow-up.   I last saw her on 11/24/2018 for recurrent LLQ pain that started in September 2020.  She had recently completed a 7-day course of Cipro 500 twice daily and Flagyl 500 mg twice daily with last dose on 11/09/2018.  Symptoms had initially improved; however, LLQ pain returned on 11/20/2018.  She denied BRBPR, melena, nausea, vomiting, fever, or chills.  Ordered stat CT which revealed focal diverticulitis at the junction of the descending and sigmoid colon without abscess or perforation.  Patient was started on Augmentin 875/125 3 times daily x10 days as she had just completed a round of Cipro and Flagyl.  Follow-up phone calls with patient having minimal improvement after 8 days of antibiotics.  I recommended patient proceed to the emergency department to be admitted for IV antibiotics.  She presented to the ED on 12/02/2018.  Repeat CT with persistent diverticulitis without abscess.  CBC within normal limits.  CMP stable with CKD.  GI pathogen panel and C. difficile were checked due to diarrhea.  These were negative.  She was treated with IV Cipro and Flagyl as well as IV fluids.  Throughout her hospital course, she responded well to IV antibiotics and was tolerating a soft diet by discharge on 12/05/2018.  She was transitioned to oral Flagyl 500 mg twice daily and oral cefdinir 300 mg twice daily x3 days.  Last colonoscopy in November 2017 with one rectal polyp (tubular adenoma), diverticulosis throughout the entire  colon, and internal hemorrhoids.  Today she states she is doing better. Severe LLQ pain has resolved but still has some soreness. Today is her last day of antibiotics. She is having loose stools. Also with some seepage at times. Has known hemorrhoids. With diarrhea, will have some rectal burning or itching. None right now. No rectal bleeding. States hemorrhoids are prolapsed all the time. BMs daily. 1-3. Stools are mushy. Not watery. Not necessarily after eating. States she doesn't remember the last time she has had a formed BM.  Stools have been loose/mushy since the onset of diverticulitis in September. No cheese, milk, or icecream since having diverticulitis. Stools have improved some as her pain is improving. She was having 5-6 BMs initially. Woke up 2 night ago to have 2 BMs.  Reports loose stools are always present when she has diverticulitis.    No fever or chills. Will have nausea with the antibiotics now if she doesn't eat. No GERD symptoms. She has lost about 5 lbs since last visit. Still eating soft foods. Tolerating this well.   Has appointment tomorrow with surgery.   Past Medical History:  Diagnosis Date  . CAP (community acquired pneumonia) 05/2018  . Diverticulitis    CT verified in 2015, 2018, and 2019  . GERD (gastroesophageal reflux disease)   . Hypothyroidism   . Impaired fasting glucose   . Mixed hyperlipidemia     Past Surgical History:  Procedure Laterality Date  . COLONOSCOPY    . COLONOSCOPY N/A 12/12/2015   Dr. Gala Torres: one  3 mm tubular adenoma in rectum s/p removal. Diverticulosis in entire colon. Internal hemorrhoids.   . ESOPHAGOGASTRODUODENOSCOPY N/A 03/19/2016   Dr. Gala Torres: LA Grade A esophagitis, empiric dilation, normal stomach, normal duodenum  . Left lobe thyroidectomy    . MALONEY DILATION N/A 03/19/2016   Procedure: Diane Torres DILATION;  Surgeon: Diane Dolin, MD;  Location: [facility];  Service: Endoscopy;  Laterality: N/A;  . POLYPECTOMY  12/12/2015    Procedure: POLYPECTOMY;  Surgeon: Diane Dolin, MD;  Location: [facility];  Service: Endoscopy;;  colon  . RECTOCELE REPAIR    . TOTAL ABDOMINAL HYSTERECTOMY W/ BILATERAL SALPINGOOPHORECTOMY      Current Outpatient Medications  Medication Sig Dispense Refill  . acetaminophen (TYLENOL) 325 MG tablet Take 650 mg by mouth every 6 (six) hours as needed.    . cefdinir (OMNICEF) 300 MG capsule Take 1 capsule (300 mg total) by mouth 2 (two) times daily for 3 days. 6 capsule 0  . fluconazole (DIFLUCAN) 150 MG tablet Take 1 tablet (150 mg total) by mouth daily as needed (yeast infection). Repeat if needed 2 tablet 0  . loratadine (CLARITIN) 10 MG tablet Take 10 mg by mouth every morning.     Marland Kitchen losartan (COZAAR) 100 MG tablet Take 100 mg by mouth daily.    . metroNIDAZOLE (FLAGYL) 500 MG tablet Take 1 tablet (500 mg total) by mouth 2 (two) times daily for 3 days. 6 tablet 0  . pantoprazole (PROTONIX) 40 MG tablet TAKE ONE TABLET BY MOUTH ONCE DAILY. 30 tablet 11  . simvastatin (ZOCOR) 40 MG tablet Take 40 mg by mouth at bedtime.      No current facility-administered medications for this visit.     Allergies as of 12/08/2018 - Review Complete 12/08/2018  Allergen Reaction Noted  . Bee venom Anaphylaxis 03/18/2016    Family History  Problem Relation Age of Onset  . CAD Brother        2 brothers with CAD in their 26s  . Crohn's disease Brother        had colon resection  . Pancreatic cancer Brother   . CAD Father   . Heart failure Mother   . Diabetes Mellitus II Mother   . Colon cancer Neg Hx     Social History   Socioeconomic History  . Marital status: Married    Spouse name: Not on file  . Number of children: Not on file  . Years of education: Not on file  . Highest education level: Not on file  Occupational History  . Not on file  Social Needs  . Financial resource strain: Not on file  . Food insecurity    Worry: Not on file    Inability: Not on file  . Transportation  needs    Medical: Not on file    Non-medical: Not on file  Tobacco Use  . Smoking status: Never Smoker  . Smokeless tobacco: Never Used  Substance and Sexual Activity  . Alcohol use: No  . Drug use: No  . Sexual activity: Not on file  Lifestyle  . Physical activity    Days per week: Not on file    Minutes per session: Not on file  . Stress: Not on file  Relationships  . Social Herbalist on phone: Not on file    Gets together: Not on file    Attends religious service: Not on file    Active member of club or organization:  Not on file    Attends meetings of clubs or organizations: Not on file    Relationship status: Not on file  Other Topics Concern  . Not on file  Social History Narrative  . Not on file    Review of Systems: Gen: Denies lightheadedness, dizziness, or feeling like she will pass out. CV: Denies chest pain, palpitations Resp: Denies dyspnea at rest, cough GI: See HPI Derm: Denies rash, itching, dry skin Heme: Denies bruising, bleeding  Physical Exam: BP (!) 155/71   Pulse 80   Temp (!) 96.2 F (35.7 C) (Temporal)   Ht 5\' 1"  (1.549 m)   Wt 162 lb (73.5 kg)   BMI 30.61 kg/m  General:   Alert and oriented. No distress noted. Pleasant and cooperative.  Head:  Normocephalic and atraumatic. Eyes:  Conjuctiva clear without scleral icterus. Heart:  S1, S2 present without murmurs appreciated. Lungs:  Clear to auscultation bilaterally. No wheezes, rales, or rhonchi. No distress.  Abdomen:  +BS, soft, non-distended. Remains moderately to significantly tender to moderate palpation in the LLQ. No rebound or guarding. No masses noted.   Msk:  Symmetrical without gross deformities. Normal posture. Extremities:  Without edema. Neurologic:  Alert and  oriented x4 Psych: Normal mood and affect.

## 2018-12-08 ENCOUNTER — Encounter: Payer: Self-pay | Admitting: Gastroenterology

## 2018-12-08 ENCOUNTER — Other Ambulatory Visit: Payer: Self-pay

## 2018-12-08 ENCOUNTER — Ambulatory Visit (INDEPENDENT_AMBULATORY_CARE_PROVIDER_SITE_OTHER): Payer: Medicare Other | Admitting: Gastroenterology

## 2018-12-08 VITALS — BP 155/71 | HR 80 | Temp 96.2°F | Ht 61.0 in | Wt 162.0 lb

## 2018-12-08 DIAGNOSIS — K5792 Diverticulitis of intestine, part unspecified, without perforation or abscess without bleeding: Secondary | ICD-10-CM | POA: Diagnosis not present

## 2018-12-08 NOTE — Patient Instructions (Signed)
Complete your antibiotics as prescribed.   Start daily probiotic. [employer] colon health, or digestive advantage are good choices.   Keep appointment with surgery tomorrow. Hopefully they will have further recommendations regarding this persiistent diverticulitis. We will follow-up with you tomorrow afternoon to see how this went and what their recommendations were. We will also get the notes from them as they become available.   We will have further recommendations following your surgery consult. If your antibiotics are not extended by surgery and your abdominal pain worsens, you should call us immediately.   Aliene Altes, PA-C [facility]

## 2018-12-09 ENCOUNTER — Telehealth: Payer: Self-pay

## 2018-12-09 DIAGNOSIS — K5792 Diverticulitis of intestine, part unspecified, without perforation or abscess without bleeding: Secondary | ICD-10-CM | POA: Diagnosis not present

## 2018-12-09 NOTE — Telephone Encounter (Signed)
Called pt to check on her after her apt with Dr. Jacques Navy at [facility] today per [facility]. Pt states the appointment went better than she expected. Pt was told that surgery isn't necessary at this time. If the doctor were to go in and remove the bad area, it could show back up in another area. Pt was advised to eat Activa yogurt each morning and take probiotics. Pt explained to the doctor that the probiotics run right through her and pt was told that's exactly what it's suppose to do per Dr. Sabra Heck. Pt is going to f/u in 6 weeks to see how things are doing and to see if surgery is an option then.

## 2018-12-10 ENCOUNTER — Encounter: Payer: Self-pay | Admitting: Gastroenterology

## 2018-12-10 NOTE — Telephone Encounter (Signed)
Noted. Should she have worsening pain in the mean time, I would advise she call surgery back as she has now been on 3 rounds of antibiotics and I am not sure a 4th round, especially outpatient, would be beneficial. We are always available for her to call, but as surgery is now following closely, I would like for her to stay in close contact with them should her symptoms change prior to her follow-up.

## 2018-12-10 NOTE — Assessment & Plan Note (Addendum)
78 year old female with history of 4 CT documented episodes of diverticulitis, most recently in October 2020.  She presents today for follow-up of current episode of diverticulitis.  She was initially treated in late September with Cipro and Flagyl with last dose of antibiotics on 11/09/2018.  She initially improved but had return of left lower quadrant pain on 11/20/2018.  CT was ordered and revealed focal diverticulitis at the junction of the descending and sigmoid colon without abscess or perforation.  She was started on Augmentin x10 days.  By day 8, patient had minimal improvement and was advised to proceed to the emergency department for IV antibiotics.  Repeat CT with persistent uncomplicated diverticulitis.  She received 4 days of IV antibiotics and was discharged on 11/1 with 3 additional days of cefdinir and Flagyl.  Today is her last day of antibiotics and patient feels she is improving.  However, she continues to have loose stools and remains moderately to significantly tender to moderate palpation in the LLQ.  C. difficile and GI pathogen panel in the hospital were negative.  Denies fever, chills, nausea (unless she does not eat with antibiotics), vomiting, BRBPR, or melena.  Patient has first appointment with surgery tomorrow regarding recurrent and persistent diverticulitis.  She has now completed 3 rounds of antibiotics and I do not feel a fourth would be beneficial.  We are unable to proceed with colonoscopy at this time as patients symptoms have not resolved.  I suspect her diarrhea may be related to all of the antibiotics she has taking as well as the underlying diverticulitis.  Complete oral antibiotics as prescribed. I advise she keep her appointment with surgery and follow their recommendations. Start daily probiotic (align, [employer] colon health, or digestive advantage) Discussed with Dr. Gala Romney who agrees with the above plan.

## 2018-12-13 NOTE — Telephone Encounter (Signed)
Noted. Spoke with pt. Pt was notified of [facility]'s recommendations.

## 2018-12-29 ENCOUNTER — Ambulatory Visit: Payer: Medicare Other | Admitting: Gastroenterology

## 2019-02-23 DIAGNOSIS — S39012A Strain of muscle, fascia and tendon of lower back, initial encounter: Secondary | ICD-10-CM | POA: Diagnosis not present

## 2019-04-29 DIAGNOSIS — R7301 Impaired fasting glucose: Secondary | ICD-10-CM | POA: Diagnosis not present

## 2019-04-29 DIAGNOSIS — E785 Hyperlipidemia, unspecified: Secondary | ICD-10-CM | POA: Diagnosis not present

## 2019-04-29 DIAGNOSIS — I10 Essential (primary) hypertension: Secondary | ICD-10-CM | POA: Diagnosis not present

## 2019-05-04 DIAGNOSIS — E669 Obesity, unspecified: Secondary | ICD-10-CM | POA: Diagnosis not present

## 2019-05-04 DIAGNOSIS — N183 Chronic kidney disease, stage 3 unspecified: Secondary | ICD-10-CM | POA: Diagnosis not present

## 2019-05-04 DIAGNOSIS — K219 Gastro-esophageal reflux disease without esophagitis: Secondary | ICD-10-CM | POA: Diagnosis not present

## 2019-05-04 DIAGNOSIS — K5792 Diverticulitis of intestine, part unspecified, without perforation or abscess without bleeding: Secondary | ICD-10-CM | POA: Diagnosis not present

## 2019-05-04 DIAGNOSIS — R7303 Prediabetes: Secondary | ICD-10-CM | POA: Diagnosis not present

## 2019-05-04 DIAGNOSIS — E785 Hyperlipidemia, unspecified: Secondary | ICD-10-CM | POA: Diagnosis not present

## 2019-05-04 DIAGNOSIS — J302 Other seasonal allergic rhinitis: Secondary | ICD-10-CM | POA: Diagnosis not present

## 2019-05-04 DIAGNOSIS — I10 Essential (primary) hypertension: Secondary | ICD-10-CM | POA: Diagnosis not present

## 2019-05-04 DIAGNOSIS — R5383 Other fatigue: Secondary | ICD-10-CM | POA: Diagnosis not present

## 2019-05-04 DIAGNOSIS — Z6832 Body mass index (BMI) 32.0-32.9, adult: Secondary | ICD-10-CM | POA: Diagnosis not present

## 2019-05-09 ENCOUNTER — Other Ambulatory Visit (HOSPITAL_COMMUNITY): Payer: Self-pay | Admitting: Internal Medicine

## 2019-05-09 DIAGNOSIS — Z1231 Encounter for screening mammogram for malignant neoplasm of breast: Secondary | ICD-10-CM

## 2019-05-12 ENCOUNTER — Ambulatory Visit (HOSPITAL_COMMUNITY)
Admission: RE | Admit: 2019-05-12 | Discharge: 2019-05-12 | Disposition: A | Discharge status: Home / Self Care | Payer: Medicare Other | Source: Ambulatory Visit | Attending: Internal Medicine | Admitting: Internal Medicine

## 2019-05-12 ENCOUNTER — Other Ambulatory Visit: Payer: Self-pay

## 2019-05-12 DIAGNOSIS — Z1231 Encounter for screening mammogram for malignant neoplasm of breast: Secondary | ICD-10-CM | POA: Insufficient documentation

## 2019-05-24 IMAGING — CT CT ABD-PELV W/ CM
2 of 5 series · 16 of 46 positions shown, 18 images · IV contrast (iopamidol)
Comparison: CT abdomen pelvis dated April 19, 2013.

CLINICAL DATA: Left lower quadrant pain for the past 2-3 weeks with
intermittent diarrhea and constipation.

EXAM:
CT ABDOMEN AND PELVIS WITH CONTRAST
TECHNIQUE: Multidetector CT imaging of the abdomen and pelvis was performed
using the standard protocol following bolus administration of
intravenous contrast.
CONTRAST:  30mL F49ZRK-ZMM IOPAMIDOL (F49ZRK-ZMM) INJECTION 61%,
80mL F49ZRK-ZMM IOPAMIDOL (F49ZRK-ZMM) INJECTION 61%

[Series 2: axial st · axial · 0.66mm/px · z∈[+977,+1387]mm · 13 of 94 slices shown, 15 images]
[im 6/94  soft-tissue]
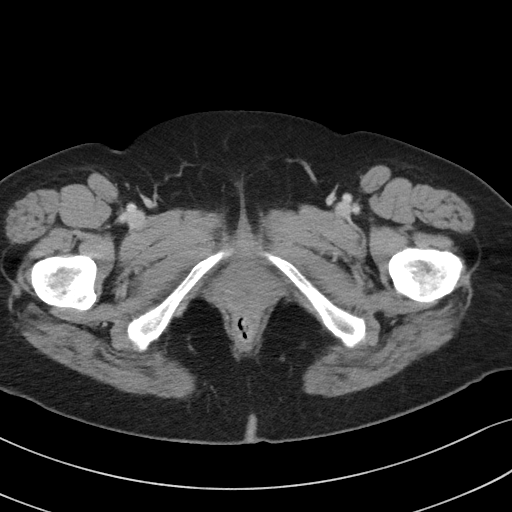
[im 6/94  bone]
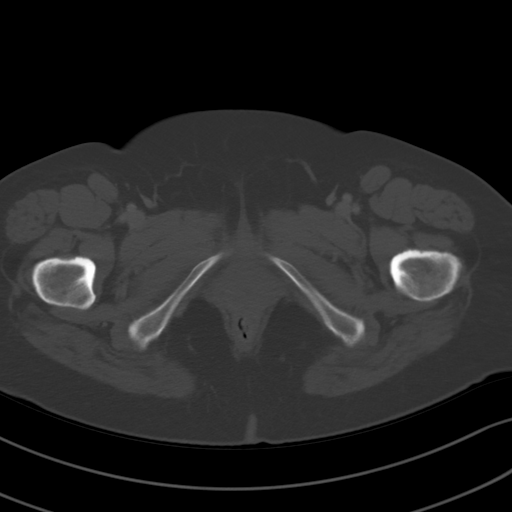
[im 12/94  soft-tissue]
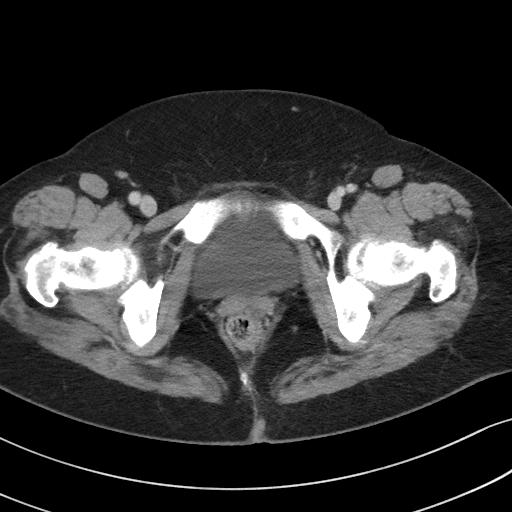
[im 18/94  soft-tissue]
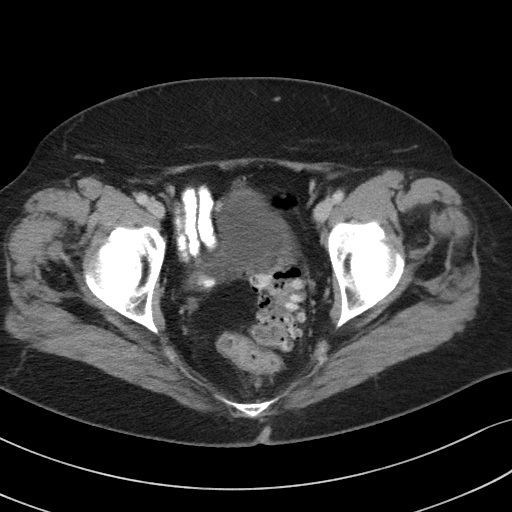
[im 30/94  soft-tissue]
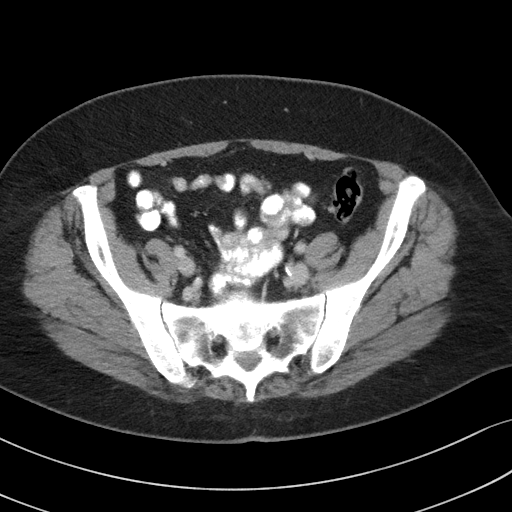
[im 35/94  soft-tissue]
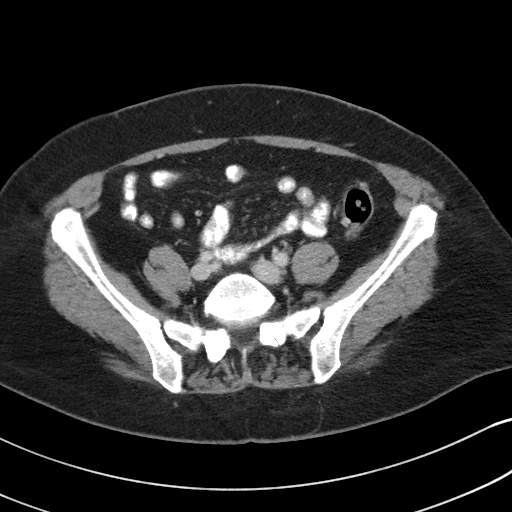
[im 41/94  soft-tissue]
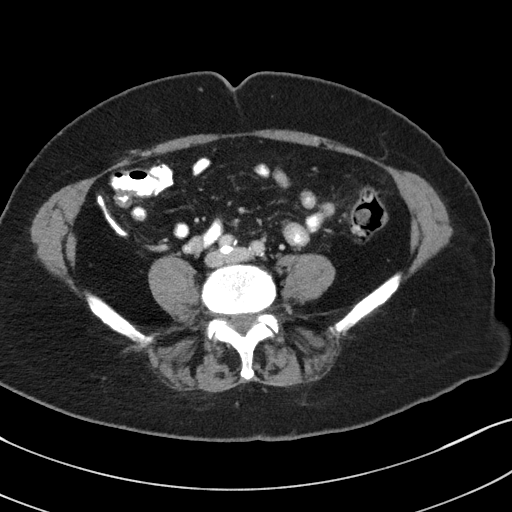
[im 47/94  soft-tissue]
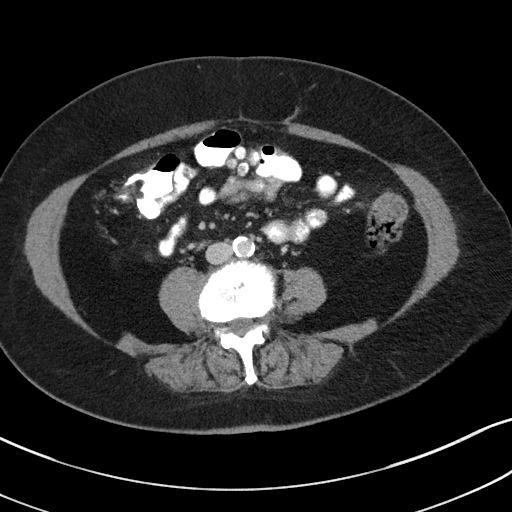
[im 53/94  soft-tissue]
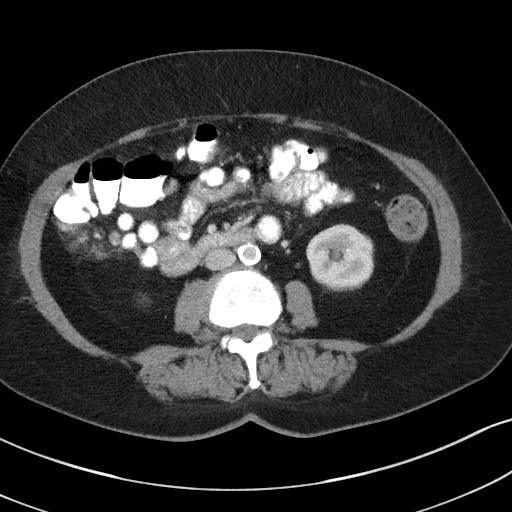
[im 59/94  soft-tissue]
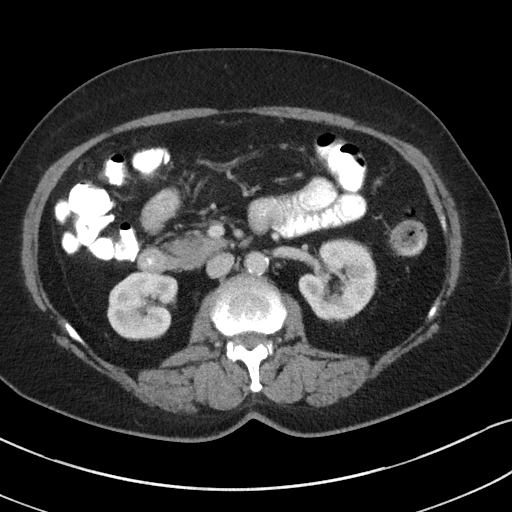
[im 59/94  bone]
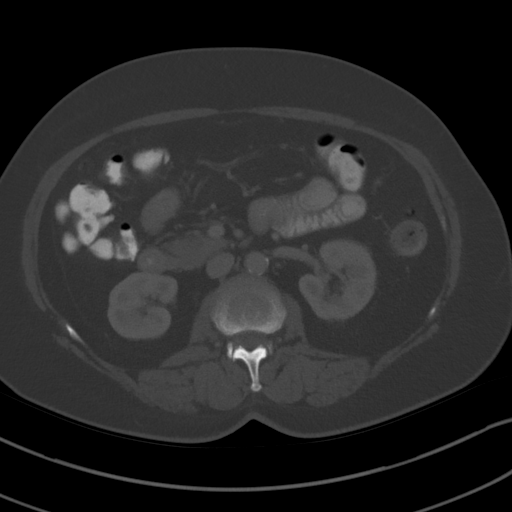
[im 64/94  soft-tissue]
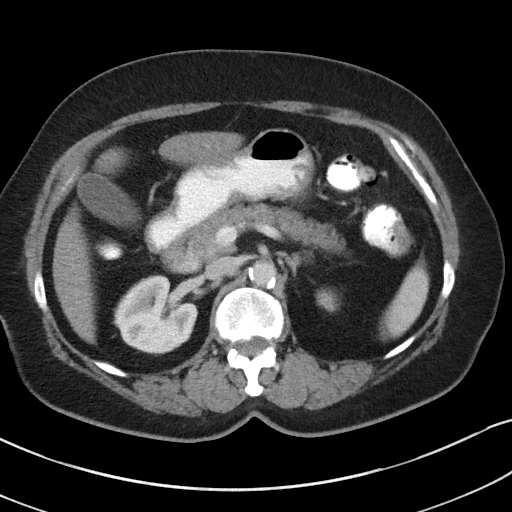
[im 76/94  soft-tissue]
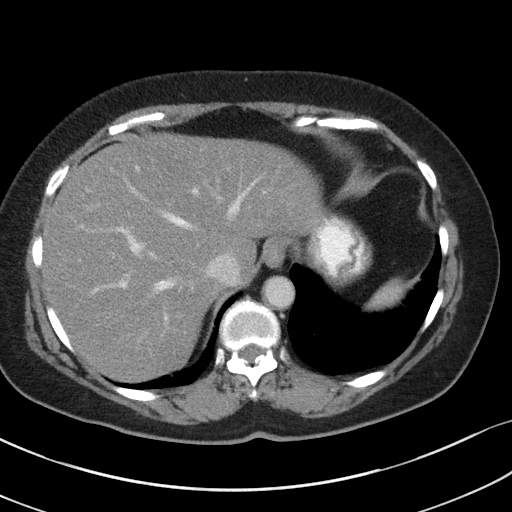
[im 82/94  soft-tissue]
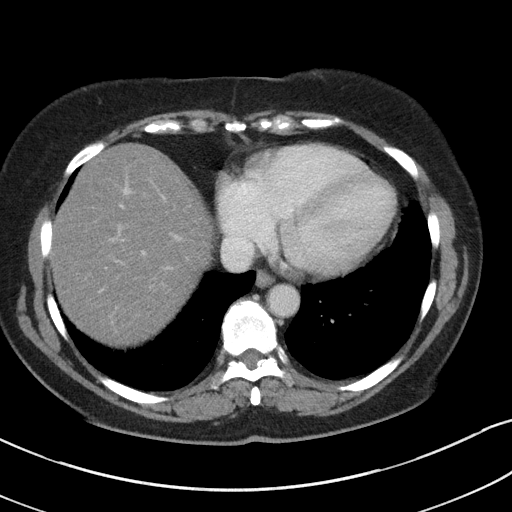
[im 88/94  soft-tissue]
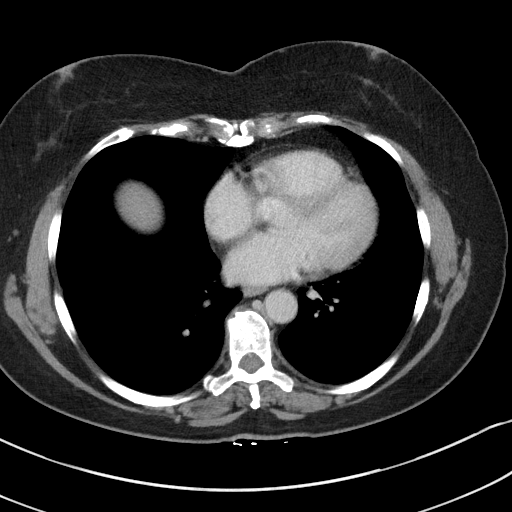

[Series 6: coronal st · coronal · 0.81mm/px · 3 of 94 slices shown]
[im 32/94  soft-tissue]
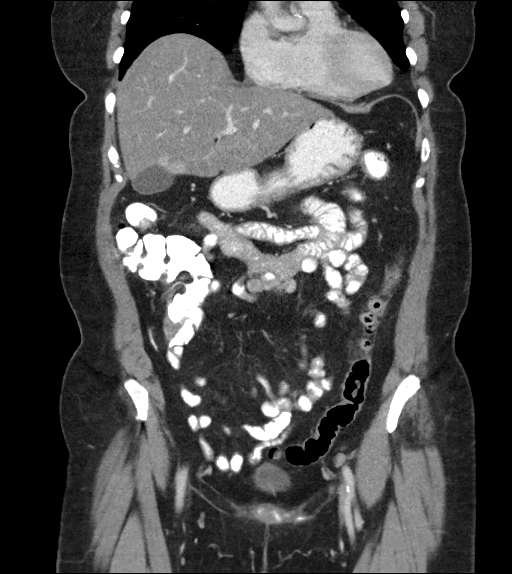
[im 42/94  soft-tissue]
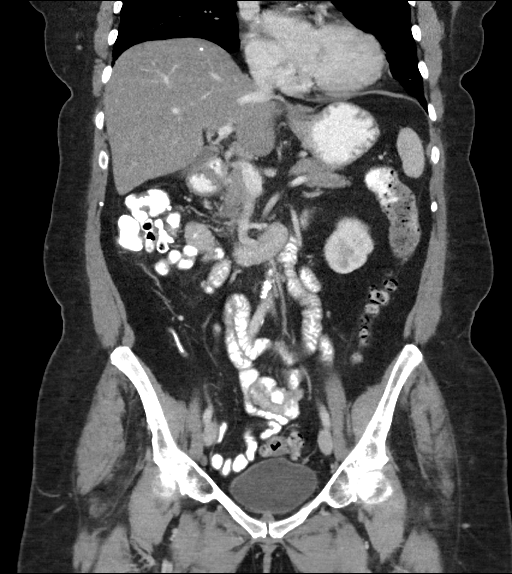
[im 52/94  soft-tissue]
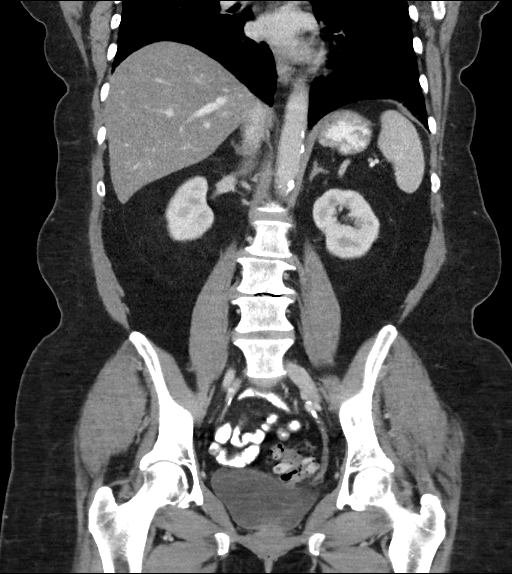

[16 of 46 positions shown; findings below may reference images not displayed]

FINDINGS: Lower chest: No acute abnormality.

Hepatobiliary: Diffuse hepatic steatosis. No focal liver abnormality
is seen. No gallstones, gallbladder wall thickening, or biliary
dilatation.

Pancreas: Unremarkable. No pancreatic ductal dilatation or
surrounding inflammatory changes.

Spleen: Normal in size without focal abnormality.

Adrenals/Urinary Tract: Adrenal glands are unremarkable. Kidneys are
normal, without renal calculi, focal lesion, or hydronephrosis.
Bladder is unremarkable.

Stomach/Bowel: Stomach is within normal limits. Appendix appears
normal. Extensive left-sided diverticulosis with mild inflammatory
stranding around the distal descending colon. No bowel wall
thickening or distention.

Vascular/Lymphatic: Aortic atherosclerosis. No enlarged abdominal or
pelvic lymph nodes.

Reproductive: Status post hysterectomy. No adnexal masses.

Other: No free fluid or pneumoperitoneum. No drainable fluid
collection.

Musculoskeletal: No acute or significant osseous findings.
IMPRESSION: 1. Acute diverticulitis involving the distal descending colon. No
drainable fluid collection or free air.
2.  Aortic atherosclerosis ([id number]).
3. Diffuse hepatic steatosis.

These results will be called to the ordering clinician or
representative by the [HOSPITAL] at the imaging location.

## 2019-06-16 DIAGNOSIS — H2513 Age-related nuclear cataract, bilateral: Secondary | ICD-10-CM | POA: Diagnosis not present

## 2019-10-20 ENCOUNTER — Other Ambulatory Visit: Payer: Self-pay

## 2019-10-20 ENCOUNTER — Emergency Department (HOSPITAL_COMMUNITY)
Admission: EM | Admit: 2019-10-20 | Discharge: 2019-10-20 | Disposition: A | Discharge status: Home / Self Care | Payer: Medicare Other | Attending: Emergency Medicine | Admitting: Emergency Medicine

## 2019-10-20 ENCOUNTER — Emergency Department (HOSPITAL_COMMUNITY): Payer: Medicare Other

## 2019-10-20 ENCOUNTER — Encounter (HOSPITAL_COMMUNITY): Payer: Self-pay

## 2019-10-20 DIAGNOSIS — U071 COVID-19: Secondary | ICD-10-CM | POA: Insufficient documentation

## 2019-10-20 DIAGNOSIS — J1282 Pneumonia due to coronavirus disease 2019: Secondary | ICD-10-CM | POA: Diagnosis not present

## 2019-10-20 DIAGNOSIS — R509 Fever, unspecified: Secondary | ICD-10-CM | POA: Diagnosis present

## 2019-10-20 DIAGNOSIS — E039 Hypothyroidism, unspecified: Secondary | ICD-10-CM | POA: Insufficient documentation

## 2019-10-20 DIAGNOSIS — Z79899 Other long term (current) drug therapy: Secondary | ICD-10-CM | POA: Insufficient documentation

## 2019-10-20 DIAGNOSIS — N183 Chronic kidney disease, stage 3 unspecified: Secondary | ICD-10-CM | POA: Diagnosis not present

## 2019-10-20 DIAGNOSIS — J9811 Atelectasis: Secondary | ICD-10-CM | POA: Diagnosis not present

## 2019-10-20 DIAGNOSIS — R0602 Shortness of breath: Secondary | ICD-10-CM | POA: Diagnosis not present

## 2019-10-20 LAB — CBC WITH DIFFERENTIAL/PLATELET
Abs Immature Granulocytes: 0.02 10*3/uL (ref 0.00–0.07)
Basophils Absolute: 0 10*3/uL (ref 0.0–0.1)
Basophils Relative: 1 %
Eosinophils Absolute: 0 10*3/uL (ref 0.0–0.5)
Eosinophils Relative: 0 %
HCT: 38.7 % (ref 36.0–46.0)
Hemoglobin: 12 g/dL (ref 12.0–15.0)
Immature Granulocytes: 0 %
Lymphocytes Relative: 21 %
Lymphs Abs: 1.5 10*3/uL (ref 0.7–4.0)
MCH: 29.4 pg (ref 26.0–34.0)
MCHC: 31 g/dL (ref 30.0–36.0)
MCV: 94.9 fL (ref 80.0–100.0)
Monocytes Absolute: 1 10*3/uL (ref 0.1–1.0)
Monocytes Relative: 14 %
Neutro Abs: 4.4 10*3/uL (ref 1.7–7.7)
Neutrophils Relative %: 64 %
Platelets: 252 10*3/uL (ref 150–400)
RBC: 4.08 MIL/uL (ref 3.87–5.11)
RDW: 14.6 % (ref 11.5–15.5)
WBC: 7 10*3/uL (ref 4.0–10.5)
nRBC: 0 % (ref 0.0–0.2)

## 2019-10-20 LAB — COMPREHENSIVE METABOLIC PANEL
ALT: 29 U/L (ref 0–44)
AST: 27 U/L (ref 15–41)
Albumin: 3.7 g/dL (ref 3.5–5.0)
Alkaline Phosphatase: 54 U/L (ref 38–126)
Anion gap: 11 (ref 5–15)
BUN: 17 mg/dL (ref 8–23)
CO2: 24 mmol/L (ref 22–32)
Calcium: 9 mg/dL (ref 8.9–10.3)
Chloride: 98 mmol/L (ref 98–111)
Creatinine, Ser: 1.27 mg/dL — ABNORMAL HIGH (ref 0.44–1.00)
GFR calc Af Amer: 46 mL/min — ABNORMAL LOW (ref >60)
GFR calc non Af Amer: 40 mL/min — ABNORMAL LOW (ref >60)
Glucose, Bld: 101 mg/dL — ABNORMAL HIGH (ref 70–99)
Potassium: 4.5 mmol/L (ref 3.5–5.1)
Sodium: 133 mmol/L — ABNORMAL LOW (ref 135–145)
Total Bilirubin: 0.5 mg/dL (ref 0.3–1.2)
Total Protein: 7.5 g/dL (ref 6.5–8.1)

## 2019-10-20 LAB — SARS CORONAVIRUS 2 BY RT PCR (HOSPITAL ORDER, PERFORMED IN ~~LOC~~ HOSPITAL LAB): SARS Coronavirus 2: POSITIVE — AB

## 2019-10-20 MED ORDER — ACETAMINOPHEN 325 MG PO TABS
650.0000 mg | ORAL_TABLET | Freq: Once | ORAL | Status: AC
  Administered 2019-10-20: 650 mg via ORAL
  Filled 2019-10-20: qty 2

## 2019-10-20 NOTE — ED Triage Notes (Signed)
Pt started having a headache, fever, cough, and vomiting since Tuesday. Pt got first COVID vaccine 2 weeks ago.

## 2019-10-20 NOTE — ED Provider Notes (Signed)
[facility] EMERGENCY DEPARTMENT Provider Note   CSN: [id number] Arrival date & time: 10/20/19  0102     History Chief Complaint  Patient presents with  . Fever    Diane Torres is a 79 y.o. female.  HPI She presents for evaluation of fever, chills, cough, vomiting for the last 36 hours.  She had her first Covid injection of 2, about 2 weeks ago.  No known sick contacts.  She denies shortness of breath, chest pain, focal weakness or paresthesia.  There are no other known modifying factors.    Past Medical History:  Diagnosis Date  . CAP (community acquired pneumonia) 05/2018  . Diverticulitis    CT verified in 2015, 2018, 2019, 2020  . GERD (gastroesophageal reflux disease)   . Hypothyroidism   . Impaired fasting glucose   . Mixed hyperlipidemia     Patient Active Problem List   Diagnosis Date Noted  . Diarrhea 12/02/2018  . Diverticulosis 12/02/2018  . Acute diverticulitis 06/30/2017  . Rectal bleeding 06/30/2017  . CKD (chronic kidney disease) stage 3, GFR 30-59 ml/min 06/30/2017  . LLQ pain 12/23/2016  . GERD (gastroesophageal reflux disease) 06/16/2016  . Gastroesophageal reflux disease with esophagitis 06/16/2016  . Precordial pain 03/10/2013  . Impaired fasting glucose 03/09/2013  . Mixed hyperlipidemia 03/09/2013  . Hypothyroidism 03/09/2013    Past Surgical History:  Procedure Laterality Date  . COLONOSCOPY    . COLONOSCOPY N/A 12/12/2015   Dr. Gala Romney: one 3 mm tubular adenoma in rectum s/p removal. Diverticulosis in entire colon. Internal hemorrhoids.   . ESOPHAGOGASTRODUODENOSCOPY N/A 03/19/2016   Dr. Gala Romney: LA Grade A esophagitis, empiric dilation, normal stomach, normal duodenum  . Left lobe thyroidectomy    . MALONEY DILATION N/A 03/19/2016   Procedure: Venia Minks DILATION;  Surgeon: Daneil Dolin, MD;  Location: [facility];  Service: Endoscopy;  Laterality: N/A;  . POLYPECTOMY  12/12/2015   Procedure: POLYPECTOMY;  Surgeon: Daneil Dolin, MD;   Location: [facility] SUITE;  Service: Endoscopy;;  colon  . RECTOCELE REPAIR    . TOTAL ABDOMINAL HYSTERECTOMY W/ BILATERAL SALPINGOOPHORECTOMY       OB History    Gravida  2   Para  2   Term  2   Preterm      AB      Living        SAB      TAB      Ectopic      Multiple      Live Births              Family History  Problem Relation Age of Onset  . CAD Brother        2 brothers with CAD in their 20s  . Crohn's disease Brother        had colon resection  . Pancreatic cancer Brother   . CAD Father   . Heart failure Mother   . Diabetes Mellitus II Mother   . Colon cancer Neg Hx     Social History   Tobacco Use  . Smoking status: Never Smoker  . Smokeless tobacco: Never Used  Vaping Use  . Vaping Use: Never used  Substance Use Topics  . Alcohol use: No  . Drug use: No    Home Medications Prior to Admission medications   Medication Sig Start Date End Date Taking? Authorizing Provider  acetaminophen (TYLENOL) 325 MG tablet Take 650 mg by mouth every 6 (six) hours as  needed for mild pain or moderate pain.    Yes [provider]  cetirizine (ZYRTEC) 10 MG tablet Take 10 mg by mouth daily. 08/04/19  Yes [provider]  losartan (COZAAR) 100 MG tablet Take 100 mg by mouth daily.   Yes [provider]  pantoprazole (PROTONIX) 40 MG tablet TAKE ONE TABLET BY MOUTH ONCE DAILY. Patient taking differently: Take 40 mg by mouth daily.  08/07/16  Yes Mahala Menghini, PA-C  simvastatin (ZOCOR) 40 MG tablet Take 40 mg by mouth at bedtime.    Yes [provider]  fluconazole (DIFLUCAN) 150 MG tablet Take 1 tablet (150 mg total) by mouth daily as needed (yeast infection). Repeat if needed Patient not taking: Reported on 10/20/2019 12/05/18   Murlean Iba, MD    Allergies    Bee venom  Review of Systems   Review of Systems  All other systems reviewed and are negative.   Physical Exam Updated Vital Signs BP (!) 140/47    Pulse 77   Temp 98.4 F (36.9 C) (Oral)   Resp 18   Ht 5\' 1"  (1.549 m)   Wt 74.4 kg   SpO2 93%   BMI 30.99 kg/m   Physical Exam Vitals and nursing note reviewed.  Constitutional:      General: She is not in acute distress.    Appearance: She is well-developed. She is ill-appearing. She is not toxic-appearing or diaphoretic.  HENT:     Head: Normocephalic and atraumatic.     Right Ear: External ear normal.     Left Ear: External ear normal.  Eyes:     Conjunctiva/sclera: Conjunctivae normal.     Pupils: Pupils are equal, round, and reactive to light.  Neck:     Trachea: Phonation normal.  Cardiovascular:     Rate and Rhythm: Normal rate.  Pulmonary:     Effort: Pulmonary effort is normal. No respiratory distress.     Breath sounds: No stridor.  Chest:     Chest wall: No tenderness.  Abdominal:     General: There is no distension.     Palpations: Abdomen is soft.     Tenderness: There is no abdominal tenderness.  Musculoskeletal:        General: Normal range of motion.     Cervical back: Normal range of motion and neck supple.  Skin:    General: Skin is warm and dry.  Neurological:     Mental Status: She is alert and oriented to person, place, and time.     Cranial Nerves: No cranial nerve deficit.     Sensory: No sensory deficit.     Motor: No abnormal muscle tone.     Coordination: Coordination normal.  Psychiatric:        Mood and Affect: Mood normal.        Behavior: Behavior normal.     ED Results / Procedures / Treatments   Labs (all labs ordered are listed, but only abnormal results are displayed) Labs Reviewed  SARS CORONAVIRUS 2 BY RT PCR (HOSPITAL ORDER, [place] [facility]) - Abnormal; Notable for the following components:      Result Value   SARS Coronavirus 2 POSITIVE (*)    All other components within normal limits  COMPREHENSIVE METABOLIC PANEL - Abnormal; Notable for the following components:   Sodium 133 (*)    Glucose,  Bld 101 (*)    Creatinine, Ser 1.27 (*)    GFR calc  non Af Amer 40 (*)    GFR calc Af Amer 46 (*)    All other components within normal limits  CBC WITH DIFFERENTIAL/PLATELET  URINALYSIS, ROUTINE W REFLEX MICROSCOPIC    EKG None  Radiology DG Chest Port 1 View  Result Date: 10/20/2019 CLINICAL DATA:  Cough, shortness of breath EXAM: PORTABLE CHEST 1 VIEW COMPARISON:  05/21/2018 FINDINGS: Bibasilar atelectasis. Heart is normal size. No effusions. No acute bony abnormality. IMPRESSION: Bibasilar atelectasis. Electronically Signed   By: Rolm Baptise M.D.   On: 10/20/2019 11:12    Procedures Procedures (including critical care time)  Medications Ordered in ED Medications  acetaminophen (TYLENOL) tablet 650 mg (650 mg Oral Given 10/20/19 1215)    ED Course  I have reviewed the triage vital signs and the nursing notes.  Pertinent labs & imaging results that were available during my care of the patient were reviewed by me and considered in my medical decision making (see chart for details).  Clinical Course as of Oct 20 1802  Thu Oct 20, 2019  1802 Lymphocyte #: 1.5 [EW]    Clinical Course User Index [EW] Daleen Bo, MD   MDM Rules/Calculators/A&P                           Patient Vitals for the past 24 hrs:  BP Temp Temp src Pulse Resp SpO2 Height Weight  10/20/19 1600 (!) 140/47 98.4 F (36.9 C) Oral 77 -- 93 % -- --  10/20/19 1300 (!) 138/55 -- -- 85 -- 94 % -- --  10/20/19 1230 (!) 131/57 -- -- -- -- -- -- --  10/20/19 1214 -- (!) 100.4 F (38 C) Oral -- -- -- -- --  10/20/19 1200 (!) 161/48 -- -- 93 -- 97 % -- --  10/20/19 1156 (!) 153/55 (!) 100.5 F (38.1 C) Oral 94 18 95 % -- --  10/20/19 1130 (!) 153/55 -- -- 89 -- 93 % -- --  10/20/19 1100 (!) 144/56 -- -- 92 -- 92 % -- --  10/20/19 1030 (!) 143/56 -- -- 94 -- 92 % -- --  10/20/19 1021 (!) 154/56 (!) 100.5 F (38.1 C) Oral 92 -- 95 % -- --  10/20/19 0930 -- -- -- -- -- -- 5\' 1"  (1.549 m) 74.4 kg     At discharge- reevaluation with update and discussion. After initial assessment and treatment, an updated evaluation reveals she is comfortable has no further complaints, findings discussed and questions answered. Daleen Bo   Medical Decision Making:  This patient is presenting for evaluation of signs and symptoms of acute febrile illness with borderline low oxygenation initially, which does require a range of treatment options, and is a complaint that involves a high risk of morbidity and mortality. The differential diagnoses include COVID-19 infection, pneumonia, URI, UTI. I decided to review old records, and in summary elderly female who was at her first of 2 Covid injections, presenting now for acute URI symptoms..  I did not require additional historical information from anyone.  Clinical Laboratory Tests Ordered, included CBC, Metabolic panel, Urinalysis and Covid test. Review indicates Covid test is positive. Radiologic Tests Ordered, included chest x-ray.  I independently Visualized: Radiographic images, which show no acute abnormalities    Critical Interventions-clinical evaluation, laboratory testing, chest x-ray, observation and reassessment  After These Interventions, the Patient was reevaluated and was found with positive Covid test, presenting with borderline hypoxia.  Patient has had a  single of 2 Covid vaccines, started 2 weeks ago.  Patient remained stable in the ED and had no decompensation.  She does not meet criteria for hospitalization at this time.  I have arranged for the potentiality of outpatient monoclonal antibody treatment by contacting the clinic.  Patient states she will think about it before deciding if she will go to it.  DORIANNE PERRET was evaluated in Emergency Department on 10/20/2019 for the symptoms described in the history of present illness. She was evaluated in the context of the global COVID-19 pandemic, which necessitated consideration that the  patient might be at risk for infection with the SARS-CoV-2 virus that causes COVID-19. Institutional protocols and algorithms that pertain to the evaluation of patients at risk for COVID-19 are in a state of rapid change based on information released by regulatory bodies including the CDC and federal and state organizations. These policies and algorithms were followed during the patient's care in the ED.  CRITICAL CARE- yes  TELA KOTECKI was evaluated in Emergency Department on 10/20/2019 for the symptoms described in the history of present illness. She was evaluated in the context of the global COVID-19 pandemic, which necessitated consideration that the patient might be at risk for infection with the SARS-CoV-2 virus that causes COVID-19. Institutional protocols and algorithms that pertain to the evaluation of patients at risk for COVID-19 are in a state of rapid change based on information released by regulatory bodies including the CDC and federal and state organizations. These policies and algorithms were followed during the patient's care in the ED.  Performed by: Daleen Bo  Nursing Notes Reviewed/ Care Coordinated Applicable Imaging Reviewed Interpretation of Laboratory Data incorporated into ED treatment  The patient appears reasonably screened and/or stabilized for discharge and I doubt any other medical condition or other [facility] requiring further screening, evaluation, or treatment in the ED at this time prior to discharge.  Plan: Home Medications-continue usual medication use over-the-counter medication as needed.; Home Treatments-gradual advance diet food and activities; return here if the recommended treatment, does not improve the symptoms; Recommended follow up-follow-up for monoclonal antibody treatment as suggested.  See PCP if needed.    Final Clinical Impression(s) / ED Diagnoses Final diagnoses:  COVID-19 virus infection    Rx / DC Orders ED Discharge Orders    None        Daleen Bo, MD 10/20/19 1805

## 2019-10-20 NOTE — Discharge Instructions (Addendum)
You need to quarantine yourself, meaning stay away from people for at least 2 weeks.  Always wear a mask when you are out of your house.  Even in your house, you need to wear a mask when around people who do not have Covid.  For the Covid symptoms: Use Tylenol for fever, Robitussin-DM for cough, drink plenty of fluids, and try to eat 3 meals each day.  The infusion clinic will contact you to arrange for a monoclonal antibody treatment which can improve your ability and speed with which you recover from the Covid infection.

## 2019-10-21 ENCOUNTER — Telehealth: Payer: Self-pay | Admitting: Family

## 2019-10-21 DIAGNOSIS — U071 COVID-19: Secondary | ICD-10-CM

## 2019-10-21 NOTE — Telephone Encounter (Signed)
Called to discuss with Diane Torres about Covid symptoms and the use of casirivimab/imdevimab, a combination monoclonal antibody infusion for those with mild to moderate Covid symptoms and at a high risk of hospitalization.     Pt is qualified for this infusion at the [facility] infusion center due to co-morbid conditions and/or a member of an at-risk group, however I was unable to reach patient. Called and message came on that she has a VM box that has not been set up. No answer after multiple rings on home phone number.  Patient Active Problem List   Diagnosis Date Noted  . Diarrhea 12/02/2018  . Diverticulosis 12/02/2018  . Acute diverticulitis 06/30/2017  . Rectal bleeding 06/30/2017  . CKD (chronic kidney disease) stage 3, GFR 30-59 ml/min 06/30/2017  . LLQ pain 12/23/2016  . GERD (gastroesophageal reflux disease) 06/16/2016  . Gastroesophageal reflux disease with esophagitis 06/16/2016  . Precordial pain 03/10/2013  . Impaired fasting glucose 03/09/2013  . Mixed hyperlipidemia 03/09/2013  . Hypothyroidism 03/09/2013    [place]

## 2019-10-22 ENCOUNTER — Other Ambulatory Visit (HOSPITAL_COMMUNITY): Payer: Self-pay | Admitting: Adult Health

## 2019-10-22 DIAGNOSIS — U071 COVID-19: Secondary | ICD-10-CM

## 2019-10-22 NOTE — Progress Notes (Signed)
I connected by phone with Diane Torres on 10/22/2019 at 12:10 PM to discuss the potential use of a new treatment for mild to moderate COVID-19 viral infection in non-hospitalized patients.  This patient is a 79 y.o. female that meets the FDA criteria for Emergency Use Authorization of COVID monoclonal antibody casirivimab/imdevimab.  Has a (+) direct SARS-CoV-2 viral test result  Has mild or moderate COVID-19   Is NOT hospitalized due to COVID-19  Is within 10 days of symptom onset  Has at least one of the high risk factor(s) for progression to severe COVID-19 and/or hospitalization as defined in EUA.  Specific high risk criteria : Older age (>/= 80 yo) Sx onset 10/18/2019  I have spoken and communicated the following to the patient or parent/caregiver regarding COVID monoclonal antibody treatment:  1. FDA has authorized the emergency use for the treatment of mild to moderate COVID-19 in adults and pediatric patients with positive results of direct SARS-CoV-2 viral testing who are 69 years of age and older weighing at least 40 kg, and who are at high risk for progressing to severe COVID-19 and/or hospitalization.  2. The significant known and potential risks and benefits of COVID monoclonal antibody, and the extent to which such potential risks and benefits are unknown.  3. Information on available alternative treatments and the risks and benefits of those alternatives, including clinical trials.  4. Patients treated with COVID monoclonal antibody should continue to self-isolate and use infection control measures (e.g., wear mask, isolate, social distance, avoid sharing personal items, clean and disinfect "high touch" surfaces, and frequent handwashing) according to CDC guidelines.   5. The patient or parent/caregiver has the option to accept or refuse COVID monoclonal antibody treatment.  After reviewing this information with the patient, The patient agreed to proceed with receiving  casirivimab\imdevimab infusion and will be provided a copy of the Fact sheet prior to receiving the infusion. Scot Dock 10/22/2019 12:10 PM

## 2019-10-23 ENCOUNTER — Ambulatory Visit (HOSPITAL_COMMUNITY)
Admission: RE | Admit: 2019-10-23 | Discharge: 2019-10-23 | Disposition: A | Discharge status: Home / Self Care | Payer: Medicare Other | Source: Ambulatory Visit | Attending: Pulmonary Disease | Admitting: Pulmonary Disease

## 2019-10-23 DIAGNOSIS — U071 COVID-19: Secondary | ICD-10-CM | POA: Insufficient documentation

## 2019-10-23 DIAGNOSIS — Z23 Encounter for immunization: Secondary | ICD-10-CM | POA: Diagnosis not present

## 2019-10-23 MED ORDER — FAMOTIDINE IN NACL 20-0.9 MG/50ML-% IV SOLN: 20.0000 mg | Freq: Once | INTRAVENOUS | Status: DC | PRN

## 2019-10-23 MED ORDER — EPINEPHRINE 0.3 MG/0.3ML IJ SOAJ: 0.3000 mg | Freq: Once | INTRAMUSCULAR | Status: DC | PRN

## 2019-10-23 MED ORDER — ALBUTEROL SULFATE HFA 108 (90 BASE) MCG/ACT IN AERS: 2.0000 | INHALATION_SPRAY | Freq: Once | RESPIRATORY_TRACT | Status: DC | PRN

## 2019-10-23 MED ORDER — SODIUM CHLORIDE 0.9 % IV SOLN: INTRAVENOUS | Status: DC | PRN

## 2019-10-23 MED ORDER — DIPHENHYDRAMINE HCL 50 MG/ML IJ SOLN: 50.0000 mg | Freq: Once | INTRAMUSCULAR | Status: DC | PRN

## 2019-10-23 MED ORDER — METHYLPREDNISOLONE SODIUM SUCC 125 MG IJ SOLR: 125.0000 mg | Freq: Once | INTRAMUSCULAR | Status: DC | PRN

## 2019-10-23 MED ORDER — SODIUM CHLORIDE 0.9 % IV SOLN
1200.0000 mg | Freq: Once | INTRAVENOUS | Status: AC
  Administered 2019-10-23: 1200 mg via INTRAVENOUS

## 2019-10-23 NOTE — Progress Notes (Signed)
  Diagnosis: COVID-19  Physician: Dr. Asencion Noble  Procedure: Covid Infusion Clinic Med: casirivimab\imdevimab infusion - Provided patient with casirivimab\imdevimab fact sheet for patients, parents and caregivers prior to infusion.  Complications: No immediate complications noted.  Discharge: Discharged home   Janine Ores 10/23/2019

## 2019-10-23 NOTE — Discharge Instructions (Signed)

## 2019-11-03 ENCOUNTER — Other Ambulatory Visit: Payer: Self-pay

## 2019-11-03 ENCOUNTER — Other Ambulatory Visit: Payer: Self-pay | Admitting: *Deleted

## 2019-11-03 ENCOUNTER — Other Ambulatory Visit: Payer: Medicare Other

## 2019-11-03 DIAGNOSIS — Z20822 Contact with and (suspected) exposure to covid-19: Secondary | ICD-10-CM

## 2019-11-05 LAB — NOVEL CORONAVIRUS, NAA: SARS-CoV-2, NAA: NOT DETECTED

## 2019-11-05 LAB — SPECIMEN STATUS REPORT

## 2019-11-05 LAB — SARS-COV-2, NAA 2 DAY TAT

## 2019-11-25 DIAGNOSIS — E119 Type 2 diabetes mellitus without complications: Secondary | ICD-10-CM | POA: Diagnosis not present

## 2019-11-30 DIAGNOSIS — Z23 Encounter for immunization: Secondary | ICD-10-CM | POA: Diagnosis not present

## 2019-11-30 DIAGNOSIS — R7303 Prediabetes: Secondary | ICD-10-CM | POA: Diagnosis not present

## 2019-11-30 DIAGNOSIS — R5383 Other fatigue: Secondary | ICD-10-CM | POA: Diagnosis not present

## 2019-11-30 DIAGNOSIS — E785 Hyperlipidemia, unspecified: Secondary | ICD-10-CM | POA: Diagnosis not present

## 2019-11-30 DIAGNOSIS — N183 Chronic kidney disease, stage 3 unspecified: Secondary | ICD-10-CM | POA: Diagnosis not present

## 2019-11-30 DIAGNOSIS — J302 Other seasonal allergic rhinitis: Secondary | ICD-10-CM | POA: Diagnosis not present

## 2019-11-30 DIAGNOSIS — E669 Obesity, unspecified: Secondary | ICD-10-CM | POA: Diagnosis not present

## 2019-11-30 DIAGNOSIS — I10 Essential (primary) hypertension: Secondary | ICD-10-CM | POA: Diagnosis not present

## 2019-11-30 DIAGNOSIS — K219 Gastro-esophageal reflux disease without esophagitis: Secondary | ICD-10-CM | POA: Diagnosis not present

## 2019-11-30 DIAGNOSIS — K5792 Diverticulitis of intestine, part unspecified, without perforation or abscess without bleeding: Secondary | ICD-10-CM | POA: Diagnosis not present

## 2019-11-30 DIAGNOSIS — Z6832 Body mass index (BMI) 32.0-32.9, adult: Secondary | ICD-10-CM | POA: Diagnosis not present

## 2020-03-15 ENCOUNTER — Ambulatory Visit (HOSPITAL_COMMUNITY): Payer: Medicare Other | Admitting: Physical Therapy

## 2020-08-05 ENCOUNTER — Other Ambulatory Visit: Payer: Self-pay

## 2020-08-05 ENCOUNTER — Emergency Department (HOSPITAL_COMMUNITY)
Admission: EM | Admit: 2020-08-05 | Discharge: 2020-08-05 | Disposition: A | Discharge status: Home / Self Care | Payer: Medicare Other | Attending: Emergency Medicine | Admitting: Emergency Medicine

## 2020-08-05 ENCOUNTER — Encounter (HOSPITAL_COMMUNITY): Payer: Self-pay

## 2020-08-05 DIAGNOSIS — N183 Chronic kidney disease, stage 3 unspecified: Secondary | ICD-10-CM | POA: Insufficient documentation

## 2020-08-05 DIAGNOSIS — Z79899 Other long term (current) drug therapy: Secondary | ICD-10-CM | POA: Insufficient documentation

## 2020-08-05 DIAGNOSIS — M5441 Lumbago with sciatica, right side: Secondary | ICD-10-CM | POA: Insufficient documentation

## 2020-08-05 DIAGNOSIS — M545 Low back pain, unspecified: Secondary | ICD-10-CM | POA: Diagnosis present

## 2020-08-05 DIAGNOSIS — E039 Hypothyroidism, unspecified: Secondary | ICD-10-CM | POA: Insufficient documentation

## 2020-08-05 LAB — CBC WITH DIFFERENTIAL/PLATELET
Abs Immature Granulocytes: 0.03 10*3/uL (ref 0.00–0.07)
Basophils Absolute: 0.1 10*3/uL (ref 0.0–0.1)
Basophils Relative: 1 %
Eosinophils Absolute: 0.2 10*3/uL (ref 0.0–0.5)
Eosinophils Relative: 3 %
HCT: 37.9 % (ref 36.0–46.0)
Hemoglobin: 12.1 g/dL (ref 12.0–15.0)
Immature Granulocytes: 0 %
Lymphocytes Relative: 31 %
Lymphs Abs: 2.1 10*3/uL (ref 0.7–4.0)
MCH: 30.5 pg (ref 26.0–34.0)
MCHC: 31.9 g/dL (ref 30.0–36.0)
MCV: 95.5 fL (ref 80.0–100.0)
Monocytes Absolute: 0.5 10*3/uL (ref 0.1–1.0)
Monocytes Relative: 7 %
Neutro Abs: 3.9 10*3/uL (ref 1.7–7.7)
Neutrophils Relative %: 58 %
Platelets: 244 10*3/uL (ref 150–400)
RBC: 3.97 MIL/uL (ref 3.87–5.11)
RDW: 14.2 % (ref 11.5–15.5)
WBC: 6.8 10*3/uL (ref 4.0–10.5)
nRBC: 0 % (ref 0.0–0.2)

## 2020-08-05 LAB — URINALYSIS, ROUTINE W REFLEX MICROSCOPIC
Bilirubin Urine: NEGATIVE
Glucose, UA: NEGATIVE mg/dL
Ketones, ur: NEGATIVE mg/dL
Nitrite: NEGATIVE
Protein, ur: NEGATIVE mg/dL
Specific Gravity, Urine: 1.01 (ref 1.005–1.030)
pH: 5 (ref 5.0–8.0)

## 2020-08-05 LAB — BASIC METABOLIC PANEL
Anion gap: 5 (ref 5–15)
BUN: 22 mg/dL (ref 8–23)
CO2: 23 mmol/L (ref 22–32)
Calcium: 8.8 mg/dL — ABNORMAL LOW (ref 8.9–10.3)
Chloride: 106 mmol/L (ref 98–111)
Creatinine, Ser: 1.11 mg/dL — ABNORMAL HIGH (ref 0.44–1.00)
GFR, Estimated: 50 mL/min — ABNORMAL LOW (ref >60)
Glucose, Bld: 108 mg/dL — ABNORMAL HIGH (ref 70–99)
Potassium: 4.3 mmol/L (ref 3.5–5.1)
Sodium: 134 mmol/L — ABNORMAL LOW (ref 135–145)

## 2020-08-05 MED ORDER — HYDROCODONE-ACETAMINOPHEN 5-325 MG PO TABS
1.0000 | ORAL_TABLET | Freq: Once | ORAL | Status: AC
  Administered 2020-08-05: 12:00:00 1 via ORAL
  Filled 2020-08-05: qty 1

## 2020-08-05 MED ORDER — OXYCODONE-ACETAMINOPHEN 5-325 MG PO TABS: 1.0000 | ORAL_TABLET | ORAL | 0 refills | Status: DC | PRN

## 2020-08-05 MED ORDER — DEXAMETHASONE SODIUM PHOSPHATE 10 MG/ML IJ SOLN
10.0000 mg | Freq: Once | INTRAMUSCULAR | Status: AC
  Administered 2020-08-05: 12:00:00 10 mg via INTRAMUSCULAR
  Filled 2020-08-05: qty 1

## 2020-08-05 MED ORDER — HYDROMORPHONE HCL 1 MG/ML IJ SOLN
0.5000 mg | Freq: Once | INTRAMUSCULAR | Status: AC
  Administered 2020-08-05: 14:00:00 0.5 mg via INTRAMUSCULAR
  Filled 2020-08-05: qty 1

## 2020-08-05 NOTE — Discharge Instructions (Addendum)
Try alternating ice and heat to your lower back.  Avoid excessive bending or twisting movements.  Do not take any additional Tylenol while taking the oxycodone.  You may want to try taking 1/2 tablet every 4-6 hours for your pain.  Please follow-up with your primary care provider this week for recheck.  Return to the emergency department for any new or worsening symptoms.

## 2020-08-05 NOTE — ED Provider Notes (Signed)
[facility] EMERGENCY DEPARTMENT Provider Note   CSN: [id number] Arrival date & time: 08/05/20  5621     History Chief Complaint  Patient presents with   Back Pain    Diane Torres is a 80 y.o. female.   Back Pain Associated symptoms: no abdominal pain, no dysuria, no fever, no numbness and no weakness        Diane Torres is a 80 y.o. female who presents to the Emergency Department complaining of right-sided lower back pain after exercise class 3 days ago.  She describes a waxing and waning sharp pain to her right lower back that radiates into her buttock and upper right thigh.  Pain is worse with movement and standing.  Pain improves at rest.  She states that she has had sciatica in the past and current pain feels similar but more intense.  She has taken Tylenol without relief.  She denies any abdominal pain, urine or bowel changes, numbness or weakness of her lower extremities.  No fever or chills.    Past Medical History:  Diagnosis Date   CAP (community acquired pneumonia) 05/2018   Diverticulitis    CT verified in 2015, 2018, 2019, 2020   GERD (gastroesophageal reflux disease)    Hypothyroidism    Impaired fasting glucose    Mixed hyperlipidemia     Patient Active Problem List   Diagnosis Date Noted   Diarrhea 12/02/2018   Diverticulosis 12/02/2018   Acute diverticulitis 06/30/2017   Rectal bleeding 06/30/2017   CKD (chronic kidney disease) stage 3, GFR 30-59 ml/min 06/30/2017   LLQ pain 12/23/2016   GERD (gastroesophageal reflux disease) 06/16/2016   Gastroesophageal reflux disease with esophagitis 06/16/2016   Precordial pain 03/10/2013   Impaired fasting glucose 03/09/2013   Mixed hyperlipidemia 03/09/2013   Hypothyroidism 03/09/2013    Past Surgical History:  Procedure Laterality Date   COLONOSCOPY     COLONOSCOPY N/A 12/12/2015   Dr. Gala Romney: one 3 mm tubular adenoma in rectum s/p removal. Diverticulosis in entire colon. Internal hemorrhoids.     ESOPHAGOGASTRODUODENOSCOPY N/A 03/19/2016   Dr. Gala Romney: LA Grade A esophagitis, empiric dilation, normal stomach, normal duodenum   Left lobe thyroidectomy     MALONEY DILATION N/A 03/19/2016   Procedure: Venia Minks DILATION;  Surgeon: Daneil Dolin, MD;  Location: [facility];  Service: Endoscopy;  Laterality: N/A;   POLYPECTOMY  12/12/2015   Procedure: POLYPECTOMY;  Surgeon: Daneil Dolin, MD;  Location: [facility];  Service: Endoscopy;;  colon   RECTOCELE REPAIR     TOTAL ABDOMINAL HYSTERECTOMY W/ BILATERAL SALPINGOOPHORECTOMY       OB History     Gravida  2   Para  2   Term  2   Preterm      AB      Living         SAB      IAB      Ectopic      Multiple      Live Births              Family History  Problem Relation Age of Onset   CAD Brother        2 brothers with CAD in their 69s   Crohn's disease Brother        had colon resection   Pancreatic cancer Brother    CAD Father    Heart failure Mother    Diabetes Mellitus II Mother    Colon  cancer Neg Hx     Social History   Tobacco Use   Smoking status: Never   Smokeless tobacco: Never  Vaping Use   Vaping Use: Never used  Substance Use Topics   Alcohol use: No   Drug use: No    Home Medications Prior to Admission medications   Medication Sig Start Date End Date Taking? Authorizing Provider  acetaminophen (TYLENOL) 325 MG tablet Take 650 mg by mouth every 6 (six) hours as needed for mild pain or moderate pain.     [provider]  cetirizine (ZYRTEC) 10 MG tablet Take 10 mg by mouth daily. 08/04/19   [provider]  fluconazole (DIFLUCAN) 150 MG tablet Take 1 tablet (150 mg total) by mouth daily as needed (yeast infection). Repeat if needed Patient not taking: Reported on 10/20/2019 12/05/18   Murlean Iba, MD  losartan (COZAAR) 100 MG tablet Take 100 mg by mouth daily.    [provider]  pantoprazole (PROTONIX) 40 MG tablet TAKE ONE TABLET BY MOUTH ONCE  DAILY. Patient taking differently: Take 40 mg by mouth daily.  08/07/16   Mahala Menghini, PA-C  simvastatin (ZOCOR) 40 MG tablet Take 40 mg by mouth at bedtime.     [provider]    Allergies    Bee venom  Review of Systems   Review of Systems  Constitutional:  Negative for fever.  Respiratory:  Negative for shortness of breath.   Gastrointestinal:  Negative for abdominal pain, constipation and vomiting.  Genitourinary:  Negative for decreased urine volume, difficulty urinating, dysuria, flank pain and hematuria.  Musculoskeletal:  Positive for back pain. Negative for joint swelling.  Skin:  Negative for rash.  Neurological:  Negative for weakness and numbness.   Physical Exam Updated Vital Signs BP (!) 167/68 (BP Location: Right Arm)   Pulse 73   Temp 97.8 F (36.6 C) (Oral)   Resp 18   Ht 5\' 1"  (1.549 m)   Wt 72.6 kg   SpO2 95%   BMI 30.23 kg/m   Physical Exam Vitals and nursing note reviewed.  Constitutional:      General: She is not in acute distress.    Appearance: Normal appearance. She is not ill-appearing.  HENT:     Head: Normocephalic.  Neck:     Thyroid: No thyromegaly.     Meningeal: Kernig's sign absent.  Cardiovascular:     Rate and Rhythm: Normal rate and regular rhythm.     Pulses: Normal pulses.  Pulmonary:     Effort: Pulmonary effort is normal.     Breath sounds: Normal breath sounds. No wheezing.  Abdominal:     Palpations: Abdomen is soft.     Tenderness: There is no abdominal tenderness. There is no guarding or rebound.  Musculoskeletal:     Cervical back: Normal range of motion and neck supple.     Right lower leg: No edema.     Left lower leg: No edema.     Comments: Tenderness palpation of the right lumbar paraspinal muscles and SI joint.  Hip flexors and extensors are intact.  She has 5 out of 5 strength of the bilateral lower extremities.  Skin:    General: Skin is warm.     Capillary Refill: Capillary refill takes less  than 2 seconds.     Findings: No rash.  Neurological:     General: No focal deficit present.     Mental Status: She is alert.  Sensory: Sensation is intact. No sensory deficit.     Motor: Motor function is intact. No weakness.     Deep Tendon Reflexes:     Reflex Scores:      Patellar reflexes are 2+ on the right side and 2+ on the left side.      Achilles reflexes are 2+ on the right side and 2+ on the left side.   ED Results / Procedures / Treatments   Labs (all labs ordered are listed, but only abnormal results are displayed) Labs Reviewed  BASIC METABOLIC PANEL - Abnormal; Notable for the following components:      Result Value   Sodium 134 (*)    Glucose, Bld 108 (*)    Creatinine, Ser 1.11 (*)    Calcium 8.8 (*)    GFR, Estimated 50 (*)    All other components within normal limits  URINALYSIS, ROUTINE W REFLEX MICROSCOPIC - Abnormal; Notable for the following components:   Color, Urine STRAW (*)    Hgb urine dipstick SMALL (*)    Leukocytes,Ua SMALL (*)    Bacteria, UA RARE (*)    All other components within normal limits  URINE CULTURE  CBC WITH DIFFERENTIAL/PLATELET    EKG None  Radiology No results found.  Procedures Procedures   Medications Ordered in ED Medications - No data to display  ED Course  I have reviewed the triage vital signs and the nursing notes.  Pertinent labs & imaging results that were available during my care of the patient were reviewed by me and considered in my medical decision making (see chart for details).    MDM Rules/Calculators/A&P                          Patient here for evaluation of right-sided low back pain.  Has history of sciatica.  Pain began after participating in an exercise class.  No history of trauma.  No focal neurodeficits on her exam.  Will check labs and urinalysis.  Patient ambulated in the department with steady gait.  Witnessed by me.  No focal neurodeficits.  She reports feeling better and states  she is ready for discharge home.  Exam overall reassuring.  She does have some renal insufficiency that appears baseline.  Urinalysis showed small amount of blood and leukocytes with rare bacteria.  She does not have any dysuria symptoms at this time.  We will culture her urine.  Doubt infectious process, no red flags for emergent process.  No history of trauma to suggest need for imaging at this time.    I feel that she is appropriate for discharge home will provide short course of pain medication and she is agreeable to patient follow-up with PCP.   Final Clinical Impression(s) / ED Diagnoses Final diagnoses:  Acute right-sided low back pain with right-sided sciatica    Rx / DC Orders ED Discharge Orders     None        Kem Parkinson, PA-C 08/05/20 1458    Fredia Sorrow, MD 08/09/20 [phone]

## 2020-08-05 NOTE — ED Notes (Signed)
Pt ambulate in the room without any issues.

## 2020-08-05 NOTE — ED Triage Notes (Signed)
Pt to er, pt states that she had just finished an exercise class at church and was getting ready to leave when she had a sudden onset of back pain, states that it feels like a muscle spasm.  Pt states that the pain is going down her R leg, states that she has had sciatica before and this feels worse.

## 2020-08-05 NOTE — ED Notes (Signed)
Notified pt. To let us know when they need to use the restroom. Pt. States they probably won't be able to pee.

## 2020-08-07 ENCOUNTER — Ambulatory Visit (HOSPITAL_COMMUNITY)
Admission: RE | Admit: 2020-08-07 | Discharge: 2020-08-07 | Disposition: A | Discharge status: Home / Self Care | Payer: Medicare Other | Source: Ambulatory Visit | Attending: Student | Admitting: Student

## 2020-08-07 ENCOUNTER — Other Ambulatory Visit (HOSPITAL_COMMUNITY): Payer: Self-pay | Admitting: *Deleted

## 2020-08-07 DIAGNOSIS — R109 Unspecified abdominal pain: Secondary | ICD-10-CM | POA: Diagnosis not present

## 2020-08-07 LAB — URINE CULTURE: Culture: <10000 — AB

## 2020-09-10 ENCOUNTER — Other Ambulatory Visit: Payer: Self-pay | Admitting: Physical Medicine and Rehabilitation

## 2020-09-10 ENCOUNTER — Other Ambulatory Visit (HOSPITAL_COMMUNITY): Payer: Self-pay | Admitting: Physical Medicine and Rehabilitation

## 2020-09-10 DIAGNOSIS — R29898 Other symptoms and signs involving the musculoskeletal system: Secondary | ICD-10-CM

## 2020-09-10 DIAGNOSIS — M545 Low back pain, unspecified: Secondary | ICD-10-CM

## 2020-09-24 ENCOUNTER — Other Ambulatory Visit: Payer: Self-pay

## 2020-09-24 ENCOUNTER — Ambulatory Visit (HOSPITAL_COMMUNITY)
Admission: RE | Admit: 2020-09-24 | Discharge: 2020-09-24 | Disposition: A | Discharge status: Home / Self Care | Payer: Medicare Other | Source: Ambulatory Visit | Attending: Physical Medicine and Rehabilitation | Admitting: Physical Medicine and Rehabilitation

## 2020-09-24 DIAGNOSIS — R29898 Other symptoms and signs involving the musculoskeletal system: Secondary | ICD-10-CM | POA: Diagnosis present

## 2020-09-24 DIAGNOSIS — M545 Low back pain, unspecified: Secondary | ICD-10-CM | POA: Diagnosis not present

## 2020-10-22 ENCOUNTER — Encounter: Payer: Self-pay | Admitting: Urology

## 2020-10-22 ENCOUNTER — Ambulatory Visit: Payer: Medicare Other | Admitting: Urology

## 2020-10-22 ENCOUNTER — Other Ambulatory Visit: Payer: Self-pay

## 2020-10-22 VITALS — BP 148/82 | HR 76 | Ht 61.0 in | Wt 153.2 lb

## 2020-10-22 DIAGNOSIS — R109 Unspecified abdominal pain: Secondary | ICD-10-CM

## 2020-10-22 DIAGNOSIS — R3129 Other microscopic hematuria: Secondary | ICD-10-CM | POA: Diagnosis not present

## 2020-10-22 LAB — URINALYSIS, ROUTINE W REFLEX MICROSCOPIC
Bilirubin, UA: NEGATIVE
Glucose, UA: NEGATIVE
Ketones, UA: NEGATIVE
Nitrite, UA: NEGATIVE
Protein,UA: NEGATIVE
RBC, UA: NEGATIVE
Specific Gravity, UA: 1.015 (ref 1.005–1.030)
Urobilinogen, Ur: 0.2 mg/dL (ref 0.2–1.0)
pH, UA: 6 (ref 5.0–7.5)

## 2020-10-22 NOTE — Progress Notes (Signed)
Urological Symptom Review  Patient is experiencing the following symptoms: Hard to postpone urination Get up at night to urinate   Review of Systems  Gastrointestinal (upper)  : Indigestion/heartburn  Gastrointestinal (lower) : Negative for lower GI symptoms  Constitutional : Fatigue  Skin: Negative for skin symptoms  Eyes: Negative for eye symptoms  Ear/Nose/Throat : Negative for Ear/Nose/Throat symptoms  Hematologic/Lymphatic: Easy bruising  Cardiovascular : Negative for cardiovascular symptoms  Respiratory : Cough  Endocrine: Negative for endocrine symptoms  Musculoskeletal: Back pain  Neurological: Negative for neurological symptoms  Psychologic: Negative for psychiatric symptoms

## 2020-10-22 NOTE — Progress Notes (Signed)
10/22/2020 11:29 AM   Diane Torres [birth year] [id number]  Referring provider: Celene Squibb, MD [address] [name],  [place] [address]  No chief complaint on file.   HPI:  New patient-  1) microscopic hematuria-Trishia was referred over for microscopic hematuria.  3 years ago she had a UA with 21-50 red cells.  More recently it has been clear.  She has had some back pain.  A July 2022 KUB was negative.  A August 2022 L-spine MRI showed L1-L2 and L2-L3 disease.  There was no hydronephrosis on the visualized portion of the kidneys. She has had no gross hematuria. No h/o kidney stones. Non-smoker. She did work in [employer] and was occasionally in the dye room.    PMH: Past Medical History:  Diagnosis Date   CAP (community acquired pneumonia) 05/2018   Diverticulitis    CT verified in 2015, 2018, 2019, 2020   GERD (gastroesophageal reflux disease)    Hypothyroidism    Impaired fasting glucose    Mixed hyperlipidemia     Surgical History: Past Surgical History:  Procedure Laterality Date   COLONOSCOPY     COLONOSCOPY N/A 12/12/2015   Dr. Gala Romney: one 3 mm tubular adenoma in rectum s/p removal. Diverticulosis in entire colon. Internal hemorrhoids.    ESOPHAGOGASTRODUODENOSCOPY N/A 03/19/2016   Dr. Gala Romney: LA Grade A esophagitis, empiric dilation, normal stomach, normal duodenum   Left lobe thyroidectomy     MALONEY DILATION N/A 03/19/2016   Procedure: Venia Minks DILATION;  Surgeon: Daneil Dolin, MD;  Location: [facility];  Service: Endoscopy;  Laterality: N/A;   POLYPECTOMY  12/12/2015   Procedure: POLYPECTOMY;  Surgeon: Daneil Dolin, MD;  Location: [facility];  Service: Endoscopy;;  colon   RECTOCELE REPAIR     TOTAL ABDOMINAL HYSTERECTOMY W/ BILATERAL SALPINGOOPHORECTOMY      Home Medications:  Allergies as of 10/22/2020       Reactions   Bee Venom Anaphylaxis        Medication List        Accurate as of October 22, 2020 11:29 AM. If you have any  questions, ask your nurse or doctor.          cetirizine 10 MG tablet Commonly known as: ZYRTEC Take 10 mg by mouth daily.   fluconazole 150 MG tablet Commonly known as: DIFLUCAN Take 1 tablet (150 mg total) by mouth daily as needed (yeast infection). Repeat if needed   losartan 100 MG tablet Commonly known as: COZAAR Take 100 mg by mouth daily.   oxyCODONE-acetaminophen 5-325 MG tablet Commonly known as: PERCOCET/ROXICET Take 1 tablet by mouth every 4 (four) hours as needed.   pantoprazole 40 MG tablet Commonly known as: PROTONIX TAKE ONE TABLET BY MOUTH ONCE DAILY.   simvastatin 40 MG tablet Commonly known as: ZOCOR Take 40 mg by mouth at bedtime.        Allergies:  Allergies  Allergen Reactions   Bee Venom Anaphylaxis    Family History: Family History  Problem Relation Age of Onset   CAD Brother        2 brothers with CAD in their 27s   Crohn's disease Brother        had colon resection   Pancreatic cancer Brother    CAD Father    Heart failure Mother    Diabetes Mellitus II Mother    Colon cancer Neg Hx     Social History:  reports that she has never  smoked. She has never used smokeless tobacco. She reports that she does not drink alcohol and does not use drugs.   Physical Exam: There were no vitals taken for this visit.  Constitutional:  Alert and oriented, No acute distress. HEENT: [place] AT, moist mucus membranes.  Trachea midline, no masses. Cardiovascular: No clubbing, cyanosis, or edema. Respiratory: Normal respiratory effort, no increased work of breathing. GI: Abdomen is soft, nontender, nondistended, no abdominal masses GU: No CVA tenderness Skin: No rashes, bruises or suspicious lesions. Neurologic: Grossly intact, no focal deficits, moving all 4 extremities. Psychiatric: Normal mood and affect.  Laboratory Data: Lab Results  Component Value Date   WBC 6.8 08/05/2020   HGB 12.1 08/05/2020   HCT 37.9 08/05/2020   MCV 95.5 08/05/2020    PLT 244 08/05/2020    Lab Results  Component Value Date   CREATININE 1.11 (H) 08/05/2020    No results found for: PSA  No results found for: TESTOSTERONE  No results found for: HGBA1C  Urinalysis    Component Value Date/Time   COLORURINE STRAW (A) 08/05/2020 1324   APPEARANCEUR CLEAR 08/05/2020 1324   LABSPEC 1.010 08/05/2020 1324   PHURINE 5.0 08/05/2020 1324   GLUCOSEU NEGATIVE 08/05/2020 1324   HGBUR SMALL (A) 08/05/2020 1324   BILIRUBINUR NEGATIVE 08/05/2020 1324   KETONESUR NEGATIVE 08/05/2020 1324   PROTEINUR NEGATIVE 08/05/2020 1324   UROBILINOGEN 0.2 09/07/2006 1322   NITRITE NEGATIVE 08/05/2020 1324   LEUKOCYTESUR SMALL (A) 08/05/2020 1324    Lab Results  Component Value Date   BACTERIA RARE (A) 08/05/2020    Pertinent Imaging:  Results for orders placed during the hospital encounter of 08/07/20  DG Abd 1 View  Narrative CLINICAL DATA:  Acute right flank pain.  EXAM: ABDOMEN - 1 VIEW  COMPARISON:  None.  FINDINGS: The bowel gas pattern is normal. No radio-opaque calculi or other significant radiographic abnormality are seen.  IMPRESSION: Negative.   Electronically Signed By: Marijo Conception M.D. On: 08/08/2020 08:58  No results found for this or any previous visit.  No results found for this or any previous visit.  No results found for this or any previous visit.  No results found for this or any previous visit.  No results found for this or any previous visit.  No results found for this or any previous visit.  No results found for this or any previous visit.   Assessment & Plan:    1. Microhematuria - discussed MH etiology - benign and malignant. Will set up for CT scan and cystoscopy- disc nature r/b/a to these tests.  - Urinalysis, Routine w reflex microscopic   No follow-ups on file.  Festus Aloe, MD  [facility]  [address] [place],  [address] [phone]

## 2020-10-27 ENCOUNTER — Encounter (HOSPITAL_COMMUNITY): Payer: Self-pay | Admitting: Emergency Medicine

## 2020-10-27 ENCOUNTER — Emergency Department (HOSPITAL_COMMUNITY): Payer: Medicare Other

## 2020-10-27 ENCOUNTER — Other Ambulatory Visit: Payer: Self-pay

## 2020-10-27 ENCOUNTER — Observation Stay (HOSPITAL_COMMUNITY)
Admission: EM | Admit: 2020-10-27 | Discharge: 2020-10-29 | Disposition: A | Discharge status: Home / Self Care | Payer: Medicare Other | Attending: Internal Medicine | Admitting: Internal Medicine

## 2020-10-27 DIAGNOSIS — Z79899 Other long term (current) drug therapy: Secondary | ICD-10-CM | POA: Insufficient documentation

## 2020-10-27 DIAGNOSIS — I10 Essential (primary) hypertension: Secondary | ICD-10-CM

## 2020-10-27 DIAGNOSIS — Z20822 Contact with and (suspected) exposure to covid-19: Secondary | ICD-10-CM | POA: Insufficient documentation

## 2020-10-27 DIAGNOSIS — D72829 Elevated white blood cell count, unspecified: Secondary | ICD-10-CM | POA: Diagnosis not present

## 2020-10-27 DIAGNOSIS — N1831 Chronic kidney disease, stage 3a: Secondary | ICD-10-CM | POA: Insufficient documentation

## 2020-10-27 DIAGNOSIS — N183 Chronic kidney disease, stage 3 unspecified: Secondary | ICD-10-CM | POA: Diagnosis present

## 2020-10-27 DIAGNOSIS — E236 Other disorders of pituitary gland: Secondary | ICD-10-CM

## 2020-10-27 DIAGNOSIS — M5440 Lumbago with sciatica, unspecified side: Secondary | ICD-10-CM

## 2020-10-27 DIAGNOSIS — R900 Intracranial space-occupying lesion found on diagnostic imaging of central nervous system: Secondary | ICD-10-CM

## 2020-10-27 DIAGNOSIS — I129 Hypertensive chronic kidney disease with stage 1 through stage 4 chronic kidney disease, or unspecified chronic kidney disease: Secondary | ICD-10-CM | POA: Diagnosis not present

## 2020-10-27 DIAGNOSIS — G8929 Other chronic pain: Secondary | ICD-10-CM

## 2020-10-27 DIAGNOSIS — R55 Syncope and collapse: Principal | ICD-10-CM | POA: Diagnosis present

## 2020-10-27 DIAGNOSIS — E039 Hypothyroidism, unspecified: Secondary | ICD-10-CM | POA: Insufficient documentation

## 2020-10-27 DIAGNOSIS — M549 Dorsalgia, unspecified: Secondary | ICD-10-CM

## 2020-10-27 DIAGNOSIS — K219 Gastro-esophageal reflux disease without esophagitis: Secondary | ICD-10-CM | POA: Diagnosis present

## 2020-10-27 DIAGNOSIS — D539 Nutritional anemia, unspecified: Secondary | ICD-10-CM

## 2020-10-27 DIAGNOSIS — E782 Mixed hyperlipidemia: Secondary | ICD-10-CM | POA: Diagnosis present

## 2020-10-27 LAB — CBC
HCT: 35.7 % — ABNORMAL LOW (ref 36.0–46.0)
Hemoglobin: 11.2 g/dL — ABNORMAL LOW (ref 12.0–15.0)
MCH: 31.6 pg (ref 26.0–34.0)
MCHC: 31.4 g/dL (ref 30.0–36.0)
MCV: 100.8 fL — ABNORMAL HIGH (ref 80.0–100.0)
Platelets: 308 10*3/uL (ref 150–400)
RBC: 3.54 MIL/uL — ABNORMAL LOW (ref 3.87–5.11)
RDW: 15.9 % — ABNORMAL HIGH (ref 11.5–15.5)
WBC: 12 10*3/uL — ABNORMAL HIGH (ref 4.0–10.5)
nRBC: 0 % (ref 0.0–0.2)

## 2020-10-27 LAB — BASIC METABOLIC PANEL
Anion gap: 11 (ref 5–15)
BUN: 30 mg/dL — ABNORMAL HIGH (ref 8–23)
CO2: 18 mmol/L — ABNORMAL LOW (ref 22–32)
Calcium: 8.9 mg/dL (ref 8.9–10.3)
Chloride: 105 mmol/L (ref 98–111)
Creatinine, Ser: 1.52 mg/dL — ABNORMAL HIGH (ref 0.44–1.00)
GFR, Estimated: 34 mL/min — ABNORMAL LOW (ref >60)
Glucose, Bld: 116 mg/dL — ABNORMAL HIGH (ref 70–99)
Potassium: 4.1 mmol/L (ref 3.5–5.1)
Sodium: 134 mmol/L — ABNORMAL LOW (ref 135–145)

## 2020-10-27 LAB — HEPATIC FUNCTION PANEL
ALT: 22 U/L (ref 0–44)
AST: 21 U/L (ref 15–41)
Albumin: 3.5 g/dL (ref 3.5–5.0)
Alkaline Phosphatase: 60 U/L (ref 38–126)
Bilirubin, Direct: 0.1 mg/dL (ref 0.0–0.2)
Indirect Bilirubin: 0.2 mg/dL — ABNORMAL LOW (ref 0.3–0.9)
Total Bilirubin: 0.3 mg/dL (ref 0.3–1.2)
Total Protein: 6.6 g/dL (ref 6.5–8.1)

## 2020-10-27 LAB — TROPONIN I (HIGH SENSITIVITY)
Troponin I (High Sensitivity): 3 ng/L (ref <18)
Troponin I (High Sensitivity): 3 ng/L (ref <18)

## 2020-10-27 LAB — RESP PANEL BY RT-PCR (FLU A&B, COVID) ARPGX2
Influenza A by PCR: NEGATIVE
Influenza B by PCR: NEGATIVE
SARS Coronavirus 2 by RT PCR: NEGATIVE

## 2020-10-27 MED ORDER — SODIUM CHLORIDE 0.9 % IV BOLUS
1000.0000 mL | Freq: Once | INTRAVENOUS | Status: AC
  Administered 2020-10-27: 1000 mL via INTRAVENOUS

## 2020-10-27 NOTE — H&P (Signed)
History and Physical  Diane Torres:[id number] DOB: [birth year] DOA: 10/27/2020  Referring physician: Milton Ferguson, MD PCP: Celene Squibb, MD  Patient coming from: Home  Chief Complaint: Loss of consciousness  HPI: Diane Torres is a 80 y.o. female with medical history significant for diverticulitis, GERD, hypothyroidism, CKD stage 3A/3B and chronic back pain who presents to the emergency department due to passing out twice at home.  Patient states that she got an injection from her orthopedic surgeon in the back on Wednesday (9/21) due to the chronic back pain.  She requested husband's help after getting out of the shower few hours PTA and she states that she passed out for about 5 minutes on recheck, EMS was activated, on arrival of EMS, she passed out again for about 2 minutes when she was being placed on the stretcher.  She complained of having a back pain prior to passing out the first time, however, she had no back pain prior to the second episode of passing out.  She denies chest pain, shortness of breath, fever, chills, headache, blurry vision   ED Course: In the emergency department, temperature on arrival was 97.21F, pulse 58, BP 135/61, but other vital signs were within normal range.  Work-up in the ED showed leukocytosis, macrocytic anemia, mild hyponatremia, BUN/creatinine 30/1.52 (baseline creatinine at 1.1-1.4).  Influenza A, B, SARS coronavirus 2 was negative. CT head without contrast showed no acute intracranial abnormality, but showed 2.2 cm pituitary mass and 2.5 cm right frontal extra-axial mass most in keeping with a meningioma demonstrating minimal mass effect upon the right frontal lobe. Neurosurgeon (Dr. Christella Noa) was consulted and stated that the MRI can be done as outpatient in the nonemergent study settings.  IV hydration was provided.  Hospitalist was asked to admit patient for further evaluation and management.  Review of Systems: Constitutional: Negative for chills  and fever.  HENT: Negative for ear pain and sore throat.   Eyes: Negative for pain and visual disturbance.  Respiratory: Negative for cough, chest tightness and shortness of breath.   Cardiovascular: Positive for passing out.  Negative for chest pain and palpitations.  Gastrointestinal: Negative for abdominal pain and vomiting.  Endocrine: Negative for polyphagia and polyuria.  Genitourinary: Negative for decreased urine volume, dysuria, enuresis Musculoskeletal: Positive for chronic back pain. Skin: Negative for color change and rash.  Allergic/Immunologic: Negative for immunocompromised state.  Neurological: Negative for tremors, syncope, speech difficulty Hematological: Does not bruise/bleed easily.  All other systems reviewed and are negative  Past Medical History:  Diagnosis Date   CAP (community acquired pneumonia) 05/2018   Diverticulitis    CT verified in 2015, 2018, 2019, 2020   GERD (gastroesophageal reflux disease)    Hypothyroidism    Impaired fasting glucose    Mixed hyperlipidemia    Past Surgical History:  Procedure Laterality Date   COLONOSCOPY     COLONOSCOPY N/A 12/12/2015   Dr. Gala Romney: one 3 mm tubular adenoma in rectum s/p removal. Diverticulosis in entire colon. Internal hemorrhoids.    ESOPHAGOGASTRODUODENOSCOPY N/A 03/19/2016   Dr. Gala Romney: LA Grade A esophagitis, empiric dilation, normal stomach, normal duodenum   Left lobe thyroidectomy     MALONEY DILATION N/A 03/19/2016   Procedure: Venia Minks DILATION;  Surgeon: Daneil Dolin, MD;  Location: [facility];  Service: Endoscopy;  Laterality: N/A;   POLYPECTOMY  12/12/2015   Procedure: POLYPECTOMY;  Surgeon: Daneil Dolin, MD;  Location: [facility];  Service: Endoscopy;;  colon  RECTOCELE REPAIR     TOTAL ABDOMINAL HYSTERECTOMY W/ BILATERAL SALPINGOOPHORECTOMY      Social History:  reports that she has never smoked. She has never used smokeless tobacco. She reports that she does not drink alcohol and does  not use drugs.   Allergies  Allergen Reactions   Bee Venom Anaphylaxis    Family History  Problem Relation Age of Onset   CAD Brother        2 brothers with CAD in their 33s   Crohn's disease Brother        had colon resection   Pancreatic cancer Brother    CAD Father    Heart failure Mother    Diabetes Mellitus II Mother    Colon cancer Neg Hx     Prior to Admission medications   Medication Sig Start Date End Date Taking? Authorizing Provider  cetirizine (ZYRTEC) 10 MG tablet Take 10 mg by mouth daily. 08/04/19   [provider]  fluconazole (DIFLUCAN) 150 MG tablet Take 1 tablet (150 mg total) by mouth daily as needed (yeast infection). Repeat if needed 12/05/18   Murlean Iba, MD  losartan (COZAAR) 100 MG tablet Take 100 mg by mouth daily.    [provider]  oxyCODONE-acetaminophen (PERCOCET/ROXICET) 5-325 MG tablet Take 1 tablet by mouth every 4 (four) hours as needed. Patient not taking: Reported on 10/22/2020 08/05/20   Triplett, Tammy, PA-C  pantoprazole (PROTONIX) 40 MG tablet TAKE ONE TABLET BY MOUTH ONCE DAILY. Patient taking differently: Take 40 mg by mouth daily. 08/07/16   Mahala Menghini, PA-C  simvastatin (ZOCOR) 40 MG tablet Take 40 mg by mouth at bedtime.     [provider]    Physical Exam: BP 132/65   Pulse 66   Temp (!) 97.4 F (36.3 C) (Rectal)   Resp 13   Ht 5\' 1"  (1.549 m)   Wt 68 kg   SpO2 95%   BMI 28.34 kg/m   General: 80 y.o. year-old female well developed well nourished in no acute distress.  Alert and oriented x3. HEENT: NCAT, EOMI Neck: Supple, trachea medial Cardiovascular: Regular rate and rhythm with no rubs or gallops.  No thyromegaly or JVD noted.  No lower extremity edema. 2/4 pulses in all 4 extremities. Respiratory: Clear to auscultation with no wheezes or rales. Good inspiratory effort. Abdomen: Soft, nontender nondistended with normal bowel sounds x4 quadrants. Muskuloskeletal: No cyanosis, clubbing  or edema noted bilaterally Neuro: CN II-XII intact, strength 5/5 x 4, sensation, reflexes intact Skin: No ulcerative lesions noted or rashes Psychiatry: Judgement and insight appear normal. Mood is appropriate for condition and setting          Labs on Admission:  Basic Metabolic Panel: Recent Labs  Lab 10/27/20 2021  NA 134*  K 4.1  CL 105  CO2 18*  GLUCOSE 116*  BUN 30*  CREATININE 1.52*  CALCIUM 8.9   Liver Function Tests: Recent Labs  Lab 10/27/20 2021  AST 21  ALT 22  ALKPHOS 60  BILITOT 0.3  PROT 6.6  ALBUMIN 3.5   No results for input(s): LIPASE, AMYLASE in the last 168 hours. No results for input(s): AMMONIA in the last 168 hours. CBC: Recent Labs  Lab 10/27/20 2021  WBC 12.0*  HGB 11.2*  HCT 35.7*  MCV 100.8*  PLT 308   Cardiac Enzymes: No results for input(s): CKTOTAL, CKMB, CKMBINDEX, TROPONINI in the last 168 hours.  BNP (last 3 results) No results for input(s):  BNP in the last 8760 hours.  ProBNP (last 3 results) No results for input(s): PROBNP in the last 8760 hours.  CBG: No results for input(s): GLUCAP in the last 168 hours.  Radiological Exams on Admission: CT HEAD WO CONTRAST (5MM)  Result Date: 10/27/2020 CLINICAL DATA:  Cerebral hemorrhage suspected, loss of consciousness, syncope. EXAM: CT HEAD WITHOUT CONTRAST TECHNIQUE: Contiguous axial images were obtained from the base of the skull through the vertex without intravenous contrast. COMPARISON:  None. FINDINGS: Brain: Partially calcified hyperdense extra-axial mass is seen along the right frontal para falcine calvarium demonstrating minimal mass effect upon the right frontal lobe most in keeping with a meningioma. This measures 2.5 x 2.4 x 2.6 cm in greatest dimension. A pituitary mass is present measuring 1.8 x 1.9 x 2.2 cm expanding the sella turcica and demonstrating mass effect upon the inferior aspect of the optic chiasm. While this most likely represents a a pituitary macro  adenoma, this is not optimally characterized on this examination. No acute intracranial hemorrhage or infarct. No abnormal intra or extra-axial fluid collection. Ventricular size is normal. Cerebellum is unremarkable. Vascular: No asymmetric hyperdense vasculature at the skull base. Skull: Intact Sinuses/Orbits: The orbits and paranasal sinuses are clear. Other: Mastoid air cells and middle ear cavities are clear. IMPRESSION: No acute intracranial abnormality. 2.2 cm pituitary mass. Contrast enhanced MRI examination is recommended for further characterization. 2.5 cm right frontal extra-axial mass most in keeping with a meningioma demonstrating minimal mass effect upon the right frontal lobe. Electronically Signed   By: Fidela Salisbury M.D.   On: 10/27/2020 20:51    EKG: I independently viewed the EKG done and my findings are as followed: Normal sinus rhythm at a rate of 71 bpm  Assessment/Plan Present on Admission:  Syncope  GERD (gastroesophageal reflux disease)  CKD (chronic kidney disease) stage 3, GFR 30-59 ml/min (HCC)  Mixed hyperlipidemia  Principal Problem:   Syncope Active Problems:   Mixed hyperlipidemia   GERD (gastroesophageal reflux disease)   Leukocytosis   CKD (chronic kidney disease) stage 3, GFR 30-59 ml/min (HCC)   Pituitary mass (HCC)   Intracranial mass   Macrocytic anemia   Chronic back pain   Essential hypertension  Syncope Patient had 2 witnessed syncopal episodes Continue telemetry and watch for arrhythmias Troponins x2 was flat at 3.  Orthostatic vital signs will be done. Continue IV hydration EKG showed normal sinus rhythm at a rate of 71 bpm Echocardiogram will be done to rule out significant aortic stenosis or other outflow obstruction, and also to evaluate EF and to rule out segmental/Regional wall motion abnormalities.  Carotid artery Dopplers will be done to rule out hemodynamically significant stenosis  Pituitary mass Intracranial mass CT head  without contrast showed no acute intracranial abnormality, but showed 2.2 cm pituitary mass and 2.5 cm right frontal extra-axial mass suspected to be meningioma Neurosurgeon consulted states that MRI of brain can be done as an outpatient in a nonemergent setting per ED physician  Leukocytosis WBC 12, no obvious sign for any acute infectious process at this time Continue to monitor WBC with morning labs  Macrocytic anemia MCV 100.8; vitamin B12 and folate levels will be checked  Chronic back pain Stable, continue Tylenol as needed Patient follows with an orthopedic surgeon  Essential hypertension Continue losartan  Hyperlipidemia Continue simvastatin  GERD Continue Protonix  CKD stage IIIa/IIIb BUN/creatinine 30/1.52 (baseline creatinine at 1.1-1.4).  Renally adjust medications, avoid nephrotoxic agents/dehydration/hypotension  DVT prophylaxis: Lovenox  Code Status:  Full code  Family Communication: None at bedside  Disposition Plan:  Patient is from:                        home Anticipated DC to:                   SNF or family members home Anticipated DC date:               2-3 days Anticipated DC barriers:          Patient requires inpatient management for further work-up on syncope  Consults called: None  Admission status: Observation    Bernadette Hoit MD [facility]  10/28/2020, 2:04 AM

## 2020-10-27 NOTE — ED Notes (Signed)
Patient transported to CT 

## 2020-10-27 NOTE — ED Provider Notes (Signed)
[facility] EMERGENCY DEPARTMENT Provider Note   CSN: [id number] Arrival date & time: 10/27/20  1952     History Chief Complaint  Patient presents with   Loss of Consciousness    Diane Torres is a 80 y.o. female.  Patient's husband states that the patient got out of the shower and he was trying to help her and then she passed out in the chair for about 5 minutes.  After she awoke the paramedics were putting her on the stretcher and she passed out again for about 2 minutes.  Patient has a history of high hypothyroidism  The history is provided by the patient and medical records. No language interpreter was used.  Loss of Consciousness Episode history:  Multiple Most recent episode:  Today Timing:  Intermittent Progression:  Resolved Chronicity:  New Context: not blood draw   Witnessed: no   Relieved by:  Nothing Worsened by:  Nothing Ineffective treatments:  None tried Associated symptoms: no anxiety, no chest pain, no headaches and no seizures       Past Medical History:  Diagnosis Date   CAP (community acquired pneumonia) 05/2018   Diverticulitis    CT verified in 2015, 2018, 2019, 2020   GERD (gastroesophageal reflux disease)    Hypothyroidism    Impaired fasting glucose    Mixed hyperlipidemia     Patient Active Problem List   Diagnosis Date Noted   Syncope 10/27/2020   Diarrhea 12/02/2018   Diverticulosis 12/02/2018   Acute diverticulitis 06/30/2017   Rectal bleeding 06/30/2017   CKD (chronic kidney disease) stage 3, GFR 30-59 ml/min 06/30/2017   LLQ pain 12/23/2016   GERD (gastroesophageal reflux disease) 06/16/2016   Gastroesophageal reflux disease with esophagitis 06/16/2016   Precordial pain 03/10/2013   Impaired fasting glucose 03/09/2013   Mixed hyperlipidemia 03/09/2013   Hypothyroidism 03/09/2013    Past Surgical History:  Procedure Laterality Date   COLONOSCOPY     COLONOSCOPY N/A 12/12/2015   Dr. Gala Romney: one 3 mm tubular adenoma in rectum  s/p removal. Diverticulosis in entire colon. Internal hemorrhoids.    ESOPHAGOGASTRODUODENOSCOPY N/A 03/19/2016   Dr. Gala Romney: LA Grade A esophagitis, empiric dilation, normal stomach, normal duodenum   Left lobe thyroidectomy     MALONEY DILATION N/A 03/19/2016   Procedure: Venia Minks DILATION;  Surgeon: Daneil Dolin, MD;  Location: [facility];  Service: Endoscopy;  Laterality: N/A;   POLYPECTOMY  12/12/2015   Procedure: POLYPECTOMY;  Surgeon: Daneil Dolin, MD;  Location: [facility];  Service: Endoscopy;;  colon   RECTOCELE REPAIR     TOTAL ABDOMINAL HYSTERECTOMY W/ BILATERAL SALPINGOOPHORECTOMY       OB History     Gravida  2   Para  2   Term  2   Preterm      AB      Living         SAB      IAB      Ectopic      Multiple      Live Births              Family History  Problem Relation Age of Onset   CAD Brother        2 brothers with CAD in their 23s   Crohn's disease Brother        had colon resection   Pancreatic cancer Brother    CAD Father    Heart failure Mother    Diabetes Mellitus  II Mother    Colon cancer Neg Hx     Social History   Tobacco Use   Smoking status: Never   Smokeless tobacco: Never  Vaping Use   Vaping Use: Never used  Substance Use Topics   Alcohol use: No   Drug use: No    Home Medications Prior to Admission medications   Medication Sig Start Date End Date Taking? Authorizing Provider  cetirizine (ZYRTEC) 10 MG tablet Take 10 mg by mouth daily. 08/04/19   [provider]  fluconazole (DIFLUCAN) 150 MG tablet Take 1 tablet (150 mg total) by mouth daily as needed (yeast infection). Repeat if needed 12/05/18   Murlean Iba, MD  losartan (COZAAR) 100 MG tablet Take 100 mg by mouth daily.    [provider]  oxyCODONE-acetaminophen (PERCOCET/ROXICET) 5-325 MG tablet Take 1 tablet by mouth every 4 (four) hours as needed. Patient not taking: Reported on 10/22/2020 08/05/20   Triplett, Tammy, PA-C   pantoprazole (PROTONIX) 40 MG tablet TAKE ONE TABLET BY MOUTH ONCE DAILY. Patient taking differently: Take 40 mg by mouth daily. 08/07/16   Mahala Menghini, PA-C  simvastatin (ZOCOR) 40 MG tablet Take 40 mg by mouth at bedtime.     [provider]    Allergies    Bee venom  Review of Systems   Review of Systems  Constitutional:  Negative for appetite change and fatigue.  HENT:  Negative for congestion, ear discharge and sinus pressure.   Eyes:  Negative for discharge.  Respiratory:  Negative for cough.   Cardiovascular:  Positive for syncope. Negative for chest pain.  Gastrointestinal:  Negative for abdominal pain and diarrhea.  Genitourinary:  Negative for frequency and hematuria.  Musculoskeletal:  Negative for back pain.  Skin:  Negative for rash.  Neurological:  Positive for syncope. Negative for seizures and headaches.  Psychiatric/Behavioral:  Negative for hallucinations.    Physical Exam Updated Vital Signs BP 136/64   Pulse 60   Temp (!) 97.4 F (36.3 C) (Rectal)   Resp 19   Ht 5\' 1"  (1.549 m)   Wt 68 kg   SpO2 96%   BMI 28.34 kg/m   Physical Exam Vitals and nursing note reviewed.  Constitutional:      Appearance: She is well-developed.  HENT:     Head: Normocephalic.     Nose: Nose normal.  Eyes:     General: No scleral icterus.    Conjunctiva/sclera: Conjunctivae normal.  Neck:     Thyroid: No thyromegaly.  Cardiovascular:     Rate and Rhythm: Normal rate and regular rhythm.     Heart sounds: No murmur heard.   No friction rub. No gallop.  Pulmonary:     Breath sounds: No stridor. No wheezing or rales.  Chest:     Chest wall: No tenderness.  Abdominal:     General: There is no distension.     Tenderness: There is no abdominal tenderness. There is no rebound.  Musculoskeletal:        General: Normal range of motion.     Cervical back: Neck supple.  Lymphadenopathy:     Cervical: No cervical adenopathy.  Skin:    Findings: No erythema  or rash.  Neurological:     Mental Status: She is alert and oriented to person, place, and time.     Motor: No abnormal muscle tone.     Coordination: Coordination normal.  Psychiatric:        Behavior: Behavior  normal.    ED Results / Procedures / Treatments   Labs (all labs ordered are listed, but only abnormal results are displayed) Labs Reviewed  BASIC METABOLIC PANEL - Abnormal; Notable for the following components:      Result Value   Sodium 134 (*)    CO2 18 (*)    Glucose, Bld 116 (*)    BUN 30 (*)    Creatinine, Ser 1.52 (*)    GFR, Estimated 34 (*)    All other components within normal limits  CBC - Abnormal; Notable for the following components:   WBC 12.0 (*)    RBC 3.54 (*)    Hemoglobin 11.2 (*)    HCT 35.7 (*)    MCV 100.8 (*)    RDW 15.9 (*)    All other components within normal limits  HEPATIC FUNCTION PANEL - Abnormal; Notable for the following components:   Indirect Bilirubin 0.2 (*)    All other components within normal limits  RESP PANEL BY RT-PCR (FLU A&B, COVID) ARPGX2  URINALYSIS, ROUTINE W REFLEX MICROSCOPIC  TROPONIN I (HIGH SENSITIVITY)  TROPONIN I (HIGH SENSITIVITY)    EKG EKG Interpretation  Date/Time:  Saturday October 27 2020 20:02:06 EDT Ventricular Rate:  71 PR Interval:  177 QRS Duration: 94 QT Interval:  405 QTC Calculation: 441 R Axis:   30 Text Interpretation: Sinus rhythm Low voltage, precordial leads Confirmed by Milton Ferguson [phone]) on 10/27/2020 9:08:32 PM  Radiology CT HEAD WO CONTRAST (5MM)  Result Date: 10/27/2020 CLINICAL DATA:  Cerebral hemorrhage suspected, loss of consciousness, syncope. EXAM: CT HEAD WITHOUT CONTRAST TECHNIQUE: Contiguous axial images were obtained from the base of the skull through the vertex without intravenous contrast. COMPARISON:  None. FINDINGS: Brain: Partially calcified hyperdense extra-axial mass is seen along the right frontal para falcine calvarium demonstrating minimal mass effect  upon the right frontal lobe most in keeping with a meningioma. This measures 2.5 x 2.4 x 2.6 cm in greatest dimension. A pituitary mass is present measuring 1.8 x 1.9 x 2.2 cm expanding the sella turcica and demonstrating mass effect upon the inferior aspect of the optic chiasm. While this most likely represents a a pituitary macro adenoma, this is not optimally characterized on this examination. No acute intracranial hemorrhage or infarct. No abnormal intra or extra-axial fluid collection. Ventricular size is normal. Cerebellum is unremarkable. Vascular: No asymmetric hyperdense vasculature at the skull base. Skull: Intact Sinuses/Orbits: The orbits and paranasal sinuses are clear. Other: Mastoid air cells and middle ear cavities are clear. IMPRESSION: No acute intracranial abnormality. 2.2 cm pituitary mass. Contrast enhanced MRI examination is recommended for further characterization. 2.5 cm right frontal extra-axial mass most in keeping with a meningioma demonstrating minimal mass effect upon the right frontal lobe. Electronically Signed   By: Fidela Salisbury M.D.   On: 10/27/2020 20:51    Procedures Procedures   Medications Ordered in ED Medications  sodium chloride 0.9 % bolus 1,000 mL (has no administration in time range)    ED Course  I have reviewed the triage vital signs and the nursing notes.  Pertinent labs & imaging results that were available during my care of the patient were reviewed by me and considered in my medical decision making (see chart for details). CT scan shows possible pituitary tumor and a meningioma.  I spoke to the neurosurgeon Dr. Christella Noa and he stated that the MRI can be done as an outpatient is nonurgent study to get.  Patient will be admitted  to medicine for work-up syncope   MDM Rules/Calculators/A&P                           Patient with syncope x2.  She is admitted to medicine and hydrated Final Clinical Impression(s) / ED Diagnoses Final diagnoses:   Syncope and collapse    Rx / DC Orders ED Discharge Orders     None        Milton Ferguson, MD 10/29/20 1108
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
## 2020-10-28 ENCOUNTER — Observation Stay (HOSPITAL_COMMUNITY): Payer: Medicare Other

## 2020-10-28 ENCOUNTER — Observation Stay (HOSPITAL_BASED_OUTPATIENT_CLINIC_OR_DEPARTMENT_OTHER): Payer: Medicare Other

## 2020-10-28 DIAGNOSIS — R55 Syncope and collapse: Secondary | ICD-10-CM

## 2020-10-28 DIAGNOSIS — G8929 Other chronic pain: Secondary | ICD-10-CM

## 2020-10-28 DIAGNOSIS — N1831 Chronic kidney disease, stage 3a: Secondary | ICD-10-CM | POA: Diagnosis not present

## 2020-10-28 DIAGNOSIS — M5440 Lumbago with sciatica, unspecified side: Secondary | ICD-10-CM | POA: Diagnosis not present

## 2020-10-28 DIAGNOSIS — R900 Intracranial space-occupying lesion found on diagnostic imaging of central nervous system: Secondary | ICD-10-CM

## 2020-10-28 DIAGNOSIS — D539 Nutritional anemia, unspecified: Secondary | ICD-10-CM

## 2020-10-28 DIAGNOSIS — I10 Essential (primary) hypertension: Secondary | ICD-10-CM

## 2020-10-28 DIAGNOSIS — N183 Chronic kidney disease, stage 3 unspecified: Secondary | ICD-10-CM | POA: Diagnosis not present

## 2020-10-28 DIAGNOSIS — E236 Other disorders of pituitary gland: Secondary | ICD-10-CM

## 2020-10-28 LAB — COMPREHENSIVE METABOLIC PANEL
ALT: 20 U/L (ref 0–44)
AST: 18 U/L (ref 15–41)
Albumin: 3.2 g/dL — ABNORMAL LOW (ref 3.5–5.0)
Alkaline Phosphatase: 60 U/L (ref 38–126)
Anion gap: 6 (ref 5–15)
BUN: 24 mg/dL — ABNORMAL HIGH (ref 8–23)
CO2: 24 mmol/L (ref 22–32)
Calcium: 9 mg/dL (ref 8.9–10.3)
Chloride: 107 mmol/L (ref 98–111)
Creatinine, Ser: 1.04 mg/dL — ABNORMAL HIGH (ref 0.44–1.00)
GFR, Estimated: 54 mL/min — ABNORMAL LOW (ref >60)
Glucose, Bld: 95 mg/dL (ref 70–99)
Potassium: 4.4 mmol/L (ref 3.5–5.1)
Sodium: 137 mmol/L (ref 135–145)
Total Bilirubin: 0.4 mg/dL (ref 0.3–1.2)
Total Protein: 6.2 g/dL — ABNORMAL LOW (ref 6.5–8.1)

## 2020-10-28 LAB — CBC
HCT: 33.4 % — ABNORMAL LOW (ref 36.0–46.0)
Hemoglobin: 10.8 g/dL — ABNORMAL LOW (ref 12.0–15.0)
MCH: 31.2 pg (ref 26.0–34.0)
MCHC: 32.3 g/dL (ref 30.0–36.0)
MCV: 96.5 fL (ref 80.0–100.0)
Platelets: 303 10*3/uL (ref 150–400)
RBC: 3.46 MIL/uL — ABNORMAL LOW (ref 3.87–5.11)
RDW: 15.9 % — ABNORMAL HIGH (ref 11.5–15.5)
WBC: 12.2 10*3/uL — ABNORMAL HIGH (ref 4.0–10.5)
nRBC: 0 % (ref 0.0–0.2)

## 2020-10-28 LAB — ECHOCARDIOGRAM COMPLETE
Area-P 1/2: 3.37 cm2
Height: 61 in
P 1/2 time: 498 msec
S' Lateral: 2 cm
Single Plane A4C EF: 57.2 %
Weight: 2400 oz

## 2020-10-28 LAB — MAGNESIUM: Magnesium: 2.3 mg/dL (ref 1.7–2.4)

## 2020-10-28 LAB — URINALYSIS, ROUTINE W REFLEX MICROSCOPIC
Bacteria, UA: NONE SEEN
Bilirubin Urine: NEGATIVE
Glucose, UA: NEGATIVE mg/dL
Hgb urine dipstick: NEGATIVE
Ketones, ur: NEGATIVE mg/dL
Nitrite: NEGATIVE
Protein, ur: NEGATIVE mg/dL
Specific Gravity, Urine: 1.005 (ref 1.005–1.030)
pH: 6 (ref 5.0–8.0)

## 2020-10-28 LAB — APTT: aPTT: 24 seconds (ref 24–36)

## 2020-10-28 LAB — PROTIME-INR
INR: 1 (ref 0.8–1.2)
Prothrombin Time: 13.3 seconds (ref 11.4–15.2)

## 2020-10-28 LAB — VITAMIN B12: Vitamin B-12: 278 pg/mL (ref 180–914)

## 2020-10-28 LAB — FOLATE: Folate: 17.2 ng/mL (ref >5.9)

## 2020-10-28 LAB — PHOSPHORUS: Phosphorus: 4 mg/dL (ref 2.5–4.6)

## 2020-10-28 MED ORDER — LOSARTAN POTASSIUM 50 MG PO TABS
100.0000 mg | ORAL_TABLET | Freq: Every day | ORAL | Status: DC
  Administered 2020-10-28 – 2020-10-29 (×2): 100 mg via ORAL
  Filled 2020-10-28 (×2): qty 2

## 2020-10-28 MED ORDER — PANTOPRAZOLE SODIUM 40 MG PO TBEC
40.0000 mg | DELAYED_RELEASE_TABLET | Freq: Every day | ORAL | Status: DC
  Administered 2020-10-28 – 2020-10-29 (×2): 40 mg via ORAL
  Filled 2020-10-28 (×2): qty 1

## 2020-10-28 MED ORDER — ACETAMINOPHEN 325 MG PO TABS: 650.0000 mg | ORAL_TABLET | Freq: Four times a day (QID) | ORAL | Status: DC | PRN

## 2020-10-28 MED ORDER — SIMVASTATIN 20 MG PO TABS
40.0000 mg | ORAL_TABLET | Freq: Every day | ORAL | Status: DC
  Administered 2020-10-28: 40 mg via ORAL
  Filled 2020-10-28: qty 2

## 2020-10-28 MED ORDER — ENOXAPARIN SODIUM 40 MG/0.4ML IJ SOSY
40.0000 mg | PREFILLED_SYRINGE | INTRAMUSCULAR | Status: DC
  Administered 2020-10-28 – 2020-10-29 (×2): 40 mg via SUBCUTANEOUS
  Filled 2020-10-28 (×2): qty 0.4

## 2020-10-28 MED ORDER — ENOXAPARIN SODIUM 30 MG/0.3ML IJ SOSY
30.0000 mg | PREFILLED_SYRINGE | INTRAMUSCULAR | Status: DC
  Filled 2020-10-28: qty 0.3

## 2020-10-28 MED ORDER — SODIUM CHLORIDE 0.9 % IV SOLN: INTRAVENOUS | Status: DC

## 2020-10-28 MED ORDER — MELATONIN 3 MG PO TABS
6.0000 mg | ORAL_TABLET | Freq: Once | ORAL | Status: AC
  Administered 2020-10-28: 6 mg via ORAL
  Filled 2020-10-28: qty 2

## 2020-10-28 MED ORDER — SODIUM CHLORIDE 0.9 % IV SOLN: Freq: Once | INTRAVENOUS | Status: DC

## 2020-10-28 NOTE — Progress Notes (Signed)
*  PRELIMINARY RESULTS* Echocardiogram 2D Echocardiogram has been performed.  Diane Torres 10/28/2020, 1:01 PM

## 2020-10-28 NOTE — Care Management Obs Status (Signed)
[place] NOTIFICATION   Patient Details  Name: NEOLA WORRALL MRN: [id number] Date of Birth: 1940/04/18   Medicare Observation Status Notification Given:  Yes    Natasha Bence, LCSW 10/28/2020, 5:35 PM

## 2020-10-28 NOTE — Progress Notes (Signed)
PROGRESS NOTE    Diane Torres  EUM:[id number] DOB: 08-14-1940 DOA: 10/27/2020 PCP: Celene Squibb, MD   Brief Narrative: The patient is an 80 year old Caucasian female with past medical history significant for but #2 diverticulitis, GERD, hypothyroidism, history of CKD stage IIIa/IIIb, chronic back pain as well as other comorbidities who presented to emergency room due to passing out twice at home.  The patient states that she got an injection from orthopedic surgeon in her back on Wednesday, 10/24/2020 due to chronic back pain and she requested arousal help after getting a shower a few hours prior to admission and states that she passed out for about 5 minutes and on recheck EMS was activated and arrival EMS she passed out again for about 2 minutes and was placed on a stretcher.  She complained of having back pain prior to passing out the first time however she had no back pain prior to the second episode of passing.  She denies any chest pain, shortness breath, fevers, chills, headache or blurry vision.  In the ED she had normal vital signs but work-up showed a leukocytosis, macrocytic anemia and mild hyponatremia.  She had a slightly elevated creatinine and she underwent a head CT without contrast which showed no acute intracranial abnormality but did show 2.2 cm pituitary mass and 2.5 cm left frontal extra-axial mass most in keeping with a meningioma demonstrating minimal mass-effect upon the right frontal lobe.  Neurosurgery was consulted and felt that the MRI could be done in outpatient setting as a nonemergent study.  She is given IV fluid hydration and hospitalist was asked admit the patient for further evaluation management. She is being hospitalized for syncopal work-up and still undergoing evaluation and awaiting PT and OT evaluation   Assessment & Plan:   Principal Problem:   Syncope Active Problems:   Mixed hyperlipidemia   GERD (gastroesophageal reflux disease)   Leukocytosis   CKD  (chronic kidney disease) stage 3, GFR 30-59 ml/min (HCC)   Pituitary mass (HCC)   Intracranial mass   Macrocytic anemia   Chronic back pain   Essential hypertension    Syncope -Patient had 2 witnessed syncopal episodes -Continue telemetry and watch for arrhythmias -Troponins x2 was flat at 3.  -Orthostatic vital signs will be done and she is not Orthostatic  -Continued IV hydration -EKG showed normal sinus rhythm at a rate of 71 bpm -Echocardiogram will be done to rule out significant aortic stenosis or other outflow obstruction, and also to evaluate EF and to rule out segmental/Regional wall motion abnormalities.  -ECHO showed "Ejection fraction, by estimation, is 60 to 65%. The  left ventricle has normal function. The left ventricle has no regional  wall motion abnormalities. Left ventricular diastolic parameters were  normal." -Carotid artery Dopplers will be done to rule out hemodynamically significant stenosis and showed "Minimal amount of left-sided atherosclerotic plaque, not resulting in a hemodynamically  significant stenosis within the left internal carotid artery. Unremarkable sonographic evaluation of the right carotid system."  Pituitary mass Intracranial mass -CT head without contrast showed no acute intracranial abnormality, but showed 2.2 cm pituitary mass and 2.5 cm right frontal extra-axial mass suspected to be meningioma -Neurosurgeon consulted states that MRI of brain can be done as an outpatient in a nonemergent setting per ED physician  Leukocytosis -WBC 12, no obvious sign for any acute infectious process at this time.  Repeat CBC showed a WBC of 12.2 -Continue to monitor and repeat CBC in the AM  Macrocytic Anemia -MCV 100.8; vitamin B12 and folate levels were checked and were 278 and 17.2 -Patient's hemoglobin/hematocrit went from 11.2/35.7 is now 10.8/33.4 -Check Anemia Panel in the AM -Continue to Monitor for S/Sx of Bleeding  -Repeat CBC in the AM    Chronic back pain -Stable, continue Tylenol as needed -Patient follows with an orthopedic surgeon  Essential hypertension -Continue losartan -Continue to Monitor BP per Protocol  Hyperlipidemia -Continue simvastatin  GERD -Continue Protonix  CKD stage IIIa/IIIb -BUN/creatinine 30/1.52 (baseline creatinine at 1.1-1.4).  -Patient's BUNs/creatinine improved with IV fluid hydration and is now 24/1.04 -Avoid nephrotoxic medications, contrast dyes, hypotension and renally adjust medications -Repeat CMP in a.m.  DVT prophylaxis: Enoxaparin 40 mg sq q24h Code Status: FULL CODE  Family Communication: Discussed with Husband at bedside  Disposition Plan: Pending PT OT evaluation anticipate discharging home in next 24 to 48 hours  Status is: Observation  The patient remains OBS appropriate and will d/c before 2 midnights.  Dispo: The patient is from: Home              Anticipated d/c is to:  TBD              Patient currently is not medically stable to d/c.   Difficult to place patient No  Consultants:  None  Procedures:  ECHOCARDIOGRAM  IMPRESSIONS     1. Left ventricular ejection fraction, by estimation, is 60 to 65%. The  left ventricle has normal function. The left ventricle has no regional  wall motion abnormalities. Left ventricular diastolic parameters were  normal.   2. Right ventricular systolic function is normal. The right ventricular  size is normal. There is normal pulmonary artery systolic pressure.   3. The pericardial effusion is posterior to the left ventricle.   4. The mitral valve is degenerative. Trivial mitral valve regurgitation.  No evidence of mitral stenosis. Moderate mitral annular calcification.   5. The aortic valve is tricuspid. Aortic valve regurgitation is mild.  Mild to moderate aortic valve sclerosis/calcification is present, without  any evidence of aortic stenosis.   6. The inferior vena cava is normal in size with greater than 50%   respiratory variability, suggesting right atrial pressure of 3 mmHg.   FINDINGS   Left Ventricle: Left ventricular ejection fraction, by estimation, is 60  to 65%. The left ventricle has normal function. The left ventricle has no  regional wall motion abnormalities. The left ventricular internal cavity  size was normal in size. There is   no left ventricular hypertrophy. Left ventricular diastolic parameters  were normal.   Right Ventricle: The right ventricular size is normal. No increase in  right ventricular wall thickness. Right ventricular systolic function is  normal. There is normal pulmonary artery systolic pressure. The tricuspid  regurgitant velocity is 2.67 m/s, and   with an assumed right atrial pressure of 3 mmHg, the estimated right  ventricular systolic pressure is 49.7 mmHg.   Left Atrium: Left atrial size was normal in size.   Right Atrium: Right atrial size was normal in size.   Pericardium: Trivial pericardial effusion is present. The pericardial  effusion is posterior to the left ventricle.   Mitral Valve: The mitral valve is degenerative in appearance. There is  moderate thickening of the mitral valve leaflet(s). There is moderate  calcification of the mitral valve leaflet(s). Moderate mitral annular  calcification. Trivial mitral valve  regurgitation. No evidence of mitral valve stenosis.   Tricuspid Valve: The tricuspid valve is normal  in structure. Tricuspid  valve regurgitation is mild . No evidence of tricuspid stenosis.   Aortic Valve: The aortic valve is tricuspid. Aortic valve regurgitation is  mild. Aortic regurgitation PHT measures 498 msec. Mild to moderate aortic  valve sclerosis/calcification is present, without any evidence of aortic  stenosis.   Pulmonic Valve: The pulmonic valve was normal in structure. Pulmonic valve  regurgitation is not visualized. No evidence of pulmonic stenosis.   Aorta: The aortic root is normal in size and  structure.   Venous: The inferior vena cava is normal in size with greater than 50%  respiratory variability, suggesting right atrial pressure of 3 mmHg.   IAS/Shunts: No atrial level shunt detected by color flow Doppler.      LEFT VENTRICLE  PLAX 2D  LVIDd:         3.70 cm     Diastology  LVIDs:         2.00 cm     LV e' medial:    6.85 cm/s  LV PW:         1.00 cm     LV E/e' medial:  12.2  LV IVS:        0.80 cm     LV e' lateral:   6.31 cm/s  LVOT diam:     2.20 cm     LV E/e' lateral: 13.3  LV SV:         81  LV SV Index:   49  LVOT Area:     3.80 cm     LV Volumes (MOD)  LV vol d, MOD A4C: 86.9 ml  LV vol s, MOD A4C: 37.2 ml  LV SV MOD A4C:     86.9 ml   RIGHT VENTRICLE  RV Basal diam:  3.30 cm  RV S prime:     11.50 cm/s  TAPSE (M-mode): 2.8 cm   LEFT ATRIUM             Index       RIGHT ATRIUM           Index  LA diam:        2.80 cm 1.68 cm/m  RA Area:     10.90 cm  LA Vol (A2C):   22.6 ml 13.52 ml/m RA Volume:   23.20 ml  13.88 ml/m  LA Vol (A4C):   31.9 ml 19.09 ml/m  LA Biplane Vol: 27.7 ml 16.57 ml/m   AORTIC VALVE  LVOT Vmax:   91.60 cm/s  LVOT Vmean:  65.600 cm/s  LVOT VTI:    0.214 m  AI PHT:      498 msec   MITRAL VALVE                TRICUSPID VALVE  MV Area (PHT): 3.37 cm     TR Peak grad:   28.5 mmHg  MV Decel Time: 225 msec     TR Vmax:        267.00 cm/s  MV E velocity: 83.90 cm/s  MV A velocity: 108.00 cm/s  SHUNTS  MV E/A ratio:  0.78         Systemic VTI:  0.21 m                              Systemic Diam: 2.20 cm   Antimicrobials:  Anti-infectives (From admission, onward)    None  Subjective: Seen and examined at bedside and thinks that she is doing better.  She denies any chest pain or shortness of breath.  Felt okay.  Still awaiting PT OT evaluation.  No other concerns or complaints at this time.  Objective: Vitals:   10/28/20 0120 10/28/20 0530 10/28/20 0950 10/28/20 1446  BP:  (!) 148/54  (!) 134/47  Pulse:  63   73  Resp:  18  18  Temp:  98.3 F (36.8 C)  98.5 F (36.9 C)  TempSrc:  Oral  Oral  SpO2: 95% 98% 97% 94%  Weight:      Height:        Intake/Output Summary (Last 24 hours) at 10/28/2020 1914 Last data filed at 10/28/2020 1700 Gross per 24 hour  Intake 2952.38 ml  Output 1600 ml  Net 1352.38 ml   Filed Weights   10/27/20 2005 10/27/20 2040  Weight: 31.8 kg 68 kg    Examination: Physical Exam:  Constitutional: WN/WD overweight Caucasian female currently in NAD and appears calm and comfortable Eyes: Lids and conjunctivae normal, sclerae anicteric  ENMT: External Ears, Nose appear normal. Grossly normal hearing.  Neck: Appears normal, supple, no cervical masses, normal ROM, no appreciable thyromegaly; no JVD Respiratory: Diminished to auscultation bilaterally, no wheezing, rales, rhonchi or crackles. Normal respiratory effort and patient is not tachypenic. No accessory muscle use.  Unlabored breathing Cardiovascular: RRR, no murmurs / rubs / gallops. S1 and S2 auscultated. No extremity edema. Abdomen: Soft, non-tender, distended secondary habitus. Bowel sounds positive.  GU: Deferred. Musculoskeletal: No clubbing / cyanosis of digits/nails. No joint deformity upper and lower extremities.  Skin: No rashes, lesions, ulcers. No induration; Warm and dry.  Neurologic: CN 2-12 grossly intact with no focal deficits. Romberg sign and cerebellar reflexes not assessed.  Psychiatric: Normal judgment and insight. Alert and oriented x 3. Normal mood and appropriate affect.   Data Reviewed: I have personally reviewed following labs and imaging studies  CBC: Recent Labs  Lab 10/27/20 2021 10/28/20 0504  WBC 12.0* 12.2*  HGB 11.2* 10.8*  HCT 35.7* 33.4*  MCV 100.8* 96.5  PLT 308 322   Basic Metabolic Panel: Recent Labs  Lab 10/27/20 2021 10/28/20 0504  NA 134* 137  K 4.1 4.4  CL 105 107  CO2 18* 24  GLUCOSE 116* 95  BUN 30* 24*  CREATININE 1.52* 1.04*  CALCIUM 8.9 9.0  MG   --  2.3  PHOS  --  4.0   GFR: Estimated Creatinine Clearance: 38.1 mL/min (A) (by C-G formula based on SCr of 1.04 mg/dL (H)). Liver Function Tests: Recent Labs  Lab 10/27/20 2021 10/28/20 0504  AST 21 18  ALT 22 20  ALKPHOS 60 60  BILITOT 0.3 0.4  PROT 6.6 6.2*  ALBUMIN 3.5 3.2*   No results for input(s): LIPASE, AMYLASE in the last 168 hours. No results for input(s): AMMONIA in the last 168 hours. Coagulation Profile: Recent Labs  Lab 10/28/20 0504  INR 1.0   Cardiac Enzymes: No results for input(s): CKTOTAL, CKMB, CKMBINDEX, TROPONINI in the last 168 hours. BNP (last 3 results) No results for input(s): PROBNP in the last 8760 hours. HbA1C: No results for input(s): HGBA1C in the last 72 hours. CBG: No results for input(s): GLUCAP in the last 168 hours. Lipid Profile: No results for input(s): CHOL, HDL, LDLCALC, TRIG, CHOLHDL, LDLDIRECT in the last 72 hours. Thyroid Function Tests: No results for input(s): TSH, T4TOTAL, FREET4, T3FREE, THYROIDAB in the last 72 hours. Anemia  Panel: Recent Labs    10/28/20 0504  VITAMINB12 278  FOLATE 17.2   Sepsis Labs: No results for input(s): PROCALCITON, LATICACIDVEN in the last 168 hours.  Recent Results (from the past 240 hour(s))  Resp Panel by RT-PCR (Flu A&B, Covid) Nasopharyngeal Swab     Status: None   Collection Time: 10/27/20  8:13 PM   Specimen: Nasopharyngeal Swab; Nasopharyngeal(NP) swabs in vial transport medium  Result Value Ref Range Status   SARS Coronavirus 2 by RT PCR NEGATIVE NEGATIVE Final    Comment: (NOTE) SARS-CoV-2 target nucleic acids are NOT DETECTED.  The SARS-CoV-2 RNA is generally detectable in upper respiratory specimens during the acute phase of infection. The lowest concentration of SARS-CoV-2 viral copies this assay can detect is 138 copies/mL. A negative result does not preclude SARS-Cov-2 infection and should not be used as the sole basis for treatment or other patient management  decisions. A negative result may occur with  improper specimen collection/handling, submission of specimen other than nasopharyngeal swab, presence of viral mutation(s) within the areas targeted by this assay, and inadequate number of viral copies(<138 copies/mL). A negative result must be combined with clinical observations, patient history, and epidemiological information. The expected result is Negative.  Fact Sheet for Patients:  EntrepreneurPulse.com.au  Fact Sheet for Healthcare Providers:  IncredibleEmployment.be  This test is no t yet approved or cleared by the [place] FDA and  has been authorized for detection and/or diagnosis of SARS-CoV-2 by FDA under an Emergency Use Authorization (EUA). This EUA will remain  in effect (meaning this test can be used) for the duration of the COVID-19 declaration under Section 564(b)(1) of the Act, 21 U.S.C.section 360bbb-3(b)(1), unless the authorization is terminated  or revoked sooner.       Influenza A by PCR NEGATIVE NEGATIVE Final   Influenza B by PCR NEGATIVE NEGATIVE Final    Comment: (NOTE) The Xpert Xpress SARS-CoV-2/FLU/RSV plus assay is intended as an aid in the diagnosis of influenza from Nasopharyngeal swab specimens and should not be used as a sole basis for treatment. Nasal washings and aspirates are unacceptable for Xpert Xpress SARS-CoV-2/FLU/RSV testing.  Fact Sheet for Patients: EntrepreneurPulse.com.au  Fact Sheet for Healthcare Providers: IncredibleEmployment.be  This test is not yet approved or cleared by the [place] FDA and has been authorized for detection and/or diagnosis of SARS-CoV-2 by FDA under an Emergency Use Authorization (EUA). This EUA will remain in effect (meaning this test can be used) for the duration of the COVID-19 declaration under Section 564(b)(1) of the Act, 21 U.S.C. section 360bbb-3(b)(1), unless the  authorization is terminated or revoked.  Performed at [facility], [address] [place], [place] [address]     RN Pressure Injury Documentation:     Estimated body mass index is 28.34 kg/m as calculated from the following:   Height as of this encounter: 5\' 1"  (1.549 m).   Weight as of this encounter: 68 kg.  Malnutrition Type:   Malnutrition Characteristics:   Nutrition Interventions:     Radiology Studies: CT HEAD WO CONTRAST (5MM)  Result Date: 10/27/2020 CLINICAL DATA:  Cerebral hemorrhage suspected, loss of consciousness, syncope. EXAM: CT HEAD WITHOUT CONTRAST TECHNIQUE: Contiguous axial images were obtained from the base of the skull through the vertex without intravenous contrast. COMPARISON:  None. FINDINGS: Brain: Partially calcified hyperdense extra-axial mass is seen along the right frontal para falcine calvarium demonstrating minimal mass effect upon the right frontal lobe most in keeping with a meningioma. This measures  2.5 x 2.4 x 2.6 cm in greatest dimension. A pituitary mass is present measuring 1.8 x 1.9 x 2.2 cm expanding the sella turcica and demonstrating mass effect upon the inferior aspect of the optic chiasm. While this most likely represents a a pituitary macro adenoma, this is not optimally characterized on this examination. No acute intracranial hemorrhage or infarct. No abnormal intra or extra-axial fluid collection. Ventricular size is normal. Cerebellum is unremarkable. Vascular: No asymmetric hyperdense vasculature at the skull base. Skull: Intact Sinuses/Orbits: The orbits and paranasal sinuses are clear. Other: Mastoid air cells and middle ear cavities are clear. IMPRESSION: No acute intracranial abnormality. 2.2 cm pituitary mass. Contrast enhanced MRI examination is recommended for further characterization. 2.5 cm right frontal extra-axial mass most in keeping with a meningioma demonstrating minimal mass effect upon the right frontal lobe. Electronically  Signed   By: Fidela Salisbury M.D.   On: 10/27/2020 20:51   US Carotid Bilateral  Result Date: 10/28/2020 CLINICAL DATA:  Syncopal episode. History of hypertension and hyperlipidemia. EXAM: BILATERAL CAROTID DUPLEX ULTRASOUND TECHNIQUE: Pearline Cables scale imaging, color Doppler and duplex ultrasound were performed of bilateral carotid and vertebral arteries in the neck. COMPARISON:  None. FINDINGS: Examination is minimally degraded due to patient body habitus and poor sonographic window. Criteria: Quantification of carotid stenosis is based on velocity parameters that correlate the residual internal carotid diameter with NASCET-based stenosis levels, using the diameter of the distal internal carotid lumen as the denominator for stenosis measurement. The following velocity measurements were obtained: RIGHT ICA: 124/30 cm/sec CCA: 84/13 cm/sec SYSTOLIC ICA/CCA RATIO:  1.3 ECA: 144 cm/sec LEFT ICA: 100/31 cm/sec CCA: 24/4 cm/sec SYSTOLIC ICA/CCA RATIO:  1.1 ECA: 445 cm/sec RIGHT CAROTID ARTERY: There is no grayscale evidence of significant intimal thickening or atherosclerotic plaque affecting the interrogated portions of the right carotid system. There are no elevated peak systolic velocities within the interrogated course of the right internal carotid artery to suggest a hemodynamically significant stenosis. RIGHT VERTEBRAL ARTERY:  Antegrade flow LEFT CAROTID ARTERY: There is a minimal amount of eccentric atherosclerotic plaque involving the origin and proximal aspects of the left external carotid artery (image 56). There are no elevated peak systolic velocities within the interrogated course of the left internal carotid artery to suggest a hemodynamically significant stenosis. LEFT VERTEBRAL ARTERY:  Antegrade flow IMPRESSION: 1. Minimal amount of left-sided atherosclerotic plaque, not resulting in a hemodynamically significant stenosis within the left internal carotid artery. 2. Unremarkable sonographic evaluation of  the right carotid system. Electronically Signed   By: Sandi Mariscal M.D.   On: 10/28/2020 12:03   ECHOCARDIOGRAM COMPLETE  Result Date: 10/28/2020    ECHOCARDIOGRAM REPORT   Patient Name:   Diane Torres [facility] Date of Exam: 10/28/2020 Medical Rec #:  [id number]     Height:       61.0 in Accession #:    [id number]    Weight:       150.0 lb Date of Birth:  [birth year]     BSA:          1.671 m Patient Age:    71 years      BP:           148/54 mmHg Patient Gender: F             HR:           63 bpm. Exam Location:  [name] Procedure: 2D Echo, Cardiac Doppler and Color Doppler Indications:    Syncope  History:  Patient has no prior history of Echocardiogram examinations.                 Signs/Symptoms:Syncope and CKD; Risk Factors:Dyslipidemia.  Sonographer:    Dustin Flock RDCS Referring Phys: [id number] OLADAPO ADEFESO IMPRESSIONS  1. Left ventricular ejection fraction, by estimation, is 60 to 65%. The left ventricle has normal function. The left ventricle has no regional wall motion abnormalities. Left ventricular diastolic parameters were normal.  2. Right ventricular systolic function is normal. The right ventricular size is normal. There is normal pulmonary artery systolic pressure.  3. The pericardial effusion is posterior to the left ventricle.  4. The mitral valve is degenerative. Trivial mitral valve regurgitation. No evidence of mitral stenosis. Moderate mitral annular calcification.  5. The aortic valve is tricuspid. Aortic valve regurgitation is mild. Mild to moderate aortic valve sclerosis/calcification is present, without any evidence of aortic stenosis.  6. The inferior vena cava is normal in size with greater than 50% respiratory variability, suggesting right atrial pressure of 3 mmHg. FINDINGS  Left Ventricle: Left ventricular ejection fraction, by estimation, is 60 to 65%. The left ventricle has normal function. The left ventricle has no regional wall motion abnormalities. The left ventricular  internal cavity size was normal in size. There is  no left ventricular hypertrophy. Left ventricular diastolic parameters were normal. Right Ventricle: The right ventricular size is normal. No increase in right ventricular wall thickness. Right ventricular systolic function is normal. There is normal pulmonary artery systolic pressure. The tricuspid regurgitant velocity is 2.67 m/s, and  with an assumed right atrial pressure of 3 mmHg, the estimated right ventricular systolic pressure is 72.5 mmHg. Left Atrium: Left atrial size was normal in size. Right Atrium: Right atrial size was normal in size. Pericardium: Trivial pericardial effusion is present. The pericardial effusion is posterior to the left ventricle. Mitral Valve: The mitral valve is degenerative in appearance. There is moderate thickening of the mitral valve leaflet(s). There is moderate calcification of the mitral valve leaflet(s). Moderate mitral annular calcification. Trivial mitral valve regurgitation. No evidence of mitral valve stenosis. Tricuspid Valve: The tricuspid valve is normal in structure. Tricuspid valve regurgitation is mild . No evidence of tricuspid stenosis. Aortic Valve: The aortic valve is tricuspid. Aortic valve regurgitation is mild. Aortic regurgitation PHT measures 498 msec. Mild to moderate aortic valve sclerosis/calcification is present, without any evidence of aortic stenosis. Pulmonic Valve: The pulmonic valve was normal in structure. Pulmonic valve regurgitation is not visualized. No evidence of pulmonic stenosis. Aorta: The aortic root is normal in size and structure. Venous: The inferior vena cava is normal in size with greater than 50% respiratory variability, suggesting right atrial pressure of 3 mmHg. IAS/Shunts: No atrial level shunt detected by color flow Doppler.  LEFT VENTRICLE PLAX 2D LVIDd:         3.70 cm     Diastology LVIDs:         2.00 cm     LV e' medial:    6.85 cm/s LV PW:         1.00 cm     LV E/e'  medial:  12.2 LV IVS:        0.80 cm     LV e' lateral:   6.31 cm/s LVOT diam:     2.20 cm     LV E/e' lateral: 13.3 LV SV:         81 LV SV Index:   49 LVOT Area:  3.80 cm  LV Volumes (MOD) LV vol d, MOD A4C: 86.9 ml LV vol s, MOD A4C: 37.2 ml LV SV MOD A4C:     86.9 ml RIGHT VENTRICLE RV Basal diam:  3.30 cm RV S prime:     11.50 cm/s TAPSE (M-mode): 2.8 cm LEFT ATRIUM             Index       RIGHT ATRIUM           Index LA diam:        2.80 cm 1.68 cm/m  RA Area:     10.90 cm LA Vol (A2C):   22.6 ml 13.52 ml/m RA Volume:   23.20 ml  13.88 ml/m LA Vol (A4C):   31.9 ml 19.09 ml/m LA Biplane Vol: 27.7 ml 16.57 ml/m  AORTIC VALVE LVOT Vmax:   91.60 cm/s LVOT Vmean:  65.600 cm/s LVOT VTI:    0.214 m AI PHT:      498 msec MITRAL VALVE                TRICUSPID VALVE MV Area (PHT): 3.37 cm     TR Peak grad:   28.5 mmHg MV Decel Time: 225 msec     TR Vmax:        267.00 cm/s MV E velocity: 83.90 cm/s MV A velocity: 108.00 cm/s  SHUNTS MV E/A ratio:  0.78         Systemic VTI:  0.21 m                             Systemic Diam: 2.20 cm Jenkins Rouge MD Electronically signed by Jenkins Rouge MD Signature Date/Time: 10/28/2020/1:52:49 PM    Final     Scheduled Meds:  enoxaparin (LOVENOX) injection  40 mg Subcutaneous Q24H   losartan  100 mg Oral Daily   pantoprazole  40 mg Oral Daily   simvastatin  40 mg Oral QHS   Continuous Infusions:  sodium chloride      LOS: 0 days   Kerney Elbe, DO [facility] is on AMION  If 7PM-7AM, please contact night-coverage [email or web address]

## 2020-10-29 DIAGNOSIS — M5440 Lumbago with sciatica, unspecified side: Secondary | ICD-10-CM | POA: Diagnosis not present

## 2020-10-29 DIAGNOSIS — R55 Syncope and collapse: Secondary | ICD-10-CM | POA: Diagnosis not present

## 2020-10-29 DIAGNOSIS — K219 Gastro-esophageal reflux disease without esophagitis: Secondary | ICD-10-CM | POA: Diagnosis not present

## 2020-10-29 DIAGNOSIS — N1831 Chronic kidney disease, stage 3a: Secondary | ICD-10-CM | POA: Diagnosis not present

## 2020-10-29 LAB — CBC WITH DIFFERENTIAL/PLATELET
Abs Immature Granulocytes: 0.04 10*3/uL (ref 0.00–0.07)
Basophils Absolute: 0.1 10*3/uL (ref 0.0–0.1)
Basophils Relative: 1 %
Eosinophils Absolute: 0.3 10*3/uL (ref 0.0–0.5)
Eosinophils Relative: 3 %
HCT: 32.9 % — ABNORMAL LOW (ref 36.0–46.0)
Hemoglobin: 10.2 g/dL — ABNORMAL LOW (ref 12.0–15.0)
Immature Granulocytes: 0 %
Lymphocytes Relative: 34 %
Lymphs Abs: 3.3 10*3/uL (ref 0.7–4.0)
MCH: 30.3 pg (ref 26.0–34.0)
MCHC: 31 g/dL (ref 30.0–36.0)
MCV: 97.6 fL (ref 80.0–100.0)
Monocytes Absolute: 0.6 10*3/uL (ref 0.1–1.0)
Monocytes Relative: 7 %
Neutro Abs: 5.4 10*3/uL (ref 1.7–7.7)
Neutrophils Relative %: 55 %
Platelets: 270 10*3/uL (ref 150–400)
RBC: 3.37 MIL/uL — ABNORMAL LOW (ref 3.87–5.11)
RDW: 16 % — ABNORMAL HIGH (ref 11.5–15.5)
WBC: 9.7 10*3/uL (ref 4.0–10.5)
nRBC: 0 % (ref 0.0–0.2)

## 2020-10-29 LAB — COMPREHENSIVE METABOLIC PANEL
ALT: 16 U/L (ref 0–44)
AST: 15 U/L (ref 15–41)
Albumin: 2.9 g/dL — ABNORMAL LOW (ref 3.5–5.0)
Alkaline Phosphatase: 56 U/L (ref 38–126)
Anion gap: 6 (ref 5–15)
BUN: 16 mg/dL (ref 8–23)
CO2: 23 mmol/L (ref 22–32)
Calcium: 8.6 mg/dL — ABNORMAL LOW (ref 8.9–10.3)
Chloride: 105 mmol/L (ref 98–111)
Creatinine, Ser: 1.06 mg/dL — ABNORMAL HIGH (ref 0.44–1.00)
GFR, Estimated: 53 mL/min — ABNORMAL LOW (ref >60)
Glucose, Bld: 82 mg/dL (ref 70–99)
Potassium: 4 mmol/L (ref 3.5–5.1)
Sodium: 134 mmol/L — ABNORMAL LOW (ref 135–145)
Total Bilirubin: 0.4 mg/dL (ref 0.3–1.2)
Total Protein: 5.8 g/dL — ABNORMAL LOW (ref 6.5–8.1)

## 2020-10-29 LAB — PHOSPHORUS: Phosphorus: 2.8 mg/dL (ref 2.5–4.6)

## 2020-10-29 LAB — MAGNESIUM: Magnesium: 2.2 mg/dL (ref 1.7–2.4)

## 2020-10-29 MED ORDER — ACETAMINOPHEN 500 MG PO TABS: 500.0000 mg | ORAL_TABLET | Freq: Four times a day (QID) | ORAL | 0 refills | Status: DC | PRN

## 2020-10-29 NOTE — Progress Notes (Signed)
PT Cancellation Note  Patient Details Name: Diane Torres MRN: [id number] DOB: February 11, 1940   Cancelled Treatment:    Reason Eval/Treat Not Completed: PT screened, no needs identified, will sign off   10:18 AM, 10/29/20 Lonell Grandchild, MPT Physical Therapist with [facility] [phone] office [phone] mobile phone

## 2020-10-29 NOTE — Discharge Summary (Signed)
Physician Discharge Summary  KALILA ADKISON CHE:[id number] DOB: 1941-01-21 DOA: 10/27/2020  PCP: Celene Squibb, MD  Admit date: 10/27/2020 Discharge date: 10/29/2020  Admitted From: Home Disposition: Home  Recommendations for Outpatient Follow-up:  Follow up with PCP in 1-2 weeks Follow-up with cardiology on 12/06/2020 at 1:20 PM for syncope evaluation  Follow-up with neurosurgery Dr. Christella Noa 10/30/2020 at 34 AM Follow-up with neurology in outpatient setting with Dr. Merlene Laughter and have him order an MRI with and without contrast for further evaluation of the brain mass and pituitary mass Please obtain CMP/CBC, Mag, Phos in one week Please follow up on the following pending results:  Home Health: No  Equipment/Devices: None    Discharge Condition: Stable  CODE STATUS: FULL CODE  Diet recommendation: Heart Healthy Diet    Brief/Interim Summary: The patient is an 80 year old Caucasian female with past medical history significant for but #2 diverticulitis, GERD, hypothyroidism, history of CKD stage IIIa/IIIb, chronic back pain as well as other comorbidities who presented to emergency room due to passing out twice at home.  The patient states that she got an injection from orthopedic surgeon in her back on Wednesday, 10/24/2020 due to chronic back pain and she requested arousal help after getting a shower a few hours prior to admission and states that she passed out for about 5 minutes and on recheck EMS was activated and arrival EMS she passed out again for about 2 minutes and was placed on a stretcher.  She complained of having back pain prior to passing out the first time however she had no back pain prior to the second episode of passing.  She denies any chest pain, shortness breath, fevers, chills, headache or blurry vision.  In the ED she had normal vital signs but work-up showed a leukocytosis, macrocytic anemia and mild hyponatremia.  She had a slightly elevated creatinine and she underwent a head  CT without contrast which showed no acute intracranial abnormality but did show 2.2 cm pituitary mass and 2.5 cm left frontal extra-axial mass most in keeping with a meningioma demonstrating minimal mass-effect upon the right frontal lobe.  Neurosurgery was consulted and felt that the MRI could be done in outpatient setting as a nonemergent study.  She is given IV fluid hydration and hospitalist was asked admit the patient for further evaluation management. She is being hospitalized for syncopal work-up repeat orthostatics showed that she did not have any orthostatic hypotension.  PT OT evaluated and recommended no follow-up.  She will follow-up with PCP, cardiology, neurology, neurosurgery in the outpatient setting for further work-up as she has been deemed stable for discharge at this time  Discharge Diagnoses:  Principal Problem:   Syncope Active Problems:   Mixed hyperlipidemia   GERD (gastroesophageal reflux disease)   Leukocytosis   CKD (chronic kidney disease) stage 3, GFR 30-59 ml/min (HCC)   Pituitary mass (HCC)   Intracranial mass   Macrocytic anemia   Chronic back pain   Essential hypertension  Syncope -Patient had 2 witnessed syncopal episodes -Continue telemetry and watch for arrhythmias -Troponins x2 was flat at 3.  -Orthostatic vital signs will be done and she is not Orthostatic  -Continued IV hydration and changed to normal saline at 75 MLS per hour -EKG showed normal sinus rhythm at a rate of 71 bpm -Echocardiogram will be done to rule out significant aortic stenosis or other outflow obstruction, and also to evaluate EF and to rule out segmental/Regional wall motion abnormalities.  -ECHO showed "Ejection fraction,  by estimation, is 60 to 65%. The  left ventricle has normal function. The left ventricle has no regional  wall motion abnormalities. Left ventricular diastolic parameters were  normal." -Carotid artery Dopplers will be done to rule out hemodynamically  significant stenosis and showed "Minimal amount of left-sided atherosclerotic plaque, not resulting in a hemodynamically  significant stenosis within the left internal carotid artery. Unremarkable sonographic evaluation of the right carotid system." -Referral made to cardiology as well as neurology in outpatient setting for syncope follow-up -PT OT evaluated and she was at her baseline and had no needs  Pituitary mass Intracranial mass -CT head without contrast showed no acute intracranial abnormality, but showed 2.2 cm pituitary mass and 2.5 cm right frontal extra-axial mass suspected to be meningioma -Neurosurgeon consulted states that MRI of brain can be done as an outpatient in a nonemergent setting per ED physician -Had made referral to neurology for evaluation and outpatient MRI as well as for syncope evaluation -Referral made for neurosurgery and she has an appointment on 10/30/2020 at 11 AM with Dr. Christella Noa  Leukocytosis -WBC 12, no obvious sign for any acute infectious process at this time.  Repeat CBC showed a WBC of 12.2 yesterday and today had normalized to 9.7 -Continue to monitor and repeat CBC within 1 week  Macrocytic Anemia -MCV 100.8; vitamin B12 and folate levels were checked and were 278 and 17.2 -Patient's hemoglobin/hematocrit went from 11.2/35.7 is now 10.8/33.4 yesterday and today it is 10.2/32.9 -Check Anemia Panel in the outpatient setting -Continue to Monitor for S/Sx of Bleeding; no overt bleeding noted -Repeat CBC in the AM   Chronic back pain -Stable, continue Tylenol as needed -Patient follows with an orthopedic surgeon in the outpatient setting  Essential hypertension -Continue losartan -Continue to Monitor BP per Protocol  Hyperlipidemia -Continue simvastatin  GERD -Continue Protonix  CKD stage IIIa -BUN/creatinine 30/1.52 on admission (baseline creatinine at 1.1-1.4).  -Patient's BUNs/creatinine improved with IV fluid hydration and is now 16/1.06  and within her baseline -Avoid nephrotoxic medications, contrast dyes, hypotension and renally adjust medications -Repeat CMP within 1 week  Discharge Instructions  Discharge Instructions     Call MD for:  difficulty breathing, headache or visual disturbances   Complete by: As directed    Call MD for:  extreme fatigue   Complete by: As directed    Call MD for:  hives   Complete by: As directed    Call MD for:  persistant dizziness or light-headedness   Complete by: As directed    Call MD for:  persistant nausea and vomiting   Complete by: As directed    Call MD for:  redness, tenderness, or signs of infection (pain, swelling, redness, odor or green/yellow discharge around incision site)   Complete by: As directed    Call MD for:  severe uncontrolled pain   Complete by: As directed    Call MD for:  temperature >100.4   Complete by: As directed    Diet - low sodium heart healthy   Complete by: As directed    Discharge instructions   Complete by: As directed    You were cared for by a hospitalist during your hospital stay. If you have any questions about your discharge medications or the care you received while you were in the hospital after you are discharged, you can call the unit and ask to speak with the hospitalist on call if the hospitalist that took care of you is not available. Once you  are discharged, your primary care physician will handle any further medical issues. Please note that NO REFILLS for any discharge medications will be authorized once you are discharged, as it is imperative that you return to your primary care physician (or establish a relationship with a primary care physician if you do not have one) for your aftercare needs so that they can reassess your need for medications and monitor your lab values.  Follow up with PCP, Neurosurgery, Neurology, and Cardiology in the outpatient setting and obtain MRI as an outpatient. Take all medications as prescribed. If  symptoms change or worsen please return to the ED for evaluation   Increase activity slowly   Complete by: As directed       Allergies as of 10/29/2020       Reactions   Bee Venom Anaphylaxis        Medication List     STOP taking these medications    ciprofloxacin 250 MG tablet Commonly known as: CIPRO   fluconazole 150 MG tablet Commonly known as: DIFLUCAN   ketorolac 60 MG/2ML Soln injection Commonly known as: TORADOL   methocarbamol 500 MG tablet Commonly known as: ROBAXIN   oxyCODONE-acetaminophen 5-325 MG tablet Commonly known as: PERCOCET/ROXICET   predniSONE 5 MG (21) Tbpk tablet Commonly known as: STERAPRED UNI-PAK 21 TAB   traMADol 50 MG tablet Commonly known as: ULTRAM       TAKE these medications    acetaminophen 500 MG tablet Commonly known as: TYLENOL Take 1 tablet (500 mg total) by mouth every 6 (six) hours as needed. What changed: how much to take   cetirizine 10 MG tablet Commonly known as: ZYRTEC Take 10 mg by mouth daily.   gabapentin 100 MG capsule Commonly known as: NEURONTIN Take 100 mg by mouth daily.   losartan 100 MG tablet Commonly known as: COZAAR Take 100 mg by mouth daily.   pantoprazole 40 MG tablet Commonly known as: PROTONIX TAKE ONE TABLET BY MOUTH ONCE DAILY.   simvastatin 40 MG tablet Commonly known as: ZOCOR Take 40 mg by mouth at bedtime.        Follow-up Information     Celene Squibb, MD. Call.   Specialty: Internal Medicine Why: Follow up within 1-2 weeks fo Discharge Contact information: [address] [name] [address] [phone]         Ashok Pall, MD. Go on 10/30/2020.   Specialty: Neurosurgery Why: Appointment Scheduled for 10/30/20 at 11:00 pm; Please arrive by 10:30 to fill out new patient forms Contact information: [address] [place] [address] [place] [address] [phone]         Satira Sark, MD. Go on 12/06/2020.   Specialty: Cardiology Why:  Appointment Scheduled fro 1:20 pm for Syncope Evaluation Contact information: [place] [address] [phone]         Phillips Odor, MD. Schedule an appointment as soon as possible for a visit.   Specialty: Neurology Why: Neurology office to call and schedule outpaitent evaluation and MRI Contact information: [address] [place] [address] [phone]                Allergies  Allergen Reactions   Bee Venom Anaphylaxis   Consultations: Case was discussed with Neurosurgery  Procedures/Studies: CT HEAD WO CONTRAST (5MM)  Result Date: 10/27/2020 CLINICAL DATA:  Cerebral hemorrhage suspected, loss of consciousness, syncope. EXAM: CT HEAD WITHOUT CONTRAST TECHNIQUE: Contiguous axial images were obtained from the base of  the skull through the vertex without intravenous contrast. COMPARISON:  None. FINDINGS: Brain: Partially calcified hyperdense extra-axial mass is seen along the right frontal para falcine calvarium demonstrating minimal mass effect upon the right frontal lobe most in keeping with a meningioma. This measures 2.5 x 2.4 x 2.6 cm in greatest dimension. A pituitary mass is present measuring 1.8 x 1.9 x 2.2 cm expanding the sella turcica and demonstrating mass effect upon the inferior aspect of the optic chiasm. While this most likely represents a a pituitary macro adenoma, this is not optimally characterized on this examination. No acute intracranial hemorrhage or infarct. No abnormal intra or extra-axial fluid collection. Ventricular size is normal. Cerebellum is unremarkable. Vascular: No asymmetric hyperdense vasculature at the skull base. Skull: Intact Sinuses/Orbits: The orbits and paranasal sinuses are clear. Other: Mastoid air cells and middle ear cavities are clear. IMPRESSION: No acute intracranial abnormality. 2.2 cm pituitary mass. Contrast enhanced MRI examination is recommended for further characterization. 2.5 cm right frontal extra-axial  mass most in keeping with a meningioma demonstrating minimal mass effect upon the right frontal lobe. Electronically Signed   By: Fidela Salisbury M.D.   On: 10/27/2020 20:51   US Carotid Bilateral  Result Date: 10/28/2020 CLINICAL DATA:  Syncopal episode. History of hypertension and hyperlipidemia. EXAM: BILATERAL CAROTID DUPLEX ULTRASOUND TECHNIQUE: Pearline Cables scale imaging, color Doppler and duplex ultrasound were performed of bilateral carotid and vertebral arteries in the neck. COMPARISON:  None. FINDINGS: Examination is minimally degraded due to patient body habitus and poor sonographic window. Criteria: Quantification of carotid stenosis is based on velocity parameters that correlate the residual internal carotid diameter with NASCET-based stenosis levels, using the diameter of the distal internal carotid lumen as the denominator for stenosis measurement. The following velocity measurements were obtained: RIGHT ICA: 124/30 cm/sec CCA: 60/10 cm/sec SYSTOLIC ICA/CCA RATIO:  1.3 ECA: 144 cm/sec LEFT ICA: 100/31 cm/sec CCA: 93/2 cm/sec SYSTOLIC ICA/CCA RATIO:  1.1 ECA: 445 cm/sec RIGHT CAROTID ARTERY: There is no grayscale evidence of significant intimal thickening or atherosclerotic plaque affecting the interrogated portions of the right carotid system. There are no elevated peak systolic velocities within the interrogated course of the right internal carotid artery to suggest a hemodynamically significant stenosis. RIGHT VERTEBRAL ARTERY:  Antegrade flow LEFT CAROTID ARTERY: There is a minimal amount of eccentric atherosclerotic plaque involving the origin and proximal aspects of the left external carotid artery (image 56). There are no elevated peak systolic velocities within the interrogated course of the left internal carotid artery to suggest a hemodynamically significant stenosis. LEFT VERTEBRAL ARTERY:  Antegrade flow IMPRESSION: 1. Minimal amount of left-sided atherosclerotic plaque, not resulting in a  hemodynamically significant stenosis within the left internal carotid artery. 2. Unremarkable sonographic evaluation of the right carotid system. Electronically Signed   By: Sandi Mariscal M.D.   On: 10/28/2020 12:03   ECHOCARDIOGRAM COMPLETE  Result Date: 10/28/2020    ECHOCARDIOGRAM REPORT   Patient Name:   THEA HOLSHOUSER [facility] Date of Exam: 10/28/2020 Medical Rec #:  [id number]     Height:       61.0 in Accession #:    [id number]    Weight:       150.0 lb Date of Birth:  07-21-1940     BSA:          1.671 m Patient Age:    6 years      BP:           148/54 mmHg Patient Gender: F  HR:           63 bpm. Exam Location:  [name] Procedure: 2D Echo, Cardiac Doppler and Color Doppler Indications:    Syncope  History:        Patient has no prior history of Echocardiogram examinations.                 Signs/Symptoms:Syncope and CKD; Risk Factors:Dyslipidemia.  Sonographer:    Dustin Flock RDCS Referring Phys: [id number] OLADAPO ADEFESO IMPRESSIONS  1. Left ventricular ejection fraction, by estimation, is 60 to 65%. The left ventricle has normal function. The left ventricle has no regional wall motion abnormalities. Left ventricular diastolic parameters were normal.  2. Right ventricular systolic function is normal. The right ventricular size is normal. There is normal pulmonary artery systolic pressure.  3. The pericardial effusion is posterior to the left ventricle.  4. The mitral valve is degenerative. Trivial mitral valve regurgitation. No evidence of mitral stenosis. Moderate mitral annular calcification.  5. The aortic valve is tricuspid. Aortic valve regurgitation is mild. Mild to moderate aortic valve sclerosis/calcification is present, without any evidence of aortic stenosis.  6. The inferior vena cava is normal in size with greater than 50% respiratory variability, suggesting right atrial pressure of 3 mmHg. FINDINGS  Left Ventricle: Left ventricular ejection fraction, by estimation, is 60 to 65%. The  left ventricle has normal function. The left ventricle has no regional wall motion abnormalities. The left ventricular internal cavity size was normal in size. There is  no left ventricular hypertrophy. Left ventricular diastolic parameters were normal. Right Ventricle: The right ventricular size is normal. No increase in right ventricular wall thickness. Right ventricular systolic function is normal. There is normal pulmonary artery systolic pressure. The tricuspid regurgitant velocity is 2.67 m/s, and  with an assumed right atrial pressure of 3 mmHg, the estimated right ventricular systolic pressure is 83.3 mmHg. Left Atrium: Left atrial size was normal in size. Right Atrium: Right atrial size was normal in size. Pericardium: Trivial pericardial effusion is present. The pericardial effusion is posterior to the left ventricle. Mitral Valve: The mitral valve is degenerative in appearance. There is moderate thickening of the mitral valve leaflet(s). There is moderate calcification of the mitral valve leaflet(s). Moderate mitral annular calcification. Trivial mitral valve regurgitation. No evidence of mitral valve stenosis. Tricuspid Valve: The tricuspid valve is normal in structure. Tricuspid valve regurgitation is mild . No evidence of tricuspid stenosis. Aortic Valve: The aortic valve is tricuspid. Aortic valve regurgitation is mild. Aortic regurgitation PHT measures 498 msec. Mild to moderate aortic valve sclerosis/calcification is present, without any evidence of aortic stenosis. Pulmonic Valve: The pulmonic valve was normal in structure. Pulmonic valve regurgitation is not visualized. No evidence of pulmonic stenosis. Aorta: The aortic root is normal in size and structure. Venous: The inferior vena cava is normal in size with greater than 50% respiratory variability, suggesting right atrial pressure of 3 mmHg. IAS/Shunts: No atrial level shunt detected by color flow Doppler.  LEFT VENTRICLE PLAX 2D LVIDd:          3.70 cm     Diastology LVIDs:         2.00 cm     LV e' medial:    6.85 cm/s LV PW:         1.00 cm     LV E/e' medial:  12.2 LV IVS:        0.80 cm     LV e' lateral:   6.31  cm/s LVOT diam:     2.20 cm     LV E/e' lateral: 13.3 LV SV:         81 LV SV Index:   49 LVOT Area:     3.80 cm  LV Volumes (MOD) LV vol d, MOD A4C: 86.9 ml LV vol s, MOD A4C: 37.2 ml LV SV MOD A4C:     86.9 ml RIGHT VENTRICLE RV Basal diam:  3.30 cm RV S prime:     11.50 cm/s TAPSE (M-mode): 2.8 cm LEFT ATRIUM             Index       RIGHT ATRIUM           Index LA diam:        2.80 cm 1.68 cm/m  RA Area:     10.90 cm LA Vol (A2C):   22.6 ml 13.52 ml/m RA Volume:   23.20 ml  13.88 ml/m LA Vol (A4C):   31.9 ml 19.09 ml/m LA Biplane Vol: 27.7 ml 16.57 ml/m  AORTIC VALVE LVOT Vmax:   91.60 cm/s LVOT Vmean:  65.600 cm/s LVOT VTI:    0.214 m AI PHT:      498 msec MITRAL VALVE                TRICUSPID VALVE MV Area (PHT): 3.37 cm     TR Peak grad:   28.5 mmHg MV Decel Time: 225 msec     TR Vmax:        267.00 cm/s MV E velocity: 83.90 cm/s MV A velocity: 108.00 cm/s  SHUNTS MV E/A ratio:  0.78         Systemic VTI:  0.21 m                             Systemic Diam: 2.20 cm Jenkins Rouge MD Electronically signed by Jenkins Rouge MD Signature Date/Time: 10/28/2020/1:52:49 PM    Final     Subjective: Seen and examined at bedside and she was back to her baseline and felt well.  Denied chest pain or shortness of breath.  No nausea or vomiting.  Worked well with PT and did not have any orthostatic hypotension.  She is stable from a medical perspective to be discharged and follow-up with respective specialists mentioned as above.   Discharge Exam: Vitals:   10/28/20 2028 10/29/20 0525  BP: (!) 136/45 (!) 148/48  Pulse: 71 68  Resp: 18 18  Temp: 97.9 F (36.6 C) (!) 97.3 F (36.3 C)  SpO2: 97% 96%   Vitals:   10/28/20 0950 10/28/20 1446 10/28/20 2028 10/29/20 0525  BP:  (!) 134/47 (!) 136/45 (!) 148/48  Pulse:  73 71 68  Resp:  18  18 18   Temp:  98.5 F (36.9 C) 97.9 F (36.6 C) (!) 97.3 F (36.3 C)  TempSrc:  Oral    SpO2: 97% 94% 97% 96%  Weight:      Height:       General: Pt is alert, awake, not in acute distress Cardiovascular: RRR, S1/S2 +, no rubs, no gallops Respiratory: Mildly diminished bilaterally, no wheezing, no rhonchi Abdominal: Soft, NT, distended secondary habitus, bowel sounds + Extremities: no edema, no cyanosis  The results of significant diagnostics from this hospitalization (including imaging, microbiology, ancillary and laboratory) are listed below for reference.    Microbiology: Recent Results (from the past 240 hour(s))  Resp Panel by  RT-PCR (Flu A&B, Covid) Nasopharyngeal Swab     Status: None   Collection Time: 10/27/20  8:13 PM   Specimen: Nasopharyngeal Swab; Nasopharyngeal(NP) swabs in vial transport medium  Result Value Ref Range Status   SARS Coronavirus 2 by RT PCR NEGATIVE NEGATIVE Final    Comment: (NOTE) SARS-CoV-2 target nucleic acids are NOT DETECTED.  The SARS-CoV-2 RNA is generally detectable in upper respiratory specimens during the acute phase of infection. The lowest concentration of SARS-CoV-2 viral copies this assay can detect is 138 copies/mL. A negative result does not preclude SARS-Cov-2 infection and should not be used as the sole basis for treatment or other patient management decisions. A negative result may occur with  improper specimen collection/handling, submission of specimen other than nasopharyngeal swab, presence of viral mutation(s) within the areas targeted by this assay, and inadequate number of viral copies(<138 copies/mL). A negative result must be combined with clinical observations, patient history, and epidemiological information. The expected result is Negative.  Fact Sheet for Patients:  EntrepreneurPulse.com.au  Fact Sheet for Healthcare Providers:  IncredibleEmployment.be  This test is no t  yet approved or cleared by the [place] FDA and  has been authorized for detection and/or diagnosis of SARS-CoV-2 by FDA under an Emergency Use Authorization (EUA). This EUA will remain  in effect (meaning this test can be used) for the duration of the COVID-19 declaration under Section 564(b)(1) of the Act, 21 U.S.C.section 360bbb-3(b)(1), unless the authorization is terminated  or revoked sooner.       Influenza A by PCR NEGATIVE NEGATIVE Final   Influenza B by PCR NEGATIVE NEGATIVE Final    Comment: (NOTE) The Xpert Xpress SARS-CoV-2/FLU/RSV plus assay is intended as an aid in the diagnosis of influenza from Nasopharyngeal swab specimens and should not be used as a sole basis for treatment. Nasal washings and aspirates are unacceptable for Xpert Xpress SARS-CoV-2/FLU/RSV testing.  Fact Sheet for Patients: EntrepreneurPulse.com.au  Fact Sheet for Healthcare Providers: IncredibleEmployment.be  This test is not yet approved or cleared by the [place] FDA and has been authorized for detection and/or diagnosis of SARS-CoV-2 by FDA under an Emergency Use Authorization (EUA). This EUA will remain in effect (meaning this test can be used) for the duration of the COVID-19 declaration under Section 564(b)(1) of the Act, 21 U.S.C. section 360bbb-3(b)(1), unless the authorization is terminated or revoked.  Performed at [facility], [address], [place], [place] [address]     Labs: BNP (last 3 results) No results for input(s): BNP in the last 8760 hours. Basic Metabolic Panel: Recent Labs  Lab 10/27/20 2021 10/28/20 0504 10/29/20 0507  NA 134* 137 134*  K 4.1 4.4 4.0  CL 105 107 105  CO2 18* 24 23  GLUCOSE 116* 95 82  BUN 30* 24* 16  CREATININE 1.52* 1.04* 1.06*  CALCIUM 8.9 9.0 8.6*  MG  --  2.3 2.2  PHOS  --  4.0 2.8   Liver Function Tests: Recent Labs  Lab 10/27/20 2021 10/28/20 0504 10/29/20 0507  AST 21 18 15    ALT 22 20 16   ALKPHOS 60 60 56  BILITOT 0.3 0.4 0.4  PROT 6.6 6.2* 5.8*  ALBUMIN 3.5 3.2* 2.9*   No results for input(s): LIPASE, AMYLASE in the last 168 hours. No results for input(s): AMMONIA in the last 168 hours. CBC: Recent Labs  Lab 10/27/20 2021 10/28/20 0504 10/29/20 0507  WBC 12.0* 12.2* 9.7  NEUTROABS  --   --  5.4  HGB 11.2* 10.8* 10.2*  HCT 35.7* 33.4* 32.9*  MCV 100.8* 96.5 97.6  PLT 308 303 270   Cardiac Enzymes: No results for input(s): CKTOTAL, CKMB, CKMBINDEX, TROPONINI in the last 168 hours. BNP: Invalid input(s): POCBNP CBG: No results for input(s): GLUCAP in the last 168 hours. D-Dimer No results for input(s): DDIMER in the last 72 hours. Hgb A1c No results for input(s): HGBA1C in the last 72 hours. Lipid Profile No results for input(s): CHOL, HDL, LDLCALC, TRIG, CHOLHDL, LDLDIRECT in the last 72 hours. Thyroid function studies No results for input(s): TSH, T4TOTAL, T3FREE, THYROIDAB in the last 72 hours.  Invalid input(s): FREET3 Anemia work up Recent Labs    10/28/20 0504  VITAMINB12 278  FOLATE 17.2   Urinalysis    Component Value Date/Time   COLORURINE STRAW (A) 10/28/2020 0100   APPEARANCEUR CLEAR 10/28/2020 0100   APPEARANCEUR Clear 10/22/2020 1140   LABSPEC 1.005 10/28/2020 0100   PHURINE 6.0 10/28/2020 0100   GLUCOSEU NEGATIVE 10/28/2020 0100   HGBUR NEGATIVE 10/28/2020 0100   BILIRUBINUR NEGATIVE 10/28/2020 0100   BILIRUBINUR Negative 10/22/2020 1140   KETONESUR NEGATIVE 10/28/2020 0100   PROTEINUR NEGATIVE 10/28/2020 0100   UROBILINOGEN 0.2 09/07/2006 1322   NITRITE NEGATIVE 10/28/2020 0100   LEUKOCYTESUR LARGE (A) 10/28/2020 0100   Sepsis Labs Invalid input(s): PROCALCITONIN,  WBC,  LACTICIDVEN Microbiology Recent Results (from the past 240 hour(s))  Resp Panel by RT-PCR (Flu A&B, Covid) Nasopharyngeal Swab     Status: None   Collection Time: 10/27/20  8:13 PM   Specimen: Nasopharyngeal Swab; Nasopharyngeal(NP) swabs  in vial transport medium  Result Value Ref Range Status   SARS Coronavirus 2 by RT PCR NEGATIVE NEGATIVE Final    Comment: (NOTE) SARS-CoV-2 target nucleic acids are NOT DETECTED.  The SARS-CoV-2 RNA is generally detectable in upper respiratory specimens during the acute phase of infection. The lowest concentration of SARS-CoV-2 viral copies this assay can detect is 138 copies/mL. A negative result does not preclude SARS-Cov-2 infection and should not be used as the sole basis for treatment or other patient management decisions. A negative result may occur with  improper specimen collection/handling, submission of specimen other than nasopharyngeal swab, presence of viral mutation(s) within the areas targeted by this assay, and inadequate number of viral copies(<138 copies/mL). A negative result must be combined with clinical observations, patient history, and epidemiological information. The expected result is Negative.  Fact Sheet for Patients:  EntrepreneurPulse.com.au  Fact Sheet for Healthcare Providers:  IncredibleEmployment.be  This test is no t yet approved or cleared by the [place] FDA and  has been authorized for detection and/or diagnosis of SARS-CoV-2 by FDA under an Emergency Use Authorization (EUA). This EUA will remain  in effect (meaning this test can be used) for the duration of the COVID-19 declaration under Section 564(b)(1) of the Act, 21 U.S.C.section 360bbb-3(b)(1), unless the authorization is terminated  or revoked sooner.       Influenza A by PCR NEGATIVE NEGATIVE Final   Influenza B by PCR NEGATIVE NEGATIVE Final    Comment: (NOTE) The Xpert Xpress SARS-CoV-2/FLU/RSV plus assay is intended as an aid in the diagnosis of influenza from Nasopharyngeal swab specimens and should not be used as a sole basis for treatment. Nasal washings and aspirates are unacceptable for Xpert Xpress  SARS-CoV-2/FLU/RSV testing.  Fact Sheet for Patients: EntrepreneurPulse.com.au  Fact Sheet for Healthcare Providers: IncredibleEmployment.be  This test is not yet approved or cleared by the [place] and  has been authorized for detection and/or diagnosis of SARS-CoV-2 by FDA under an Emergency Use Authorization (EUA). This EUA will remain in effect (meaning this test can be used) for the duration of the COVID-19 declaration under Section 564(b)(1) of the Act, 21 U.S.C. section 360bbb-3(b)(1), unless the authorization is terminated or revoked.  Performed at [facility], [address] [place], [place] [address]    Time coordinating discharge: 35 minutes  SIGNED:  Kerney Elbe, DO [facility] 10/29/2020, 5:47 PM Pager is on [place]  If 7PM-7AM, please contact night-coverage [email or web address]

## 2020-10-29 NOTE — Evaluation (Signed)
Occupational Therapy Evaluation Patient Details Name: Diane Torres MRN: [id number] DOB: 02/15/1940 Today's Date: 10/29/2020   History of Present Illness The patient is an 80 year old Caucasian female with past medical history significant for but #2 diverticulitis, GERD, hypothyroidism, history of CKD stage IIIa/IIIb, chronic back pain as well as other comorbidities who presented to emergency room due to passing out twice at home.  The patient states that she got an injection from orthopedic surgeon in her back on Wednesday, 10/24/2020. 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   Clinical Impression   Pt agreeable to OT evaluation, reports she is feeling like her normal self. Pt performing ADLs and functional mobility independently, OT assisting with managing IV pole. Pt with no dizziness or weakness. Pt is functioning at her baseline, no further OT services required at this time.       Recommendations for follow up therapy are one component of a multi-disciplinary discharge planning process, led by the attending physician.  Recommendations may be updated based on patient status, additional functional criteria and insurance authorization.   Follow Up Recommendations  No OT follow up    Equipment Recommendations  None recommended by OT       Precautions / Restrictions Precautions Precautions: None Restrictions Weight Bearing Restrictions: No      Mobility Bed Mobility Overal bed mobility: Independent                  Transfers Overall transfer level: Independent Equipment used: None                      ADL either performed or assessed with clinical judgement   ADL Overall ADL's : Modified independent;At baseline                                              Vision Baseline Vision/History: 0 No visual deficits Ability to See in Adequate Light: 0 Adequate Patient Visual Report: No change from baseline Vision Assessment?: No apparent visual deficits            Pertinent Vitals/Pain Pain Assessment: No/denies pain     Hand Dominance Right   Extremity/Trunk Assessment Upper Extremity Assessment Upper Extremity Assessment: Overall WFL for tasks assessed   Lower Extremity Assessment Lower Extremity Assessment: Defer to PT evaluation   Cervical / Trunk Assessment Cervical / Trunk Assessment: Normal   Communication Communication Communication: No difficulties   Cognition Arousal/Alertness: Awake/alert Behavior During Therapy: WFL for tasks assessed/performed Overall Cognitive Status: Within Functional Limits for tasks assessed                                                Home Living Family/patient expects to be discharged to:: Private residence Living Arrangements: Spouse/significant other Available Help at Discharge: Family;Available 24 hours/day Type of Home: House Home Access: Stairs to enter [employer] of Steps: 2   Home Layout: One level     Bathroom Shower/Tub: Teacher, early years/pre: Standard     Home Equipment: None  Prior Functioning/Environment Level of Independence: Independent        Comments: Independent in ADLs, functional mobility, housekeeping        OT Problem List: Decreased activity tolerance       AM-PAC OT "6 Clicks" Daily Activity     Outcome Measure Help from another person eating meals?: None Help from another person taking care of personal grooming?: None Help from another person toileting, which includes using toliet, bedpan, or urinal?: None Help from another person bathing (including washing, rinsing, drying)?: None Help from another person to put on and taking off regular upper body clothing?: None Help from  another person to put on and taking off regular lower body clothing?: None 6 Click Score: 24   End of Session    Activity Tolerance: Patient tolerated treatment well Patient left: in chair;with call bell/phone within reach;with family/visitor present  OT Visit Diagnosis: Muscle weakness (generalized) (M62.81);Other (comment)                Time: 8185-9093 OT Time Calculation (min): 19 min Charges:  OT General Charges $OT Visit: 1 Visit OT Evaluation $OT Eval Low Complexity: [place], OTR/L  [phone] 10/29/2020, 9:55 AM

## 2020-10-31 ENCOUNTER — Telehealth: Payer: Self-pay | Admitting: Gastroenterology

## 2020-10-31 ENCOUNTER — Telehealth: Payer: Self-pay | Admitting: Internal Medicine

## 2020-10-31 NOTE — Telephone Encounter (Signed)
[phone] please call patient, she has a question about one of her medications

## 2020-10-31 NOTE — Telephone Encounter (Signed)
error 

## 2020-10-31 NOTE — Telephone Encounter (Signed)
Spoke with patient. Diarrhea is her primary issue.  Having 3-4 watery bowel movements daily.  Also with some nocturnal stools.  Started on Monday after coming home from the hospital. Minimal left sided discomfort. No fever. Hospitalized over the weekend after a syncopal event. No recurrent symptoms. Discussed need for CT to confirm diverticulitis, but she states she doesn't have the time for this. Recommended stool studies as her primary concern is diarrhea.  She states she will think about this and call us back.  Says she is overwhelmed with appointments and does not have time.  Encouraged her that we needed to identified the problem prior to giving her antibiotics to ensure we are treating the correct thing.  Tammy, Juluis Rainier

## 2020-10-31 NOTE — Telephone Encounter (Signed)
Pt called stating that she is having a diverticulitis flare up and is unable to get into the office to be seen. Pt states that she was in the hospital this past weekend for passing out and was discharged on Mon. Pt is wanting to know if there is anyway medication could be called in to [facility] for her diverticulitis flare up. Pt was last seen in the office on 12/08/2018. Please advise. Thanks.

## 2020-10-31 NOTE — Telephone Encounter (Signed)
Please find out what symptoms patient is having. Abdominal pain and location, N/V, diarrhea, fever, rectal bleeding? I would like to have documentation of diverticulitis prior to sending in antibiotics to ensure her symptoms are secondary to diverticulitis. To do this, we will need to arrange a CT scan for her.

## 2020-10-31 NOTE — Telephone Encounter (Signed)
Pt states that she is having diarrhea and left side pain. Pt states that she is not having abdominal pain, n/v, fever, or rectal bleeding. Pt states that the diarrhea has been going on since Sunday and has gotten gradually worse. Pt has not been on any antibiotics or around anyone that has been sick that she knows of recently.

## 2020-11-01 NOTE — Telephone Encounter (Signed)
Communication noted.  

## 2020-11-05 ENCOUNTER — Other Ambulatory Visit: Payer: Self-pay | Admitting: Neurosurgery

## 2020-11-05 ENCOUNTER — Other Ambulatory Visit (HOSPITAL_COMMUNITY): Payer: Self-pay | Admitting: Neurosurgery

## 2020-11-05 DIAGNOSIS — D497 Neoplasm of unspecified behavior of endocrine glands and other parts of nervous system: Secondary | ICD-10-CM

## 2020-11-19 ENCOUNTER — Other Ambulatory Visit: Payer: Medicare Other | Admitting: Urology

## 2020-11-20 ENCOUNTER — Ambulatory Visit (HOSPITAL_COMMUNITY)
Admission: RE | Admit: 2020-11-20 | Discharge: 2020-11-20 | Disposition: A | Discharge status: Home / Self Care | Payer: Medicare Other | Source: Ambulatory Visit | Attending: Neurosurgery | Admitting: Neurosurgery

## 2020-11-20 ENCOUNTER — Other Ambulatory Visit: Payer: Self-pay

## 2020-11-20 DIAGNOSIS — D497 Neoplasm of unspecified behavior of endocrine glands and other parts of nervous system: Secondary | ICD-10-CM | POA: Insufficient documentation

## 2020-11-20 MED ORDER — GADOBUTROL 1 MMOL/ML IV SOLN
7.0000 mL | Freq: Once | INTRAVENOUS | Status: AC | PRN
  Administered 2020-11-20: 7 mL via INTRAVENOUS

## 2020-12-06 ENCOUNTER — Ambulatory Visit: Payer: Medicare Other | Admitting: Cardiology

## 2020-12-13 ENCOUNTER — Ambulatory Visit: Payer: Medicare Other | Admitting: Cardiology

## 2020-12-13 ENCOUNTER — Encounter: Payer: Self-pay | Admitting: Cardiology

## 2020-12-13 ENCOUNTER — Ambulatory Visit (INDEPENDENT_AMBULATORY_CARE_PROVIDER_SITE_OTHER): Payer: Medicare Other

## 2020-12-13 DIAGNOSIS — R55 Syncope and collapse: Secondary | ICD-10-CM

## 2020-12-13 DIAGNOSIS — I10 Essential (primary) hypertension: Secondary | ICD-10-CM | POA: Diagnosis not present

## 2020-12-13 DIAGNOSIS — E236 Other disorders of pituitary gland: Secondary | ICD-10-CM

## 2020-12-13 DIAGNOSIS — E782 Mixed hyperlipidemia: Secondary | ICD-10-CM

## 2020-12-13 MED ORDER — ISOSORBIDE MONONITRATE ER 30 MG PO TB24: 30.0000 mg | ORAL_TABLET | Freq: Every day | ORAL | 3 refills | Status: DC

## 2020-12-13 NOTE — Assessment & Plan Note (Signed)
This is been reviewed by Dr. Christella Noa with her of neurosurgery.  Benign.

## 2020-12-13 NOTE — Patient Instructions (Signed)
Medication Instructions:  The current medical regimen is effective;  continue present plan and medications.  *If you need a refill on your cardiac medications before your next appointment, please call your pharmacy*  Testing/Procedures: [place] Monitor Instructions  Your physician has requested you wear a ZIO patch monitor for 14 days.  This is a single patch monitor. [employer] supplies one patch monitor per enrollment. Additional stickers are not available. Please do not apply patch if you will be having a Nuclear Stress Test,  Echocardiogram, Cardiac CT, MRI, or Chest Xray during the period you would be wearing the  monitor. The patch cannot be worn during these tests. You cannot remove and re-apply the  ZIO XT patch monitor.  Your ZIO patch monitor will be mailed 3 day USPS to your address on file. It may take 3-5 days  to receive your monitor after you have been enrolled.  Once you have received your monitor, please review the enclosed instructions. Your monitor  has already been registered assigning a specific monitor serial # to you.  Billing and Patient Assistance Program Information  We have supplied [employer] with any of your insurance information on file for billing purposes. [employer] offers a sliding scale Patient Assistance Program for patients that do not have  insurance, or whose insurance does not completely cover the cost of the ZIO monitor.  You must apply for the Patient Assistance Program to qualify for this discounted rate.  To apply, please call [employer] at [phone], select option 4, select option 2, ask to apply for  Patient Assistance Program. Theodore Demark will ask your household income, and how many people  are in your household. They will quote your out-of-pocket cost based on that information.  [employer] will also be able to set up a 24-month interest-free payment plan if needed.  Applying the monitor   Shave hair from upper left chest.  Hold abrader disc  by orange tab. Rub abrader in 40 strokes over the upper left chest as  indicated in your monitor instructions.  Clean area with 4 enclosed alcohol pads. Let dry.  Apply patch as indicated in monitor instructions. Patch will be placed under collarbone on left  side of chest with arrow pointing upward.  Rub patch adhesive wings for 2 minutes. Remove white label marked "1". Remove the white  label marked "2". Rub patch adhesive wings for 2 additional minutes.  While looking in a mirror, press and release button in center of patch. A small green light will  flash 3-4 times. This will be your only indicator that the monitor has been turned on.  Do not shower for the first 24 hours. You may shower after the first 24 hours.  Press the button if you feel a symptom. You will hear a small click. Record Date, Time and  Symptom in the Patient Logbook.  When you are ready to remove the patch, follow instructions on the last 2 pages of Patient  Logbook. Stick patch monitor onto the last page of Patient Logbook.  Place Patient Logbook in the blue and white box. Use locking tab on box and tape box closed  securely. The blue and white box has prepaid postage on it. Please place it in the mailbox as  soon as possible. Your physician should have your test results approximately 7 days after the  monitor has been mailed back to [facility]  Call [facility] [phone] you have questions regarding  your ZIO XT patch  monitor. Call them immediately if you see an orange light blinking on your  monitor.  If your monitor falls off in less than 4 days, contact our Monitor department at [phone].  If your monitor becomes loose or falls off after 4 days call [employer] at [phone] for  suggestions on securing your monitor  Follow-Up: At [facility], you and your health needs are our priority.  As part of our continuing mission to provide you with exceptional heart care, we have  created designated Provider Care Teams.  These Care Teams include your primary Cardiologist (physician) and Advanced Practice Providers (APPs -  Physician Assistants and Nurse Practitioners) who all work together to provide you with the care you need, when you need it.  We recommend signing up for the patient portal called "MyChart".  Sign up information is provided on this After Visit Summary.  MyChart is used to connect with patients for Virtual Visits (Telemedicine).  Patients are able to view lab/test results, encounter notes, upcoming appointments, etc.  Non-urgent messages can be sent to your provider as well.   To learn more about what you can do with MyChart, go to [email or web address].    Your next appointment:   Follow up will be determined after the above testing has been completed.  Thank you for choosing [facility]!!

## 2020-12-13 NOTE — Assessment & Plan Note (Signed)
Doing well simvastatin 40 mg a day.  No myalgias.  Carotid plaque noted.  Mild.

## 2020-12-13 NOTE — Assessment & Plan Note (Signed)
Currently well controlled, taking losartan 100 mg daily.

## 2020-12-13 NOTE — Progress Notes (Signed)
Cardiology Office Note:    Date:  12/13/2020   ID:  Diane Torres, DOB 05/13/1940, MRN [id number]  PCP:  Celene Squibb, MD   [facility] Providers Cardiologist:  None     Referring MD: Celene Squibb, MD    History of Present Illness:    Diane Torres is a 80 y.o. female here for the evaluation of syncope posthospitalization in late September 2022 at the request of Dr. Wende Torres.  She got an injection from orthopedic surgeon on 10/24/2020 secondary to back pain and needed help after getting a shower and passed out for about 5 minutes. Back hurting, just after shower putting on T shirt, felt poor. - got her on commode, mouth wide open and scared husband. Went out.  EMS arrived.  Passed out again for 2 minutes. Back pain in June - went out, increased stress. Aug 31, 2022 son in law died.  Placed on a stretcher.  She had back pain prior to passing out during her first episode.  In the ER head CT showed 2.2 cm pituitary mass and meningioma as well.  Neurosurgery said that MRI can be done in the outpatient setting. Dr. Aretha Parrot saw and said ok.   No further syncope.   I took care of her husband Diane Torres-- severe CAD  She had work-up in the hospital IV fluid hydration.  Troponins were negative.  Telemetry was unremarkable.  Echo showed normal ejection fraction 60 to 18% normal diastolic parameters.  Normal valves.  Carotid Dopplers showed mild left-sided plaque.  Blood pressure has been under good control with losartan.  She takes simvastatin for hyperlipidemia.  Hospital records reviewed, personally reviewed telemetry monitoring, EKG, lab work.  CT  Past Medical History:  Diagnosis Date   CAP (community acquired pneumonia) 05/2018   Diverticulitis    CT verified in 2015, 2018, 2019, 2020   GERD (gastroesophageal reflux disease)    Hypothyroidism    Impaired fasting glucose    Mixed hyperlipidemia     Past Surgical History:  Procedure Laterality Date   COLONOSCOPY     COLONOSCOPY N/A 12/12/2015    Dr. Gala Romney: one 3 mm tubular adenoma in rectum s/p removal. Diverticulosis in entire colon. Internal hemorrhoids.    ESOPHAGOGASTRODUODENOSCOPY N/A 03/19/2016   Dr. Gala Romney: LA Grade A esophagitis, empiric dilation, normal stomach, normal duodenum   Left lobe thyroidectomy     MALONEY DILATION N/A 03/19/2016   Procedure: Venia Minks DILATION;  Surgeon: Daneil Dolin, MD;  Location: [facility];  Service: Endoscopy;  Laterality: N/A;   POLYPECTOMY  12/12/2015   Procedure: POLYPECTOMY;  Surgeon: Daneil Dolin, MD;  Location: [facility];  Service: Endoscopy;;  colon   RECTOCELE REPAIR     TOTAL ABDOMINAL HYSTERECTOMY W/ BILATERAL SALPINGOOPHORECTOMY      Current Medications: Current Meds  Medication Sig   acetaminophen (TYLENOL) 500 MG tablet Take 1 tablet (500 mg total) by mouth every 6 (six) hours as needed.   cetirizine (ZYRTEC) 10 MG tablet Take 10 mg by mouth daily.   losartan (COZAAR) 100 MG tablet Take 100 mg by mouth daily.   Melatonin 10 MG TABS Take by mouth.   pantoprazole (PROTONIX) 40 MG tablet TAKE ONE TABLET BY MOUTH ONCE DAILY. (Patient taking differently: Take 40 mg by mouth daily.)   simvastatin (ZOCOR) 40 MG tablet Take 40 mg by mouth at bedtime.    [DISCONTINUED] isosorbide mononitrate (IMDUR) 30 MG 24 hr tablet Take 1 tablet (30 mg total) by  mouth daily.     Allergies:   Bee venom   Social History   Socioeconomic History   Marital status: Married    Spouse name: Not on file   Number of children: Not on file   Years of education: Not on file   Highest education level: Not on file  Occupational History   Not on file  Tobacco Use   Smoking status: Never   Smokeless tobacco: Never  Vaping Use   Vaping Use: Never used  Substance and Sexual Activity   Alcohol use: No   Drug use: No   Sexual activity: Not on file  Other Topics Concern   Not on file  Social History Narrative   Not on file   Social Determinants of Health   Financial Resource Strain: Not on  file  Food Insecurity: Not on file  Transportation Needs: Not on file  Physical Activity: Not on file  Stress: Not on file  Social Connections: Not on file     Family History: The patient's family history includes CAD in her brother and father; Crohn's disease in her brother; Diabetes Mellitus II in her mother; Heart failure in her mother; Pancreatic cancer in her brother. There is no history of Colon cancer.  ROS:   Please see the history of present illness.     All other systems reviewed and are negative.  EKGs/Labs/Other Studies Reviewed:    The following studies were reviewed today:  ECHO 10/28/20:    1. Left ventricular ejection fraction, by estimation, is 60 to 65%. The  left ventricle has normal function. The left ventricle has no regional  wall motion abnormalities. Left ventricular diastolic parameters were  normal.   2. Right ventricular systolic function is normal. The right ventricular  size is normal. There is normal pulmonary artery systolic pressure.   3. The pericardial effusion is posterior to the left ventricle.   4. The mitral valve is degenerative. Trivial mitral valve regurgitation.  No evidence of mitral stenosis. Moderate mitral annular calcification.   5. The aortic valve is tricuspid. Aortic valve regurgitation is mild.  Mild to moderate aortic valve sclerosis/calcification is present, without  any evidence of aortic stenosis.   6. The inferior vena cava is normal in size with greater than 50%  respiratory variability, suggesting right atrial pressure of 3 mmHg  EKG:    Recent Labs: 10/29/2020: ALT 16; BUN 16; Creatinine, Ser 1.06; Hemoglobin 10.2; Magnesium 2.2; Platelets 270; Potassium 4.0; Sodium 134  Recent Lipid Panel No results found for: CHOL, TRIG, HDL, CHOLHDL, VLDL, LDLCALC, LDLDIRECT   Risk Assessment/Calculations:          Physical Exam:    VS:  BP (!) 144/82   Pulse 83   Ht 5\' 1"  (1.549 m)   Wt 156 lb 3.2 oz (70.9 kg)   SpO2  99%   BMI 29.51 kg/m     Wt Readings from Last 3 Encounters:  12/13/20 156 lb 3.2 oz (70.9 kg)  10/27/20 150 lb (68 kg)  10/22/20 153 lb 4 oz (69.5 kg)     GEN:  Well nourished, well developed in no acute distress HEENT: Normal NECK: No JVD; No carotid bruits LYMPHATICS: No lymphadenopathy CARDIAC: RRR, no murmurs, rubs, gallops RESPIRATORY:  Clear to auscultation without rales, wheezing or rhonchi  ABDOMEN: Soft, non-tender, non-distended MUSCULOSKELETAL:  No edema; No deformity  SKIN: Warm and dry NEUROLOGIC:  Alert and oriented x 3 PSYCHIATRIC:  Normal affect   ASSESSMENT:  1. Syncope, unspecified syncope type   2. Pituitary mass ([place])   3. Essential hypertension   4. Mixed hyperlipidemia    PLAN:    In order of problems listed above:  Syncope Thankfully she has not had any further episodes of syncope.  Certainly this could have been attributed to slight dehydration with her creatinine increase noted as well as pain and could have triggered a vagal type response.  However given her advanced age, I would like to check a Zio patch monitor for 14 days to make sure she does not have any adverse conduction abnormalities.  Her EKG looks unremarkable.  Echocardiogram unremarkable.  Carotid Dopplers with mild plaque.  We will follow-up with results of study  Pituitary mass [facility]) This is been reviewed by Dr. Christella Noa with her of neurosurgery.  Benign.    Essential hypertension Currently well controlled, taking losartan 100 mg daily.  Mixed hyperlipidemia Doing well simvastatin 40 mg a day.  No myalgias.  Carotid plaque noted.  Mild.       Medication Adjustments/Labs and Tests Ordered: Current medicines are reviewed at length with the patient today.  Concerns regarding medicines are outlined above.  Orders Placed This Encounter  Procedures   LONG TERM MONITOR (3-14 DAYS)   Meds ordered this encounter  Medications   DISCONTD: isosorbide mononitrate (IMDUR) 30 MG 24  hr tablet    Sig: Take 1 tablet (30 mg total) by mouth daily.    Dispense:  90 tablet    Refill:  3     Patient Instructions  Medication Instructions:  The current medical regimen is effective;  continue present plan and medications.  *If you need a refill on your cardiac medications before your next appointment, please call your pharmacy*  Testing/Procedures: [place] Monitor Instructions  Your physician has requested you wear a ZIO patch monitor for 14 days.  This is a single patch monitor. [employer] supplies one patch monitor per enrollment. Additional stickers are not available. Please do not apply patch if you will be having a Nuclear Stress Test,  Echocardiogram, Cardiac CT, MRI, or Chest Xray during the period you would be wearing the  monitor. The patch cannot be worn during these tests. You cannot remove and re-apply the  ZIO XT patch monitor.  Your ZIO patch monitor will be mailed 3 day USPS to your address on file. It may take 3-5 days  to receive your monitor after you have been enrolled.  Once you have received your monitor, please review the enclosed instructions. Your monitor  has already been registered assigning a specific monitor serial # to you.  Billing and Patient Assistance Program Information  We have supplied [employer] with any of your insurance information on file for billing purposes. [employer] offers a sliding scale Patient Assistance Program for patients that do not have  insurance, or whose insurance does not completely cover the cost of the ZIO monitor.  You must apply for the Patient Assistance Program to qualify for this discounted rate.  To apply, please call [employer] at [phone], select option 4, select option 2, ask to apply for  Patient Assistance Program. Theodore Demark will ask your household income, and how many people  are in your household. They will quote your out-of-pocket cost based on that information.  [employer] will also be able to set  up a 41-month, interest-free payment plan if needed.  Applying the monitor   Shave hair from upper left chest.  Hold abrader disc by orange  tab. Rub abrader in 40 strokes over the upper left chest as  indicated in your monitor instructions.  Clean area with 4 enclosed alcohol pads. Let dry.  Apply patch as indicated in monitor instructions. Patch will be placed under collarbone on left  side of chest with arrow pointing upward.  Rub patch adhesive wings for 2 minutes. Remove white label marked "1". Remove the white  label marked "2". Rub patch adhesive wings for 2 additional minutes.  While looking in a mirror, press and release button in center of patch. A small green light will  flash 3-4 times. This will be your only indicator that the monitor has been turned on.  Do not shower for the first 24 hours. You may shower after the first 24 hours.  Press the button if you feel a symptom. You will hear a small click. Record Date, Time and  Symptom in the Patient Logbook.  When you are ready to remove the patch, follow instructions on the last 2 pages of Patient  Logbook. Stick patch monitor onto the last page of Patient Logbook.  Place Patient Logbook in the blue and white box. Use locking tab on box and tape box closed  securely. The blue and white box has prepaid postage on it. Please place it in the mailbox as  soon as possible. Your physician should have your test results approximately 7 days after the  monitor has been mailed back to [facility].  Call [employer] at [phone] if you have questions regarding  your ZIO XT patch monitor. Call them immediately if you see an orange light blinking on your  monitor.  If your monitor falls off in less than 4 days, contact our Monitor department at [phone].  If your monitor becomes loose or falls off after 4 days call [employer] at [phone] for  suggestions on securing your monitor  Follow-Up: At [facility], you and your health needs are our priority.  As part of our continuing mission to provide you with exceptional heart care, we have created designated Provider Care Teams.  These Care Teams include your primary Cardiologist (physician) and Advanced Practice Providers (APPs -  Physician Assistants and Nurse Practitioners) who all work together to provide you with the care you need, when you need it.  We recommend signing up for the patient portal called "MyChart".  Sign up information is provided on this After Visit Summary.  MyChart is used to connect with patients for Virtual Visits (Telemedicine).  Patients are able to view lab/test results, encounter notes, upcoming appointments, etc.  Non-urgent messages can be sent to your provider as well.   To learn more about what you can do with MyChart, go to [email or web address].    Your next appointment:   Follow up will be determined after the above testing has been completed.  Thank you for choosing [facility]!!     Signed, Candee Furbish, MD  12/13/2020 1:25 PM    [facility]

## 2020-12-13 NOTE — Assessment & Plan Note (Signed)
Thankfully she has not had any further episodes of syncope.  Certainly this could have been attributed to slight dehydration with her creatinine increase noted as well as pain and could have triggered a vagal type response.  However given her advanced age, I would like to check a Zio patch monitor for 14 days to make sure she does not have any adverse conduction abnormalities.  Her EKG looks unremarkable.  Echocardiogram unremarkable.  Carotid Dopplers with mild plaque.  We will follow-up with results of study

## 2020-12-17 ENCOUNTER — Ambulatory Visit (HOSPITAL_COMMUNITY)
Admission: RE | Admit: 2020-12-17 | Discharge: 2020-12-17 | Disposition: A | Discharge status: Home / Self Care | Payer: Medicare Other | Source: Ambulatory Visit | Attending: Urology | Admitting: Urology

## 2020-12-17 ENCOUNTER — Other Ambulatory Visit: Payer: Self-pay

## 2020-12-17 DIAGNOSIS — R3129 Other microscopic hematuria: Secondary | ICD-10-CM | POA: Insufficient documentation

## 2020-12-24 ENCOUNTER — Other Ambulatory Visit: Payer: Self-pay

## 2020-12-24 ENCOUNTER — Encounter: Payer: Self-pay | Admitting: Urology

## 2020-12-24 ENCOUNTER — Ambulatory Visit (INDEPENDENT_AMBULATORY_CARE_PROVIDER_SITE_OTHER): Payer: Medicare Other | Admitting: Urology

## 2020-12-24 VITALS — BP 126/63 | HR 86

## 2020-12-24 DIAGNOSIS — R3129 Other microscopic hematuria: Secondary | ICD-10-CM

## 2020-12-24 MED ORDER — CIPROFLOXACIN HCL 500 MG PO TABS
500.0000 mg | ORAL_TABLET | Freq: Once | ORAL | Status: AC
Start: 2020-12-24 — End: 2020-12-24
  Administered 2020-12-24: 500 mg via ORAL

## 2020-12-24 NOTE — Progress Notes (Signed)
Urological Symptom Review  Patient is experiencing the following symptoms: Frequent urination Get up at night to urinate Stream starts and stops Weak stream   Review of Systems  Gastrointestinal (upper)  : Negative for upper GI symptoms  Gastrointestinal (lower) : Negative for lower GI symptoms  Constitutional : Negative for symptoms  Skin: Negative for skin symptoms  Eyes: Negative for eye symptoms  Ear/Nose/Throat : Negative for Ear/Nose/Throat symptoms  Hematologic/Lymphatic: Negative for Hematologic/Lymphatic symptoms  Cardiovascular : Negative for cardiovascular symptoms  Respiratory : Negative for respiratory symptoms  Endocrine: Negative for endocrine symptoms  Musculoskeletal: Back pain  Neurological: Negative for neurological symptoms  Psychologic: Negative for psychiatric symptoms

## 2020-12-24 NOTE — Progress Notes (Signed)
   12/24/20  CC: No chief complaint on file.   HPI:  F/u -   1) microscopic hematuria-in 2019 she had a UA with 21-50 red cells.  More recently it has been clear.  She has had some back pain.  A July 2022 KUB was negative. August 2022 L-spine MRI showed L1-L2 and L2-L3 disease.  There was no hydronephrosis on the visualized portion of the kidneys. She has had no gross hematuria. No h/o kidney stones. Non-smoker. She did work in [employer] and was occasionally in the dye room.   She underwent a CT scan of your abdomen and pelvis December 17, 2020 which was benign.  She did have a cystocele.   Of note she was admitted to hospital September 2022 shortly after I saw her with neurologic issues.  CT head showed a 2.2 cm pituitary mass and 2.5 cm left frontal extra-axial mass most in keeping with a meningioma demonstrating minimal mass-effect upon the right frontal lobe.      Blood pressure 126/63, pulse 86. NED. A&Ox3.   No respiratory distress   Abd soft, NT, ND Normal external genitalia with patent urethral meatus, grade II-III cystocele Chaperone- Amanda for exam and cysto   Cystoscopy Procedure Note  Patient identification was confirmed, informed consent was obtained, and patient was prepped using Betadine solution.  Lidocaine jelly was administered per urethral meatus.    Procedure: - Flexible cystoscope introduced, without any difficulty.   - Thorough search of the bladder revealed:    normal urethral meatus    normal urothelium    no stones    no ulcers     no tumors    no urethral polyps    no trabeculation  - Ureteral orifices were normal in position and appearance.  Post-Procedure: - Patient tolerated the procedure well  Assessment/ Plan: Microscopic hematuria-she had a benign evaluation.  I will see her back in 1 year or sooner if she has any issues.   No follow-ups on file.  Festus Aloe, MD

## 2021-01-08 ENCOUNTER — Other Ambulatory Visit (HOSPITAL_COMMUNITY): Payer: Self-pay | Admitting: Internal Medicine

## 2021-01-08 DIAGNOSIS — Z1231 Encounter for screening mammogram for malignant neoplasm of breast: Secondary | ICD-10-CM

## 2021-01-17 ENCOUNTER — Ambulatory Visit (HOSPITAL_COMMUNITY)
Admission: RE | Admit: 2021-01-17 | Discharge: 2021-01-17 | Disposition: A | Discharge status: Home / Self Care | Payer: Medicare Other | Source: Ambulatory Visit | Attending: Internal Medicine | Admitting: Internal Medicine

## 2021-01-17 ENCOUNTER — Other Ambulatory Visit: Payer: Self-pay

## 2021-01-17 DIAGNOSIS — Z1231 Encounter for screening mammogram for malignant neoplasm of breast: Secondary | ICD-10-CM

## 2021-02-25 ENCOUNTER — Other Ambulatory Visit: Payer: Self-pay

## 2021-02-25 ENCOUNTER — Ambulatory Visit (HOSPITAL_COMMUNITY): Payer: Medicare Other | Attending: Physical Medicine and Rehabilitation | Admitting: Physical Therapy

## 2021-02-25 DIAGNOSIS — M5416 Radiculopathy, lumbar region: Secondary | ICD-10-CM | POA: Insufficient documentation

## 2021-02-25 DIAGNOSIS — M6281 Muscle weakness (generalized): Secondary | ICD-10-CM | POA: Diagnosis present

## 2021-02-25 NOTE — Therapy (Addendum)
[place], [place], [address] Phone: [phone]   Fax:  [phone]  Physical Therapy Evaluation  Patient Details  Name: Diane Torres MRN: [id number] Date of Birth: 1940/08/19 Referring Provider (PT): Laroy Apple   Encounter Date: 02/25/2021   PT End of Session - 02/25/21 1400     Visit Number 1    Number of Visits 8    Date for PT Re-Evaluation 03/27/21    Authorization Type UHC medicare    PT Start Time 1315    PT Stop Time 9758    PT Time Calculation (min) 42 min    Activity Tolerance Patient tolerated treatment well    Behavior During Therapy [facility] for tasks assessed/performed             Past Medical History:  Diagnosis Date   CAP (community acquired pneumonia) 05/2018   Diverticulitis    CT verified in 2015, 2018, 2019, 2020   GERD (gastroesophageal reflux disease)    Hypothyroidism    Impaired fasting glucose    Mixed hyperlipidemia     Past Surgical History:  Procedure Laterality Date   COLONOSCOPY     COLONOSCOPY N/A 12/12/2015   Dr. Gala Romney: one 3 mm tubular adenoma in rectum s/p removal. Diverticulosis in entire colon. Internal hemorrhoids.    ESOPHAGOGASTRODUODENOSCOPY N/A 03/19/2016   Dr. Gala Romney: LA Grade A esophagitis, empiric dilation, normal stomach, normal duodenum   Left lobe thyroidectomy     MALONEY DILATION N/A 03/19/2016   Procedure: Venia Minks DILATION;  Surgeon: Daneil Dolin, MD;  [facility];  Service: Endoscopy;  Laterality: N/A;   POLYPECTOMY  12/12/2015   Procedure: POLYPECTOMY;  Surgeon: Daneil Dolin, MD;  [facility];  Service: Endoscopy;;  colon   RECTOCELE REPAIR     TOTAL ABDOMINAL HYSTERECTOMY W/ BILATERAL SALPINGOOPHORECTOMY      There were no vitals filed for this visit.    Subjective Assessment - 02/25/21 1327     Subjective Diane Torres has had back pain since August 03, 2020 when she went to an exercise class.  The pain is better now  but is still affecting her ability to stand for prolong time or come sit to stand.  She had an injection last week and has had no pain since the injection.  Prior to the injection she was having pain B with radicular pain to her knee.    Pertinent History OA, HTN,    Limitations Standing;Walking;House hold activities    How long can you sit comfortably? no problem    How long can you stand comfortably? Pt would start hurting within a couple of minutes but would finish the job.    How long can you walk comfortably? pt does not walk except at the store with a cart.    Patient Stated Goals leg to be stronger,    Currently in Pain? No/denies   prior to the injection if she was standing the pain would go up to an 8 or 9               [facility] PT Assessment - 02/25/21 0001       Assessment   Medical Diagnosis Low back pain    Referring Provider (PT) Laroy Apple    Onset Date/Surgical Date --   acute exacerbation in December.   Next MD Visit not scheduled    Prior Therapy no      Restrictions  Weight Bearing Restrictions No      Balance Screen   Has the patient fallen in the past 6 months No    Has the patient had a decrease in activity level because of a fear of falling?  Yes    Is the patient reluctant to leave their home because of a fear of falling?  No      Prior Function   Level of Independence Independent      Cognition   Overall Cognitive Status Within Functional Limits for tasks assessed      Observation/Other Assessments   Focus on Therapeutic Outcomes (FOTO)  58, 42% affected.      Functional Tests   Functional tests Sit to Stand;Single leg stance      Single Leg Stance   Comments Rt 37" LT 31"      Sit to Stand   Comments 9 in 30 seconds      Posture/Postural Control   Posture/Postural Control Postural limitations    Postural Limitations Rounded Shoulders;Forward head;Decreased lumbar lordosis;Increased thoracic kyphosis      ROM / Strength   AROM / PROM  / Strength Strength;AROM      AROM   AROM Assessment Site Lumbar    Lumbar Flexion fingers 3" from floor    Lumbar Extension 28      Strength   Strength Assessment Site Hip;Knee    Right/Left Hip Right;Left    Right Hip Flexion 5/5    Right Hip Extension 3/5    Right Hip ABduction 3/5    Left Hip Flexion 5/5    Left Hip Extension 5/5    Left Hip ABduction 5/5    Right/Left Knee Right;Left    Right Knee Flexion 4/5    Right Knee Extension 5/5    Left Knee Flexion 5/5    Left Knee Extension 5/5                        Objective measurements completed on examination: See above findings.       [facility] Adult PT Treatment/Exercise - 02/25/21 0001       Exercises   Exercises Lumbar      Lumbar Exercises: Stretches   Single Knee to Chest Stretch Left;Right;3 reps;30 seconds      Lumbar Exercises: Supine   Ab Set 5 reps    Bridge 10 reps      Lumbar Exercises: Sidelying   Hip Abduction Right;5 reps                 Clinical Impression Statement Diane Torres is an 81 yo female who states that she has been having Low back pain and weakness in her leg for several months. She has been referred for evaluation and treatment. Evaluation demonstrates decreased ROM, decreased core and LE strength, decreased functional tolerance as well as increased pain. She will benefit from skilled PT to address these issues and maximize her functional ability.  Personal Factors and Comorbidities Comorbidity 2; Fitness; Time since onset of injury/illness/exacerbation; Age  Comorbidities --  Examination-Activity Limitations Locomotion Level; Stand  Examination-Participation Restrictions Cleaning; Meal Prep; Laundry; Shop  Pt will benefit from skilled therapeutic intervention in order to improve on the following deficits Decreased activity tolerance; Decreased balance; Decreased strength; Difficulty walking; Pain; Decreased range of motion  Stability/Clinical Decision Making  Stable/Uncomplicated  Clinical Decision Making Moderate  Rehab Potential Good  PT Frequency 2x / week  PT Duration 4 weeks  PT Treatment/Interventions  ADLs/Self Care Home Management; Patient/family education; Manual techniques; Therapeutic activities; Therapeutic exercise  PT Next Visit Plan educate in body mechanics for getting items out of lower cabinets, dryer, continue stretching and core strengthening  PT Home Exercise Plan ab set, knee to chest, bridge, hip abduction            PT Education - 02/25/21 1400     Education Details HEP    Person(s) Educated Patient    Methods Explanation;Handout;Verbal cues;Tactile cues    Comprehension Verbalized understanding;Returned demonstration               PT Short Term Goals - 02/25/21 1453       PT SHORT TERM GOAL #1   Title PT to be I in HEP to allow back pain to be no greater than a 5/10 with standing activities    Time 2    Period Weeks    Status New    Target Date 03/08/21      PT SHORT TERM GOAL #2   Title PT to be able to stand for ten minutes prior to having any increased back pain    Time 2    Period Weeks    Status New               PT Long Term Goals - 02/25/21 1454       PT LONG TERM GOAL #1   Title PT to be I in advanced HEP to include body mechanics for getting laundry out of dryer.    Time 4    Period Weeks    Status New    Target Date 03/22/21      PT LONG TERM GOAL #2   Title PT to have had no radicular sx in the past week to demonstrate decreased nerve irritation.    Time 4    Period Weeks    Status New      PT LONG TERM GOAL #3   Title PT hip mm to be at least 4+/5 to allow pt to note that she is able to get sit to stand from lower chairs without difficulty.    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[place]) 10/28/2020   Intracranial mass 10/28/2020   Macrocytic anemia 10/28/2020   Chronic back pain 10/28/2020   Essential hypertension 10/28/2020   Syncope 10/27/2020   Diarrhea 12/02/2018   Diverticulosis 12/02/2018   Acute diverticulitis 06/30/2017   Rectal bleeding 06/30/2017   Leukocytosis 06/30/2017   CKD (chronic kidney disease) stage 3, GFR 30-59 ml/min (HCC) 06/30/2017   LLQ pain 12/23/2016   GERD (gastroesophageal reflux disease) 06/16/2016   Gastroesophageal reflux disease with esophagitis 06/16/2016   Precordial pain 03/10/2013   Impaired fasting glucose 03/09/2013   Mixed hyperlipidemia 03/09/2013   Hypothyroidism 03/09/2013   Rayetta Humphrey, PT CLT [phone] 02/25/2021, 2:58 PM  [place], [place], [address] Phone: [phone]   Fax:  [phone]  Name: Diane Torres MRN: [id number] Date of Birth: Feb 23, 1940

## 2021-02-27 ENCOUNTER — Ambulatory Visit (HOSPITAL_COMMUNITY): Payer: Medicare Other | Admitting: Physical Therapy

## 2021-02-27 ENCOUNTER — Other Ambulatory Visit: Payer: Self-pay

## 2021-02-27 ENCOUNTER — Encounter (HOSPITAL_COMMUNITY): Payer: Self-pay | Admitting: Physical Therapy

## 2021-02-27 DIAGNOSIS — M5416 Radiculopathy, lumbar region: Secondary | ICD-10-CM

## 2021-02-27 DIAGNOSIS — M6281 Muscle weakness (generalized): Secondary | ICD-10-CM

## 2021-02-27 NOTE — Therapy (Signed)
[place], [place], [address] Phone: [phone]   Fax:  [phone]  Physical Therapy Treatment  Patient Details  Name: Diane Torres MRN: [id number] Date of Birth: 08-30-1940 Referring Provider (PT): Laroy Apple   Encounter Date: 02/27/2021   PT End of Session - 02/27/21 1508     Visit Number 2    Number of Visits 8    Date for PT Re-Evaluation 03/27/21    Authorization Type UHC medicare    PT Start Time 2542    PT Stop Time 7062    PT Time Calculation (min) 40 min    Activity Tolerance Patient tolerated treatment well    Behavior During Therapy [facility] for tasks assessed/performed             Past Medical History:  Diagnosis Date   CAP (community acquired pneumonia) 05/2018   Diverticulitis    CT verified in 2015, 2018, 2019, 2020   GERD (gastroesophageal reflux disease)    Hypothyroidism    Impaired fasting glucose    Mixed hyperlipidemia     Past Surgical History:  Procedure Laterality Date   COLONOSCOPY     COLONOSCOPY N/A 12/12/2015   Dr. Gala Romney: one 3 mm tubular adenoma in rectum s/p removal. Diverticulosis in entire colon. Internal hemorrhoids.    ESOPHAGOGASTRODUODENOSCOPY N/A 03/19/2016   Dr. Gala Romney: LA Grade A esophagitis, empiric dilation, normal stomach, normal duodenum   Left lobe thyroidectomy     MALONEY DILATION N/A 03/19/2016   Procedure: Venia Minks DILATION;  Surgeon: Daneil Dolin, MD;  [facility];  Service: Endoscopy;  Laterality: N/A;   POLYPECTOMY  12/12/2015   Procedure: POLYPECTOMY;  Surgeon: Daneil Dolin, MD;  [facility];  Service: Endoscopy;;  colon   RECTOCELE REPAIR     TOTAL ABDOMINAL HYSTERECTOMY W/ BILATERAL SALPINGOOPHORECTOMY      There were no vitals filed for this visit.   Subjective Assessment - 02/27/21 1511     Subjective PT states that she is a little sore from the exercises.    Pertinent History OA, HTN,    Limitations  Standing;Walking;House hold activities    How long can you sit comfortably? no problem    How long can you stand comfortably? Pt would start hurting within a couple of minutes but would finish the job.    How long can you walk comfortably? pt does not walk except at the store with a cart.    Patient Stated Goals leg to be stronger,    Currently in Pain? No/denies                               [facility] Adult PT Treatment/Exercise - 02/27/21 0001       Exercises   Exercises Lumbar      Lumbar Exercises: Stretches   Active Hamstring Stretch Right;Left;3 reps;30 seconds    Single Knee to Chest Stretch Left;Right;3 reps;30 seconds    Other Lumbar Stretch Exercise hip excursion      Lumbar Exercises: Standing   Heel Raises 10 reps    Functional Squats 10 reps    Other Standing Lumbar Exercises Rt hip abduction/ extensin x 10 B      Lumbar Exercises: Seated   Sit to Stand 10 reps      Lumbar Exercises: Supine   Bent Knee Raise 10 reps    Bridge 10 reps  Lumbar Exercises: Sidelying   Clam 10 reps    Hip Abduction 10 reps                       PT Short Term Goals - 02/27/21 1509       PT SHORT TERM GOAL #1   Title PT to be I in HEP to allow back pain to be no greater than a 5/10 with standing activities    Time 2    Period Weeks    Status On-going    Target Date 03/08/21      PT SHORT TERM GOAL #2   Title PT to be able to stand for ten minutes prior to having any increased back pain    Time 2    Period Weeks    Status On-going               PT Long Term Goals - 02/27/21 1509       PT LONG TERM GOAL #1   Title PT to be I in advanced HEP to include body mechanics for getting laundry out of dryer.    Time 4    Period Weeks    Status On-going    Target Date 03/22/21      PT LONG TERM GOAL #2   Title PT to have had no radicular sx in the past week to demonstrate decreased nerve irritation.    Time 4    Period Weeks     Status On-going      PT LONG TERM GOAL #3   Title PT hip mm to be at least 4+/5 to allow pt to note that she is able to get sit to stand from lower chairs without difficulty.    Time 4    Period Weeks    Status On-going                   Plan - 02/27/21 1510     Clinical Impression Statement Reviewed evaluation and goals with pt.  Educated on proper body mechanics for bed mobility and lifting.  Began wb activity and stretching.  Pt able to complete new stab exercises with verbal cuing for correct technique.    Personal Factors and Comorbidities Comorbidity 2;Fitness;Time since onset of injury/illness/exacerbation;Age    Examination-Activity Limitations Locomotion Level;Stand    Examination-Participation Restrictions Cleaning;Meal Prep;Laundry;Shop    Stability/Clinical Decision Making Stable/Uncomplicated    Rehab Potential Good    PT Frequency 2x / week    PT Duration 4 weeks    PT Treatment/Interventions ADLs/Self Care Home Management;Patient/family education;Manual techniques;Therapeutic activities;Therapeutic exercise    PT Next Visit Plan , continue stretching and core strengthening    PT Home Exercise Plan ab set, knee to chest, bridge, hip abduction; 1/25 bent knee raise , clam             Patient will benefit from skilled therapeutic intervention in order to improve the following deficits and impairments:  Decreased activity tolerance, Decreased balance, Decreased strength, Difficulty walking, Pain, Decreased range of motion  Visit Diagnosis: Radiculopathy, lumbar region  Muscle weakness (generalized)     Problem List Patient Active Problem List   Diagnosis Date Noted   Pituitary mass ([place]) 10/28/2020   Intracranial mass 10/28/2020   Macrocytic anemia 10/28/2020   Chronic back pain 10/28/2020   Essential hypertension 10/28/2020   Syncope 10/27/2020   Diarrhea 12/02/2018   Diverticulosis 12/02/2018   Acute diverticulitis 06/30/2017   Rectal bleeding  06/30/2017   Leukocytosis 06/30/2017   CKD (chronic kidney disease) stage 3, GFR 30-59 ml/min (HCC) 06/30/2017   LLQ pain 12/23/2016   GERD (gastroesophageal reflux disease) 06/16/2016   Gastroesophageal reflux disease with esophagitis 06/16/2016   Precordial pain 03/10/2013   Impaired fasting glucose 03/09/2013   Mixed hyperlipidemia 03/09/2013   Hypothyroidism 03/09/2013   Rayetta Humphrey, PT CLT [phone]  02/27/2021, 3:57 PM  [place], [place], [address] Phone: [phone]   Fax:  [phone]  Name: Diane Torres MRN: [id number] Date of Birth: 06/05/1940

## 2021-03-06 ENCOUNTER — Other Ambulatory Visit: Payer: Self-pay

## 2021-03-06 ENCOUNTER — Encounter (HOSPITAL_COMMUNITY): Payer: Self-pay | Admitting: Physical Therapy

## 2021-03-06 ENCOUNTER — Ambulatory Visit (HOSPITAL_COMMUNITY): Payer: Medicare Other | Attending: Physical Medicine and Rehabilitation | Admitting: Physical Therapy

## 2021-03-06 DIAGNOSIS — M5416 Radiculopathy, lumbar region: Secondary | ICD-10-CM | POA: Diagnosis not present

## 2021-03-06 DIAGNOSIS — M6281 Muscle weakness (generalized): Secondary | ICD-10-CM | POA: Insufficient documentation

## 2021-03-06 NOTE — Therapy (Signed)
[place], [place], [address] Phone: [phone]   Fax:  [phone]  Physical Therapy Treatment  Patient Details  Name: Diane Torres MRN: [id number] Date of Birth: 15-Jan-1941 Referring Provider (PT): Laroy Apple   Encounter Date: 03/06/2021   PT End of Session - 03/06/21 1533     Visit Number 3    Number of Visits 8    Date for PT Re-Evaluation 03/27/21    Authorization Type UHC medicare    PT Start Time 5956    PT Stop Time 1600    PT Time Calculation (min) 25 min    Activity Tolerance Patient tolerated treatment well    Behavior During Therapy [facility] for tasks assessed/performed             Past Medical History:  Diagnosis Date   CAP (community acquired pneumonia) 05/2018   Diverticulitis    CT verified in 2015, 2018, 2019, 2020   GERD (gastroesophageal reflux disease)    Hypothyroidism    Impaired fasting glucose    Mixed hyperlipidemia     Past Surgical History:  Procedure Laterality Date   COLONOSCOPY     COLONOSCOPY N/A 12/12/2015   Dr. Gala Romney: one 3 mm tubular adenoma in rectum s/p removal. Diverticulosis in entire colon. Internal hemorrhoids.    ESOPHAGOGASTRODUODENOSCOPY N/A 03/19/2016   Dr. Gala Romney: LA Grade A esophagitis, empiric dilation, normal stomach, normal duodenum   Left lobe thyroidectomy     MALONEY DILATION N/A 03/19/2016   Procedure: Venia Minks DILATION;  Surgeon: Daneil Dolin, MD;  [facility];  Service: Endoscopy;  Laterality: N/A;   POLYPECTOMY  12/12/2015   Procedure: POLYPECTOMY;  Surgeon: Daneil Dolin, MD;  [facility];  Service: Endoscopy;;  colon   RECTOCELE REPAIR     TOTAL ABDOMINAL HYSTERECTOMY W/ BILATERAL SALPINGOOPHORECTOMY      There were no vitals filed for this visit.   Subjective Assessment - 03/06/21 1533     Subjective PT states that she could hardly walk after the exercises on Wed.  Even Thursday she had increased pain.     Pertinent History OA, HTN,    Limitations Standing;Walking;House hold activities    How long can you sit comfortably? no problem    How long can you stand comfortably? Pt would start hurting within a couple of minutes but would finish the job.    How long can you walk comfortably? pt does not walk except at the store with a cart.    Patient Stated Goals leg to be stronger,    Currently in Pain? Yes    Pain Score 5     Pain Location Back    Pain Orientation Lower    Pain Descriptors / Indicators Aching    Pain Radiating Towards not today but it was radiationg down her left leg.                               [facility] Adult PT Treatment/Exercise - 03/06/21 0001       Lumbar Exercises: Stretches   Single Knee to Chest Stretch Left;Right;3 reps;30 seconds    Lower Trunk Rotation 5 reps      Lumbar Exercises: Supine   Ab Set 10 reps    Glut Set 10 reps    Bent Knee Raise 10 reps    Bridge 10 reps    Other Supine Lumbar  Exercises hip adduction isometric x 10    Other Supine Lumbar Exercises scapular retraction      Modalities   Modalities Moist Heat      Moist Heat Therapy   Number Minutes Moist Heat 15 Minutes    Moist Heat Location Lumbar Spine                       PT Short Term Goals - 02/27/21 1509       PT SHORT TERM GOAL #1   Title PT to be I in HEP to allow back pain to be no greater than a 5/10 with standing activities    Time 2    Period Weeks    Status On-going    Target Date 03/08/21      PT SHORT TERM GOAL #2   Title PT to be able to stand for ten minutes prior to having any increased back pain    Time 2    Period Weeks    Status On-going               PT Long Term Goals - 02/27/21 1509       PT LONG TERM GOAL #1   Title PT to be I in advanced HEP to include body mechanics for getting laundry out of dryer.    Time 4    Period Weeks    Status On-going    Target Date 03/22/21      PT LONG TERM GOAL #2   Title  PT to have had no radicular sx in the past week to demonstrate decreased nerve irritation.    Time 4    Period Weeks    Status On-going      PT LONG TERM GOAL #3   Title PT hip mm to be at least 4+/5 to allow pt to note that she is able to get sit to stand from lower chairs without difficulty.    Time 4    Period Weeks    Status On-going                   Plan - 03/06/21 1544     Clinical Impression Statement Pt with significant increased pain therefore therapist started session with isometrics with pt on HMP to relax low back mm.  Therapist  kept exercises Nonweight bearing due to pain.  PT able to tolerate additional exercises.    Personal Factors and Comorbidities Comorbidity 2;Fitness;Time since onset of injury/illness/exacerbation;Age    Examination-Activity Limitations Locomotion Level;Stand    Examination-Participation Restrictions Cleaning;Meal Prep;Laundry;Shop    Stability/Clinical Decision Making Stable/Uncomplicated    Clinical Decision Making Moderate    Rehab Potential Good    PT Frequency 2x / week    PT Treatment/Interventions ADLs/Self Care Home Management;Patient/family education;Manual techniques;Therapeutic activities;Therapeutic exercise    PT Next Visit Plan ,  Return to weight bearing exercises.  continue stretching and core strengthening    PT Home Exercise Plan ab set, knee to chest, bridge, hip abduction; 1/25 bent knee raise , clam/ 2/1/:  glut sets, hip adduction isometric, supine scapular retracion.             Patient will benefit from skilled therapeutic intervention in order to improve the following deficits and impairments:  Decreased activity tolerance, Decreased balance, Decreased strength, Difficulty walking, Pain, Decreased range of motion  Visit Diagnosis: Radiculopathy, lumbar region     Problem List Patient Active Problem List   Diagnosis Date Noted  Pituitary mass ([place]) 10/28/2020   Intracranial mass 10/28/2020    Macrocytic anemia 10/28/2020   Chronic back pain 10/28/2020   Essential hypertension 10/28/2020   Syncope 10/27/2020   Diarrhea 12/02/2018   Diverticulosis 12/02/2018   Acute diverticulitis 06/30/2017   Rectal bleeding 06/30/2017   Leukocytosis 06/30/2017   CKD (chronic kidney disease) stage 3, GFR 30-59 ml/min (HCC) 06/30/2017   LLQ pain 12/23/2016   GERD (gastroesophageal reflux disease) 06/16/2016   Gastroesophageal reflux disease with esophagitis 06/16/2016   Precordial pain 03/10/2013   Impaired fasting glucose 03/09/2013   Mixed hyperlipidemia 03/09/2013   Hypothyroidism 03/09/2013   Rayetta Humphrey, PT CLT [phone]  03/06/2021, 4:45 PM  [place], [place], [address] Phone: [phone]   Fax:  [phone]  Name: Diane Torres MRN: [id number] Date of Birth: [birth year]

## 2021-03-07 ENCOUNTER — Ambulatory Visit (HOSPITAL_COMMUNITY): Payer: Medicare Other | Admitting: Physical Therapy

## 2021-03-07 DIAGNOSIS — M6281 Muscle weakness (generalized): Secondary | ICD-10-CM

## 2021-03-07 DIAGNOSIS — M5416 Radiculopathy, lumbar region: Secondary | ICD-10-CM

## 2021-03-07 NOTE — Therapy (Signed)
[place], [place], [address] Phone: [phone]   Fax:  [phone]  Physical Therapy Treatment  Patient Details  Name: Diane Torres MRN: [id number] Date of Birth: Nov 15, 1940 Referring Provider (PT): Laroy Apple   Encounter Date: 03/07/2021   PT End of Session - 03/07/21 1434     Visit Number 4    Number of Visits 8    Date for PT Re-Evaluation 03/27/21    Authorization Type [employer] medicare    PT Start Time 1403    PT Stop Time 7035    PT Time Calculation (min) 39 min    Activity Tolerance Patient tolerated treatment well    Behavior During Therapy [facility] for tasks assessed/performed             Past Medical History:  Diagnosis Date   CAP (community acquired pneumonia) 05/2018   Diverticulitis    CT verified in 2015, 2018, 2019, 2020   GERD (gastroesophageal reflux disease)    Hypothyroidism    Impaired fasting glucose    Mixed hyperlipidemia     Past Surgical History:  Procedure Laterality Date   COLONOSCOPY     COLONOSCOPY N/A 12/12/2015   Dr. Gala Romney: one 3 mm tubular adenoma in rectum s/p removal. Diverticulosis in entire colon. Internal hemorrhoids.    ESOPHAGOGASTRODUODENOSCOPY N/A 03/19/2016   Dr. Gala Romney: LA Grade A esophagitis, empiric dilation, normal stomach, normal duodenum   Left lobe thyroidectomy     MALONEY DILATION N/A 03/19/2016   Procedure: Venia Minks DILATION;  Surgeon: Daneil Dolin, MD;  [facility];  Service: Endoscopy;  Laterality: N/A;   POLYPECTOMY  12/12/2015   Procedure: POLYPECTOMY;  Surgeon: Daneil Dolin, MD;  Location: [facility];  Service: Endoscopy;;  colon   RECTOCELE REPAIR     TOTAL ABDOMINAL HYSTERECTOMY W/ BILATERAL SALPINGOOPHORECTOMY      There were no vitals filed for this visit.   Subjective Assessment - 03/07/21 1402     Subjective Pt states that she might be a little better than yesterday.    Pertinent History OA, HTN,    Limitations  Standing;Walking;House hold activities    How long can you sit comfortably? no problem    How long can you stand comfortably? Pt would start hurting within a couple of minutes but would finish the job.    How long can you walk comfortably? pt does not walk except at the store with a cart.    Patient Stated Goals leg to be stronger,    Currently in Pain? Yes    Pain Score 4     Pain Location Back    Pain Orientation Lower    Pain Descriptors / Indicators Aching    Pain Radiating Towards none    Pain Onset More than a month ago    Pain Frequency Intermittent    Aggravating Factors  weight bearing and sit to stand    Pain Relieving Factors meds    Effect of Pain on Daily Activities limits                               [facility] Adult PT Treatment/Exercise - 03/07/21 0001       Exercises   Exercises Lumbar      Lumbar Exercises: Stretches   Single Knee to Chest Stretch Left;Right;3 reps;30 seconds    Lower Trunk Rotation 5 reps  Other Lumbar Stretch Exercise hip excursion      Lumbar Exercises: Standing   Heel Raises 10 reps      Lumbar Exercises: Seated   Sit to Stand 10 reps      Lumbar Exercises: Supine   Ab Set 10 reps    AB Set Limitations combined with gluteal sets    Clam 10 reps    Dead Bug 10 reps    Bridge 15 reps    Other Supine Lumbar Exercises hip adduction isometric x 10    Other Supine Lumbar Exercises hip abduction with green tband , scapular retraction      Modalities   Modalities Moist Heat      Moist Heat Therapy   Number Minutes Moist Heat 15 Minutes    Moist Heat Location Lumbar Spine                       PT Short Term Goals - 02/27/21 1509       PT SHORT TERM GOAL #1   Title PT to be I in HEP to allow back pain to be no greater than a 5/10 with standing activities    Time 2    Period Weeks    Status On-going    Target Date 03/08/21      PT SHORT TERM GOAL #2   Title PT to be able to stand for ten  minutes prior to having any increased back pain    Time 2    Period Weeks    Status On-going               PT Long Term Goals - 02/27/21 1509       PT LONG TERM GOAL #1   Title PT to be I in advanced HEP to include body mechanics for getting laundry out of dryer.    Time 4    Period Weeks    Status On-going    Target Date 03/22/21      PT LONG TERM GOAL #2   Title PT to have had no radicular sx in the past week to demonstrate decreased nerve irritation.    Time 4    Period Weeks    Status On-going      PT LONG TERM GOAL #3   Title PT hip mm to be at least 4+/5 to allow pt to note that she is able to get sit to stand from lower chairs without difficulty.    Time 4    Period Weeks    Status On-going                   Plan - 03/07/21 1434     Clinical Impression Statement Pt able to increase exercises to include WB exercises today.  Continued use of HMP to assist in decreasing pt pain.  PT continue to complete her Home exercise program    Personal Factors and Comorbidities Comorbidity 2;Fitness;Time since onset of injury/illness/exacerbation;Age    Examination-Activity Limitations Locomotion Level;Stand    Examination-Participation Restrictions Cleaning;Meal Prep;Laundry;Shop    Stability/Clinical Decision Making Stable/Uncomplicated    Rehab Potential Good    PT Frequency 2x / week    PT Treatment/Interventions ADLs/Self Care Home Management;Patient/family education;Manual techniques;Therapeutic activities;Therapeutic exercise    PT Next Visit Plan ,  Return to weight bearing exercises.  continue stretching and core strengthening    PT Home Exercise Plan ab set, knee to chest, bridge, hip abduction; 1/25 bent knee raise ,  clam/ 2/1/:  glut sets, hip adduction isometric, supine scapular retracion.2/2:  supine resisted hip abduction             Patient will benefit from skilled therapeutic intervention in order to improve the following deficits and  impairments:  Decreased activity tolerance, Decreased balance, Decreased strength, Difficulty walking, Pain, Decreased range of motion  Visit Diagnosis: Radiculopathy, lumbar region  Muscle weakness (generalized)     Problem List Patient Active Problem List   Diagnosis Date Noted   Pituitary mass ([place]) 10/28/2020   Intracranial mass 10/28/2020   Macrocytic anemia 10/28/2020   Chronic back pain 10/28/2020   Essential hypertension 10/28/2020   Syncope 10/27/2020   Diarrhea 12/02/2018   Diverticulosis 12/02/2018   Acute diverticulitis 06/30/2017   Rectal bleeding 06/30/2017   Leukocytosis 06/30/2017   CKD (chronic kidney disease) stage 3, GFR 30-59 ml/min (HCC) 06/30/2017   LLQ pain 12/23/2016   GERD (gastroesophageal reflux disease) 06/16/2016   Gastroesophageal reflux disease with esophagitis 06/16/2016   Precordial pain 03/10/2013   Impaired fasting glucose 03/09/2013   Mixed hyperlipidemia 03/09/2013   Hypothyroidism 03/09/2013   Rayetta Humphrey, PT CLT [phone]  03/07/2021, 2:43 PM  [place], [place], [address] Phone: [phone]   Fax:  [phone]  Name: Diane Torres MRN: [id number] Date of Birth: 01-04-1941

## 2021-03-12 ENCOUNTER — Encounter (HOSPITAL_COMMUNITY): Payer: Medicare Other | Admitting: Physical Therapy

## 2021-03-14 ENCOUNTER — Encounter (HOSPITAL_COMMUNITY): Payer: Medicare Other | Admitting: Physical Therapy

## 2021-03-15 ENCOUNTER — Telehealth (HOSPITAL_COMMUNITY): Payer: Self-pay | Admitting: Speech Pathology

## 2021-03-15 NOTE — Telephone Encounter (Signed)
Patient states she saw MD yesterday and they agreed she should be d/c and not continue PT

## 2021-03-19 ENCOUNTER — Encounter (HOSPITAL_COMMUNITY): Payer: Medicare Other | Admitting: Physical Therapy

## 2021-03-21 ENCOUNTER — Encounter (HOSPITAL_COMMUNITY): Payer: Medicare Other | Admitting: Physical Therapy

## 2021-03-26 ENCOUNTER — Encounter (HOSPITAL_COMMUNITY): Payer: Medicare Other | Admitting: Physical Therapy

## 2021-03-28 ENCOUNTER — Encounter (HOSPITAL_COMMUNITY): Payer: Medicare Other | Admitting: Physical Therapy

## 2021-04-02 ENCOUNTER — Encounter (HOSPITAL_COMMUNITY): Payer: Medicare Other | Admitting: Physical Therapy

## 2021-04-04 ENCOUNTER — Encounter (HOSPITAL_COMMUNITY): Payer: Medicare Other | Admitting: Physical Therapy

## 2021-04-06 ENCOUNTER — Emergency Department (HOSPITAL_COMMUNITY): Payer: Medicare Other

## 2021-04-06 ENCOUNTER — Inpatient Hospital Stay (HOSPITAL_COMMUNITY)
Admission: EM | Admit: 2021-04-06 | Discharge: 2021-04-15 | DRG: 871 | Disposition: A | Discharge status: Skilled Nursing Facility | Payer: Medicare Other | Attending: Family Medicine | Admitting: Family Medicine

## 2021-04-06 ENCOUNTER — Encounter (HOSPITAL_COMMUNITY): Payer: Self-pay | Admitting: Emergency Medicine

## 2021-04-06 ENCOUNTER — Other Ambulatory Visit: Payer: Self-pay

## 2021-04-06 DIAGNOSIS — M4802 Spinal stenosis, cervical region: Secondary | ICD-10-CM | POA: Diagnosis present

## 2021-04-06 DIAGNOSIS — E872 Acidosis, unspecified: Secondary | ICD-10-CM | POA: Diagnosis not present

## 2021-04-06 DIAGNOSIS — N179 Acute kidney failure, unspecified: Secondary | ICD-10-CM | POA: Diagnosis present

## 2021-04-06 DIAGNOSIS — I129 Hypertensive chronic kidney disease with stage 1 through stage 4 chronic kidney disease, or unspecified chronic kidney disease: Secondary | ICD-10-CM | POA: Diagnosis present

## 2021-04-06 DIAGNOSIS — E782 Mixed hyperlipidemia: Secondary | ICD-10-CM | POA: Diagnosis present

## 2021-04-06 DIAGNOSIS — K5732 Diverticulitis of large intestine without perforation or abscess without bleeding: Secondary | ICD-10-CM | POA: Diagnosis present

## 2021-04-06 DIAGNOSIS — E875 Hyperkalemia: Secondary | ICD-10-CM | POA: Diagnosis present

## 2021-04-06 DIAGNOSIS — K5792 Diverticulitis of intestine, part unspecified, without perforation or abscess without bleeding: Principal | ICD-10-CM | POA: Diagnosis present

## 2021-04-06 DIAGNOSIS — Z833 Family history of diabetes mellitus: Secondary | ICD-10-CM

## 2021-04-06 DIAGNOSIS — E861 Hypovolemia: Secondary | ICD-10-CM | POA: Diagnosis not present

## 2021-04-06 DIAGNOSIS — D631 Anemia in chronic kidney disease: Secondary | ICD-10-CM | POA: Diagnosis present

## 2021-04-06 DIAGNOSIS — Z20822 Contact with and (suspected) exposure to covid-19: Secondary | ICD-10-CM | POA: Diagnosis present

## 2021-04-06 DIAGNOSIS — M5126 Other intervertebral disc displacement, lumbar region: Secondary | ICD-10-CM | POA: Diagnosis present

## 2021-04-06 DIAGNOSIS — E860 Dehydration: Secondary | ICD-10-CM | POA: Diagnosis present

## 2021-04-06 DIAGNOSIS — M48061 Spinal stenosis, lumbar region without neurogenic claudication: Secondary | ICD-10-CM | POA: Diagnosis present

## 2021-04-06 DIAGNOSIS — Z79899 Other long term (current) drug therapy: Secondary | ICD-10-CM

## 2021-04-06 DIAGNOSIS — A419 Sepsis, unspecified organism: Principal | ICD-10-CM | POA: Diagnosis present

## 2021-04-06 DIAGNOSIS — K219 Gastro-esophageal reflux disease without esophagitis: Secondary | ICD-10-CM | POA: Diagnosis present

## 2021-04-06 DIAGNOSIS — R531 Weakness: Secondary | ICD-10-CM

## 2021-04-06 DIAGNOSIS — R062 Wheezing: Secondary | ICD-10-CM

## 2021-04-06 DIAGNOSIS — J9601 Acute respiratory failure with hypoxia: Secondary | ICD-10-CM | POA: Diagnosis present

## 2021-04-06 DIAGNOSIS — N1831 Chronic kidney disease, stage 3a: Secondary | ICD-10-CM | POA: Diagnosis present

## 2021-04-06 DIAGNOSIS — N183 Chronic kidney disease, stage 3 unspecified: Secondary | ICD-10-CM | POA: Diagnosis present

## 2021-04-06 DIAGNOSIS — D649 Anemia, unspecified: Secondary | ICD-10-CM | POA: Diagnosis present

## 2021-04-06 DIAGNOSIS — Z91030 Bee allergy status: Secondary | ICD-10-CM

## 2021-04-06 DIAGNOSIS — Z8249 Family history of ischemic heart disease and other diseases of the circulatory system: Secondary | ICD-10-CM

## 2021-04-06 DIAGNOSIS — N281 Cyst of kidney, acquired: Secondary | ICD-10-CM

## 2021-04-06 DIAGNOSIS — I10 Essential (primary) hypertension: Secondary | ICD-10-CM | POA: Diagnosis present

## 2021-04-06 DIAGNOSIS — E871 Hypo-osmolality and hyponatremia: Secondary | ICD-10-CM | POA: Diagnosis present

## 2021-04-06 LAB — CBC WITH DIFFERENTIAL/PLATELET
Abs Immature Granulocytes: 0.03 10*3/uL (ref 0.00–0.07)
Basophils Absolute: 0.1 10*3/uL (ref 0.0–0.1)
Basophils Relative: 1 %
Eosinophils Absolute: 0.1 10*3/uL (ref 0.0–0.5)
Eosinophils Relative: 1 %
HCT: 38.3 % (ref 36.0–46.0)
Hemoglobin: 11.8 g/dL — ABNORMAL LOW (ref 12.0–15.0)
Immature Granulocytes: 0 %
Lymphocytes Relative: 14 %
Lymphs Abs: 1.1 10*3/uL (ref 0.7–4.0)
MCH: 29.4 pg (ref 26.0–34.0)
MCHC: 30.8 g/dL (ref 30.0–36.0)
MCV: 95.5 fL (ref 80.0–100.0)
Monocytes Absolute: 1 10*3/uL (ref 0.1–1.0)
Monocytes Relative: 13 %
Neutro Abs: 5.6 10*3/uL (ref 1.7–7.7)
Neutrophils Relative %: 71 %
Platelets: 296 10*3/uL (ref 150–400)
RBC: 4.01 MIL/uL (ref 3.87–5.11)
RDW: 14.8 % (ref 11.5–15.5)
WBC: 7.8 10*3/uL (ref 4.0–10.5)
nRBC: 0 % (ref 0.0–0.2)

## 2021-04-06 LAB — COMPREHENSIVE METABOLIC PANEL
ALT: 14 U/L (ref 0–44)
AST: 17 U/L (ref 15–41)
Albumin: 3.9 g/dL (ref 3.5–5.0)
Alkaline Phosphatase: 62 U/L (ref 38–126)
Anion gap: 12 (ref 5–15)
BUN: 32 mg/dL — ABNORMAL HIGH (ref 8–23)
CO2: 20 mmol/L — ABNORMAL LOW (ref 22–32)
Calcium: 9.5 mg/dL (ref 8.9–10.3)
Chloride: 99 mmol/L (ref 98–111)
Creatinine, Ser: 1.67 mg/dL — ABNORMAL HIGH (ref 0.44–1.00)
GFR, Estimated: 31 mL/min — ABNORMAL LOW (ref >60)
Glucose, Bld: 95 mg/dL (ref 70–99)
Potassium: 5.7 mmol/L — ABNORMAL HIGH (ref 3.5–5.1)
Sodium: 131 mmol/L — ABNORMAL LOW (ref 135–145)
Total Bilirubin: 0.6 mg/dL (ref 0.3–1.2)
Total Protein: 8.1 g/dL (ref 6.5–8.1)

## 2021-04-06 LAB — PROTIME-INR
INR: 1 (ref 0.8–1.2)
Prothrombin Time: 13.1 seconds (ref 11.4–15.2)

## 2021-04-06 LAB — PROCALCITONIN: Procalcitonin: 1.15 ng/mL

## 2021-04-06 LAB — RESP PANEL BY RT-PCR (FLU A&B, COVID) ARPGX2
Influenza A by PCR: NEGATIVE
Influenza B by PCR: NEGATIVE
SARS Coronavirus 2 by RT PCR: NEGATIVE

## 2021-04-06 LAB — LACTIC ACID, PLASMA
Lactic Acid, Venous: 1.8 mmol/L (ref 0.5–1.9)
Lactic Acid, Venous: 1 mmol/L (ref 0.5–1.9)

## 2021-04-06 LAB — POTASSIUM: Potassium: 5 mmol/L (ref 3.5–5.1)

## 2021-04-06 MED ORDER — ACETAMINOPHEN 325 MG PO TABS
650.0000 mg | ORAL_TABLET | Freq: Once | ORAL | Status: AC
  Administered 2021-04-06: 650 mg via ORAL
  Filled 2021-04-06: qty 2

## 2021-04-06 MED ORDER — SIMVASTATIN 20 MG PO TABS
40.0000 mg | ORAL_TABLET | Freq: Every day | ORAL | Status: DC
  Administered 2021-04-07 – 2021-04-14 (×8): 40 mg via ORAL
  Filled 2021-04-06 (×6): qty 2
  Filled 2021-04-06: qty 4
  Filled 2021-04-06 (×3): qty 2

## 2021-04-06 MED ORDER — VANCOMYCIN HCL 1500 MG/300ML IV SOLN
1500.0000 mg | Freq: Once | INTRAVENOUS | Status: AC
  Administered 2021-04-06: 1500 mg via INTRAVENOUS
  Filled 2021-04-06: qty 300

## 2021-04-06 MED ORDER — MORPHINE SULFATE (PF) 2 MG/ML IV SOLN
2.0000 mg | INTRAVENOUS | Status: DC | PRN
  Administered 2021-04-07: 2 mg via INTRAVENOUS
  Filled 2021-04-06: qty 1

## 2021-04-06 MED ORDER — VANCOMYCIN HCL 1250 MG/250ML IV SOLN: 1250.0000 mg | INTRAVENOUS | Status: DC

## 2021-04-06 MED ORDER — OXYCODONE HCL 5 MG PO TABS
5.0000 mg | ORAL_TABLET | ORAL | Status: DC | PRN
  Administered 2021-04-07 – 2021-04-15 (×18): 5 mg via ORAL
  Filled 2021-04-06 (×20): qty 1

## 2021-04-06 MED ORDER — HEPARIN SODIUM (PORCINE) 5000 UNIT/ML IJ SOLN
5000.0000 [IU] | Freq: Three times a day (TID) | INTRAMUSCULAR | Status: DC
  Administered 2021-04-07 – 2021-04-08 (×5): 5000 [IU] via SUBCUTANEOUS
  Filled 2021-04-06 (×5): qty 1

## 2021-04-06 MED ORDER — ACETAMINOPHEN 650 MG RE SUPP: 650.0000 mg | Freq: Four times a day (QID) | RECTAL | Status: DC | PRN

## 2021-04-06 MED ORDER — PANTOPRAZOLE SODIUM 40 MG PO TBEC
40.0000 mg | DELAYED_RELEASE_TABLET | Freq: Every day | ORAL | Status: DC
  Administered 2021-04-07 – 2021-04-15 (×9): 40 mg via ORAL
  Filled 2021-04-06 (×9): qty 1

## 2021-04-06 MED ORDER — ONDANSETRON HCL 4 MG/2ML IJ SOLN
4.0000 mg | Freq: Four times a day (QID) | INTRAMUSCULAR | Status: DC | PRN
  Administered 2021-04-07: 4 mg via INTRAVENOUS
  Filled 2021-04-06: qty 2

## 2021-04-06 MED ORDER — METRONIDAZOLE 500 MG/100ML IV SOLN: 500.0000 mg | Freq: Two times a day (BID) | INTRAVENOUS | Status: DC

## 2021-04-06 MED ORDER — ACETAMINOPHEN 325 MG PO TABS
650.0000 mg | ORAL_TABLET | Freq: Four times a day (QID) | ORAL | Status: DC | PRN
  Administered 2021-04-07: 650 mg via ORAL
  Filled 2021-04-06: qty 2

## 2021-04-06 MED ORDER — ONDANSETRON HCL 4 MG PO TABS
4.0000 mg | ORAL_TABLET | Freq: Four times a day (QID) | ORAL | Status: DC | PRN
  Filled 2021-04-06: qty 1

## 2021-04-06 MED ORDER — VANCOMYCIN HCL IN DEXTROSE 1-5 GM/200ML-% IV SOLN: 1000.0000 mg | Freq: Once | INTRAVENOUS | Status: DC

## 2021-04-06 MED ORDER — LACTATED RINGERS IV BOLUS (SEPSIS)
1000.0000 mL | Freq: Once | INTRAVENOUS | Status: AC
  Administered 2021-04-06: 1000 mL via INTRAVENOUS

## 2021-04-06 MED ORDER — METRONIDAZOLE 500 MG/100ML IV SOLN
500.0000 mg | Freq: Once | INTRAVENOUS | Status: AC
  Administered 2021-04-06: 500 mg via INTRAVENOUS
  Filled 2021-04-06: qty 100

## 2021-04-06 MED ORDER — SODIUM CHLORIDE 0.9 % IV SOLN
2.0000 g | INTRAVENOUS | Status: DC
  Administered 2021-04-07: 2 g via INTRAVENOUS
  Filled 2021-04-06: qty 2

## 2021-04-06 MED ORDER — MELATONIN 3 MG PO TABS
6.0000 mg | ORAL_TABLET | Freq: Every day | ORAL | Status: DC
  Filled 2021-04-06: qty 2

## 2021-04-06 MED ORDER — SODIUM CHLORIDE 0.9 % IV SOLN
2.0000 g | Freq: Once | INTRAVENOUS | Status: AC
  Administered 2021-04-06: 2 g via INTRAVENOUS
  Filled 2021-04-06: qty 2

## 2021-04-06 MED ORDER — LOSARTAN POTASSIUM 25 MG PO TABS: 100.0000 mg | ORAL_TABLET | Freq: Every day | ORAL | Status: DC

## 2021-04-06 MED ORDER — LACTATED RINGERS IV SOLN: INTRAVENOUS | Status: DC

## 2021-04-06 NOTE — Progress Notes (Signed)
Pharmacy Antibiotic Note ? ?Diane Torres is a 81 y.o. female admitted on 04/06/2021 with  unknown source of infection .  Pharmacy has been consulted for Vancomycin and cefepime dosing. ? ?Plan: ?Vancomycin '1500mg'$  IV loading dose then '1250mg'$  mg IV Q 48 hrs. Goal AUC 400-550. ?Expected AUC: 476 ?SCr used: 1.67 ?Cefepime 2gm IV q24h ?F/U cxs and clinical progress ?Monitor V/S, labs and levels as indicated  ? ?Height: '5\' 1"'$  (154.9 cm) ?Weight: 70.3 kg (155 lb) ?IBW/kg (Calculated) : 47.8 ? ?Temp (24hrs), Avg:101.3 ?F (38.5 ?C), Min:99.4 ?F (37.4 ?C), Max:103.2 ?F (39.6 ?C) ? ?Recent Labs  ?Lab 04/06/21 ?1910  ?WBC 7.8  ?CREATININE 1.67*  ?LATICACIDVEN 1.8  ?  ?Estimated Creatinine Clearance: 23.7 mL/min (A) (by C-G formula based on SCr of 1.67 mg/dL (H)).   ? ?Allergies  ?Allergen Reactions  ? Bee Venom Anaphylaxis  ? ? ?Antimicrobials this admission: ?Vancomycin  3/4 >>  ?Cefepime 3/4 >>  ?Flagyl 3/4>> ? ?Microbiology results: ?3/4 BCx: pending ?3/4 UCx: pending  ? MRSA PCR:  ? ?Thank you for allowing pharmacy to be a part of this patient?s care. ? ?Isac Sarna, BS Pharm D, BCPS ?Clinical Pharmacist ?Pager [phone] ?04/06/2021 8:29 PM ? ?

## 2021-04-06 NOTE — ED Provider Notes (Signed)
[facility] Provider Note   CSN: [id number] Arrival date & time: 04/06/21  1832     History  Chief Complaint  Patient presents with   Fever    Diane Torres is a 81 y.o. female.   Fever Associated symptoms: nausea and vomiting   Patient presenting for fever starting this afternoon.  Per chart review, history includes hypothyroidism, CKD, diverticulosis, HLD, GERD, anemia, HTN, and pituitary mass.  She reports he was in her normal state of health earlier today.  This afternoon, she developed fever at home.  She has not taken any antipyresis.  Following fever onset, she did have an episode of nausea and vomiting.  She has had some mild abdominal pain, greater on the left side.  She denies any other recent symptoms.  She does have a history of facets of diverticulitis.    Home Medications Prior to Admission medications   Medication Sig Start Date End Date Taking? Authorizing Provider  acetaminophen (TYLENOL) 500 MG tablet Take 1 tablet (500 mg total) by mouth every 6 (six) hours as needed. 10/29/20   Raiford Noble Latif, DO  cetirizine (ZYRTEC) 10 MG tablet Take 10 mg by mouth daily. 08/04/19   [provider]  losartan (COZAAR) 100 MG tablet Take 100 mg by mouth daily.    [provider]  Melatonin 10 MG TABS Take by mouth.    [provider]  pantoprazole (PROTONIX) 40 MG tablet TAKE ONE TABLET BY MOUTH ONCE DAILY. Patient taking differently: Take 40 mg by mouth daily. 08/07/16   Mahala Menghini, PA-C  simvastatin (ZOCOR) 40 MG tablet Take 40 mg by mouth at bedtime.     [provider]      Allergies    Bee venom    Review of Systems   Review of Systems  Constitutional:  Positive for fever.  Gastrointestinal:  Positive for abdominal pain, nausea and vomiting.  All other systems reviewed and are negative.  Physical Exam Updated Vital Signs BP (!) 107/42    Pulse 87    Temp 99.6 F (37.6 C) (Oral)    Resp 20    Ht '5\' 1"'$   (1.549 m)    Wt 70.3 kg    SpO2 98%    BMI 29.29 kg/m  Physical Exam Vitals and nursing note reviewed.  Constitutional:      General: She is not in acute distress.    Appearance: Normal appearance. She is well-developed. She is not ill-appearing, toxic-appearing or diaphoretic.  HENT:     Head: Normocephalic and atraumatic.     Right Ear: External ear normal.     Nose: Nose normal.     Mouth/Throat:     Mouth: Mucous membranes are moist.     Pharynx: Oropharynx is clear.  Eyes:     Extraocular Movements: Extraocular movements intact.     Conjunctiva/sclera: Conjunctivae normal.  Cardiovascular:     Rate and Rhythm: Regular rhythm. Tachycardia present.     Heart sounds: No murmur heard. Pulmonary:     Effort: Pulmonary effort is normal. No respiratory distress.     Breath sounds: Normal breath sounds.  Chest:     Chest wall: No tenderness.  Abdominal:     Palpations: Abdomen is soft.     Tenderness: There is abdominal tenderness (Mild, left-sided). There is no right CVA tenderness, left CVA tenderness, guarding or rebound.  Musculoskeletal:        General: No swelling. Normal range of motion.  Cervical back: Normal range of motion and neck supple.  Skin:    General: Skin is warm and dry.     Capillary Refill: Capillary refill takes less than 2 seconds.     Coloration: Skin is not jaundiced or pale.  Neurological:     General: No focal deficit present.     Mental Status: She is alert and oriented to person, place, and time.     Cranial Nerves: No cranial nerve deficit.     Sensory: No sensory deficit.     Motor: No weakness.  Psychiatric:        Mood and Affect: Mood normal.        Behavior: Behavior normal.        Thought Content: Thought content normal.        Judgment: Judgment normal.    ED Results / Procedures / Treatments   Labs (all labs ordered are listed, but only abnormal results are displayed) Labs Reviewed  COMPREHENSIVE METABOLIC PANEL - Abnormal;  Notable for the following components:      Result Value   Sodium 131 (*)    Potassium 5.7 (*)    CO2 20 (*)    BUN 32 (*)    Creatinine, Ser 1.67 (*)    GFR, Estimated 31 (*)    All other components within normal limits  CBC WITH DIFFERENTIAL/PLATELET - Abnormal; Notable for the following components:   Hemoglobin 11.8 (*)    All other components within normal limits  COMPREHENSIVE METABOLIC PANEL - Abnormal; Notable for the following components:   Sodium 132 (*)    CO2 20 (*)    Glucose, Bld 102 (*)    BUN 30 (*)    Creatinine, Ser 1.50 (*)    Calcium 8.4 (*)    Total Protein 5.3 (*)    Albumin 2.6 (*)    AST 14 (*)    GFR, Estimated 35 (*)    All other components within normal limits  MAGNESIUM - Abnormal; Notable for the following components:   Magnesium 1.5 (*)    All other components within normal limits  CBC WITH DIFFERENTIAL/PLATELET - Abnormal; Notable for the following components:   RBC 2.82 (*)    Hemoglobin 8.8 (*)    HCT 27.5 (*)    nRBC 0.3 (*)    Monocytes Absolute 1.1 (*)    All other components within normal limits  RESP PANEL BY RT-PCR (FLU A&B, COVID) ARPGX2  CULTURE, BLOOD (ROUTINE X 2)  CULTURE, BLOOD (ROUTINE X 2)  URINE CULTURE  LACTIC ACID, PLASMA  LACTIC ACID, PLASMA  PROTIME-INR  POTASSIUM  PROCALCITONIN  URINALYSIS, ROUTINE W REFLEX MICROSCOPIC  URINALYSIS, COMPLETE (UACMP) WITH MICROSCOPIC    EKG EKG Interpretation  Date/Time:  Saturday April 06 2021 18:43:49 EST Ventricular Rate:  110 PR Interval:  168 QRS Duration: 87 QT Interval:  322 QTC Calculation: 436 R Axis:   -13 Text Interpretation: Sinus tachycardia Low voltage, precordial leads Abnormal R-wave progression, late transition Confirmed by Godfrey Pick [phone]) on 04/06/2021 8:16:16 PM  Radiology CT ABDOMEN PELVIS WO CONTRAST  Result Date: 04/06/2021 CLINICAL DATA:  Fever with nausea and vomiting. EXAM: CT ABDOMEN AND PELVIS WITHOUT CONTRAST TECHNIQUE: Multidetector CT imaging of  the abdomen and pelvis was performed following the standard protocol without IV contrast. RADIATION DOSE REDUCTION: This exam was performed according to the departmental dose-optimization program which includes automated exposure control, adjustment of the mA and/or kV according to patient size and/or use of iterative  reconstruction technique. COMPARISON:  December 17, 2020 FINDINGS: Lower chest: Mild atelectasis is seen within the bilateral lung bases. Hepatobiliary: Punctate calcified granulomas are noted within the right lobe of the liver. No gallstones, gallbladder wall thickening, or biliary dilatation. Pancreas: Unremarkable. No pancreatic ductal dilatation or surrounding inflammatory changes. Spleen: Normal in size without focal abnormality. Adrenals/Urinary Tract: Adrenal glands are unremarkable. Kidneys are normal in size, without renal calculi or hydronephrosis. An 11 mm x 7 mm mildly hyperdense focus (approximately 47.44 Hounsfield units) is seen within the mid right kidney (axial CT image 39, CT series 2). Bladder is unremarkable. Stomach/Bowel: Stomach is within normal limits. Appendix appears normal. No evidence of bowel dilatation. Numerous diverticula are seen throughout the large bowel. Mild pericolonic inflammatory fat stranding is seen along the posterior aspect of the ascending colon. Vascular/Lymphatic: Aortic atherosclerosis. No enlarged abdominal or pelvic lymph nodes. Reproductive: Status post hysterectomy. No adnexal masses. Other: No abdominal wall hernia or abnormality. No abdominopelvic ascites. Musculoskeletal: Degenerative changes seen throughout the lumbar spine. IMPRESSION: 1. Findings consistent with mild acute diverticulitis involving the ascending colon. 2. Findings which may represent a hemorrhagic right renal cyst. Correlation with nonemergent renal ultrasound is recommended. 3. Mild bibasilar atelectasis. 4. Aortic atherosclerosis Aortic Atherosclerosis (ICD10-I70.0).  Electronically Signed   By: Virgina Norfolk M.D.   On: 04/06/2021 22:10   US RENAL  Result Date: 04/07/2021 CLINICAL DATA:  AK I EXAM: RENAL / URINARY TRACT ULTRASOUND COMPLETE COMPARISON:  April 06, 2021 FINDINGS: Right Kidney: Renal measurements: 9.5 x 3.9 x 4.6 cm = volume: 89 mL. Echogenicity within normal limits. No mass or hydronephrosis visualized. Left Kidney: Renal measurements: 9.0 x 4.4 x 4.7 cm = volume: 97 mL. Echogenicity within normal limits. No mass or hydronephrosis visualized. Bladder: Appears normal for degree of bladder distention. Other: None. IMPRESSION: 1. No hydronephrosis. 2. Mildly hyperdense focus noted on recent CT is not definitively visualized sonographically. Consider follow-up renal cyst protocol CT in 6 months. Electronically Signed   By: Valentino Saxon M.D.   On: 04/07/2021 12:16   DG Chest Portable 1 View  Result Date: 04/07/2021 CLINICAL DATA:  81 year old female with history of shortness of breath. Possible aspiration. EXAM: PORTABLE CHEST 1 VIEW COMPARISON:  Chest x-ray 04/06/2021. FINDINGS: Marked elevation of the right hemidiaphragm. Bibasilar opacities, likely subsegmental atelectasis. No pneumothorax. No evidence of pulmonary edema. Heart size is normal. Upper mediastinal contours are within normal limits. IMPRESSION: 1. Low lung volumes with bibasilar subsegmental atelectasis and chronic elevation of the right hemidiaphragm. Electronically Signed   By: Vinnie Langton M.D.   On: 04/07/2021 06:00   DG Chest Port 1 View  Result Date: 04/06/2021 CLINICAL DATA:  Questionable sepsis - evaluate for abnormality EXAM: PORTABLE CHEST 1 VIEW COMPARISON:  March 20, 2009 October 20, 2019 FINDINGS: The cardiomediastinal silhouette is unchanged in contour.Suboptimal visualization of the LEFT hemidiaphragm is favored to be due to technical related factors. No pleural effusion. No pneumothorax. No acute pleuroparenchymal abnormality. Visualized abdomen is unremarkable.  IMPRESSION: No acute cardiopulmonary abnormality. Electronically Signed   By: Valentino Saxon M.D.   On: 04/06/2021 19:37    Procedures Procedures    Medications Ordered in ED Medications  ceFEPIme (MAXIPIME) 2 g in sodium chloride 0.9 % 100 mL IVPB (has no administration in time range)  simvastatin (ZOCOR) tablet 40 mg (40 mg Oral Not Given 04/07/21 0442)  pantoprazole (PROTONIX) EC tablet 40 mg (40 mg Oral Given 04/07/21 0927)  melatonin tablet 6 mg (6 mg Oral Not  Given 04/07/21 0443)  heparin injection 5,000 Units (5,000 Units Subcutaneous Given 04/07/21 1356)  acetaminophen (TYLENOL) tablet 650 mg (650 mg Oral Given 04/07/21 0108)    Or  acetaminophen (TYLENOL) suppository 650 mg ( Rectal See Alternative 04/07/21 0108)  oxyCODONE (Oxy IR/ROXICODONE) immediate release tablet 5 mg (5 mg Oral Given 04/07/21 0110)  ondansetron (ZOFRAN) tablet 4 mg ( Oral See Alternative 04/07/21 0042)    Or  ondansetron (ZOFRAN) injection 4 mg (4 mg Intravenous Given 04/07/21 0042)  morphine (PF) 2 MG/ML injection 2 mg (2 mg Intravenous Given 04/07/21 0053)  metroNIDAZOLE (FLAGYL) IVPB 500 mg (500 mg Intravenous New Bag/Given 04/07/21 0900)  albuterol (PROVENTIL) (2.5 MG/3ML) 0.083% nebulizer solution 2.5 mg (has no administration in time range)  midodrine (PROAMATINE) tablet 10 mg (10 mg Oral Given 04/07/21 1230)  lactated ringers infusion ( Intravenous New Bag/Given 04/07/21 1236)  magnesium sulfate IVPB 4 g 100 mL (4 g Intravenous New Bag/Given 04/07/21 1251)  lactated ringers bolus 1,000 mL (0 mLs Intravenous Stopped 04/06/21 2209)  acetaminophen (TYLENOL) tablet 650 mg (650 mg Oral Given 04/06/21 1942)  ceFEPIme (MAXIPIME) 2 g in sodium chloride 0.9 % 100 mL IVPB (0 g Intravenous Stopped 04/06/21 2208)  metroNIDAZOLE (FLAGYL) IVPB 500 mg (0 mg Intravenous Stopped 04/06/21 2206)  vancomycin (VANCOREADY) IVPB 1500 mg/300 mL (0 mg Intravenous Stopped 04/07/21 0502)  lactated ringers bolus 500 mL (0 mLs Intravenous Stopped 04/07/21 0502)   lactated ringers bolus 500 mL (0 mLs Intravenous Stopped 04/07/21 0700)    ED Course/ Medical Decision Making/ A&P                           Medical Decision Making Amount and/or Complexity of Data Reviewed Labs: ordered. Radiology: ordered. ECG/medicine tests: ordered.  Risk OTC drugs. Prescription drug management. Decision regarding hospitalization.   This patient presents to the ED for concern of fever, this involves an extensive number of treatment options, and is a complaint that carries with it a high risk of complications and morbidity.  The differential diagnosis includes sepsis, viral infection, medication effect   Co morbidities that complicate the patient evaluation  hypothyroidism, CKD, diverticulosis, HLD, GERD, anemia, HTN, and pituitary mass   Additional history obtained:  Additional history obtained from patient's daughter External records from outside source obtained and reviewed including EMR   Lab Tests:  I Ordered, and personally interpreted labs.  The pertinent results include: AKI, no leukocytosis, baseline normocytic anemia, COVID and flu negative   Imaging Studies ordered:  I ordered imaging studies including chest x-ray, CT of abdomen and pelvis I independently visualized and interpreted imaging which showed chest x-ray was unremarkable.  CT of abdomen/pelvis showed acute uncomplicated diverticulitis I agree with the radiologist interpretation   Cardiac Monitoring:  The patient was maintained on a cardiac monitor.  I personally viewed and interpreted the cardiac monitored which showed an underlying rhythm of: Sinus rhythm   Medicines ordered and prescription drug management:  I ordered medication including IV fluids and broad-spectrum antibiotics for concern of sepsis; Tylenol for antipyresis Reevaluation of the patient after these medicines showed that the patient improved I have reviewed the patients home medicines and have made  adjustments as needed   Critical Interventions:  Initiation of IV fluids and broad-spectrum antibiotics for concern of sepsis   Problem List / ED Course:  Other functional 81 year old female presenting for onset of fevers today.  On arrival, rectal temperature was 103 degrees.  Vital signs were also notable for tachycardia.  At this time, she is normotensive.  Diagnostic work-up is initiated.  Patient was given bolus of IV fluids.  She was given Tylenol for her fever.  Initial lab work showed no leukocytosis.  Antibiotics were held pending results of COVID and flu testing.  COVID and flu were negative.  Patient was initiated on broad-spectrum antibiotics.  Given her abdominal pain and tenderness, a CT scan of abdomen pelvis was ordered.  Lab work showed AKI and so contrasted study was deferred.  Noncontrasted study did show evidence of acute diverticulitis.  Patient was informed of results.  Given her age and comorbidities, patient to be admitted to hospitalist for further management.    Social Determinants of Health:  Able to live independently.           Final Clinical Impression(s) / ED Diagnoses Final diagnoses:  AKI (acute kidney injury) ([place])  Diverticulitis    Rx / [place] Orders ED Discharge Orders     None         Godfrey Pick, MD 04/07/21 1442

## 2021-04-06 NOTE — ED Triage Notes (Signed)
Patient brought in via EMS from home. Alert and oriented. Airway patent. Patient com nausea, vomiting, and fever of 101.5 that started today at 1630. Patient states she has recently been exposed to Covid. Denies any shortness of breath. Per paramedic patients o2 sat 94% on room air.  ?

## 2021-04-06 NOTE — Progress Notes (Signed)
[facility] following for Sepsis Protocol, Secure Chat Communication took place with current ED Provider, Awaiting Resp Panel before Abx's ordered ?

## 2021-04-07 ENCOUNTER — Inpatient Hospital Stay (HOSPITAL_COMMUNITY): Payer: Medicare Other

## 2021-04-07 ENCOUNTER — Encounter (HOSPITAL_COMMUNITY): Payer: Self-pay | Admitting: Family Medicine

## 2021-04-07 DIAGNOSIS — I10 Essential (primary) hypertension: Secondary | ICD-10-CM | POA: Diagnosis not present

## 2021-04-07 DIAGNOSIS — A419 Sepsis, unspecified organism: Secondary | ICD-10-CM

## 2021-04-07 DIAGNOSIS — N179 Acute kidney failure, unspecified: Secondary | ICD-10-CM | POA: Diagnosis not present

## 2021-04-07 DIAGNOSIS — K219 Gastro-esophageal reflux disease without esophagitis: Secondary | ICD-10-CM

## 2021-04-07 DIAGNOSIS — K5792 Diverticulitis of intestine, part unspecified, without perforation or abscess without bleeding: Secondary | ICD-10-CM

## 2021-04-07 DIAGNOSIS — E782 Mixed hyperlipidemia: Secondary | ICD-10-CM

## 2021-04-07 DIAGNOSIS — J9601 Acute respiratory failure with hypoxia: Secondary | ICD-10-CM | POA: Diagnosis present

## 2021-04-07 DIAGNOSIS — E875 Hyperkalemia: Secondary | ICD-10-CM

## 2021-04-07 DIAGNOSIS — R6520 Severe sepsis without septic shock: Secondary | ICD-10-CM

## 2021-04-07 LAB — COMPREHENSIVE METABOLIC PANEL
ALT: 10 U/L (ref 0–44)
AST: 14 U/L — ABNORMAL LOW (ref 15–41)
Albumin: 2.6 g/dL — ABNORMAL LOW (ref 3.5–5.0)
Alkaline Phosphatase: 40 U/L (ref 38–126)
Anion gap: 7 (ref 5–15)
BUN: 30 mg/dL — ABNORMAL HIGH (ref 8–23)
CO2: 20 mmol/L — ABNORMAL LOW (ref 22–32)
Calcium: 8.4 mg/dL — ABNORMAL LOW (ref 8.9–10.3)
Chloride: 105 mmol/L (ref 98–111)
Creatinine, Ser: 1.5 mg/dL — ABNORMAL HIGH (ref 0.44–1.00)
GFR, Estimated: 35 mL/min — ABNORMAL LOW (ref >60)
Glucose, Bld: 102 mg/dL — ABNORMAL HIGH (ref 70–99)
Potassium: 4.3 mmol/L (ref 3.5–5.1)
Sodium: 132 mmol/L — ABNORMAL LOW (ref 135–145)
Total Bilirubin: 0.6 mg/dL (ref 0.3–1.2)
Total Protein: 5.3 g/dL — ABNORMAL LOW (ref 6.5–8.1)

## 2021-04-07 LAB — BASIC METABOLIC PANEL
Anion gap: 8 (ref 5–15)
BUN: 26 mg/dL — ABNORMAL HIGH (ref 8–23)
CO2: 19 mmol/L — ABNORMAL LOW (ref 22–32)
Calcium: 8.2 mg/dL — ABNORMAL LOW (ref 8.9–10.3)
Chloride: 100 mmol/L (ref 98–111)
Creatinine, Ser: 1.27 mg/dL — ABNORMAL HIGH (ref 0.44–1.00)
GFR, Estimated: 42 mL/min — ABNORMAL LOW (ref >60)
Glucose, Bld: 96 mg/dL (ref 70–99)
Potassium: 4.6 mmol/L (ref 3.5–5.1)
Sodium: 127 mmol/L — ABNORMAL LOW (ref 135–145)

## 2021-04-07 LAB — CBC WITH DIFFERENTIAL/PLATELET
Band Neutrophils: 23 %
Basophils Absolute: 0 10*3/uL (ref 0.0–0.1)
Basophils Relative: 0 %
Eosinophils Absolute: 0 10*3/uL (ref 0.0–0.5)
Eosinophils Relative: 0 %
HCT: 27.5 % — ABNORMAL LOW (ref 36.0–46.0)
Hemoglobin: 8.8 g/dL — ABNORMAL LOW (ref 12.0–15.0)
Lymphocytes Relative: 24 %
Lymphs Abs: 1.7 10*3/uL (ref 0.7–4.0)
MCH: 31.2 pg (ref 26.0–34.0)
MCHC: 32 g/dL (ref 30.0–36.0)
MCV: 97.5 fL (ref 80.0–100.0)
Metamyelocytes Relative: 11 %
Monocytes Absolute: 1.1 10*3/uL — ABNORMAL HIGH (ref 0.1–1.0)
Monocytes Relative: 15 %
Neutro Abs: 3.6 10*3/uL (ref 1.7–7.7)
Neutrophils Relative %: 27 %
Platelets: 259 10*3/uL (ref 150–400)
RBC: 2.82 MIL/uL — ABNORMAL LOW (ref 3.87–5.11)
RDW: 14.9 % (ref 11.5–15.5)
WBC Morphology: INCREASED
WBC: 7.2 10*3/uL (ref 4.0–10.5)
nRBC: 0.3 % — ABNORMAL HIGH (ref 0.0–0.2)

## 2021-04-07 LAB — URINALYSIS, ROUTINE W REFLEX MICROSCOPIC
Bilirubin Urine: NEGATIVE
Glucose, UA: NEGATIVE mg/dL
Ketones, ur: NEGATIVE mg/dL
Nitrite: NEGATIVE
Protein, ur: NEGATIVE mg/dL
Specific Gravity, Urine: 1.015 (ref 1.005–1.030)
pH: 5 (ref 5.0–8.0)

## 2021-04-07 LAB — CBC
HCT: 26.9 % — ABNORMAL LOW (ref 36.0–46.0)
Hemoglobin: 8.8 g/dL — ABNORMAL LOW (ref 12.0–15.0)
MCH: 31.2 pg (ref 26.0–34.0)
MCHC: 32.7 g/dL (ref 30.0–36.0)
MCV: 95.4 fL (ref 80.0–100.0)
Platelets: 300 10*3/uL (ref 150–400)
RBC: 2.82 MIL/uL — ABNORMAL LOW (ref 3.87–5.11)
RDW: 14.8 % (ref 11.5–15.5)
WBC: 9.2 10*3/uL (ref 4.0–10.5)
nRBC: 0 % (ref 0.0–0.2)

## 2021-04-07 LAB — MAGNESIUM: Magnesium: 1.5 mg/dL — ABNORMAL LOW (ref 1.7–2.4)

## 2021-04-07 MED ORDER — MAGNESIUM SULFATE 4 GM/100ML IV SOLN
4.0000 g | Freq: Once | INTRAVENOUS | Status: AC
  Administered 2021-04-07: 4 g via INTRAVENOUS
  Filled 2021-04-07: qty 100

## 2021-04-07 MED ORDER — ALBUTEROL SULFATE (2.5 MG/3ML) 0.083% IN NEBU: 2.5000 mg | INHALATION_SOLUTION | RESPIRATORY_TRACT | Status: DC | PRN

## 2021-04-07 MED ORDER — METRONIDAZOLE 500 MG/100ML IV SOLN
500.0000 mg | Freq: Two times a day (BID) | INTRAVENOUS | Status: AC
  Administered 2021-04-07 – 2021-04-13 (×13): 500 mg via INTRAVENOUS
  Filled 2021-04-07 (×13): qty 100

## 2021-04-07 MED ORDER — SODIUM CHLORIDE 0.9 % IV SOLN: INTRAVENOUS | Status: DC

## 2021-04-07 MED ORDER — MIDODRINE HCL 5 MG PO TABS: 10.0000 mg | ORAL_TABLET | Freq: Three times a day (TID) | ORAL | Status: DC

## 2021-04-07 MED ORDER — LACTATED RINGERS IV BOLUS
500.0000 mL | Freq: Once | INTRAVENOUS | Status: AC
  Administered 2021-04-07: 500 mL via INTRAVENOUS

## 2021-04-07 MED ORDER — LACTATED RINGERS IV SOLN: INTRAVENOUS | Status: DC

## 2021-04-07 MED ORDER — MIDODRINE HCL 5 MG PO TABS
10.0000 mg | ORAL_TABLET | Freq: Three times a day (TID) | ORAL | Status: DC
Start: 2021-04-07 — End: 2021-04-08
  Administered 2021-04-07 – 2021-04-08 (×7): 10 mg via ORAL
  Filled 2021-04-07 (×7): qty 2

## 2021-04-07 NOTE — Assessment & Plan Note (Signed)
Continue statin. 

## 2021-04-07 NOTE — Assessment & Plan Note (Addendum)
resolved 

## 2021-04-07 NOTE — Assessment & Plan Note (Addendum)
On admission: HR 106, R 21, Temp 103.2, AKI, acute respiratory failure with hypoxia ?Diverticulitis ?Continuing abx as noted above ?Sepsis physiology resolved ?

## 2021-04-07 NOTE — Assessment & Plan Note (Signed)
Continue Protonix °

## 2021-04-07 NOTE — Assessment & Plan Note (Addendum)
CT with mild diverticulitis involving the ascending colon ?Currently on IV antibiotics (3/5-3/13), will plan for 10 days (d/c with augmentin) ?Seems improved ?

## 2021-04-07 NOTE — Assessment & Plan Note (Addendum)
Renal function improved at this time ?

## 2021-04-07 NOTE — Assessment & Plan Note (Addendum)
CT PT protocol with small bilateral pleural effusions with overlying atelectasis and patchy lower lobe infiltrates, concern for aspiration ?On appropriate abx ?Wean o2 as able - currently on 2 L at discharge, will discharge on 2 L with plan to wean as tolerated for goal >92%. ?SLP eval -> mild aspiration risk, regular/think liquid ?

## 2021-04-07 NOTE — H&P (Signed)
History and Physical    Patient: Diane Torres XBJ:[id number] DOB: [birth year] DOA: 04/06/2021 DOS: the patient was seen and examined on 04/07/2021 PCP: Benita Stabile, MD  Patient coming from: Home  Chief Complaint:  Chief Complaint  Patient presents with   Fever    HPI: Diane Torres is a 81 y.o. female with medical history significant of with history significant for GERD, hypertension, mixed hyperlipidemia, diverticulitis, presents ED with a chief complaint of fever.  Patient reports that she has had a decreased appetite and decreased p.o. intake for several days.  Her daughter reports that she was in her normal good spirits this morning when they were running errands.  She laid down for a nap and when she woke up she had chills, fever, vomiting.  Daughter confirms that patient had not eaten the whole day.  They report several episodes of vomiting yellow liquid.  No blood.  Patient denies any associated belly pain.  She does not remember when her last normal bowel movement was, but reports that she has had diarrhea for 2 nights in a row.  She does not know when her last normal meal was either, but thinks its been several days.  She has noticed a darkening color to her urine, but no blood.  No dysuria.  Patient has no further complaints at this time.  Patient does not smoke, does not drink, does not use illicit drugs.  She is vaccinated for COVID.  Patient is full code.  During admission exam patient started vomiting, immediately after vomiting she coughed for several minutes, and then her O2 sat started to drop.  Possible aspiration pneumonitis.  We will repeat chest x-ray at 6 AM-as we would not expect to see changes as quickly.  Patient also had a return of her fever up to 103.3.  Blood cultures already pending.  Tachycardia at admission was likely secondary to fever.   Review of Systems: As mentioned in the history of present illness. All other systems reviewed and are negative. Past Medical  History:  Diagnosis Date   CAP (community acquired pneumonia) 05/2018   Diverticulitis    CT verified in 2015, 2018, 2019, 2020   GERD (gastroesophageal reflux disease)    Hypothyroidism    Impaired fasting glucose    Mixed hyperlipidemia    Past Surgical History:  Procedure Laterality Date   COLONOSCOPY     COLONOSCOPY N/A 12/12/2015   Dr. Jena Gauss: one 3 mm tubular adenoma in rectum s/p removal. Diverticulosis in entire colon. Internal hemorrhoids.    ESOPHAGOGASTRODUODENOSCOPY N/A 03/19/2016   Dr. Jena Gauss: LA Grade A esophagitis, empiric dilation, normal stomach, normal duodenum   Left lobe thyroidectomy     MALONEY DILATION N/A 03/19/2016   Procedure: [name] DILATION;  Surgeon: Corbin Ade, MD;  Location: [facility];  Service: Endoscopy;  Laterality: N/A;   POLYPECTOMY  12/12/2015   Procedure: POLYPECTOMY;  Surgeon: Corbin Ade, MD;  Location: [facility];  Service: Endoscopy;;  colon   RECTOCELE REPAIR     TOTAL ABDOMINAL HYSTERECTOMY W/ BILATERAL SALPINGOOPHORECTOMY     Social History:  reports that she has never smoked. She has never used smokeless tobacco. She reports that she does not drink alcohol and does not use drugs.  Allergies  Allergen Reactions   Bee Venom Anaphylaxis    Family History  Problem Relation Age of Onset   CAD Brother        2 brothers with CAD in their 98s  Crohn's disease Brother        had colon resection   Pancreatic cancer Brother    CAD Father    Heart failure Mother    Diabetes Mellitus II Mother    Colon cancer Neg Hx     Prior to Admission medications   Medication Sig Start Date End Date Taking? Authorizing Provider  acetaminophen (TYLENOL) 500 MG tablet Take 1 tablet (500 mg total) by mouth every 6 (six) hours as needed. 10/29/20   Marguerita Merles Latif, DO  cetirizine (ZYRTEC) 10 MG tablet Take 10 mg by mouth daily. 08/04/19   [provider]  losartan (COZAAR) 100 MG tablet Take 100 mg by mouth daily.    [provider]  Melatonin 10 MG TABS Take by mouth.    [provider]  pantoprazole (PROTONIX) 40 MG tablet TAKE ONE TABLET BY MOUTH ONCE DAILY. Patient taking differently: Take 40 mg by mouth daily. 08/07/16   Tiffany Kocher, PA-C  simvastatin (ZOCOR) 40 MG tablet Take 40 mg by mouth at bedtime.     [provider]    Physical Exam: Vitals:   04/06/21 2000 04/06/21 2115 04/06/21 2200 04/06/21 2230  BP: 134/63 (!) 137/49 (!) 132/50 (!) 140/91  Pulse: (!) 101 98 (!) 102 99  Resp: 18 (!) 21 20 17   Temp:  99.6 F (37.6 C)    TempSrc:  Oral    SpO2: 93% 92% 95% 95%  Weight:      Height:       1.  General: Patient lying supine in bed, head of bed elevated, ill-appearing   2. Psychiatric: Alert and oriented x 3, mood and behavior normal for situation, pleasant and cooperative with exam   3. Neurologic: Speech and language are normal, face is symmetric, moves all 4 extremities voluntarily, at baseline without acute deficits on limited exam   4. HEENMT:  Head is atraumatic, normocephalic, pupils reactive to light, neck is supple, trachea is midline, mucous membranes are mildly dry   5. Respiratory : Lungs are clear to auscultation bilaterally without wheezing, rhonchi, rales, no cyanosis, no increase in work of breathing or accessory muscle use   6. Cardiovascular : Heart rate tachycardic, rhythm is regular, no murmurs, rubs or gallops, peripheral edema present, peripheral pulses palpated   7. Gastrointestinal:  Abdomen is soft, nondistended, nontender to palpation bowel sounds active, no masses or organomegaly palpated   8. Skin:  Skin is warm, dry and intact without rashes, acute lesions, or ulcers on limited exam, some bruising left lower extremity   9.Musculoskeletal:  No acute deformities or trauma, no asymmetry in tone, peripheral edema present, peripheral pulses palpated, no tenderness to palpation in the extremities   Data Reviewed: In the ED Temp  99.4-103.2, heart rate 93-140, respiratory rate 17-21, blood pressure 116/95-145/56, satting at 92 to 95% It is reported that the heart rate of 140 only happens during active vomiting No leukocytosis White blood cell count of 7.8, hemoglobin 11.8, platelets 296 Slightly elevated potassium at 5.7, BUN 32, creatinine 1.67 Lactic acid 1.8-1.0 Negative COVID Blood cultures pending Chest x-ray shows no acute cardiopulmonary abnormality CT abdomen shows acute diverticulitis and recommendation for renal ultrasound for possible right hemorrhagic renal cyst Vanc cefepime Flagyl started in ED Admission requested for management of acute diverticulitis and 81 year old female   Assessment and Plan: * AKI (acute kidney injury) (HCC) Cr 1.06 to 1.67 Secondary to poor p.o. intake and sepsis 1 L bolus in ED Continue  195mL/h IV fluid Hold nephrotoxic agents when possible Continue to monitor  Hyperkalemia 5.7, likely secondary to AKI Fluids given in the ED, repeat potassium at admission and treat as indicated  Acute respiratory failure with hypoxia (HCC) Hypoxia and cough after several episodes of vomiting Possible aspiration In the aspiration would already be covered by antibiotics on board for diverticulitis Repeat chest x-ray 6 AM Wean off O2 as tolerated, right now requiring 4 L Breathing treatments as needed Continue to monitor  Sepsis (HCC) HR 106, R 21, Temp 103.2, AKI, acute respiratory failure with hypoxia Diverticulitis Started on vancomycin, cefepime, flagyl in ED Continue cefepime and flagyl 1L bolus in ED Continue LR 172ml/hour Sepsis order set utilized   Essential hypertension Continue losartan Blood pressure at admission is 145/56  Diverticulitis Continue cefepime and flagyl CT evidence of diverticulitis  See plan above  GERD (gastroesophageal reflux disease) Continue Protonix  Mixed hyperlipidemia Continue statin       Advance Care Planning:   Code  Status: Full Code   Consults: None  Family Communication: Daughter at bedside  Severity of Illness: The appropriate patient status for this patient is INPATIENT. Inpatient status is judged to be reasonable and necessary in order to provide the required intensity of service to ensure the patient's safety. The patient's presenting symptoms, physical exam findings, and initial radiographic and laboratory data in the context of their chronic comorbidities is felt to place them at high risk for further clinical deterioration. Furthermore, it is not anticipated that the patient will be medically stable for discharge from the hospital within 2 midnights of admission.   * I certify that at the point of admission it is my clinical judgment that the patient will require inpatient hospital care spanning beyond 2 midnights from the point of admission due to high intensity of service, high risk for further deterioration and high frequency of surveillance required.*  Author: Lilyan Gilford, DO 04/07/2021 2:07 AM  For on call review [email or web address].

## 2021-04-07 NOTE — Assessment & Plan Note (Addendum)
Will resume losartan at discharge ?

## 2021-04-07 NOTE — ED Notes (Signed)
Patient had 1 BM. Pericare provided, new brief/chuck placed, and new purwick/canister.  ?

## 2021-04-07 NOTE — Progress Notes (Signed)
Patient admitted to the hospital earlier this morning by Dr. Clearence Ped ? ?Patient seen and examined. Her main complaint at this point is fatigue. She does admit to nausea and vomiting and diarrhea. She denies any abdominal pain at present. She is very thirsty. She says she feels short of breath. Unclear if she has a cough. ? ?She was admitted to the hospital with acute diverticulitis and started on IV antibiotics. She is noted to be dehydrated with AKI and started on IV fluids. ARB is currently been held. No hydronephrosis on CT imaging. She did have vomiting in ED and subsequent hypoxia. Follow up chest xray is unrevealing. Will continue to follow for developing pneumonia, although this may be just a pneumonitis. Antibiotics for diverticulitis should also cover her lungs. Start on clear liquids for now. Continue fluids, antibiotics. Advance diet as tolerated. Physical therapy evaluation. ? ?Diane Torres ? ?

## 2021-04-08 ENCOUNTER — Encounter (HOSPITAL_COMMUNITY): Payer: Medicare Other | Admitting: Physical Therapy

## 2021-04-08 DIAGNOSIS — N1831 Chronic kidney disease, stage 3a: Secondary | ICD-10-CM

## 2021-04-08 DIAGNOSIS — J9601 Acute respiratory failure with hypoxia: Secondary | ICD-10-CM | POA: Diagnosis not present

## 2021-04-08 DIAGNOSIS — K5792 Diverticulitis of intestine, part unspecified, without perforation or abscess without bleeding: Secondary | ICD-10-CM | POA: Diagnosis not present

## 2021-04-08 DIAGNOSIS — N179 Acute kidney failure, unspecified: Secondary | ICD-10-CM | POA: Diagnosis not present

## 2021-04-08 DIAGNOSIS — R531 Weakness: Secondary | ICD-10-CM

## 2021-04-08 DIAGNOSIS — D649 Anemia, unspecified: Secondary | ICD-10-CM | POA: Diagnosis present

## 2021-04-08 LAB — COMPREHENSIVE METABOLIC PANEL
ALT: 10 U/L (ref 0–44)
AST: 17 U/L (ref 15–41)
Albumin: 2.3 g/dL — ABNORMAL LOW (ref 3.5–5.0)
Alkaline Phosphatase: 37 U/L — ABNORMAL LOW (ref 38–126)
Anion gap: 6 (ref 5–15)
BUN: 25 mg/dL — ABNORMAL HIGH (ref 8–23)
CO2: 21 mmol/L — ABNORMAL LOW (ref 22–32)
Calcium: 8.1 mg/dL — ABNORMAL LOW (ref 8.9–10.3)
Chloride: 103 mmol/L (ref 98–111)
Creatinine, Ser: 1.27 mg/dL — ABNORMAL HIGH (ref 0.44–1.00)
GFR, Estimated: 42 mL/min — ABNORMAL LOW (ref >60)
Glucose, Bld: 98 mg/dL (ref 70–99)
Potassium: 4.5 mmol/L (ref 3.5–5.1)
Sodium: 130 mmol/L — ABNORMAL LOW (ref 135–145)
Total Bilirubin: 0.5 mg/dL (ref 0.3–1.2)
Total Protein: 5.4 g/dL — ABNORMAL LOW (ref 6.5–8.1)

## 2021-04-08 LAB — CBC
HCT: 25.7 % — ABNORMAL LOW (ref 36.0–46.0)
Hemoglobin: 8.2 g/dL — ABNORMAL LOW (ref 12.0–15.0)
MCH: 31.3 pg (ref 26.0–34.0)
MCHC: 31.9 g/dL (ref 30.0–36.0)
MCV: 98.1 fL (ref 80.0–100.0)
Platelets: 253 10*3/uL (ref 150–400)
RBC: 2.62 MIL/uL — ABNORMAL LOW (ref 3.87–5.11)
RDW: 15.2 % (ref 11.5–15.5)
WBC: 7.3 10*3/uL (ref 4.0–10.5)
nRBC: 0 % (ref 0.0–0.2)

## 2021-04-08 LAB — C DIFFICILE QUICK SCREEN W PCR REFLEX
C Diff antigen: NEGATIVE
C Diff interpretation: NOT DETECTED
C Diff toxin: NEGATIVE

## 2021-04-08 LAB — MAGNESIUM: Magnesium: 2.6 mg/dL — ABNORMAL HIGH (ref 1.7–2.4)

## 2021-04-08 LAB — OCCULT BLOOD X 1 CARD TO LAB, STOOL: Fecal Occult Bld: NEGATIVE

## 2021-04-08 MED ORDER — SODIUM CHLORIDE 0.9 % IV SOLN
2.0000 g | INTRAVENOUS | Status: DC
  Administered 2021-04-09 – 2021-04-15 (×7): 2 g via INTRAVENOUS
  Filled 2021-04-08 (×7): qty 20

## 2021-04-08 MED ORDER — SODIUM CHLORIDE 0.9 % IV SOLN
2.0000 g | Freq: Two times a day (BID) | INTRAVENOUS | Status: DC
  Administered 2021-04-08: 2 g via INTRAVENOUS
  Filled 2021-04-08: qty 2

## 2021-04-08 MED ORDER — MIDODRINE HCL 5 MG PO TABS
5.0000 mg | ORAL_TABLET | Freq: Three times a day (TID) | ORAL | Status: DC
Start: 2021-04-09 — End: 2021-04-09
  Filled 2021-04-08: qty 1

## 2021-04-08 MED ORDER — MENTHOL 3 MG MT LOZG
1.0000 | LOZENGE | OROMUCOSAL | Status: DC | PRN
  Administered 2021-04-08 – 2021-04-13 (×2): 3 mg via ORAL
  Filled 2021-04-08 (×2): qty 9

## 2021-04-08 MED ORDER — PHENOL 1.4 % MT LIQD: 1.0000 | OROMUCOSAL | Status: DC | PRN

## 2021-04-08 NOTE — Hospital Course (Addendum)
81 year old female admitted to the hospital with abdominal pain, frequent stools, dehydration.  Found to have diverticulitis with acute kidney injury.  She was also having vomiting and subsequently developed hypoxia.  There was concern for possible aspiration.  She was treated with IV fluids and antibiotics.  Overall renal function is improving.  Diet is being advanced.  Diverticulitis appears to be improving with antibiotics.  Seen by physical therapy with recommendations for skilled nursing facility placement  Hospitalization complicated by generalized weakness, workup did not reveal Diane Torres likely cause, suspect this was due to her acute illness.  Weakness is improving at discharge. ? ?See below for additional details ?

## 2021-04-08 NOTE — Assessment & Plan Note (Signed)
CKD stage 3A ?Baseline creatinine approx 1.0 ?

## 2021-04-08 NOTE — Assessment & Plan Note (Addendum)
Baseline hemoglobin between 10-11 ?Hemoglobin is 11.8 on admission, this is trended down to ~8 with IV fluids ?Suspect some degree of hemodilution, may also have anemia of critical illness ?FOBT negative, and no reported visible blood loss ?Continue to monitor ?Follow outpatient ?

## 2021-04-08 NOTE — Progress Notes (Signed)
Pharmacy Antibiotic Note ? ?Diane Torres is a 81 y.o. female admitted on 04/06/2021 with  diverticulitis and PNA .  Pharmacy has been consulted for cefepime dosing. ?Scr improved. ? ?Plan: ?Change Cefepime 2gm IV q12h ?F/U cxs and clinical progress ?Monitor V/S, labs and levels as indicated  ? ?Height: '5\' 1"'$  (154.9 cm) ?Weight: 70.3 kg (155 lb) ?IBW/kg (Calculated) : 47.8 ? ?Temp (24hrs), Avg:98.4 ?F (36.9 ?C), Min:97.8 ?F (36.6 ?C), Max:99.2 ?F (37.3 ?C) ? ?Recent Labs  ?Lab 04/06/21 ?1910 04/06/21 ?2105 04/07/21 ?6160 04/07/21 ?1915 04/08/21 ?0445  ?WBC 7.8  --  7.2 9.2 7.3  ?CREATININE 1.67*  --  1.50* 1.27* 1.27*  ?LATICACIDVEN 1.8 1.0  --   --   --   ? ?  ?Estimated Creatinine Clearance: 31.2 mL/min (A) (by C-G formula based on SCr of 1.27 mg/dL (H)).   ? ?Allergies  ?Allergen Reactions  ? Bee Venom Anaphylaxis  ? ? ?Antimicrobials this admission: ?Vancomycin  3/4 >> 3/5 ?Cefepime 3/4 >>  ?Flagyl 3/4>> ? ?Microbiology results: ?3/4 BCx: ngtd ?3/4 UCx: pending  ? MRSA PCR:  ? ?Thank you for allowing pharmacy to be a part of this patient?s care. ? ?Isac Sarna, BS Pharm D, BCPS ?Clinical Pharmacist ?Pager [phone] ?04/08/2021 1:40 PM ? ?

## 2021-04-08 NOTE — Progress Notes (Signed)
?  Progress Note ? ? ?Patient: Diane Torres EXB:[id number] DOB: [birth year] DOA: 04/06/2021     2 ?DOS: the patient was seen and examined on 04/08/2021 ?  ?Brief hospital course: ?81 year old female admitted to the hospital with abdominal pain, frequent stools, dehydration.  Found to have diverticulitis with acute kidney injury.  She was also having vomiting and subsequently developed hypoxia.  There was concern for possible aspiration.  She has been continued on IV fluids and antibiotics.  Overall renal function is improving.  Diet is being advanced.  She is very weak and physical therapy evaluation has been requested ? ?Assessment and Plan: ?* Diverticulitis ?Noted to have right-sided diverticulitis ?Continue IV antibiotics, change cefepime to ceftriaxone.  Continue Flagyl ? ?Generalized weakness ?Physical therapy evaluation ? ?Normocytic anemia ?Baseline hemoglobin between 10-11 ?Hemoglobin is 11.8 on admission, this is trended down to 8.8 with IV fluids ?Suspect some degree of hemodilution, may also have anemia of critical illness ?FOBT negative, and no reported visible blood loss ?Continue to monitor ? ?Hyperkalemia ?5.7, likely secondary to AKI ?Resolved after receiving IV fluids ? ?Acute respiratory failure with hypoxia ([place]) ?Hypoxia and cough after several episodes of vomiting ?Possible aspiration ?In the aspiration would already be covered by antibiotics on board for diverticulitis ?Wean off O2 as tolerated, right now requiring 2 L ?Breathing treatments as needed ?Continue to monitor ? ?Sepsis ([place]) ?On admission: HR 106, R 21, Temp 103.2, AKI, acute respiratory failure with hypoxia ?Diverticulitis ?Started on broad-spectrum antibiotics ?Hydrated with IV fluids ?Overall sepsis physiology appears to have resolved ? ? ?AKI (acute kidney injury) ([place]) ?Admitted with a creatinine of 1.6 ?Holding ARB ?Started on IV fluids ?Creatinine down to 1.2 ?Continue to monitor ? ?Essential hypertension ?Blood pressures have been  soft, holding losartan ? ?CKD (chronic kidney disease) stage 3, GFR 30-59 ml/min (HCC) ?CKD stage 3A ?Baseline creatinine approx 1.0 ? ?GERD (gastroesophageal reflux disease) ?Continue Protonix ? ?Mixed hyperlipidemia ?Continue statin ? ? ? ? ?  ? ?Subjective: Continues to have frequent loose stools.  Still has some abdominal discomfort.  Still has some nausea.  She is noted that she is wheezing ? ?Physical Exam: ?Vitals:  ? 04/07/21 2000 04/08/21 0200 04/08/21 0500 04/08/21 1431  ?BP: (!) 132/52 (!) 133/52 (!) 114/41 (!) 138/52  ?Pulse: 83 83 79 73  ?Resp: '18 18 20 18  '$ ?Temp: 98.5 ?F (36.9 ?C) 99.2 ?F (37.3 ?C) 98.5 ?F (36.9 ?C) 98.4 ?F (36.9 ?C)  ?TempSrc: Oral Oral Oral Oral  ?SpO2: 97% 99% 100% 100%  ?Weight:      ?Height:      ? ?General exam: Alert, awake, oriented x 3 ?Respiratory system: Mild wheeze bilaterally. Respiratory effort normal. ?Cardiovascular system:RRR. No murmurs, rubs, gallops. ?Gastrointestinal system: Abdomen is nondistended, soft and nontender. No organomegaly or masses felt. Normal bowel sounds heard. ?Central nervous system: Alert and oriented. No focal neurological deficits. ?Extremities: 1+ edema ?Skin: No rashes, lesions or ulcers ?Psychiatry: Judgement and insight appear normal. Mood & affect appropriate.  ? ?Data Reviewed: ? ?Reviewed CBC and chemistry ? ?Family Communication: Updated husband at the bedside ? ?Disposition: ?Status is: Inpatient ?Remains inpatient appropriate because: Continued treatment for acute diverticulitis with IV antibiotics ? Planned Discharge Destination:  To be determined pending physical therapy evaluation ? ? ? ?Time spent: 35 minutes ? ?Author: ?Kathie Dike, MD ?04/08/2021 8:15 PM ? ?For on call review [email or web address].  ?

## 2021-04-08 NOTE — Assessment & Plan Note (Addendum)
Generalized weakness likely 2/2 systemic illness, imaging below to r/o other etiologies ?Has bilateral 4/5 weakness to lower extremities, upper extremity weakness more impressive (significant breakaway weakness) ?Imaging as below, no sensory deficits ?MRI lumbar spine with stable L1-2 disc protrusion and right cranial disc extrusion, moderate to severe right and mild L neural foraminal narrowing, subarticular recess narrowing with mass effect on bilateral L2 nervs, R>L - moderate spinal canal stenosis at L2-3 with effacement of lateral recesses with mass effect on bilateral L3 nerve roots, L>R and moderate L foraminal - broad based disc protrusion at L3-4 with narrowing of bilateral recesses and mass effect on L4 nerve roots bilaterally ?Discussed lumbar spine findings with nsgy, no surgical indication at this point based on discussion ?MRI T and C spine without obvious cause ? follow MRI brain without acute intracranial process - stable ?TSH wnl, and CK mildly elevated ?Would follow outpatient  ?

## 2021-04-09 ENCOUNTER — Inpatient Hospital Stay (HOSPITAL_COMMUNITY): Payer: Medicare Other

## 2021-04-09 DIAGNOSIS — J9601 Acute respiratory failure with hypoxia: Secondary | ICD-10-CM | POA: Diagnosis not present

## 2021-04-09 DIAGNOSIS — N1831 Chronic kidney disease, stage 3a: Secondary | ICD-10-CM | POA: Diagnosis not present

## 2021-04-09 DIAGNOSIS — K5792 Diverticulitis of intestine, part unspecified, without perforation or abscess without bleeding: Secondary | ICD-10-CM | POA: Diagnosis not present

## 2021-04-09 DIAGNOSIS — N179 Acute kidney failure, unspecified: Secondary | ICD-10-CM | POA: Diagnosis not present

## 2021-04-09 DIAGNOSIS — E871 Hypo-osmolality and hyponatremia: Secondary | ICD-10-CM | POA: Diagnosis present

## 2021-04-09 DIAGNOSIS — E872 Acidosis, unspecified: Secondary | ICD-10-CM | POA: Diagnosis not present

## 2021-04-09 DIAGNOSIS — N281 Cyst of kidney, acquired: Secondary | ICD-10-CM

## 2021-04-09 LAB — URINE CULTURE: Culture: NO GROWTH

## 2021-04-09 LAB — COMPREHENSIVE METABOLIC PANEL
ALT: 11 U/L (ref 0–44)
AST: 13 U/L — ABNORMAL LOW (ref 15–41)
Albumin: 2.4 g/dL — ABNORMAL LOW (ref 3.5–5.0)
Alkaline Phosphatase: 41 U/L (ref 38–126)
Anion gap: 11 (ref 5–15)
BUN: 18 mg/dL (ref 8–23)
CO2: 17 mmol/L — ABNORMAL LOW (ref 22–32)
Calcium: 8 mg/dL — ABNORMAL LOW (ref 8.9–10.3)
Chloride: 105 mmol/L (ref 98–111)
Creatinine, Ser: 1.1 mg/dL — ABNORMAL HIGH (ref 0.44–1.00)
GFR, Estimated: 50 mL/min — ABNORMAL LOW (ref >60)
Glucose, Bld: 82 mg/dL (ref 70–99)
Potassium: 4.4 mmol/L (ref 3.5–5.1)
Sodium: 133 mmol/L — ABNORMAL LOW (ref 135–145)
Total Bilirubin: 0.7 mg/dL (ref 0.3–1.2)
Total Protein: 5.6 g/dL — ABNORMAL LOW (ref 6.5–8.1)

## 2021-04-09 LAB — CBC
HCT: 26.1 % — ABNORMAL LOW (ref 36.0–46.0)
Hemoglobin: 8.1 g/dL — ABNORMAL LOW (ref 12.0–15.0)
MCH: 30.1 pg (ref 26.0–34.0)
MCHC: 31 g/dL (ref 30.0–36.0)
MCV: 97 fL (ref 80.0–100.0)
Platelets: 256 10*3/uL (ref 150–400)
RBC: 2.69 MIL/uL — ABNORMAL LOW (ref 3.87–5.11)
RDW: 14.8 % (ref 11.5–15.5)
WBC: 7.7 10*3/uL (ref 4.0–10.5)
nRBC: 0 % (ref 0.0–0.2)

## 2021-04-09 MED ORDER — IPRATROPIUM-ALBUTEROL 0.5-2.5 (3) MG/3ML IN SOLN
3.0000 mL | Freq: Four times a day (QID) | RESPIRATORY_TRACT | Status: DC
Start: 2021-04-09 — End: 2021-04-10
  Administered 2021-04-09 – 2021-04-10 (×4): 3 mL via RESPIRATORY_TRACT
  Filled 2021-04-09 (×6): qty 3

## 2021-04-09 MED ORDER — SODIUM BICARBONATE 650 MG PO TABS
1300.0000 mg | ORAL_TABLET | Freq: Two times a day (BID) | ORAL | Status: DC
  Administered 2021-04-09 – 2021-04-15 (×13): 1300 mg via ORAL
  Filled 2021-04-09 (×13): qty 2

## 2021-04-09 NOTE — Assessment & Plan Note (Addendum)
resolved 

## 2021-04-09 NOTE — Evaluation (Signed)
Physical Therapy Evaluation ?Patient Details ?Name: Diane Torres ?MRN: [id number] ?DOB: [birth year] ?Today's Date: 04/09/2021 ? ?History of Present Illness ? Diane Torres is a 81 y.o. female with medical history significant of with history significant for GERD, hypertension, mixed hyperlipidemia, diverticulitis, presents ED with a chief complaint of fever.  Patient reports that she has had a decreased appetite and decreased p.o. intake for several days.  Her daughter reports that she was in her normal good spirits this morning when they were running errands.  She laid down for a nap and when she woke up she had chills, fever, vomiting.  Daughter confirms that patient had not eaten the whole day.  They report several episodes of vomiting yellow liquid.  No blood.  Patient denies any associated belly pain.  She does not remember when her last normal bowel movement was, but reports that she has had diarrhea for 2 nights in a row.  She does not know when her last normal meal was either, but thinks its been several days.  She has noticed a darkening color to her urine, but no blood.  No dysuria.  Patient has no further complaints at this time. ?  ?Clinical Impression ? Patient demonstrates slow labored movement for sitting up at bedside requiring frequent rest breaks, very unsteady on feet and unable to ambulate without an AD.  Patient required use of RW demonstrating slow slightly labored cadence without loss of balance and limited mostly due to fatigue.  Patient tolerated sitting up in chair after therapy with her spouse in room - RN notified.  Patient will benefit from continued skilled physical therapy in hospital and recommended venue below to increase strength, balance, endurance for safe ADLs and gait.  ? ?   ?   ? ?Recommendations for follow up therapy are one component of a multi-disciplinary discharge planning process, led by the attending physician.  Recommendations may be updated based on patient status,  additional functional criteria and insurance authorization. ? ?Follow Up Recommendations Skilled nursing-short term rehab (<3 hours/day) ? ?  ?Assistance Recommended at Discharge Set up Supervision/Assistance  ?Patient can return home with the following ? A little help with walking and/or transfers;A little help with bathing/dressing/bathroom;Help with stairs or ramp for entrance;Assistance with cooking/housework ? ?  ?Equipment Recommendations Rolling walker (2 wheels)  ?Recommendations for Other Services ?    ?  ?Functional Status Assessment Patient has had a recent decline in their functional status and demonstrates the ability to make significant improvements in function in a reasonable and predictable amount of time.  ? ?  ?Precautions / Restrictions Precautions ?Precautions: Fall ?Restrictions ?Weight Bearing Restrictions: No  ? ?  ? ?Mobility ? Bed Mobility ?Overal bed mobility: Needs Assistance ?Bed Mobility: Supine to Sit ?  ?  ?Supine to sit: Min guard ?  ?  ?General bed mobility comments: increased time, labored movement, frequent rest breaks ?  ? ?Transfers ?Overall transfer level: Needs assistance ?Equipment used: Rolling walker (2 wheels), None ?Transfers: Sit to/from Stand, Bed to chair/wheelchair/BSC ?Sit to Stand: Min assist, Mod assist ?  ?Step pivot transfers: Min assist, Mod assist ?  ?  ?  ?General transfer comment: unable to maintain standing balance without leaning on nearby objects for support, required use of RW for safety ?  ? ?Ambulation/Gait ?Ambulation/Gait assistance: Min assist ?Gait Distance (Feet): 40 Feet ?Assistive device: Rolling walker (2 wheels) ?Gait Pattern/deviations: Decreased step length - right, Decreased step length - left, Decreased stride length ?Gait velocity: decreased ?  ?  ?  General Gait Details: slow labored slightly unsteady cadence without loss of balance, limited mostly due to fatigue ? ?Stairs ?  ?  ?  ?  ?  ? ?Wheelchair Mobility ?  ? ?Modified Rankin (Stroke  Patients Only) ?  ? ?  ? ?Balance Overall balance assessment: Needs assistance ?Sitting-balance support: Feet supported, No upper extremity supported ?Sitting balance-Leahy Scale: Fair ?Sitting balance - Comments: fair/good seated at EOB ?  ?Standing balance support: During functional activity, No upper extremity supported ?Standing balance-Leahy Scale: Poor ?Standing balance comment: fair using RW ?  ?  ?  ?  ?  ?  ?  ?  ?  ?  ?  ?   ? ? ? ?Pertinent Vitals/Pain Pain Assessment ?Pain Assessment: No/denies pain  ? ? ?Home Living Family/patient expects to be discharged to:: Private residence ?Living Arrangements: Spouse/significant other ?Available Help at Discharge: Family;Available 24 hours/day ?Type of Home: House ?Home Access: Stairs to enter ?  ?Entrance Stairs-Number of Steps: 2 ?  ?Home Layout: One level ?Home Equipment: None ?   ?  ?Prior Function Prior Level of Function : Independent/Modified Independent ?  ?  ?  ?  ?  ?  ?Mobility Comments: Hydrographic surveyor, drives ?ADLs Comments: Independent ?  ? ? ?Hand Dominance  ? Dominant Hand: Right ? ?  ?Extremity/Trunk Assessment  ? Upper Extremity Assessment ?Upper Extremity Assessment: Generalized weakness ?  ? ?Lower Extremity Assessment ?Lower Extremity Assessment: Generalized weakness ?  ? ?Cervical / Trunk Assessment ?Cervical / Trunk Assessment: Normal  ?Communication  ? Communication: No difficulties  ?Cognition Arousal/Alertness: Awake/alert ?Behavior During Therapy: [facility] for tasks assessed/performed ?Overall Cognitive Status: Within Functional Limits for tasks assessed ?  ?  ?  ?  ?  ?  ?  ?  ?  ?  ?  ?  ?  ?  ?  ?  ?  ?  ?  ? ?  ?General Comments   ? ?  ?Exercises    ? ?Assessment/Plan  ?  ?PT Assessment Patient needs continued PT services  ?PT Problem List Decreased strength;Decreased activity tolerance;Decreased balance;Decreased mobility ? ?   ?  ?PT Treatment Interventions DME instruction;Gait training;Stair training;Functional mobility  training;Therapeutic activities;Therapeutic exercise;Balance training;Patient/family education   ? ?PT Goals (Current goals can be found in the Care Plan section)  ?Acute Rehab PT Goals ?Patient Stated Goal: return home with family to assist ?PT Goal Formulation: With patient/family ?Time For Goal Achievement: 04/23/21 ?Potential to Achieve Goals: Good ? ?  ?Frequency Min 3X/week ?  ? ? ?Co-evaluation   ?  ?  ?  ?  ? ? ?  ?AM-PAC PT "6 Clicks" Mobility  ?Outcome Measure Help needed turning from your back to your side while in a flat bed without using bedrails?: A Little ?Help needed moving from lying on your back to sitting on the side of a flat bed without using bedrails?: A Little ?Help needed moving to and from a bed to a chair (including a wheelchair)?: A Lot ?Help needed standing up from a chair using your arms (e.g., wheelchair or bedside chair)?: A Lot ?Help needed to walk in hospital room?: A Lot ?Help needed climbing 3-5 steps with a railing? : A Lot ?6 Click Score: 14 ? ?  ?End of Session   ?Activity Tolerance: Patient tolerated treatment well ?Patient left: in chair;with call bell/phone within reach;with family/visitor present ?Nurse Communication: Mobility status ?PT Visit Diagnosis: Unsteadiness on feet (R26.81);Other abnormalities of gait and mobility (R26.89) ?  ? ?  Time: 8832-5498 ?PT Time Calculation (min) (ACUTE ONLY): 23 min ? ? ?Charges:   PT Evaluation ?$PT Eval Moderate Complexity: 1 Mod ?PT Treatments ?$Therapeutic Activity: 23-37 mins ?  ?   ? ? ?2:19 PM, 04/09/21 ?Lonell Grandchild, MPT ?Physical Therapist with [place] ?[facility] ?[phone] office ?[phone] mobile phone ? ? ?

## 2021-04-09 NOTE — TOC Initial Note (Signed)
Transition of Care (TOC) - Initial/Assessment Note  ? ? ?Patient Details  ?Name: Diane Torres ?MRN: [id number] ?Date of Birth: [birth year] ? ?Transition of Care (TOC) CM/SW Contact:    ?Salome Arnt, LCSW ?Phone Number: ?04/09/2021, 10:00 AM ? ?Clinical Narrative:  Pt admitted due to acute kidney injury. She reports she lives with her husband and is typically independent with ADLs. Several days ago, pt indicates she was unable to ambulate. PT evaluated pt and recommend SNF. Discussed with pt. Although she states she is improving, she feels short term rehab would be helpful. Pt requesting placement in [place]. Will initiate bed search and authorization.                ? ? ?Expected Discharge Plan: [place] ?Barriers to Discharge: Continued Medical Work up ? ? ?Patient Goals and CMS Choice ?Patient states their goals for this hospitalization and ongoing recovery are:: short term rehab ?  ?Choice offered to / list presented to : Patient ? ?Expected Discharge Plan and Services ?Expected Discharge Plan: [place] ?In-house Referral: Clinical Social Work ?  ?Post Acute Care Choice: [place] ?Living arrangements for the past 2 months: [place] ?                ?  ?  ?  ?  ?  ?  ?  ?  ?  ?  ? ?Prior Living Arrangements/Services ?Living arrangements for the past 2 months: [place] ?Lives with:: Spouse ?Patient language and need for interpreter reviewed:: Yes ?Do you feel safe going back to the place where you live?: Yes      ?Need for Family Participation in Patient Care: No (Comment) ?  ?  ?Criminal Activity/Legal Involvement Pertinent to Current Situation/Hospitalization: No - Comment as needed ? ?Activities of Daily Living ?Home Assistive Devices/Equipment: Eyeglasses ?ADL Screening (condition at time of admission) ?Patient's cognitive ability adequate to safely complete daily activities?: Yes ?Is the patient deaf or have difficulty hearing?:  No ?Does the patient have difficulty seeing, even when wearing glasses/contacts?: No ?Does the patient have difficulty concentrating, remembering, or making decisions?: No ?Patient able to express need for assistance with ADLs?: Yes ?Does the patient have difficulty dressing or bathing?: No ?Independently performs ADLs?: Yes (appropriate for developmental age) ?Does the patient have difficulty walking or climbing stairs?: No ?Weakness of Legs: Both ?Weakness of Arms/Hands: None ? ?Permission Sought/Granted ?  ?  ?   ?   ?   ?   ? ?Emotional Assessment ?  ?  ?Affect (typically observed): Appropriate ?Orientation: : Oriented to Self, Oriented to Place, Oriented to  Time, Oriented to Situation ?Alcohol / Substance Use: Not Applicable ?Psych Involvement: No (comment) ? ?Admission diagnosis:  Diverticulitis [K57.92] ?AKI (acute kidney injury) ([place]) [N17.9] ?Sepsis ([place]) [A41.9] ?Patient Active Problem List  ? Diagnosis Date Noted  ? Normocytic anemia 04/08/2021  ? Generalized weakness 04/08/2021  ? Sepsis ([place]) 04/07/2021  ? Acute respiratory failure with hypoxia ([place]) 04/07/2021  ? Hyperkalemia 04/07/2021  ? AKI (acute kidney injury) ([place]) 04/06/2021  ? Pituitary mass ([place]) 10/28/2020  ? Intracranial mass 10/28/2020  ? Macrocytic anemia 10/28/2020  ? Chronic back pain 10/28/2020  ? Essential hypertension 10/28/2020  ? Syncope 10/27/2020  ? Diarrhea 12/02/2018  ? Diverticulosis 12/02/2018  ? Diverticulitis 06/30/2017  ? Rectal bleeding 06/30/2017  ? Leukocytosis 06/30/2017  ? CKD (chronic kidney disease) stage 3, GFR 30-59 ml/min (HCC) 06/30/2017  ? LLQ pain 12/23/2016  ?  GERD (gastroesophageal reflux disease) 06/16/2016  ? Gastroesophageal reflux disease with esophagitis 06/16/2016  ? Precordial pain 03/10/2013  ? Impaired fasting glucose 03/09/2013  ? Mixed hyperlipidemia 03/09/2013  ? Hypothyroidism 03/09/2013  ? ?PCP:  Celene Squibb, MD ?Pharmacy:   ?[place], [address] ?[place] [address] ?Phone: [phone] Fax: [phone] ? ? ? ? ?Social Determinants of Health (SDOH) Interventions ?  ? ?Readmission Risk Interventions ?No flowsheet data found. ? ? ?

## 2021-04-09 NOTE — Assessment & Plan Note (Addendum)
Incidental finding on CT abdomen ?Renal ultrasound did not definitively visualize cyst ?Can consider follow-up renal cyst protocol CTx ?

## 2021-04-09 NOTE — NC FL2 (Signed)
?[place] MEDICAID FL2 LEVEL OF CARE SCREENING TOOL  ?  ? ?IDENTIFICATION  ?Patient Name: ?Diane Torres Birthdate: 1940-04-15 Sex: female Admission Date (Current Location): ?04/06/2021  ?[place] and [place] Number: ? [place] and Address:  ?[facility] [address] [place] ?     Provider Number: ?[id number]  ?Attending Physician Name and Address:  ?Kathie Dike, MD ? Relative Name and Phone Number:  ?  ?   ?Current Level of Care: ?Hospital Recommended Level of Care: ?[place] Prior Approval Number: ?  ? ?Date Approved/Denied: ?  PASRR Number: ?[id number] ? ?Discharge Plan: ?SNF ?  ? ?Current Diagnoses: ?Patient Active Problem List  ? Diagnosis Date Noted  ? Normocytic anemia 04/08/2021  ? Generalized weakness 04/08/2021  ? Sepsis ([place]) 04/07/2021  ? Acute respiratory failure with hypoxia ([place]) 04/07/2021  ? Hyperkalemia 04/07/2021  ? AKI (acute kidney injury) ([place]) 04/06/2021  ? Pituitary mass ([place]) 10/28/2020  ? Intracranial mass 10/28/2020  ? Macrocytic anemia 10/28/2020  ? Chronic back pain 10/28/2020  ? Essential hypertension 10/28/2020  ? Syncope 10/27/2020  ? Diarrhea 12/02/2018  ? Diverticulosis 12/02/2018  ? Diverticulitis 06/30/2017  ? Rectal bleeding 06/30/2017  ? Leukocytosis 06/30/2017  ? CKD (chronic kidney disease) stage 3, GFR 30-59 ml/min (HCC) 06/30/2017  ? LLQ pain 12/23/2016  ? GERD (gastroesophageal reflux disease) 06/16/2016  ? Gastroesophageal reflux disease with esophagitis 06/16/2016  ? Precordial pain 03/10/2013  ? Impaired fasting glucose 03/09/2013  ? Mixed hyperlipidemia 03/09/2013  ? Hypothyroidism 03/09/2013  ? ? ?Orientation RESPIRATION BLADDER Height & Weight   ?  ?Self, Time, Place, Situation ? Normal External catheter Weight: 155 lb (70.3 kg) ?Height:  '5\' 1"'$  (154.9 cm)  ?BEHAVIORAL SYMPTOMS/MOOD NEUROLOGICAL BOWEL NUTRITION STATUS  ?    Incontinent Diet (Soft diet. See d/c summary for updates.)  ?AMBULATORY STATUS  COMMUNICATION OF NEEDS Skin   ?Extensive Assist Verbally Normal ?  ?  ?  ?    ?     ?     ? ? ?Personal Care Assistance Level of Assistance  ?Dressing, Feeding, Bathing Bathing Assistance: Maximum assistance ?Feeding assistance: Limited assistance ?Dressing Assistance: Maximum assistance ?   ? ?Functional Limitations Info  ?Sight, Hearing, Speech Sight Info: Impaired ?Hearing Info: Adequate ?Speech Info: Adequate  ? ? ?SPECIAL CARE FACTORS FREQUENCY  ?PT (By licensed PT)   ?  ?PT Frequency: 5x weekly ?  ?  ?  ?  ?   ? ? ?Contractures    ? ? ?Additional Factors Info  ?Code Status, Allergies Code Status Info: Full code ?Allergies Info: Bee venom ?  ?  ?  ?   ? ?Current Medications (04/09/2021):  This is the current hospital active medication list ?Current Facility-Administered Medications  ?Medication Dose Route Frequency Provider Last Rate Last Admin  ? acetaminophen (TYLENOL) tablet 650 mg  650 mg Oral Q6H PRN Zierle-Ghosh, Asia B, DO   650 mg at 04/07/21 0108  ? Or  ? acetaminophen (TYLENOL) suppository 650 mg  650 mg Rectal Q6H PRN Zierle-Ghosh, Asia B, DO      ? albuterol (PROVENTIL) (2.5 MG/3ML) 0.083% nebulizer solution 2.5 mg  2.5 mg Nebulization Q4H PRN Zierle-Ghosh, Asia B, DO      ? cefTRIAXone (ROCEPHIN) 2 g in sodium chloride 0.9 % 100 mL IVPB  2 g Intravenous Q24H Kathie Dike, MD 200 mL/hr at 04/09/21 0030 2 g at 04/09/21 0030  ? menthol-cetylpyridinium (CEPACOL) lozenge 3 mg  1 lozenge Oral PRN Kathie Dike, MD   3 mg at 04/08/21 4142  ? metroNIDAZOLE (FLAGYL) IVPB 500 mg  500 mg Intravenous Q12H Zierle-Ghosh, Asia B, DO 100 mL/hr at 04/09/21 0801 500 mg at 04/09/21 0801  ? ondansetron (ZOFRAN) tablet 4 mg  4 mg Oral Q6H PRN Zierle-Ghosh, Asia B, DO      ? Or  ? ondansetron (ZOFRAN) injection 4 mg  4 mg Intravenous Q6H PRN Zierle-Ghosh, Asia B, DO   4 mg at 04/07/21 0042  ? oxyCODONE (Oxy IR/ROXICODONE) immediate release tablet 5 mg  5 mg Oral Q4H PRN Zierle-Ghosh, Asia B, DO   5 mg at 04/09/21  0751  ? pantoprazole (PROTONIX) EC tablet 40 mg  40 mg Oral Daily Zierle-Ghosh, Asia B, DO   40 mg at 04/09/21 0801  ? phenol (CHLORASEPTIC) mouth spray 1 spray  1 spray Mouth/Throat PRN Kathie Dike, MD      ? simvastatin (ZOCOR) tablet 40 mg  40 mg Oral QHS Zierle-Ghosh, Asia B, DO   40 mg at 04/08/21 2024  ? ? ? ?Discharge Medications: ?Please see discharge summary for a list of discharge medications. ? ?Relevant Imaging Results: ? ?Relevant Lab Results: ? ? ?Additional Information ?SSN: [id number] ? ?Salome Arnt, LCSW ? ? ? ? ?

## 2021-04-09 NOTE — Progress Notes (Addendum)
?Progress Note ? ? ?Patient: Diane Torres ULA:[id number] DOB: 04-06-1940 DOA: 04/06/2021     3 ?DOS: the patient was seen and examined on 04/09/2021 ?  ?Brief hospital course: ?81 year old female admitted to the hospital with abdominal pain, frequent stools, dehydration.  Found to have diverticulitis with acute kidney injury.  She was also having vomiting and subsequently developed hypoxia.  There was concern for possible aspiration.  She was treated with IV fluids and antibiotics.  Overall renal function is improving.  Diet is being advanced.  Diverticulitis appears to be improving with antibiotics.  Seen by physical therapy with recommendations for skilled nursing facility placement ? ?Assessment and Plan: ?* Diverticulitis ?Noted to have right-sided diverticulitis ?Currently on IV antibiotics ?Reports that stool frequency is improving ?P.o. intake appears to be improving ?Can likely transition to oral antibiotics tomorrow if she continues to improve ? ?Hyponatremia ?Likely related to hypovolemia ?Improved with IV hydration ? ?Renal cyst, right ?Incidental finding on CT abdomen ?Renal ultrasound did not definitively visualize cyst ?Can consider follow-up renal cyst protocol CT in 6 months ? ?Metabolic acidosis, normal anion gap (NAG) ?Likely related to GI losses ?Start oral bicarbonate replacement ? ?Generalized weakness ?Physical therapy evaluation with recommendations for skilled nursing facility placement ? ?Normocytic anemia ?Baseline hemoglobin between 10-11 ?Hemoglobin is 11.8 on admission, this is trended down to 8.8 with IV fluids ?Suspect some degree of hemodilution, may also have anemia of critical illness ?FOBT negative, and no reported visible blood loss ?Continue to monitor ? ?Hyperkalemia ?5.7, likely secondary to AKI ?Resolved after receiving IV fluids ? ?Acute respiratory failure with hypoxia ([place]) ?Hypoxia and cough after several episodes of vomiting ?Possible aspiration ?Aspiration would already be  covered by antibiotics on board for diverticulitis ?She is currently been weaned off of oxygen ?Breathing treatments as needed ?Since she continues to have wheezing, will repeat chest x-ray ?Continue to monitor ? ?Sepsis ([place]) ?On admission: HR 106, R 21, Temp 103.2, AKI, acute respiratory failure with hypoxia ?Diverticulitis ?Started on broad-spectrum antibiotics ?Hydrated with IV fluids ?Overall sepsis physiology appears to have resolved ? ? ?AKI (acute kidney injury) ([place]) ?Admitted with a creatinine of 1.6 ?Holding ARB ?Started on IV fluids ?Creatinine down to 1.1 ?Continue to monitor ? ?Essential hypertension ?Blood pressures have been soft, holding losartan ? ?CKD (chronic kidney disease) stage 3, GFR 30-59 ml/min (HCC) ?CKD stage 3A ?Baseline creatinine approx 1.0 ? ?GERD (gastroesophageal reflux disease) ?Continue Protonix ? ?Mixed hyperlipidemia ?Continue statin ? ? ? ? ?  ? ?Subjective: Reports that stool frequency is less today.  Stool volume is also decreasing.  Stools are greenish color.  Continues to have some wheeze.  Overall p.o. intake improving ? ?Physical Exam: ?Vitals:  ? 04/09/21 0514 04/09/21 0751 04/09/21 1332 04/09/21 2046  ?BP: (!) 154/54 (!) 158/61 (!) 133/56   ?Pulse: 78 81 87   ?Resp: '18 17 20   '$ ?Temp: 98 ?F (36.7 ?C) 97.7 ?F (36.5 ?C) 98.9 ?F (37.2 ?C)   ?TempSrc:  Oral Oral   ?SpO2: 100% 95% 96% (!) 86%  ?Weight:      ?Height:      ? ?General exam: Alert, awake, oriented x 3 ?Respiratory system: Mild wheeze bilaterally. Respiratory effort normal. ?Cardiovascular system:RRR. No murmurs, rubs, gallops. ?Gastrointestinal system: Abdomen is nondistended, soft and nontender. No organomegaly or masses felt. Normal bowel sounds heard. ?Central nervous system: Alert and oriented. No focal neurological deficits. ?Extremities: 1+ edema bilaterally ?Skin: No rashes, lesions or ulcers ?Psychiatry: Judgement and insight appear  normal. Mood & affect appropriate.  ? ?Data Reviewed: ? ?Reviewed  chemistry, CBC ? ?Family Communication: Discussed with husband at the bedside ? ?Disposition: ?Status is: Inpatient ?Remains inpatient appropriate because: Continued IV antibiotics for diverticulitis, awaiting placement in skilled nursing facility ? Planned Discharge Destination: Skilled nursing facility ? ? ? ?Time spent: 35 minutes ? ?Author: ?Kathie Dike, MD ?04/09/2021 9:58 PM ? ?For on call review [email or web address].  ?

## 2021-04-09 NOTE — Plan of Care (Signed)
?  Problem: Acute Rehab PT Goals(only PT should resolve) ?Goal: Pt Will Go Supine/Side To Sit ?Outcome: Progressing ?Flowsheets (Taken 04/09/2021 1420) ?Pt will go Supine/Side to Sit: ? with supervision ? with modified independence ?Goal: Patient Will Transfer Sit To/From Stand ?Outcome: Progressing ?Flowsheets (Taken 04/09/2021 1420) ?Patient will transfer sit to/from stand: with supervision ?Goal: Pt Will Transfer Bed To Chair/Chair To Bed ?Outcome: Progressing ?Flowsheets (Taken 04/09/2021 1420) ?Pt will Transfer Bed to Chair/Chair to Bed: ? with supervision ? min guard assist ?Goal: Pt Will Ambulate ?Outcome: Progressing ?Flowsheets (Taken 04/09/2021 1420) ?Pt will Ambulate: ? 75 feet ? with min guard assist ? with minimal assist ? with rolling walker ?  ?2:21 PM, 04/09/21 ?Lonell Grandchild, MPT ?Physical Therapist with [place] ?[facility] ?[phone] office ?[phone] mobile phone ? ?

## 2021-04-09 NOTE — TOC Progression Note (Signed)
Transition of Care (TOC) - Progression Note  ? ? ?Patient Details  ?Name: Diane Torres ?MRN: [id number] ?Date of Birth: 12-04-1940 ? ?Transition of Care (TOC) CM/SW Contact  ?Salome Arnt, LCSW ?Phone Number: ?04/09/2021, 11:58 AM ? ?Clinical Narrative:  Pt [facility] bed at [facility]. Facility notified. CMA has started authorization. TOC will continue to follow.   ? ? ? ?Expected Discharge Plan: [place] ?Barriers to Discharge: Continued Medical Work up ? ?Expected Discharge Plan and Services ?Expected Discharge Plan: [place] ?In-house Referral: Clinical Social Work ?  ?Post Acute Care Choice: [place] ?Living arrangements for the past 2 months: [place] ?                ?  ?  ?  ?  ?  ?  ?  ?  ?  ?  ? ? ?Social Determinants of Health (SDOH) Interventions ?  ? ?Readmission Risk Interventions ?No flowsheet data found. ? ?

## 2021-04-09 NOTE — Progress Notes (Signed)
Came in to give patient breathing treatment and patient was on RA with a sat of 86%.  Will place patient back on [place] after treatment at 2L ?

## 2021-04-09 NOTE — Assessment & Plan Note (Addendum)
Mild, continue to monitor.  

## 2021-04-10 ENCOUNTER — Inpatient Hospital Stay (HOSPITAL_COMMUNITY): Payer: Medicare Other

## 2021-04-10 ENCOUNTER — Encounter (HOSPITAL_COMMUNITY): Payer: Medicare Other | Admitting: Physical Therapy

## 2021-04-10 DIAGNOSIS — K5792 Diverticulitis of intestine, part unspecified, without perforation or abscess without bleeding: Secondary | ICD-10-CM | POA: Diagnosis not present

## 2021-04-10 LAB — COMPREHENSIVE METABOLIC PANEL
ALT: 10 U/L (ref 0–44)
AST: 12 U/L — ABNORMAL LOW (ref 15–41)
Albumin: 2.5 g/dL — ABNORMAL LOW (ref 3.5–5.0)
Alkaline Phosphatase: 45 U/L (ref 38–126)
Anion gap: 8 (ref 5–15)
BUN: 14 mg/dL (ref 8–23)
CO2: 22 mmol/L (ref 22–32)
Calcium: 8.3 mg/dL — ABNORMAL LOW (ref 8.9–10.3)
Chloride: 103 mmol/L (ref 98–111)
Creatinine, Ser: 0.99 mg/dL (ref 0.44–1.00)
GFR, Estimated: 57 mL/min — ABNORMAL LOW (ref >60)
Glucose, Bld: 115 mg/dL — ABNORMAL HIGH (ref 70–99)
Potassium: 3.8 mmol/L (ref 3.5–5.1)
Sodium: 133 mmol/L — ABNORMAL LOW (ref 135–145)
Total Bilirubin: 0.5 mg/dL (ref 0.3–1.2)
Total Protein: 6 g/dL — ABNORMAL LOW (ref 6.5–8.1)

## 2021-04-10 LAB — CBC
HCT: 26.6 % — ABNORMAL LOW (ref 36.0–46.0)
Hemoglobin: 8.3 g/dL — ABNORMAL LOW (ref 12.0–15.0)
MCH: 29.9 pg (ref 26.0–34.0)
MCHC: 31.2 g/dL (ref 30.0–36.0)
MCV: 95.7 fL (ref 80.0–100.0)
Platelets: 303 10*3/uL (ref 150–400)
RBC: 2.78 MIL/uL — ABNORMAL LOW (ref 3.87–5.11)
RDW: 14.6 % (ref 11.5–15.5)
WBC: 9 10*3/uL (ref 4.0–10.5)
nRBC: 0 % (ref 0.0–0.2)

## 2021-04-10 MED ORDER — IOHEXOL 350 MG/ML SOLN
100.0000 mL | Freq: Once | INTRAVENOUS | Status: AC | PRN
  Administered 2021-04-10: 75 mL via INTRAVENOUS

## 2021-04-10 MED ORDER — IPRATROPIUM-ALBUTEROL 0.5-2.5 (3) MG/3ML IN SOLN
3.0000 mL | Freq: Three times a day (TID) | RESPIRATORY_TRACT | Status: DC
  Administered 2021-04-11 – 2021-04-15 (×13): 3 mL via RESPIRATORY_TRACT
  Filled 2021-04-10 (×13): qty 3

## 2021-04-10 NOTE — TOC Progression Note (Signed)
Transition of Care (TOC) - Progression Note  ? ? ?Patient Details  ?Name: ARMENTHA BRANAGAN ?MRN: [id number] ?Date of Birth: Oct 22, 1940 ? ?Transition of Care (TOC) CM/SW Contact  ?Sadaf Przybysz D, LCSW ?Phone Number: ?04/10/2021, 1:46 PM ? ?Clinical Narrative:    ?Patient has received authorization approved 3/8 - 3/10, next review date 3/10, Candace Cruise id # [id number], plan auth id # [id number]. Attending and facility aware.  ? ? ?Expected Discharge Plan: [place] ?Barriers to Discharge: Continued Medical Work up ? ?Expected Discharge Plan and Services ?Expected Discharge Plan: [place] ?In-house Referral: Clinical Social Work ?  ?Post Acute Care Choice: [place] ?Living arrangements for the past 2 months: [place] ?                ?  ?  ?  ?  ?  ?  ?  ?  ?  ?  ? ? ?Social Determinants of Health (SDOH) Interventions ?  ? ?Readmission Risk Interventions ?No flowsheet data found. ? ?

## 2021-04-10 NOTE — Progress Notes (Signed)
PROGRESS NOTE    Diane Torres  XBJ:[id number] DOB: 02-07-1940 DOA: 04/06/2021 PCP: Benita Stabile, MD  Chief Complaint  Patient presents with   Fever    Brief Narrative:  81 year old female admitted to the hospital with abdominal pain, frequent stools, dehydration.  Found to have diverticulitis with acute kidney injury.  She was also having vomiting and subsequently developed hypoxia.  There was concern for possible aspiration.  She was treated with IV fluids and antibiotics.  Overall renal function is improving.  Diet is being advanced.  Diverticulitis appears to be improving with antibiotics.  Seen by physical therapy with recommendations for skilled nursing facility placement    Assessment & Plan:   Principal Problem:   Diverticulitis Active Problems:   Sepsis (HCC)   Generalized weakness   Acute respiratory failure with hypoxia (HCC)   CKD (chronic kidney disease) stage 3, GFR 30-59 ml/min (HCC)   AKI (acute kidney injury) (HCC)   Metabolic acidosis, normal anion gap (NAG)   Hyponatremia   Renal cyst, right   Mixed hyperlipidemia   GERD (gastroesophageal reflux disease)   Essential hypertension   Hyperkalemia   Normocytic anemia   Assessment and Plan: * Diverticulitis CT with mild diverticulitis involving the ascending colon Currently on IV antibiotics Will follow  Sepsis (HCC) On admission: HR 106, R 21, Temp 103.2, AKI, acute respiratory failure with hypoxia Diverticulitis Started on broad-spectrum antibiotics Hydrated with IV fluids Overall sepsis physiology appears to have resolved   Generalized weakness Physical therapy evaluation with recommendations for skilled nursing facility placement She complaining of generalized weakness since her admission, but today specifically mentions lower extremity weakness and difficulty lifting legs off bed MRI lumbar spine with stable L1-2 disc protrusion and right cranial disc extrusion, moderate to severe right and mild L  neural foraminal narrowing, subarticular recess narrowing with mass effect on bilateral L2 nervs, R>L - moderate spinal canal stenosis at L2-3 with effacement of lateral recesses with mass effect on bilateral L3 nerve roots, L>R and moderate L foraminal - broad based disc protrusion at L3-4 with narrowing of bilateral recesses and mass effect on L4 nerve roots bilaterally Will discuss findings with nsgy, consider additional imaging based on her continued exam findings  Acute respiratory failure with hypoxia (HCC) CT PT protocol with small bilateral pleural effusions with overlying atelectasiss and patchy lower lobe infiltrates, concern for aspiration On appropriate abx Wean o2 as able SLP eval  Hyponatremia Mild, continue to monitor  Metabolic acidosis, normal anion gap (NAG) Likely related to GI losses Start oral bicarbonate replacement  AKI (acute kidney injury) (HCC) Renal function improved at this time  CKD (chronic kidney disease) stage 3, GFR 30-59 ml/min (HCC) CKD stage 3A Baseline creatinine approx 1.0  Renal cyst, right Incidental finding on CT abdomen Renal ultrasound did not definitively visualize cyst Can consider follow-up renal cyst protocol CTx  Mixed hyperlipidemia Continue statin  GERD (gastroesophageal reflux disease) Continue Protonix  Essential hypertension Blood pressures have been soft, holding losartan  Hyperkalemia 5.7, likely secondary to AKI Resolved after receiving IV fluids  Normocytic anemia Baseline hemoglobin between 10-11 Hemoglobin is 11.8 on admission, this is trended down to 8.8 with IV fluids Suspect some degree of hemodilution, may also have anemia of critical illness FOBT negative, and no reported visible blood loss Continue to monitor   DVT prophylaxis: SCD Code Status: full Family Communication: husband Disposition:   Status is: Inpatient Remains inpatient appropriate because: need for IV abx, w/u weakness  Consultants:   none  Procedures:  none  Antimicrobials:  Anti-infectives (From admission, onward)    Start     Dose/Rate Route Frequency Ordered Stop   04/09/21 0000  cefTRIAXone (ROCEPHIN) 2 g in sodium chloride 0.9 % 100 mL IVPB        2 g 200 mL/hr over 30 Minutes Intravenous Every 24 hours 04/08/21 2006     04/08/21 2200  vancomycin (VANCOREADY) IVPB 1250 mg/250 mL  Status:  Discontinued        1,250 mg 166.7 mL/hr over 90 Minutes Intravenous Every 48 hours 04/06/21 2040 04/06/21 2314   04/08/21 1430  ceFEPIme (MAXIPIME) 2 g in sodium chloride 0.9 % 100 mL IVPB  Status:  Discontinued        2 g 200 mL/hr over 30 Minutes Intravenous Every 12 hours 04/08/21 1336 04/08/21 2006   04/07/21 2100  ceFEPIme (MAXIPIME) 2 g in sodium chloride 0.9 % 100 mL IVPB  Status:  Discontinued        2 g 200 mL/hr over 30 Minutes Intravenous Every 24 hours 04/06/21 2040 04/08/21 1336   04/07/21 0800  metroNIDAZOLE (FLAGYL) IVPB 500 mg        500 mg 100 mL/hr over 60 Minutes Intravenous Every 12 hours 04/07/21 0024 04/13/21 1959   04/06/21 2315  metroNIDAZOLE (FLAGYL) IVPB 500 mg  Status:  Discontinued        500 mg 100 mL/hr over 60 Minutes Intravenous Every 12 hours 04/06/21 2314 04/07/21 0024   04/06/21 2200  vancomycin (VANCOREADY) IVPB 1500 mg/300 mL        1,500 mg 150 mL/hr over 120 Minutes Intravenous  Once 04/06/21 2036 04/07/21 0502   04/06/21 2030  ceFEPIme (MAXIPIME) 2 g in sodium chloride 0.9 % 100 mL IVPB        2 g 200 mL/hr over 30 Minutes Intravenous  Once 04/06/21 2017 04/06/21 2208   04/06/21 2030  metroNIDAZOLE (FLAGYL) IVPB 500 mg        500 mg 100 mL/hr over 60 Minutes Intravenous  Once 04/06/21 2017 04/06/21 2206   04/06/21 2030  vancomycin (VANCOCIN) IVPB 1000 mg/200 mL premix  Status:  Discontinued        1,000 mg 200 mL/hr over 60 Minutes Intravenous  Once 04/06/21 2017 04/06/21 2036       Subjective: C/o difficulty lifting legs off bed Generalized weakness since  admission  Objective: Vitals:   04/09/21 2113 04/10/21 0549 04/10/21 0807 04/10/21 1446  BP: (!) 155/62 (!) 165/65    Pulse: (!) 110 93    Resp:  20    Temp: 98.1 F (36.7 C) 98.6 F (37 C)    TempSrc:  Oral    SpO2: 93% 94% 94% 94%  Weight:      Height:        Intake/Output Summary (Last 24 hours) at 04/10/2021 1900 Last data filed at 04/10/2021 1700 Gross per 24 hour  Intake 900 ml  Output 1450 ml  Net -550 ml   Filed Weights   04/06/21 1851  Weight: 70.3 kg    Examination:  General: No acute distress. Cardiovascular: RRR Lungs: unlabored Abdomen: Soft, nontender, nondistended Neurological: Alert and oriented 3. Generalized weakness, symmetric, 4/5 strength to legs and upper extremities. Cranial nerves II through XII grossly intact. Skin: Warm and dry. No rashes or lesions. Extremities: No clubbing or cyanosis. No edema.    Data Reviewed: I have personally reviewed following labs and imaging studies  CBC: Recent  Labs  Lab 04/06/21 1910 04/07/21 0517 04/07/21 1915 04/08/21 0445 04/09/21 0839 04/10/21 0509  WBC 7.8 7.2 9.2 7.3 7.7 9.0  NEUTROABS 5.6 3.6  --   --   --   --   HGB 11.8* 8.8* 8.8* 8.2* 8.1* 8.3*  HCT 38.3 27.5* 26.9* 25.7* 26.1* 26.6*  MCV 95.5 97.5 95.4 98.1 97.0 95.7  PLT 296 259 300 253 256 303    Basic Metabolic Panel: Recent Labs  Lab 04/07/21 0517 04/07/21 1915 04/08/21 0445 04/09/21 0839 04/10/21 0509  NA 132* 127* 130* 133* 133*  K 4.3 4.6 4.5 4.4 3.8  CL 105 100 103 105 103  CO2 20* 19* 21* 17* 22  GLUCOSE 102* 96 98 82 115*  BUN 30* 26* 25* 18 14  CREATININE 1.50* 1.27* 1.27* 1.10* 0.99  CALCIUM 8.4* 8.2* 8.1* 8.0* 8.3*  MG 1.5*  --  2.6*  --   --     GFR: Estimated Creatinine Clearance: 40 mL/min (by C-G formula based on SCr of 0.99 mg/dL).  Liver Function Tests: Recent Labs  Lab 04/06/21 1910 04/07/21 0517 04/08/21 0445 04/09/21 0839 04/10/21 0509  AST 17 14* 17 13* 12*  ALT 14 10 10 11 10   ALKPHOS 62 40  37* 41 45  BILITOT 0.6 0.6 0.5 0.7 0.5  PROT 8.1 5.3* 5.4* 5.6* 6.0*  ALBUMIN 3.9 2.6* 2.3* 2.4* 2.5*    CBG: No results for input(s): GLUCAP in the last 168 hours.   Recent Results (from the past 240 hour(s))  Resp Panel by RT-PCR (Flu Aalayah Riles&B, Covid) Nasopharyngeal Swab     Status: None   Collection Time: 04/06/21  7:06 PM   Specimen: Nasopharyngeal Swab; Nasopharyngeal(NP) swabs in vial transport medium  Result Value Ref Range Status   SARS Coronavirus 2 by RT PCR NEGATIVE NEGATIVE Final    Comment: (NOTE) SARS-CoV-2 target nucleic acids are NOT DETECTED.  The SARS-CoV-2 RNA is generally detectable in upper respiratory specimens during the acute phase of infection. The lowest concentration of SARS-CoV-2 viral copies this assay can detect is 138 copies/mL. Lititia Sen negative result does not preclude SARS-Cov-2 infection and should not be used as the sole basis for treatment or other patient management decisions. Jaydeen Darley negative result may occur with  improper specimen collection/handling, submission of specimen other than nasopharyngeal swab, presence of viral mutation(s) within the areas targeted by this assay, and inadequate number of viral copies(<138 copies/mL). Lilia Letterman negative result must be combined with clinical observations, patient history, and epidemiological information. The expected result is Negative.  Fact Sheet for Patients:  BloggerCourse.com  Fact Sheet for Healthcare Providers:  SeriousBroker.it  This test is no t yet approved or cleared by the [place] FDA and  has been authorized for detection and/or diagnosis of SARS-CoV-2 by FDA under an Emergency Use Authorization (EUA). This EUA will remain  in effect (meaning this test can be used) for the duration of the COVID-19 declaration under Section 564(b)(1) of the Act, 21 U.S.C.section 360bbb-3(b)(1), unless the authorization is terminated  or revoked sooner.        Influenza Grete Bosko by PCR NEGATIVE NEGATIVE Final   Influenza B by PCR NEGATIVE NEGATIVE Final    Comment: (NOTE) The Xpert Xpress SARS-CoV-2/FLU/RSV plus assay is intended as an aid in the diagnosis of influenza from Nasopharyngeal swab specimens and should not be used as Lilyannah Zuelke sole basis for treatment. Nasal washings and aspirates are unacceptable for Xpert Xpress SARS-CoV-2/FLU/RSV testing.  Fact Sheet for Patients: BloggerCourse.com  Fact  Sheet for Healthcare Providers: SeriousBroker.it  This test is not yet approved or cleared by the [place] and has been authorized for detection and/or diagnosis of SARS-CoV-2 by FDA under an Emergency Use Authorization (EUA). This EUA will remain in effect (meaning this test can be used) for the duration of the COVID-19 declaration under Section 564(b)(1) of the Act, 21 U.S.C. section 360bbb-3(b)(1), unless the authorization is terminated or revoked.  Performed at [facility], [address], [place], [place] [address]   Blood Culture (routine x 2)     Status: None (Preliminary result)   Collection Time: 04/06/21  7:10 PM   Specimen: Left Antecubital; Blood  Result Value Ref Range Status   Specimen Description   Final    LEFT ANTECUBITAL BOTTLES DRAWN AEROBIC AND ANAEROBIC   Special Requests Blood Culture adequate volume  Final   Culture   Final    NO GROWTH 4 DAYS Performed at [facility], [address], [place], [place] [address]    Report Status PENDING  Incomplete  Blood Culture (routine x 2)     Status: None (Preliminary result)   Collection Time: 04/06/21  7:10 PM   Specimen: BLOOD LEFT HAND  Result Value Ref Range Status   Specimen Description   Final    BLOOD LEFT HAND BOTTLES DRAWN AEROBIC AND ANAEROBIC   Special Requests Blood Culture adequate volume  Final   Culture   Final    NO GROWTH 4 DAYS Performed at [facility], [address], [place], [place] [address]    Report  Status PENDING  Incomplete  Urine Culture     Status: None   Collection Time: 04/06/21  7:22 PM   Specimen: In/Out Cath Urine  Result Value Ref Range Status   Specimen Description   Final    IN/OUT CATH URINE Performed at [facility], [address], [place], [place] [address]    Special Requests   Final    NONE Performed at [facility], [address], [place], [place] [address]    Culture   Final    NO GROWTH Performed at [facility], [address] [place], [place] [address]    Report Status 04/09/2021 FINAL  Final  C Difficile Quick Screen w PCR reflex     Status: None   Collection Time: 04/08/21  6:13 PM   Specimen: STOOL  Result Value Ref Range Status   C Diff antigen NEGATIVE NEGATIVE Final   C Diff toxin NEGATIVE NEGATIVE Final   C Diff interpretation No C. difficile detected.  Final    Comment: Performed at [facility], [address], [place], [place] [address]         Radiology Studies: CT Angio Chest Pulmonary Embolism (PE) W or WO Contrast  Result Date: 04/10/2021 CLINICAL DATA:  Chest pain and shortness of breath. Cough and fever. EXAM: CT ANGIOGRAPHY CHEST WITH CONTRAST TECHNIQUE: Multidetector CT imaging of the chest was performed using the standard protocol during bolus administration of intravenous contrast. Multiplanar CT image reconstructions and MIPs were obtained to evaluate the vascular anatomy. RADIATION DOSE REDUCTION: This exam was performed according to the departmental dose-optimization program which includes automated exposure control, adjustment of the mA and/or kV according to patient size and/or use of iterative reconstruction technique. CONTRAST:  75mL OMNIPAQUE IOHEXOL 350 MG/ML SOLN COMPARISON:  Chest x-ray from yesterday. FINDINGS: Cardiovascular: The heart is upper limits of normal in size for age. No pericardial effusion. The aorta is normal  in caliber. Moderate scattered atherosclerotic calcifications. No aneurysm dissection. The branch  vessels are patent. Scattered coronary artery calcifications. The pulmonary arterial tree is fairly well opacified. No filling defects to suggest pulmonary embolism. Mediastinum/Nodes: Borderline mediastinal and hilar lymph nodes, likely reactive/inflammatory. The esophagus is grossly normal. Lungs/Pleura: Small bilateral pleural effusions with overlying atelectasis and patchy lower lobe infiltrates. Aspiration would be Deveon Kisiel consideration. No worrisome pulmonary lesions or pulmonary nodules. Upper Abdomen: Diffuse fatty infiltration of the liver. No hepatic lesions. Moderate aortic and branch vessel calcifications but no aneurysm or dissection. Musculoskeletal: No significant bony findings. Review of the MIP images confirms the above findings. IMPRESSION: 1. No CT findings for pulmonary embolism. 2. Normal caliber thoracic aorta without aneurysm or dissection. Moderate scattered atherosclerotic calcifications. 3. Small bilateral pleural effusions with overlying atelectasis and patchy lower lobe infiltrates. Aspiration would be Naida Escalante consideration. 4. Diffuse fatty infiltration of the liver. 5. Aortic atherosclerosis. Aortic Atherosclerosis (ICD10-I70.0). Electronically Signed   By: Rudie Meyer M.D.   On: 04/10/2021 14:57   MR LUMBAR SPINE WO CONTRAST  Result Date: 04/10/2021 CLINICAL DATA:  Bilateral lower extremity weakness, back pain EXAM: MRI LUMBAR SPINE WITHOUT CONTRAST TECHNIQUE: Multiplanar, multisequence MR imaging of the lumbar spine was performed. No intravenous contrast was administered. COMPARISON:  MRI lumbar spine dated September 24, 2020 FINDINGS: Segmentation:  Standard. Alignment:  Straightening of the lumbar spine Vertebrae:  No fracture, evidence of discitis, or bone lesion. Conus medullaris and cauda equina: Conus extends to the T12 level. Conus and cauda equina appear normal. Paraspinal and other soft tissues: Negative. Disc levels: T12-L1: Asymmetric disc protrusion to the right with right sub  articular disc space narrowing. No significant spinal canal or neural foraminal narrowing L1-L2: Disc protrusion in right subarticular disc extrusion which extends cranially approximately 1.5 cm, unchanged. Moderate ligamentum flavum hypertrophy with narrowing of spinal canal. Moderate to severe right and mild left neural foraminal narrowing, not significantly changed from prior examination. L2-L3: Disc space height loss with eccentric left disc protrusion. Effacement of the bilateral subarticular lateral recess. Facet joint arthropathy, left worse than the right. Moderate left and neural foraminal narrowing, appears to be worsened since prior examination. L3-L4: Disc height loss and broad-based disc protrusion. Narrowing of bilateral lateral recesses, left worse than the right. Ligamentum flavum hypertrophy with narrowing of spinal canal. No significant neural foraminal narrowing. L4-L5: Eccentric disc bulge to the right with mild left and moderate right subarticular recess narrowing. Ligamentum flavum hypertrophy. Moderate facet joint arthropathy. No significant neural foraminal narrowing. L5-S1: Right eccentric disc protrusion with narrowing of the right lateral recess. Moderate facet joint arthropathy. No significant neural foraminal narrowing. IMPRESSION: 1. Stable appearance of the L1-L2 disc protrusion and right cranial disc extrusion. Moderate to severe right and mild left neural foraminal narrowing. Subarticular recess narrowing with mass effect on bilateral L2 nerves, right worse than the left. 2. Moderate spinal canal stenosis at L2-L3 with effacement of the lateral recesses with mass effect on bilateral L3 nerve roots, left worse than the right and moderate left neural foraminal, the findings are worsened since prior examination. 3. Broad-based disc protrusion at L3-L4 with narrowing of bilateral lateral recesses and mass effect on L4 nerve roots bilaterally. No significant neural foraminal narrowing.  Findings are not significantly changed since prior examination. Electronically Signed   By: Larose Hires D.O.   On: 04/10/2021 16:07   DG CHEST PORT 1 VIEW  Result Date: 04/09/2021 CLINICAL DATA:  Wheezing. EXAM: PORTABLE CHEST 1 VIEW COMPARISON:  Chest x-ray 04/07/2021. FINDINGS: There is stable mild elevation of the right hemidiaphragm. Findings bibasilar atelectasis is similar to the prior study. There is no new lung consolidation, pleural effusion or pneumothorax identified. Cardiomediastinal silhouette is stable, the heart is mildly enlarged. No acute fractures are seen. IMPRESSION: 1. No acute cardiopulmonary process. Electronically Signed   By: Darliss Cheney M.D.   On: 04/09/2021 22:16        Scheduled Meds:  ipratropium-albuterol  3 mL Nebulization Q6H   pantoprazole  40 mg Oral Daily   simvastatin  40 mg Oral QHS   sodium bicarbonate  1,300 mg Oral BID   Continuous Infusions:  cefTRIAXone (ROCEPHIN)  IV 2 g (04/10/21 0009)   metronidazole 500 mg (04/10/21 0850)     LOS: 4 days    Time spent: over 30 min    Lacretia Nicks, MD [facility]   To contact the attending provider between 7A-7P or the covering provider during after hours 7P-7A, please log into the web site [email or web address] and access using universal [place] password for that web site. If you do not have the password, please call the hospital operator.  04/10/2021, 7:00 PM

## 2021-04-11 ENCOUNTER — Inpatient Hospital Stay (HOSPITAL_COMMUNITY): Payer: Medicare Other

## 2021-04-11 LAB — COMPREHENSIVE METABOLIC PANEL
ALT: 8 U/L (ref 0–44)
AST: 11 U/L — ABNORMAL LOW (ref 15–41)
Albumin: 2.4 g/dL — ABNORMAL LOW (ref 3.5–5.0)
Alkaline Phosphatase: 42 U/L (ref 38–126)
Anion gap: 8 (ref 5–15)
BUN: 9 mg/dL (ref 8–23)
CO2: 24 mmol/L (ref 22–32)
Calcium: 7.9 mg/dL — ABNORMAL LOW (ref 8.9–10.3)
Chloride: 102 mmol/L (ref 98–111)
Creatinine, Ser: 0.87 mg/dL (ref 0.44–1.00)
GFR, Estimated: >60 mL/min (ref >60)
Glucose, Bld: 119 mg/dL — ABNORMAL HIGH (ref 70–99)
Potassium: 3.8 mmol/L (ref 3.5–5.1)
Sodium: 134 mmol/L — ABNORMAL LOW (ref 135–145)
Total Bilirubin: 0.4 mg/dL (ref 0.3–1.2)
Total Protein: 5.5 g/dL — ABNORMAL LOW (ref 6.5–8.1)

## 2021-04-11 LAB — CBC WITH DIFFERENTIAL/PLATELET
Band Neutrophils: 4 %
Basophils Absolute: 0 10*3/uL (ref 0.0–0.1)
Basophils Relative: 0 %
Eosinophils Absolute: 0.2 10*3/uL (ref 0.0–0.5)
Eosinophils Relative: 2 %
HCT: 25.3 % — ABNORMAL LOW (ref 36.0–46.0)
Hemoglobin: 8 g/dL — ABNORMAL LOW (ref 12.0–15.0)
Lymphocytes Relative: 23 %
Lymphs Abs: 2.1 10*3/uL (ref 0.7–4.0)
MCH: 30.4 pg (ref 26.0–34.0)
MCHC: 31.6 g/dL (ref 30.0–36.0)
MCV: 96.2 fL (ref 80.0–100.0)
Metamyelocytes Relative: 6 %
Monocytes Absolute: 1 10*3/uL (ref 0.1–1.0)
Monocytes Relative: 11 %
Myelocytes: 1 %
Neutro Abs: 5.2 10*3/uL (ref 1.7–7.7)
Neutrophils Relative %: 53 %
Platelets: 289 10*3/uL (ref 150–400)
RBC: 2.63 MIL/uL — ABNORMAL LOW (ref 3.87–5.11)
RDW: 14.9 % (ref 11.5–15.5)
WBC: 9.2 10*3/uL (ref 4.0–10.5)
nRBC: 0.2 % (ref 0.0–0.2)

## 2021-04-11 LAB — CULTURE, BLOOD (ROUTINE X 2)
Culture: NO GROWTH
Culture: NO GROWTH
Special Requests: ADEQUATE
Special Requests: ADEQUATE

## 2021-04-11 LAB — MAGNESIUM: Magnesium: 1.9 mg/dL (ref 1.7–2.4)

## 2021-04-11 LAB — PHOSPHORUS: Phosphorus: 2.2 mg/dL — ABNORMAL LOW (ref 2.5–4.6)

## 2021-04-11 MED ORDER — GUAIFENESIN-DM 100-10 MG/5ML PO SYRP
5.0000 mL | ORAL_SOLUTION | ORAL | Status: DC | PRN
  Administered 2021-04-11 – 2021-04-14 (×12): 5 mL via ORAL
  Filled 2021-04-11 (×12): qty 5

## 2021-04-11 NOTE — Evaluation (Signed)
Clinical/Bedside Swallow Evaluation ?Patient Details  ?Name: Diane Torres ?MRN: [id number] ?Date of Birth: 02/05/1940 ? ?Today's Date: 04/11/2021 ?Time: SLP Start Time (ACUTE ONLY): 1542 SLP Stop Time (ACUTE ONLY): 1608 ?SLP Time Calculation (min) (ACUTE ONLY): 26 min ? ?Past Medical History:  ?Past Medical History:  ?Diagnosis Date  ? CAP (community acquired pneumonia) 05/2018  ? Diverticulitis   ? CT verified in 2015, 2018, 2019, 2020  ? GERD (gastroesophageal reflux disease)   ? Hypothyroidism   ? Impaired fasting glucose   ? Mixed hyperlipidemia   ? ?Past Surgical History:  ?Past Surgical History:  ?Procedure Laterality Date  ? COLONOSCOPY    ? COLONOSCOPY N/A 12/12/2015  ? Dr. Jena Gauss: one 3 mm tubular adenoma in rectum s/p removal. Diverticulosis in entire colon. Internal hemorrhoids.   ? ESOPHAGOGASTRODUODENOSCOPY N/A 03/19/2016  ? Dr. Jena Gauss: LA Grade A esophagitis, empiric dilation, normal stomach, normal duodenum  ? Left lobe thyroidectomy    ? MALONEY DILATION N/A 03/19/2016  ? Procedure: MALONEY DILATION;  Surgeon: Corbin Ade, MD;  Location: [facility];  Service: Endoscopy;  Laterality: N/A;  ? POLYPECTOMY  12/12/2015  ? Procedure: POLYPECTOMY;  Surgeon: Corbin Ade, MD;  Location: [facility];  Service: Endoscopy;;  colon  ? RECTOCELE REPAIR    ? TOTAL ABDOMINAL HYSTERECTOMY W/ BILATERAL SALPINGOOPHORECTOMY    ? ?HPI:  ?81 year old female admitted to the hospital with abdominal pain, frequent stools, dehydration.  Found to have diverticulitis with acute kidney injury.  She was also having vomiting and subsequently developed hypoxia.  There was concern for possible aspiration.  She was treated with IV fluids and antibiotics.  Overall renal function is improving.  Diet is being advanced.  Diverticulitis appears to be improving with antibiotics.  Seen by physical therapy with recommendations for skilled nursing facility placement. CT PT protocol with small bilateral pleural effusions with overlying  atelectasiss and patchy lower lobe infiltrates, concern for aspiration  On appropriate abx. BSE requested.  ?  ?Assessment / Plan / Recommendation  ?Clinical Impression ? Clinical swallow evaluation completed at bedside with spouse present. Pt denies difficulty swallowing, but does endorse emesis a couple of days ago. Oral motor examination is WNL. Pt assessed with ice chips, thin liquids via cup/straw, puree, and regular textures. Pt without overt signs or symptoms of aspiration and no reports of globus. Risk for aspiration at this time appears low. Recommend regular textures (Pt on soft currently for diverticulitis?) and thin liquids with po medications whole with water with standard aspiration and reflux precautions. No further SLP services indicated at this time. SLP will sign off. ?SLP Visit Diagnosis: Dysphagia, unspecified (R13.10) ?   ?Aspiration Risk ? Mild aspiration risk  ?  ?Diet Recommendation Regular;Thin liquid  ? ?Liquid Administration via: Cup;Straw ?Medication Administration: Whole meds with liquid ?Supervision: Patient able to self feed ?Postural Changes: Seated upright at 90 degrees;Remain upright for at least 30 minutes after po intake  ?  ?Other  Recommendations Oral Care Recommendations: Oral care BID ?Other Recommendations: Clarify dietary restrictions   ? ?Recommendations for follow up therapy are one component of a multi-disciplinary discharge planning process, led by the attending physician.  Recommendations may be updated based on patient status, additional functional criteria and insurance authorization. ? ?Follow up Recommendations No SLP follow up  ? ? ?  ?Assistance Recommended at Discharge None  ?Functional Status Assessment Patient has not had a recent decline in their functional status  ?Frequency and Duration    ?  ?  ?   ? ?  Prognosis    ? ?  ? ?Swallow Study   ?General Date of Onset: 04/06/21 ?HPI: 81 year old female admitted to the hospital with abdominal pain, frequent stools,  dehydration.  Found to have diverticulitis with acute kidney injury.  She was also having vomiting and subsequently developed hypoxia.  There was concern for possible aspiration.  She was treated with IV fluids and antibiotics.  Overall renal function is improving.  Diet is being advanced.  Diverticulitis appears to be improving with antibiotics.  Seen by physical therapy with recommendations for skilled nursing facility placement. CT PT protocol with small bilateral pleural effusions with overlying atelectasiss and patchy lower lobe infiltrates, concern for aspiration  On appropriate abx. BSE requested. ?Type of Study: Bedside Swallow Evaluation ?Previous Swallow Assessment: n/a ?Diet Prior to this Study: Dysphagia 3 (soft);Thin liquids ?Temperature Spikes Noted: No ?Respiratory Status: Nasal cannula ?History of Recent Intubation: No ?Behavior/Cognition: Alert;Cooperative;Pleasant mood ?Oral Cavity Assessment: Within Functional Limits ?Oral Care Completed by SLP: No ?Oral Cavity - Dentition: Adequate natural dentition ?Vision: Functional for self-feeding ?Self-Feeding Abilities: Able to feed self ?Patient Positioning: Upright in bed ?Baseline Vocal Quality: Normal ?Volitional Cough: Strong ?Volitional Swallow: Able to elicit  ?  ?Oral/Motor/Sensory Function Overall Oral Motor/Sensory Function: Within functional limits   ?Ice Chips Ice chips: Within functional limits ?Presentation: Spoon   ?Thin Liquid Thin Liquid: Within functional limits ?Presentation: Cup;Self Fed;Straw  ?  ?Nectar Thick Nectar Thick Liquid: Not tested   ?Honey Thick Honey Thick Liquid: Not tested   ?Puree Puree: Within functional limits ?Presentation: Spoon;Self Fed   ?Solid ? ? ?  Solid: Within functional limits ?Presentation: Self Fed  ? ?  ?Thank you, ? ?Havery Moros, CCC-SLP ?[phone] ? ?Lailah Marcelli ?04/11/2021,4:18 PM ? ? ? ? ?

## 2021-04-11 NOTE — Progress Notes (Signed)
PROGRESS NOTE    KRYS FUSSELMAN  VFI:[id number] DOB: Jun 27, 1940 DOA: 04/06/2021 PCP: Benita Stabile, MD  Chief Complaint  Patient presents with   Fever    Brief Narrative:  81 year old female admitted to the hospital with abdominal pain, frequent stools, dehydration.  Found to have diverticulitis with acute kidney injury.  She was also having vomiting and subsequently developed hypoxia.  There was concern for possible aspiration.  She was treated with IV fluids and antibiotics.  Overall renal function is improving.  Diet is being advanced.  Diverticulitis appears to be improving with antibiotics.  Seen by physical therapy with recommendations for skilled nursing facility placement    Assessment & Plan:   Principal Problem:   Diverticulitis Active Problems:   Sepsis (HCC)   Generalized weakness   Acute respiratory failure with hypoxia (HCC)   CKD (chronic kidney disease) stage 3, GFR 30-59 ml/min (HCC)   AKI (acute kidney injury) (HCC)   Metabolic acidosis, normal anion gap (NAG)   Hyponatremia   Renal cyst, right   Mixed hyperlipidemia   GERD (gastroesophageal reflux disease)   Essential hypertension   Hyperkalemia   Normocytic anemia   Assessment and Plan: * Diverticulitis CT with mild diverticulitis involving the ascending colon Currently on IV antibiotics (3/5-present) Will follow  Sepsis (HCC) On admission: HR 106, R 21, Temp 103.2, AKI, acute respiratory failure with hypoxia Diverticulitis Continuing abx as noted above Sepsis physiology resolved  Generalized weakness Physical therapy evaluation with recommendations for skilled nursing facility placement She complaining of generalized weakness since her admission, but today specifically mentions lower extremity weakness and difficulty lifting legs off bed MRI lumbar spine with stable L1-2 disc protrusion and right cranial disc extrusion, moderate to severe right and mild L neural foraminal narrowing, subarticular  recess narrowing with mass effect on bilateral L2 nervs, R>L - moderate spinal canal stenosis at L2-3 with effacement of lateral recesses with mass effect on bilateral L3 nerve roots, L>R and moderate L foraminal - broad based disc protrusion at L3-4 with narrowing of bilateral recesses and mass effect on L4 nerve roots bilaterally Discussed lumbar spine findings with nsgy yesterday, no surgical indication at this point based on discussion She continues to complain of weakness, both upper and lower extremities - I suspect this is due to her systemic illness, but will follow MRI C and T spine for completion  Acute respiratory failure with hypoxia (HCC) CT PT protocol with small bilateral pleural effusions with overlying atelectasiss and patchy lower lobe infiltrates, concern for aspiration On appropriate abx Wean o2 as able SLP eval  Hyponatremia Mild, continue to monitor  Metabolic acidosis, normal anion gap (NAG) Likely related to GI losses Start oral bicarbonate replacement  AKI (acute kidney injury) (HCC) Renal function improved at this time  CKD (chronic kidney disease) stage 3, GFR 30-59 ml/min (HCC) CKD stage 3A Baseline creatinine approx 1.0  Renal cyst, right Incidental finding on CT abdomen Renal ultrasound did not definitively visualize cyst Can consider follow-up renal cyst protocol CTx  Mixed hyperlipidemia Continue statin  GERD (gastroesophageal reflux disease) Continue Protonix  Essential hypertension Blood pressures have been soft, holding losartan  Hyperkalemia 5.7, likely secondary to AKI Resolved after receiving IV fluids  Normocytic anemia Baseline hemoglobin between 10-11 Hemoglobin is 11.8 on admission, this is trended down to 8.8 with IV fluids Suspect some degree of hemodilution, may also have anemia of critical illness FOBT negative, and no reported visible blood loss Continue to monitor   DVT  prophylaxis: SCD Code Status: full Family  Communication: husband Disposition:   Status is: Inpatient Remains inpatient appropriate because: need for IV abx, w/u weakness   Consultants:  none  Procedures:  none  Antimicrobials:  Anti-infectives (From admission, onward)    Start     Dose/Rate Route Frequency Ordered Stop   04/09/21 0000  cefTRIAXone (ROCEPHIN) 2 g in sodium chloride 0.9 % 100 mL IVPB        2 g 200 mL/hr over 30 Minutes Intravenous Every 24 hours 04/08/21 2006     04/08/21 2200  vancomycin (VANCOREADY) IVPB 1250 mg/250 mL  Status:  Discontinued        1,250 mg 166.7 mL/hr over 90 Minutes Intravenous Every 48 hours 04/06/21 2040 04/06/21 2314   04/08/21 1430  ceFEPIme (MAXIPIME) 2 g in sodium chloride 0.9 % 100 mL IVPB  Status:  Discontinued        2 g 200 mL/hr over 30 Minutes Intravenous Every 12 hours 04/08/21 1336 04/08/21 2006   04/07/21 2100  ceFEPIme (MAXIPIME) 2 g in sodium chloride 0.9 % 100 mL IVPB  Status:  Discontinued        2 g 200 mL/hr over 30 Minutes Intravenous Every 24 hours 04/06/21 2040 04/08/21 1336   04/07/21 0800  metroNIDAZOLE (FLAGYL) IVPB 500 mg        500 mg 100 mL/hr over 60 Minutes Intravenous Every 12 hours 04/07/21 0024 04/13/21 1959   04/06/21 2315  metroNIDAZOLE (FLAGYL) IVPB 500 mg  Status:  Discontinued        500 mg 100 mL/hr over 60 Minutes Intravenous Every 12 hours 04/06/21 2314 04/07/21 0024   04/06/21 2200  vancomycin (VANCOREADY) IVPB 1500 mg/300 mL        1,500 mg 150 mL/hr over 120 Minutes Intravenous  Once 04/06/21 2036 04/07/21 0502   04/06/21 2030  ceFEPIme (MAXIPIME) 2 g in sodium chloride 0.9 % 100 mL IVPB        2 g 200 mL/hr over 30 Minutes Intravenous  Once 04/06/21 2017 04/06/21 2208   04/06/21 2030  metroNIDAZOLE (FLAGYL) IVPB 500 mg        500 mg 100 mL/hr over 60 Minutes Intravenous  Once 04/06/21 2017 04/06/21 2206   04/06/21 2030  vancomycin (VANCOCIN) IVPB 1000 mg/200 mL premix  Status:  Discontinued        1,000 mg 200 mL/hr over 60  Minutes Intravenous  Once 04/06/21 2017 04/06/21 2036       Subjective: Continues to c/o weakness Also notes c/o cough   Objective: Vitals:   04/10/21 1921 04/10/21 2026 04/11/21 0525 04/11/21 0759  BP: (!) 148/54  (!) 157/62   Pulse: 93  93   Resp: 16  19   Temp: 98.2 F (36.8 C)  98.5 F (36.9 C)   TempSrc:      SpO2: 97% 91% 93% 92%  Weight:      Height:        Intake/Output Summary (Last 24 hours) at 04/11/2021 1353 Last data filed at 04/11/2021 0900 Gross per 24 hour  Intake 480 ml  Output 3150 ml  Net -2670 ml   Filed Weights   04/06/21 1851  Weight: 70.3 kg    Examination:  General: No acute distress. Cardiovascular: RRR Lungs: unlabored Abdomen: Soft, nontender, nondistended with normal active bowel sounds. No masses. No hepatosplenomegaly. Neurological: bilateral LE weakness, 4/5 strength, 4/5 strength to upper extremities with breakaway weakness - denies sensory deficits to light touch  Skin: Warm and dry. No rashes or lesions. Extremities: No clubbing or cyanosis. No edema.   Data Reviewed: I have personally reviewed following labs and imaging studies  CBC: Recent Labs  Lab 04/06/21 1910 04/07/21 0517 04/07/21 1915 04/08/21 0445 04/09/21 0839 04/10/21 0509 04/11/21 0518  WBC 7.8 7.2 9.2 7.3 7.7 9.0 9.2  NEUTROABS 5.6 3.6  --   --   --   --  5.2  HGB 11.8* 8.8* 8.8* 8.2* 8.1* 8.3* 8.0*  HCT 38.3 27.5* 26.9* 25.7* 26.1* 26.6* 25.3*  MCV 95.5 97.5 95.4 98.1 97.0 95.7 96.2  PLT 296 259 300 253 256 303 289    Basic Metabolic Panel: Recent Labs  Lab 04/07/21 0517 04/07/21 1915 04/08/21 0445 04/09/21 0839 04/10/21 0509 04/11/21 0518  NA 132* 127* 130* 133* 133* 134*  K 4.3 4.6 4.5 4.4 3.8 3.8  CL 105 100 103 105 103 102  CO2 20* 19* 21* 17* 22 24  GLUCOSE 102* 96 98 82 115* 119*  BUN 30* 26* 25* 18 14 9   CREATININE 1.50* 1.27* 1.27* 1.10* 0.99 0.87  CALCIUM 8.4* 8.2* 8.1* 8.0* 8.3* 7.9*  MG 1.5*  --  2.6*  --   --  1.9  PHOS  --   --    --   --   --  2.2*    GFR: Estimated Creatinine Clearance: 45.5 mL/min (by C-G formula based on SCr of 0.87 mg/dL).  Liver Function Tests: Recent Labs  Lab 04/07/21 0517 04/08/21 0445 04/09/21 0839 04/10/21 0509 04/11/21 0518  AST 14* 17 13* 12* 11*  ALT 10 10 11 10 8   ALKPHOS 40 37* 41 45 42  BILITOT 0.6 0.5 0.7 0.5 0.4  PROT 5.3* 5.4* 5.6* 6.0* 5.5*  ALBUMIN 2.6* 2.3* 2.4* 2.5* 2.4*    CBG: No results for input(s): GLUCAP in the last 168 hours.   Recent Results (from the past 240 hour(s))  Resp Panel by RT-PCR (Flu Nikki Glanzer&B, Covid) Nasopharyngeal Swab     Status: None   Collection Time: 04/06/21  7:06 PM   Specimen: Nasopharyngeal Swab; Nasopharyngeal(NP) swabs in vial transport medium  Result Value Ref Range Status   SARS Coronavirus 2 by RT PCR NEGATIVE NEGATIVE Final    Comment: (NOTE) SARS-CoV-2 target nucleic acids are NOT DETECTED.  The SARS-CoV-2 RNA is generally detectable in upper respiratory specimens during the acute phase of infection. The lowest concentration of SARS-CoV-2 viral copies this assay can detect is 138 copies/mL. Lynessa Almanzar negative result does not preclude SARS-Cov-2 infection and should not be used as the sole basis for treatment or other patient management decisions. Gonzalo Waymire negative result may occur with  improper specimen collection/handling, submission of specimen other than nasopharyngeal swab, presence of viral mutation(s) within the areas targeted by this assay, and inadequate number of viral copies(<138 copies/mL). Daley Gosse negative result must be combined with clinical observations, patient history, and epidemiological information. The expected result is Negative.  Fact Sheet for Patients:  BloggerCourse.com  Fact Sheet for Healthcare Providers:  SeriousBroker.it  This test is no t yet approved or cleared by the [place] FDA and  has been authorized for detection and/or diagnosis of SARS-CoV-2  by FDA under an Emergency Use Authorization (EUA). This EUA will remain  in effect (meaning this test can be used) for the duration of the COVID-19 declaration under Section 564(b)(1) of the Act, 21 U.S.C.section 360bbb-3(b)(1), unless the authorization is terminated  or revoked sooner.       Influenza Johnluke Haugen by  PCR NEGATIVE NEGATIVE Final   Influenza B by PCR NEGATIVE NEGATIVE Final    Comment: (NOTE) The Xpert Xpress SARS-CoV-2/FLU/RSV plus assay is intended as an aid in the diagnosis of influenza from Nasopharyngeal swab specimens and should not be used as [facility] sole basis for treatment. Nasal washings and aspirates are unacceptable for Xpert Xpress SARS-CoV-2/FLU/RSV testing.  Fact Sheet for Patients: BloggerCourse.com  Fact Sheet for Healthcare Providers: SeriousBroker.it  This test is not yet approved or cleared by the [place] FDA and has been authorized for detection and/or diagnosis of SARS-CoV-2 by FDA under an Emergency Use Authorization (EUA). This EUA will remain in effect (meaning this test can be used) for the duration of the COVID-19 declaration under Section 564(b)(1) of the Act, 21 U.S.C. section 360bbb-3(b)(1), unless the authorization is terminated or revoked.  Performed at [facility], [address], [place], [place] [address]   Blood Culture (routine x 2)     Status: None   Collection Time: 04/06/21  7:10 PM   Specimen: Left Antecubital; Blood  Result Value Ref Range Status   Specimen Description   Final    LEFT ANTECUBITAL BOTTLES DRAWN AEROBIC AND ANAEROBIC   Special Requests Blood Culture adequate volume  Final   Culture   Final    NO GROWTH 5 DAYS Performed at [facility], [address], [place], [place] [address]    Report Status 04/11/2021 FINAL  Final  Blood Culture (routine x 2)     Status: None   Collection Time: 04/06/21  7:10 PM   Specimen: BLOOD LEFT HAND  Result Value Ref Range Status    Specimen Description   Final    BLOOD LEFT HAND BOTTLES DRAWN AEROBIC AND ANAEROBIC   Special Requests Blood Culture adequate volume  Final   Culture   Final    NO GROWTH 5 DAYS Performed at [facility], [address] [place], [place] [address]    Report Status 04/11/2021 FINAL  Final  Urine Culture     Status: None   Collection Time: 04/06/21  7:22 PM   Specimen: In/Out Cath Urine  Result Value Ref Range Status   Specimen Description   Final    IN/OUT CATH URINE Performed at [facility], [address], [place], [place] [address]    Special Requests   Final    NONE Performed at [facility], [address], [place], [place] [address]    Culture   Final    NO GROWTH Performed at [facility], [address], [place], [place] [address]    Report Status 04/09/2021 FINAL  Final  C Difficile Quick Screen w PCR reflex     Status: None   Collection Time: 04/08/21  6:13 PM   Specimen: STOOL  Result Value Ref Range Status   C Diff antigen NEGATIVE NEGATIVE Final   C Diff toxin NEGATIVE NEGATIVE Final   C Diff interpretation No C. difficile detected.  Final    Comment: Performed at [facility], [address], [place], [place] [address]         Radiology Studies: CT Angio Chest Pulmonary Embolism (PE) W or WO Contrast  Result Date: 04/10/2021 CLINICAL DATA:  Chest pain and shortness of breath. Cough and fever. EXAM: CT ANGIOGRAPHY CHEST WITH CONTRAST TECHNIQUE: Multidetector CT imaging of the chest was performed using the standard protocol during bolus administration of intravenous contrast. Multiplanar CT image reconstructions and MIPs were obtained to evaluate the vascular anatomy. RADIATION DOSE  REDUCTION: This exam was performed according to the departmental dose-optimization program which includes automated exposure control, adjustment of the mA and/or kV according to patient size and/or use of iterative reconstruction technique. CONTRAST:  75mL OMNIPAQUE IOHEXOL 350 MG/ML  SOLN COMPARISON:  Chest x-ray from yesterday. FINDINGS: Cardiovascular: The heart is upper limits of normal in size for age. No pericardial effusion. The aorta is normal in caliber. Moderate scattered atherosclerotic calcifications. No aneurysm dissection. The branch vessels are patent. Scattered coronary artery calcifications. The pulmonary arterial tree is fairly well opacified. No filling defects to suggest pulmonary embolism. Mediastinum/Nodes: Borderline mediastinal and hilar lymph nodes, likely reactive/inflammatory. The esophagus is grossly normal. Lungs/Pleura: Small bilateral pleural effusions with overlying atelectasis and patchy lower lobe infiltrates. Aspiration would be Wesly Whisenant consideration. No worrisome pulmonary lesions or pulmonary nodules. Upper Abdomen: Diffuse fatty infiltration of the liver. No hepatic lesions. Moderate aortic and branch vessel calcifications but no aneurysm or dissection. Musculoskeletal: No significant bony findings. Review of the MIP images confirms the above findings. IMPRESSION: 1. No CT findings for pulmonary embolism. 2. Normal caliber thoracic aorta without aneurysm or dissection. Moderate scattered atherosclerotic calcifications. 3. Small bilateral pleural effusions with overlying atelectasis and patchy lower lobe infiltrates. Aspiration would be Javae Braaten consideration. 4. Diffuse fatty infiltration of the liver. 5. Aortic atherosclerosis. Aortic Atherosclerosis (ICD10-I70.0). Electronically Signed   By: Rudie Meyer M.D.   On: 04/10/2021 14:57   MR LUMBAR SPINE WO CONTRAST  Result Date: 04/10/2021 CLINICAL DATA:  Bilateral lower extremity weakness, back pain EXAM: MRI LUMBAR SPINE WITHOUT CONTRAST TECHNIQUE: Multiplanar, multisequence MR imaging of the lumbar spine was performed. No intravenous contrast was administered. COMPARISON:  MRI lumbar spine dated September 24, 2020 FINDINGS: Segmentation:  Standard. Alignment:  Straightening of the lumbar spine Vertebrae:  No fracture,  evidence of discitis, or bone lesion. Conus medullaris and cauda equina: Conus extends to the T12 level. Conus and cauda equina appear normal. Paraspinal and other soft tissues: Negative. Disc levels: T12-L1: Asymmetric disc protrusion to the right with right sub articular disc space narrowing. No significant spinal canal or neural foraminal narrowing L1-L2: Disc protrusion in right subarticular disc extrusion which extends cranially approximately 1.5 cm, unchanged. Moderate ligamentum flavum hypertrophy with narrowing of spinal canal. Moderate to severe right and mild left neural foraminal narrowing, not significantly changed from prior examination. L2-L3: Disc space height loss with eccentric left disc protrusion. Effacement of the bilateral subarticular lateral recess. Facet joint arthropathy, left worse than the right. Moderate left and neural foraminal narrowing, appears to be worsened since prior examination. L3-L4: Disc height loss and broad-based disc protrusion. Narrowing of bilateral lateral recesses, left worse than the right. Ligamentum flavum hypertrophy with narrowing of spinal canal. No significant neural foraminal narrowing. L4-L5: Eccentric disc bulge to the right with mild left and moderate right subarticular recess narrowing. Ligamentum flavum hypertrophy. Moderate facet joint arthropathy. No significant neural foraminal narrowing. L5-S1: Right eccentric disc protrusion with narrowing of the right lateral recess. Moderate facet joint arthropathy. No significant neural foraminal narrowing. IMPRESSION: 1. Stable appearance of the L1-L2 disc protrusion and right cranial disc extrusion. Moderate to severe right and mild left neural foraminal narrowing. Subarticular recess narrowing with mass effect on bilateral L2 nerves, right worse than the left. 2. Moderate spinal canal stenosis at L2-L3 with effacement of the lateral recesses with mass effect on bilateral L3 nerve roots, left worse than the right  and moderate left neural foraminal, the findings are worsened since prior examination. 3. Broad-based disc  protrusion at L3-L4 with narrowing of bilateral lateral recesses and mass effect on L4 nerve roots bilaterally. No significant neural foraminal narrowing. Findings are not significantly changed since prior examination. Electronically Signed   By: Larose Hires D.O.   On: 04/10/2021 16:07   DG CHEST PORT 1 VIEW  Result Date: 04/09/2021 CLINICAL DATA:  Wheezing. EXAM: PORTABLE CHEST 1 VIEW COMPARISON:  Chest x-ray 04/07/2021. FINDINGS: There is stable mild elevation of the right hemidiaphragm. Findings bibasilar atelectasis is similar to the prior study. There is no new lung consolidation, pleural effusion or pneumothorax identified. Cardiomediastinal silhouette is stable, the heart is mildly enlarged. No acute fractures are seen. IMPRESSION: 1. No acute cardiopulmonary process. Electronically Signed   By: Darliss Cheney M.D.   On: 04/09/2021 22:16        Scheduled Meds:  ipratropium-albuterol  3 mL Nebulization TID   pantoprazole  40 mg Oral Daily   simvastatin  40 mg Oral QHS   sodium bicarbonate  1,300 mg Oral BID   Continuous Infusions:  cefTRIAXone (ROCEPHIN)  IV 2 g (04/11/21 0024)   metronidazole 500 mg (04/11/21 0734)     LOS: 5 days    Time spent: over 30 min    Lacretia Nicks, MD [facility]   To contact the attending provider between 7A-7P or the covering provider during after hours 7P-7A, please log into the web site [email or web address] and access using universal [place] password for that web site. If you do not have the password, please call the hospital operator.  04/11/2021, 1:53 PM

## 2021-04-12 ENCOUNTER — Inpatient Hospital Stay (HOSPITAL_COMMUNITY): Payer: Medicare Other

## 2021-04-12 LAB — CBC WITH DIFFERENTIAL/PLATELET
Band Neutrophils: 4 %
Basophils Absolute: 0 10*3/uL (ref 0.0–0.1)
Basophils Relative: 0 %
Eosinophils Absolute: 0.4 10*3/uL (ref 0.0–0.5)
Eosinophils Relative: 4 %
HCT: 26.6 % — ABNORMAL LOW (ref 36.0–46.0)
Hemoglobin: 8.3 g/dL — ABNORMAL LOW (ref 12.0–15.0)
Lymphocytes Relative: 20 %
Lymphs Abs: 2 10*3/uL (ref 0.7–4.0)
MCH: 29.9 pg (ref 26.0–34.0)
MCHC: 31.2 g/dL (ref 30.0–36.0)
MCV: 95.7 fL (ref 80.0–100.0)
Metamyelocytes Relative: 7 %
Monocytes Absolute: 0.8 10*3/uL (ref 0.1–1.0)
Monocytes Relative: 8 %
Myelocytes: 1 %
Neutro Abs: 5.9 10*3/uL (ref 1.7–7.7)
Neutrophils Relative %: 56 %
Platelets: 301 10*3/uL (ref 150–400)
RBC: 2.78 MIL/uL — ABNORMAL LOW (ref 3.87–5.11)
RDW: 14.6 % (ref 11.5–15.5)
WBC: 9.9 10*3/uL (ref 4.0–10.5)
nRBC: 0.3 % — ABNORMAL HIGH (ref 0.0–0.2)

## 2021-04-12 LAB — TSH: TSH: 3.532 u[IU]/mL (ref 0.350–4.500)

## 2021-04-12 LAB — COMPREHENSIVE METABOLIC PANEL
ALT: 8 U/L (ref 0–44)
AST: 10 U/L — ABNORMAL LOW (ref 15–41)
Albumin: 2.4 g/dL — ABNORMAL LOW (ref 3.5–5.0)
Alkaline Phosphatase: 40 U/L (ref 38–126)
Anion gap: 8 (ref 5–15)
BUN: 9 mg/dL (ref 8–23)
CO2: 24 mmol/L (ref 22–32)
Calcium: 7.8 mg/dL — ABNORMAL LOW (ref 8.9–10.3)
Chloride: 101 mmol/L (ref 98–111)
Creatinine, Ser: 0.95 mg/dL (ref 0.44–1.00)
GFR, Estimated: >60 mL/min (ref >60)
Glucose, Bld: 109 mg/dL — ABNORMAL HIGH (ref 70–99)
Potassium: 3.6 mmol/L (ref 3.5–5.1)
Sodium: 133 mmol/L — ABNORMAL LOW (ref 135–145)
Total Bilirubin: 0.3 mg/dL (ref 0.3–1.2)
Total Protein: 5.6 g/dL — ABNORMAL LOW (ref 6.5–8.1)

## 2021-04-12 LAB — MAGNESIUM: Magnesium: 1.9 mg/dL (ref 1.7–2.4)

## 2021-04-12 LAB — PHOSPHORUS: Phosphorus: 2.8 mg/dL (ref 2.5–4.6)

## 2021-04-12 LAB — CK: Total CK: 262 U/L — ABNORMAL HIGH (ref 38–234)

## 2021-04-12 NOTE — TOC Progression Note (Addendum)
Transition of Care (TOC) - Progression Note  ? ? ?Patient Details  ?Name: Diane Torres ?MRN: [id number] ?Date of Birth: 1941-01-23 ? ?Transition of Care (TOC) CM/SW Contact  ?Chelsy Parrales D, LCSW ?Phone Number: ?04/12/2021, 2:46 PM ? ?Clinical Narrative:    ?Auth expires today. Patient not medically ready. Auth started again. Updated PT notes are in.  ? ? ?Expected Discharge Plan: [place] ?Barriers to Discharge: Continued Medical Work up ? ?Expected Discharge Plan and Services ?Expected Discharge Plan: [place] ?In-house Referral: Clinical Social Work ?  ?Post Acute Care Choice: [place] ?Living arrangements for the past 2 months: [place] ?                ?  ?  ?  ?  ?  ?  ?  ?  ?  ?  ? ? ?Social Determinants of Health (SDOH) Interventions ?  ? ?Readmission Risk Interventions ?No flowsheet data found. ? ?

## 2021-04-12 NOTE — Progress Notes (Signed)
Physical Therapy Treatment ?Patient Details ?Name: Diane Torres ?MRN: [id number] ?DOB: 01-10-1941 ?Today's Date: 04/12/2021 ? ? ?History of Present Illness Diane Torres is a 81 y.o. female with medical history significant of with history significant for GERD, hypertension, mixed hyperlipidemia, diverticulitis, presents ED with a chief complaint of fever.  Patient reports that she has had a decreased appetite and decreased p.o. intake for several days.  Her daughter reports that she was in her normal good spirits this morning when they were running errands.  She laid down for a nap and when she woke up she had chills, fever, vomiting.  Daughter confirms that patient had not eaten the whole day.  They report several episodes of vomiting yellow liquid.  No blood.  Patient denies any associated belly pain.  She does not remember when her last normal bowel movement was, but reports that she has had diarrhea for 2 nights in a row.  She does not know when her last normal meal was either, but thinks its been several days.  She has noticed a darkening color to her urine, but no blood.  No dysuria.  Patient has no further complaints at this time. ? ?  ?PT Comments  ? ? Pt friendly and willing to participate with therapy.  Pt limited by BLE weakness and fatigue.  Pt mod I with bed mobility, increased time and labored movements.  Cueing for handplacement to assist with sit to stand safely.  Pt with need for restroom break, ambulated with RW slow labored cadence twice.  Pt standing at sink washing hands with LOB due to fatigue, required mod A for safety.  During this time IV came out of arm with blood on floor, RN notified to assist.   Following second trip to restroom pt requested to return to bed due to fatigue.  Encouraged pt to sit up as much as possible to assist with strength, verbalized understanding.  Pt left in chair with call bell within reach and bed alarm set, husband in room and MD present at [place].    ?Recommendations  for follow up therapy are one component of a multi-disciplinary discharge planning process, led by the attending physician.  Recommendations may be updated based on patient status, additional functional criteria and insurance authorization. ? ?Follow Up Recommendations ? Skilled nursing-short term rehab (<3 hours/day) ?  ?  ?Assistance Recommended at Discharge Set up Supervision/Assistance  ?Patient can return home with the following A little help with walking and/or transfers;A little help with bathing/dressing/bathroom;Help with stairs or ramp for entrance;Assistance with cooking/housework ?  ?Equipment Recommendations ? Rolling walker (2 wheels)  ?  ?Recommendations for Other Services   ? ? ?  ?Precautions / Restrictions Precautions ?Precautions: Fall ?Restrictions ?Weight Bearing Restrictions: No  ?  ? ?Mobility ? Bed Mobility ?Overal bed mobility: Modified Independent ?Bed Mobility: Supine to Sit ?  ?  ?Supine to sit: Min guard ?  ?  ?General bed mobility comments: increased time, labored movement, frequent rest breaks ?  ? ?Transfers ?Overall transfer level: Needs assistance ?Equipment used: Rolling walker (2 wheels), None ?Transfers: Sit to/from Stand ?Sit to Stand: Min assist, Mod assist ?  ?  ?  ?  ?  ?General transfer comment: unable to maintain standing balance without leaning on nearby objects for support, required use of RW for safety ?  ? ?Ambulation/Gait ?Ambulation/Gait assistance: Min assist ?Gait Distance (Feet): 35 Feet ?Assistive device: Rolling walker (2 wheels) ?Gait Pattern/deviations: Decreased step length - right, Decreased step  length - left, Decreased stride length ?Gait velocity: decreased ?  ?  ?General Gait Details: slow labored unsteady cadence, 2 trips to restroom.  While standing washing hands pt with 1 LOB required mod A for safety.  IV came out of arm during, RN notified and pt ambulated to chair.  Limited by weakness and fatigue. ? ? ?Stairs ?  ?  ?  ?  ?  ? ? ?Wheelchair  Mobility ?  ? ?Modified Rankin (Stroke Patients Only) ?  ? ? ?  ?Balance   ?  ?  ?  ?  ?  ?  ?  ?  ?  ?  ?  ?  ?  ?  ?  ?  ?  ?  ?  ? ?  ?Cognition Arousal/Alertness: Awake/alert ?Behavior During Therapy: [facility] for tasks assessed/performed ?Overall Cognitive Status: Within Functional Limits for tasks assessed ?  ?  ?  ?  ?  ?  ?  ?  ?  ?  ?  ?  ?  ?  ?  ?  ?  ?  ?  ? ?  ?Exercises General Exercises - Lower Extremity ?Long Arc Quad: Both, 10 reps ?Hip Flexion/Marching: Both, 10 reps, Seated ?Toe Raises: Both, 10 reps, Seated ?Heel Raises: Both, 10 reps, Seated ? ?  ?General Comments   ?  ?  ? ?Pertinent Vitals/Pain Pain Assessment ?Pain Assessment: No/denies pain  ? ? ?Home Living   ?  ?  ?  ?  ?  ?  ?  ?  ?  ?   ?  ?Prior Function    ?  ?  ?   ? ?PT Goals (current goals can now be found in the care plan section)   ? ?  ?Frequency ? ? ? Min 3X/week ? ? ? ?  ?PT Plan Current plan remains appropriate  ? ? ?Co-evaluation   ?  ?  ?  ?  ? ?  ?AM-PAC PT "6 Clicks" Mobility   ?Outcome Measure ? Help needed turning from your back to your side while in a flat bed without using bedrails?: A Little ?Help needed moving from lying on your back to sitting on the side of a flat bed without using bedrails?: A Little ?Help needed moving to and from a bed to a chair (including a wheelchair)?: A Lot ?Help needed standing up from a chair using your arms (e.g., wheelchair or bedside chair)?: A Lot ?Help needed to walk in hospital room?: A Lot ?Help needed climbing 3-5 steps with a railing? : A Lot ?6 Click Score: 14 ? ?  ?End of Session Equipment Utilized During Treatment: Gait belt ?Activity Tolerance: Patient tolerated treatment well;Patient limited by fatigue ?Patient left: in bed;with call bell/phone within reach;with family/visitor present;with nursing/sitter in room;with bed alarm set ?Nurse Communication: Mobility status ?PT Visit Diagnosis: Unsteadiness on feet (R26.81);Other abnormalities of gait and mobility (R26.89) ?   ? ? ?Time: 0950-1040 ?PT Time Calculation (min) (ACUTE ONLY): 50 min ? ?Charges:  $Therapeutic Exercise: 8-22 mins ?$Therapeutic Activity: 23-37 mins          ?          ?Ihor Austin, LPTA/CLT; CBIS ?[phone] ? ?Aldona Lento ?04/12/2021, 11:12 AM ? ?

## 2021-04-12 NOTE — Progress Notes (Signed)
PROGRESS NOTE    Diane Torres  ZOX:[id number] DOB: 26-Oct-1940 DOA: 04/06/2021 PCP: Benita Stabile, MD  Chief Complaint  Patient presents with   Fever    Brief Narrative:  81 year old female admitted to the hospital with abdominal pain, frequent stools, dehydration.  Found to have diverticulitis with acute kidney injury.  She was also having vomiting and subsequently developed hypoxia.  There was concern for possible aspiration.  She was treated with IV fluids and antibiotics.  Overall renal function is improving.  Diet is being advanced.  Diverticulitis appears to be improving with antibiotics.  Seen by physical therapy with recommendations for skilled nursing facility placement    Assessment & Plan:   Principal Problem:   Diverticulitis Active Problems:   Sepsis (HCC)   Generalized weakness   Acute respiratory failure with hypoxia (HCC)   CKD (chronic kidney disease) stage 3, GFR 30-59 ml/min (HCC)   AKI (acute kidney injury) (HCC)   Metabolic acidosis, normal anion gap (NAG)   Hyponatremia   Renal cyst, right   Mixed hyperlipidemia   GERD (gastroesophageal reflux disease)   Essential hypertension   Hyperkalemia   Normocytic anemia   Assessment and Plan: * Diverticulitis CT with mild diverticulitis involving the ascending colon Currently on IV antibiotics (3/5-present) Will follow  Sepsis (HCC) On admission: HR 106, R 21, Temp 103.2, AKI, acute respiratory failure with hypoxia Diverticulitis Continuing abx as noted above Sepsis physiology resolved  Generalized weakness She complaining of generalized weakness since her admission Has bilateral 4/5 weakness to lower extremities, upper extremity weakness more impressive (significant breakaway weakness) Imaging as below, no sensory deficits MRI lumbar spine with stable L1-2 disc protrusion and right cranial disc extrusion, moderate to severe right and mild L neural foraminal narrowing, subarticular recess narrowing with  mass effect on bilateral L2 nervs, R>L - moderate spinal canal stenosis at L2-3 with effacement of lateral recesses with mass effect on bilateral L3 nerve roots, L>R and moderate L foraminal - broad based disc protrusion at L3-4 with narrowing of bilateral recesses and mass effect on L4 nerve roots bilaterally Discussed lumbar spine findings with nsgy yesterday, no surgical indication at this point based on discussion MRI T and C spine without obvious cause She continues to complain of weakness, both upper and lower extremities - I suspect this is due to her systemic illness -> follow MRI brain   Acute respiratory failure with hypoxia (HCC) CT PT protocol with small bilateral pleural effusions with overlying atelectasis and patchy lower lobe infiltrates, concern for aspiration On appropriate abx Wean o2 as able SLP eval  Hyponatremia Mild, continue to monitor  Metabolic acidosis, normal anion gap (NAG) Likely related to GI losses Start oral bicarbonate replacement  AKI (acute kidney injury) (HCC) Renal function improved at this time  CKD (chronic kidney disease) stage 3, GFR 30-59 ml/min (HCC) CKD stage 3A Baseline creatinine approx 1.0  Renal cyst, right Incidental finding on CT abdomen Renal ultrasound did not definitively visualize cyst Can consider follow-up renal cyst protocol CTx  Mixed hyperlipidemia Continue statin  GERD (gastroesophageal reflux disease) Continue Protonix  Essential hypertension Blood pressures have been soft, holding losartan  Hyperkalemia 5.7, likely secondary to AKI Resolved after receiving IV fluids  Normocytic anemia Baseline hemoglobin between 10-11 Hemoglobin is 11.8 on admission, this is trended down to 8.8 with IV fluids Suspect some degree of hemodilution, may also have anemia of critical illness FOBT negative, and no reported visible blood loss Continue to monitor  DVT prophylaxis: SCD Code Status: full Family Communication:  husband Disposition:   Status is: Inpatient Remains inpatient appropriate because: need for IV abx, w/u weakness   Consultants:  none  Procedures:  none  Antimicrobials:  Anti-infectives (From admission, onward)    Start     Dose/Rate Route Frequency Ordered Stop   04/09/21 0000  cefTRIAXone (ROCEPHIN) 2 g in sodium chloride 0.9 % 100 mL IVPB        2 g 200 mL/hr over 30 Minutes Intravenous Every 24 hours 04/08/21 2006     04/08/21 2200  vancomycin (VANCOREADY) IVPB 1250 mg/250 mL  Status:  Discontinued        1,250 mg 166.7 mL/hr over 90 Minutes Intravenous Every 48 hours 04/06/21 2040 04/06/21 2314   04/08/21 1430  ceFEPIme (MAXIPIME) 2 g in sodium chloride 0.9 % 100 mL IVPB  Status:  Discontinued        2 g 200 mL/hr over 30 Minutes Intravenous Every 12 hours 04/08/21 1336 04/08/21 2006   04/07/21 2100  ceFEPIme (MAXIPIME) 2 g in sodium chloride 0.9 % 100 mL IVPB  Status:  Discontinued        2 g 200 mL/hr over 30 Minutes Intravenous Every 24 hours 04/06/21 2040 04/08/21 1336   04/07/21 0800  metroNIDAZOLE (FLAGYL) IVPB 500 mg        500 mg 100 mL/hr over 60 Minutes Intravenous Every 12 hours 04/07/21 0024 04/13/21 1959   04/06/21 2315  metroNIDAZOLE (FLAGYL) IVPB 500 mg  Status:  Discontinued        500 mg 100 mL/hr over 60 Minutes Intravenous Every 12 hours 04/06/21 2314 04/07/21 0024   04/06/21 2200  vancomycin (VANCOREADY) IVPB 1500 mg/300 mL        1,500 mg 150 mL/hr over 120 Minutes Intravenous  Once 04/06/21 2036 04/07/21 0502   04/06/21 2030  ceFEPIme (MAXIPIME) 2 g in sodium chloride 0.9 % 100 mL IVPB        2 g 200 mL/hr over 30 Minutes Intravenous  Once 04/06/21 2017 04/06/21 2208   04/06/21 2030  metroNIDAZOLE (FLAGYL) IVPB 500 mg        500 mg 100 mL/hr over 60 Minutes Intravenous  Once 04/06/21 2017 04/06/21 2206   04/06/21 2030  vancomycin (VANCOCIN) IVPB 1000 mg/200 mL premix  Status:  Discontinued        1,000 mg 200 mL/hr over 60 Minutes Intravenous   Once 04/06/21 2017 04/06/21 2036       Subjective: Feels week, not ready for discharge  Objective: Vitals:   04/12/21 1151 04/12/21 1154 04/12/21 1304 04/12/21 1305  BP: (!) 158/62   128/81  Pulse: (!) 107   89  Resp:    18  Temp:    98.4 F (36.9 C)  TempSrc:    Oral  SpO2: (!) 86% 95% 93% 93%  Weight:      Height:        Intake/Output Summary (Last 24 hours) at 04/12/2021 1655 Last data filed at 04/12/2021 1500 Gross per 24 hour  Intake 1366 ml  Output 2500 ml  Net -1134 ml   Filed Weights   04/06/21 1851  Weight: 70.3 kg    Examination:  General: NAD Cardiovascular: RRR Lungs: unlabored Abdomen: s/nt/nd Neurological: bilateral LE weakness, 4/5 strength, 4/5 strength to upper extremities with breakaway weakness - denies sensory deficits to light touch - stable exam today Extremities: no LEE  Data Reviewed: I have personally reviewed following labs and  imaging studies  CBC: Recent Labs  Lab 04/06/21 1910 04/07/21 0517 04/07/21 1915 04/08/21 0445 04/09/21 0839 04/10/21 0509 04/11/21 0518 04/12/21 0534  WBC 7.8 7.2   < > 7.3 7.7 9.0 9.2 9.9  NEUTROABS 5.6 3.6  --   --   --   --  5.2 5.9  HGB 11.8* 8.8*   < > 8.2* 8.1* 8.3* 8.0* 8.3*  HCT 38.3 27.5*   < > 25.7* 26.1* 26.6* 25.3* 26.6*  MCV 95.5 97.5   < > 98.1 97.0 95.7 96.2 95.7  PLT 296 259   < > 253 256 303 289 301   < > = values in this interval not displayed.    Basic Metabolic Panel: Recent Labs  Lab 04/07/21 0517 04/07/21 1915 04/08/21 0445 04/09/21 0839 04/10/21 0509 04/11/21 0518 04/12/21 0534  NA 132*   < > 130* 133* 133* 134* 133*  K 4.3   < > 4.5 4.4 3.8 3.8 3.6  CL 105   < > 103 105 103 102 101  CO2 20*   < > 21* 17* 22 24 24   GLUCOSE 102*   < > 98 82 115* 119* 109*  BUN 30*   < > 25* 18 14 9 9   CREATININE 1.50*   < > 1.27* 1.10* 0.99 0.87 0.95  CALCIUM 8.4*   < > 8.1* 8.0* 8.3* 7.9* 7.8*  MG 1.5*  --  2.6*  --   --  1.9 1.9  PHOS  --   --   --   --   --  2.2* 2.8   < > =  values in this interval not displayed.    GFR: Estimated Creatinine Clearance: 41.6 mL/min (by C-G formula based on SCr of 0.95 mg/dL).  Liver Function Tests: Recent Labs  Lab 04/08/21 0445 04/09/21 0839 04/10/21 0509 04/11/21 0518 04/12/21 0534  AST 17 13* 12* 11* 10*  ALT 10 11 10 8 8   ALKPHOS 37* 41 45 42 40  BILITOT 0.5 0.7 0.5 0.4 0.3  PROT 5.4* 5.6* 6.0* 5.5* 5.6*  ALBUMIN 2.3* 2.4* 2.5* 2.4* 2.4*    CBG: No results for input(s): GLUCAP in the last 168 hours.   Recent Results (from the past 240 hour(s))  Resp Panel by RT-PCR (Flu Catheryne Deford&B, Covid) Nasopharyngeal Swab     Status: None   Collection Time: 04/06/21  7:06 PM   Specimen: Nasopharyngeal Swab; Nasopharyngeal(NP) swabs in vial transport medium  Result Value Ref Range Status   SARS Coronavirus 2 by RT PCR NEGATIVE NEGATIVE Final    Comment: (NOTE) SARS-CoV-2 target nucleic acids are NOT DETECTED.  The SARS-CoV-2 RNA is generally detectable in upper respiratory specimens during the acute phase of infection. The lowest concentration of SARS-CoV-2 viral copies this assay can detect is 138 copies/mL. Margarethe Virgen negative result does not preclude SARS-Cov-2 infection and should not be used as the sole basis for treatment or other patient management decisions. Cherine Drumgoole negative result may occur with  improper specimen collection/handling, submission of specimen other than nasopharyngeal swab, presence of viral mutation(s) within the areas targeted by this assay, and inadequate number of viral copies(<138 copies/mL). Vernona Peake negative result must be combined with clinical observations, patient history, and epidemiological information. The expected result is Negative.  Fact Sheet for Patients:  BloggerCourse.com  Fact Sheet for Healthcare Providers:  SeriousBroker.it  This test is no t yet approved or cleared by the [place] FDA and  has been authorized for detection and/or diagnosis  of SARS-CoV-2  by FDA under an Emergency Use Authorization (EUA). This EUA will remain  in effect (meaning this test can be used) for the duration of the COVID-19 declaration under Section 564(b)(1) of the Act, 21 U.S.C.section 360bbb-3(b)(1), unless the authorization is terminated  or revoked sooner.       Influenza Ah Bott by PCR NEGATIVE NEGATIVE Final   Influenza B by PCR NEGATIVE NEGATIVE Final    Comment: (NOTE) The Xpert Xpress SARS-CoV-2/FLU/RSV plus assay is intended as an aid in the diagnosis of influenza from Nasopharyngeal swab specimens and should not be used as Mariadelaluz Gug[place] sole basis for treatment. Nasal washings and aspirates are unacceptable for Xpert Xpress SARS-CoV-2/FLU/RSV testing.  Fact Sheet for Patients: BloggerCourse.com  Fact Sheet for Healthcare Providers: SeriousBroker.it  This test is not yet approved or cleared by the [place] FDA and has been authorized for detection and/or diagnosis of SARS-CoV-2 by FDA under an Emergency Use Authorization (EUA). This EUA will remain in effect (meaning this test can be used) for the duration of the COVID-19 declaration under Section 564(b)(1) of the Act, 21 U.S.C. section 360bbb-3(b)(1), unless the authorization is terminated or revoked.  Performed at [facility], [address], [place], [place] [address]   Blood Culture (routine x 2)     Status: None   Collection Time: 04/06/21  7:10 PM   Specimen: Left Antecubital; Blood  Result Value Ref Range Status   Specimen Description   Final    LEFT ANTECUBITAL BOTTLES DRAWN AEROBIC AND ANAEROBIC   Special Requests Blood Culture adequate volume  Final   Culture   Final    NO GROWTH 5 DAYS Performed at [facility], [address], [place], [place] [address]    Report Status 04/11/2021 FINAL  Final  Blood Culture (routine x 2)     Status: None   Collection Time: 04/06/21  7:10 PM   Specimen: BLOOD LEFT HAND  Result Value Ref  Range Status   Specimen Description   Final    BLOOD LEFT HAND BOTTLES DRAWN AEROBIC AND ANAEROBIC   Special Requests Blood Culture adequate volume  Final   Culture   Final    NO GROWTH 5 DAYS Performed at [facility], [address], [place], [place] [address]    Report Status 04/11/2021 FINAL  Final  Urine Culture     Status: None   Collection Time: 04/06/21  7:22 PM   Specimen: In/Out Cath Urine  Result Value Ref Range Status   Specimen Description   Final    IN/OUT CATH URINE Performed at [facility], [address] [place], [place] [address]    Special Requests   Final    NONE Performed at [facility], [address] [place], [place] [address]    Culture   Final    NO GROWTH Performed at [facility], [address], [place], [place] [address]    Report Status 04/09/2021 FINAL  Final  C Difficile Quick Screen w PCR reflex     Status: None   Collection Time: 04/08/21  6:13 PM   Specimen: STOOL  Result Value Ref Range Status   C Diff antigen NEGATIVE NEGATIVE Final   C Diff toxin NEGATIVE NEGATIVE Final   C Diff interpretation No C. difficile detected.  Final    Comment: Performed at [facility], [address], [place], [place] [address]         Radiology Studies: MR CERVICAL SPINE WO CONTRAST  Result Date: 04/11/2021 CLINICAL DATA:  Bilateral upper extremity weakness. EXAM: MRI CERVICAL SPINE WITHOUT CONTRAST TECHNIQUE: Multiplanar, multisequence MR imaging of the cervical spine was performed. No intravenous contrast was administered. COMPARISON:  None. FINDINGS: Alignment: Normal Vertebrae: Normal bone marrow. Negative for fracture or mass. Hemangioma T6 vertebral body. Cord: No cord compression.  Spinal cord signal normal. Posterior Fossa, vertebral arteries, paraspinal tissues: Negative for paraspinous mass or adenopathy Disc levels: C2-3: Small central disc protrusion with mild spinal stenosis. C3-4: Small central disc protrusion and mild spurring. Mild  spinal stenosis C4-5: Disc degeneration with diffuse uncinate spurring. Moderate right foraminal narrowing and mild left foraminal narrowing. Spinal canal adequate in diameter. C5-6: Mild disc degeneration.  Negative for stenosis C6-7: Mild disc degeneration and spurring.  Negative for stenosis C7-T1: Negative IMPRESSION: Multilevel cervical spondylosis. Mild spinal stenosis C2-3 and C3-4. Moderate right foraminal narrowing at C4-5 due to spurring. Electronically Signed   By: Marlan Palau M.D.   On: 04/11/2021 15:52   MR THORACIC SPINE WO CONTRAST  Result Date: 04/11/2021 CLINICAL DATA:  Bilateral upper extremity weakness. EXAM: MRI THORACIC SPINE WITHOUT CONTRAST TECHNIQUE: Multiplanar, multisequence MR imaging of the thoracic spine was performed. No intravenous contrast was administered. COMPARISON:  None. FINDINGS: Alignment:  Normal Vertebrae: Negative for fracture or mass. Hemangioma T10 vertebral body. Cord: Negative for cord compression. Spinal cord signal normal throughout. Paraspinal and other soft tissues: Small bilateral pleural effusions. No paraspinous mass. Disc levels: T6-7: Moderate disc degeneration. Small central disc protrusion without stenosis. T8-9: Small central disc protrusion touching the cord. No cord compression T11-12: Disc degeneration with shallow central disc protrusion. Mild spinal stenosis T12-L1: Small right paracentral disc protrusion and spurring without significant stenosis. IMPRESSION: Negative for cord compression Thoracic degenerative changes as above. Mild spinal stenosis T11-12. Electronically Signed   By: Marlan Palau M.D.   On: 04/11/2021 15:58        Scheduled Meds:  ipratropium-albuterol  3 mL Nebulization TID   pantoprazole  40 mg Oral Daily   simvastatin  40 mg Oral QHS   sodium bicarbonate  1,300 mg Oral BID   Continuous Infusions:  cefTRIAXone (ROCEPHIN)  IV 2 g (04/11/21 2347)   metronidazole 500 mg (04/12/21 0935)     LOS: 6 days    Time  spent: over 30 min    Lacretia Nicks, MD [facility]   To contact the attending provider between 7A-7P or the covering provider during after hours 7P-7A, please log into the web site [email or web address] and access using universal [place] password for that web site. If you do not have the password, please call the hospital operator.  04/12/2021, 4:55 PM

## 2021-04-12 NOTE — Plan of Care (Signed)
?  Patient received from ED, alert and pleasant able to answer all questions appropriately. ? ? ?Problem: Education: ?Goal: Knowledge of General Education information will improve ?Description: Including pain rating scale, medication(s)/side effects and non-pharmacologic comfort measures ?Outcome: Progressing ?  ?Problem: Health Behavior/Discharge Planning: ?Goal: Ability to manage health-related needs will improve ?Outcome: Progressing ?  ?Problem: Clinical Measurements: ?Goal: Ability to maintain clinical measurements within normal limits will improve ?Outcome: Progressing ?Goal: Will remain free from infection ?Outcome: Progressing ?Goal: Diagnostic test results will improve ?Outcome: Progressing ?Goal: Respiratory complications will improve ?Outcome: Progressing ?Goal: Cardiovascular complication will be avoided ?Outcome: Progressing ?  ?Problem: Activity: ?Goal: Risk for activity intolerance will decrease ?Outcome: Progressing ?  ?Problem: Nutrition: ?Goal: Adequate nutrition will be maintained ?Outcome: Progressing ?  ?Problem: Coping: ?Goal: Level of anxiety will decrease ?Outcome: Progressing ?  ?Problem: Elimination: ?Goal: Will not experience complications related to bowel motility ?Outcome: Progressing ?Goal: Will not experience complications related to urinary retention ?Outcome: Progressing ?  ?Problem: Pain Managment: ?Goal: General experience of comfort will improve ?Outcome: Progressing ?  ?Problem: Safety: ?Goal: Ability to remain free from injury will improve ?Outcome: Progressing ?  ?Problem: Skin Integrity: ?Goal: Risk for impaired skin integrity will decrease ?Outcome: Progressing ?  ?

## 2021-04-12 NOTE — Care Management Important Message (Signed)
Important Message ? ?Patient Details  ?Name: Diane Torres ?MRN: [id number] ?Date of Birth: 05/21/1940 ? ? ?Medicare Important Message Given:  Yes ? ? ? ? ?Tommy Medal ?04/12/2021, 3:17 PM ?

## 2021-04-13 LAB — COMPREHENSIVE METABOLIC PANEL
ALT: 9 U/L (ref 0–44)
AST: 14 U/L — ABNORMAL LOW (ref 15–41)
Albumin: 2.4 g/dL — ABNORMAL LOW (ref 3.5–5.0)
Alkaline Phosphatase: 36 U/L — ABNORMAL LOW (ref 38–126)
Anion gap: 7 (ref 5–15)
BUN: 10 mg/dL (ref 8–23)
CO2: 27 mmol/L (ref 22–32)
Calcium: 7.8 mg/dL — ABNORMAL LOW (ref 8.9–10.3)
Chloride: 101 mmol/L (ref 98–111)
Creatinine, Ser: 0.86 mg/dL (ref 0.44–1.00)
GFR, Estimated: >60 mL/min (ref >60)
Glucose, Bld: 118 mg/dL — ABNORMAL HIGH (ref 70–99)
Potassium: 3.5 mmol/L (ref 3.5–5.1)
Sodium: 135 mmol/L (ref 135–145)
Total Bilirubin: <0.1 mg/dL — ABNORMAL LOW (ref 0.3–1.2)
Total Protein: 5.4 g/dL — ABNORMAL LOW (ref 6.5–8.1)

## 2021-04-13 LAB — CBC WITH DIFFERENTIAL/PLATELET
Band Neutrophils: 5 %
Basophils Absolute: 0.1 10*3/uL (ref 0.0–0.1)
Basophils Relative: 1 %
Eosinophils Absolute: 0.7 10*3/uL — ABNORMAL HIGH (ref 0.0–0.5)
Eosinophils Relative: 7 %
HCT: 26.7 % — ABNORMAL LOW (ref 36.0–46.0)
Hemoglobin: 8.6 g/dL — ABNORMAL LOW (ref 12.0–15.0)
Lymphocytes Relative: 20 %
Lymphs Abs: 1.9 10*3/uL (ref 0.7–4.0)
MCH: 31 pg (ref 26.0–34.0)
MCHC: 32.2 g/dL (ref 30.0–36.0)
MCV: 96.4 fL (ref 80.0–100.0)
Metamyelocytes Relative: 2 %
Monocytes Absolute: 0.6 10*3/uL (ref 0.1–1.0)
Monocytes Relative: 6 %
Neutro Abs: 6 10*3/uL (ref 1.7–7.7)
Neutrophils Relative %: 59 %
Platelets: 314 10*3/uL (ref 150–400)
RBC: 2.77 MIL/uL — ABNORMAL LOW (ref 3.87–5.11)
RDW: 14.5 % (ref 11.5–15.5)
WBC: 9.4 10*3/uL (ref 4.0–10.5)
nRBC: 0.2 % (ref 0.0–0.2)

## 2021-04-13 LAB — MAGNESIUM: Magnesium: 1.9 mg/dL (ref 1.7–2.4)

## 2021-04-13 LAB — PHOSPHORUS: Phosphorus: 3 mg/dL (ref 2.5–4.6)

## 2021-04-13 MED ORDER — METRONIDAZOLE 500 MG/100ML IV SOLN
500.0000 mg | Freq: Two times a day (BID) | INTRAVENOUS | Status: DC
  Administered 2021-04-13 – 2021-04-15 (×4): 500 mg via INTRAVENOUS
  Filled 2021-04-13 (×4): qty 100

## 2021-04-13 NOTE — Progress Notes (Signed)
PROGRESS NOTE    Diane Torres  KGM:[id number] DOB: 04/07/1940 DOA: 04/06/2021 PCP: Benita Stabile, MD  Chief Complaint  Patient presents with   Fever    Brief Narrative:  81 year old female admitted to the hospital with abdominal pain, frequent stools, dehydration.  Found to have diverticulitis with acute kidney injury.  She was also having vomiting and subsequently developed hypoxia.  There was concern for possible aspiration.  She was treated with IV fluids and antibiotics.  Overall renal function is improving.  Diet is being advanced.  Diverticulitis appears to be improving with antibiotics.  Seen by physical therapy with recommendations for skilled nursing facility placement    Assessment & Plan:   Principal Problem:   Diverticulitis Active Problems:   Sepsis (HCC)   Generalized weakness   Acute respiratory failure with hypoxia (HCC)   CKD (chronic kidney disease) stage 3, GFR 30-59 ml/min (HCC)   AKI (acute kidney injury) (HCC)   Metabolic acidosis, normal anion gap (NAG)   Hyponatremia   Renal cyst, right   Mixed hyperlipidemia   GERD (gastroesophageal reflux disease)   Essential hypertension   Hyperkalemia   Normocytic anemia   Assessment and Plan: * Diverticulitis CT with mild diverticulitis involving the ascending colon Currently on IV antibiotics (3/5-present), will plan for 10 days Will follow  Sepsis (HCC) On admission: HR 106, R 21, Temp 103.2, AKI, acute respiratory failure with hypoxia Diverticulitis Continuing abx as noted above Sepsis physiology resolved  Generalized weakness She complaining of generalized weakness since her admission, seems to be improving today Has bilateral 4/5 weakness to lower extremities, upper extremity weakness more impressive (significant breakaway weakness) Imaging as below, no sensory deficits MRI lumbar spine with stable L1-2 disc protrusion and right cranial disc extrusion, moderate to severe right and mild L neural  foraminal narrowing, subarticular recess narrowing with mass effect on bilateral L2 nervs, R>L - moderate spinal canal stenosis at L2-3 with effacement of lateral recesses with mass effect on bilateral L3 nerve roots, L>R and moderate L foraminal - broad based disc protrusion at L3-4 with narrowing of bilateral recesses and mass effect on L4 nerve roots bilaterally Discussed lumbar spine findings with nsgy yesterday, no surgical indication at this point based on discussion MRI T and C spine without obvious cause She continues to complain of weakness, both upper and lower extremities - I suspect this is due to her systemic illness -> follow MRI brain without acute intracranial process, TSH wnl, and CK mildly elevated  Acute respiratory failure with hypoxia (HCC) CT PT protocol with small bilateral pleural effusions with overlying atelectasis and patchy lower lobe infiltrates, concern for aspiration On appropriate abx Wean o2 as able SLP eval  Hyponatremia Mild, continue to monitor  Metabolic acidosis, normal anion gap (NAG) Likely related to GI losses Start oral bicarbonate replacement  AKI (acute kidney injury) (HCC) Renal function improved at this time  CKD (chronic kidney disease) stage 3, GFR 30-59 ml/min (HCC) CKD stage 3A Baseline creatinine approx 1.0  Renal cyst, right Incidental finding on CT abdomen Renal ultrasound did not definitively visualize cyst Can consider follow-up renal cyst protocol CTx  Mixed hyperlipidemia Continue statin  GERD (gastroesophageal reflux disease) Continue Protonix  Essential hypertension Blood pressures have been soft, holding losartan  Hyperkalemia 5.7, likely secondary to AKI Resolved after receiving IV fluids  Normocytic anemia Baseline hemoglobin between 10-11 Hemoglobin is 11.8 on admission, this is trended down to ~8 with IV fluids Suspect some degree of hemodilution, may  also have anemia of critical illness FOBT negative, and  no reported visible blood loss Continue to monitor   DVT prophylaxis: SCD Code Status: full Family Communication: husband, daughter Disposition:   Status is: Inpatient Remains inpatient appropriate because: need for IV abx, w/u weakness   Consultants:  none  Procedures:  none  Antimicrobials:  Anti-infectives (From admission, onward)    Start     Dose/Rate Route Frequency Ordered Stop   04/13/21 2000  metroNIDAZOLE (FLAGYL) IVPB 500 mg        500 mg 100 mL/hr over 60 Minutes Intravenous Every 12 hours 04/13/21 1123 04/16/21 1959   04/09/21 0000  cefTRIAXone (ROCEPHIN) 2 g in sodium chloride 0.9 % 100 mL IVPB        2 g 200 mL/hr over 30 Minutes Intravenous Every 24 hours 04/08/21 2006 04/16/21 1121   04/08/21 2200  vancomycin (VANCOREADY) IVPB 1250 mg/250 mL  Status:  Discontinued        1,250 mg 166.7 mL/hr over 90 Minutes Intravenous Every 48 hours 04/06/21 2040 04/06/21 2314   04/08/21 1430  ceFEPIme (MAXIPIME) 2 g in sodium chloride 0.9 % 100 mL IVPB  Status:  Discontinued        2 g 200 mL/hr over 30 Minutes Intravenous Every 12 hours 04/08/21 1336 04/08/21 2006   04/07/21 2100  ceFEPIme (MAXIPIME) 2 g in sodium chloride 0.9 % 100 mL IVPB  Status:  Discontinued        2 g 200 mL/hr over 30 Minutes Intravenous Every 24 hours 04/06/21 2040 04/08/21 1336   04/07/21 0800  metroNIDAZOLE (FLAGYL) IVPB 500 mg        500 mg 100 mL/hr over 60 Minutes Intravenous Every 12 hours 04/07/21 0024 04/13/21 0940   04/06/21 2315  metroNIDAZOLE (FLAGYL) IVPB 500 mg  Status:  Discontinued        500 mg 100 mL/hr over 60 Minutes Intravenous Every 12 hours 04/06/21 2314 04/07/21 0024   04/06/21 2200  vancomycin (VANCOREADY) IVPB 1500 mg/300 mL        1,500 mg 150 mL/hr over 120 Minutes Intravenous  Once 04/06/21 2036 04/07/21 0502   04/06/21 2030  ceFEPIme (MAXIPIME) 2 g in sodium chloride 0.9 % 100 mL IVPB        2 g 200 mL/hr over 30 Minutes Intravenous  Once 04/06/21 2017 04/06/21  2208   04/06/21 2030  metroNIDAZOLE (FLAGYL) IVPB 500 mg        500 mg 100 mL/hr over 60 Minutes Intravenous  Once 04/06/21 2017 04/06/21 2206   04/06/21 2030  vancomycin (VANCOCIN) IVPB 1000 mg/200 mL premix  Status:  Discontinued        1,000 mg 200 mL/hr over 60 Minutes Intravenous  Once 04/06/21 2017 04/06/21 2036       Subjective: Feels Banner Huckaba little better today Reginold Beale little stronger (today is the first day she's expressed feeling better)  Objective: Vitals:   04/12/21 2124 04/13/21 0431 04/13/21 0831 04/13/21 0833  BP: (!) 156/57 (!) 154/61 (!) 153/65   Pulse: (!) 101 82 80   Resp: 18 19 18    Temp: 98.7 F (37.1 C) 98.2 F (36.8 C) 98.4 F (36.9 C)   TempSrc:   Oral   SpO2: 92% 93% 95% 91%  Weight:      Height:        Intake/Output Summary (Last 24 hours) at 04/13/2021 1130 Last data filed at 04/13/2021 0905 Gross per 24 hour  Intake 1106 ml  Output 3100 ml  Net -1994 ml   Filed Weights   04/06/21 1851  Weight: 70.3 kg    Examination:  General: NAD Cardiovascular: RRR Lungs: unlabored Abdomen: s/nt/nd Neurological: improved LE strength today, 4+/5 strength, upper extremities with improved strength, but still breakaway weakness  Extremities: no LEE   Data Reviewed: I have personally reviewed following labs and imaging studies  CBC: Recent Labs  Lab 04/06/21 1910 04/07/21 0517 04/07/21 1915 04/09/21 0839 04/10/21 0509 04/11/21 0518 04/12/21 0534 04/13/21 0514  WBC 7.8 7.2   < > 7.7 9.0 9.2 9.9 9.4  NEUTROABS 5.6 3.6  --   --   --  5.2 5.9 6.0  HGB 11.8* 8.8*   < > 8.1* 8.3* 8.0* 8.3* 8.6*  HCT 38.3 27.5*   < > 26.1* 26.6* 25.3* 26.6* 26.7*  MCV 95.5 97.5   < > 97.0 95.7 96.2 95.7 96.4  PLT 296 259   < > 256 303 289 301 314   < > = values in this interval not displayed.    Basic Metabolic Panel: Recent Labs  Lab 04/07/21 0517 04/07/21 1915 04/08/21 0445 04/09/21 0839 04/10/21 0509 04/11/21 0518 04/12/21 0534 04/13/21 0514  NA 132*   < >  130* 133* 133* 134* 133* 135  K 4.3   < > 4.5 4.4 3.8 3.8 3.6 3.5  CL 105   < > 103 105 103 102 101 101  CO2 20*   < > 21* 17* 22 24 24 27   GLUCOSE 102*   < > 98 82 115* 119* 109* 118*  BUN 30*   < > 25* 18 14 9 9 10   CREATININE 1.50*   < > 1.27* 1.10* 0.99 0.87 0.95 0.86  CALCIUM 8.4*   < > 8.1* 8.0* 8.3* 7.9* 7.8* 7.8*  MG 1.5*  --  2.6*  --   --  1.9 1.9 1.9  PHOS  --   --   --   --   --  2.2* 2.8 3.0   < > = values in this interval not displayed.    GFR: Estimated Creatinine Clearance: 46 mL/min (by C-G formula based on SCr of 0.86 mg/dL).  Liver Function Tests: Recent Labs  Lab 04/09/21 0839 04/10/21 0509 04/11/21 0518 04/12/21 0534 04/13/21 0514  AST 13* 12* 11* 10* 14*  ALT 11 10 8 8 9   ALKPHOS 41 45 42 40 36*  BILITOT 0.7 0.5 0.4 0.3 <0.1*  PROT 5.6* 6.0* 5.5* 5.6* 5.4*  ALBUMIN 2.4* 2.5* 2.4* 2.4* 2.4*    CBG: No results for input(s): GLUCAP in the last 168 hours.   Recent Results (from the past 240 hour(s))  Resp Panel by RT-PCR (Flu Nani Ingram&B, Covid) Nasopharyngeal Swab     Status: None   Collection Time: 04/06/21  7:06 PM   Specimen: Nasopharyngeal Swab; Nasopharyngeal(NP) swabs in vial transport medium  Result Value Ref Range Status   SARS Coronavirus 2 by RT PCR NEGATIVE NEGATIVE Final    Comment: (NOTE) SARS-CoV-2 target nucleic acids are NOT DETECTED.  The SARS-CoV-2 RNA is generally detectable in upper respiratory specimens during the acute phase of infection. The lowest concentration of SARS-CoV-2 viral copies this assay can detect is 138 copies/mL. Dymphna Wadley negative result does not preclude SARS-Cov-2 infection and should not be used as the sole basis for treatment or other patient management decisions. Moranda Billiot negative result may occur with  improper specimen collection/handling, submission of specimen other than nasopharyngeal swab, presence of viral mutation(s) within the  areas targeted by this assay, and inadequate number of viral copies(<138 copies/mL). Jamielynn Wigley  negative result must be combined with clinical observations, patient history, and epidemiological information. The expected result is Negative.  Fact Sheet for Patients:  BloggerCourse.com  Fact Sheet for Healthcare Providers:  SeriousBroker.it  This test is no t yet approved or cleared by the [place] FDA and  has been authorized for detection and/or diagnosis of SARS-CoV-2 by FDA under an Emergency Use Authorization (EUA). This EUA will remain  in effect (meaning this test can be used) for the duration of the COVID-19 declaration under Section 564(b)(1) of the Act, 21 U.S.C.section 360bbb-3(b)(1), unless the authorization is terminated  or revoked sooner.       Influenza Faria Casella by PCR NEGATIVE NEGATIVE Final   Influenza B by PCR NEGATIVE NEGATIVE Final    Comment: (NOTE) The Xpert Xpress SARS-CoV-2/FLU/RSV plus assay is intended as an aid in the diagnosis of influenza from Nasopharyngeal swab specimens and should not be used as Kamron Vanwyhe sole basis for treatment. Nasal washings and aspirates are unacceptable for Xpert Xpress SARS-CoV-2/FLU/RSV testing.  Fact Sheet for Patients: BloggerCourse.com  Fact Sheet for Healthcare Providers: SeriousBroker.it  This test is not yet approved or cleared by the [place] FDA and has been authorized for detection and/or diagnosis of SARS-CoV-2 by FDA under an Emergency Use Authorization (EUA). This EUA will remain in effect (meaning this test can be used) for the duration of the COVID-19 declaration under Section 564(b)(1) of the Act, 21 U.S.C. section 360bbb-3(b)(1), unless the authorization is terminated or revoked.  Performed at [facility], [address], [place], [place] [address]   Blood Culture (routine x 2)     Status: None   Collection Time: 04/06/21  7:10 PM   Specimen: Left Antecubital; Blood  Result Value Ref Range Status    Specimen Description   Final    LEFT ANTECUBITAL BOTTLES DRAWN AEROBIC AND ANAEROBIC   Special Requests Blood Culture adequate volume  Final   Culture   Final    NO GROWTH 5 DAYS Performed at [facility], [address] [place], [place] [address]    Report Status 04/11/2021 FINAL  Final  Blood Culture (routine x 2)     Status: None   Collection Time: 04/06/21  7:10 PM   Specimen: BLOOD LEFT HAND  Result Value Ref Range Status   Specimen Description   Final    BLOOD LEFT HAND BOTTLES DRAWN AEROBIC AND ANAEROBIC   Special Requests Blood Culture adequate volume  Final   Culture   Final    NO GROWTH 5 DAYS Performed at [facility], [address], [place], [place] [address]    Report Status 04/11/2021 FINAL  Final  Urine Culture     Status: None   Collection Time: 04/06/21  7:22 PM   Specimen: In/Out Cath Urine  Result Value Ref Range Status   Specimen Description   Final    IN/OUT CATH URINE Performed at [facility], [address], [place], [place] [address]    Special Requests   Final    NONE Performed at [facility], [address], [place], [place] [address]    Culture   Final    NO GROWTH Performed at [facility], [address], [place], [place] [address]    Report Status 04/09/2021 FINAL  Final  C Difficile Quick Screen w PCR reflex     Status: None   Collection Time: 04/08/21  6:13 PM  Specimen: STOOL  Result Value Ref Range Status   C Diff antigen NEGATIVE NEGATIVE Final   C Diff toxin NEGATIVE NEGATIVE Final   C Diff interpretation No C. difficile detected.  Final    Comment: Performed at [facility], [address], [place], [place] [address]         Radiology Studies: MR BRAIN WO CONTRAST  Result Date: 04/12/2021 CLINICAL DATA:  Neuro deficit, acute, stroke suspected EXAM: MRI HEAD WITHOUT CONTRAST TECHNIQUE: Multiplanar, multiecho pulse sequences of the brain and surrounding structures were obtained without intravenous contrast. COMPARISON:   11/20/2020 FINDINGS: Brain: No restricted diffusion to suggest acute or subacute infarct. No acute hemorrhage, parenchymal mass, mass effect, or midline shift. No hydrocephalus or extra-axial collection. Redemonstrated expansion of the sella, with Tyshia Fenter sellar and suprasellar mass that measures approximately 1.9 x 1.9 x 2.2 cm (AP x TR x CC) (series 9, image 11 and series 10, image 6), previously 1.8 x 2.1 x 2.0 cm when remeasured similarly likely unchanged. This mass and is affect on the infundibulum and optic chiasm are better evaluated on the prior MRI. Additional enhancing mass along the right anterior parafalcine frontal convexity, which measures up to 2.9 x 2.6 x 2.6 cm (series 13, image 43 and series 9, image 10), previously 2.8 x 2.6 x 2.6 cm, also likely unchanged. Mass effect but no definite increased T2 signal in the underlying right frontal lobe. Adjacent 8 mm dural-based lesion along the right frontal convexity (series 12, image 21), unchanged. Vascular: Normal flow voids. Encasement of the left ICA by the pituitary mass is better evaluated on the prior MRI. Skull and upper cervical spine: Normal marrow signal. Sinuses/Orbits: No acute finding. Other: Trace fluid in the mastoid air cells. IMPRESSION: 1. No acute intracranial process. 2. Redemonstrated sellar and suprasellar mass, which is better evaluated on the prior exam. Within this limitation, it is grossly unchanged in size. 3. Unchanged right frontal convexity meningiomas which are also better evaluated on prior contrast enhanced study. Electronically Signed   By: Wiliam Ke M.D.   On: 04/12/2021 18:57   MR CERVICAL SPINE WO CONTRAST  Result Date: 04/11/2021 CLINICAL DATA:  Bilateral upper extremity weakness. EXAM: MRI CERVICAL SPINE WITHOUT CONTRAST TECHNIQUE: Multiplanar, multisequence MR imaging of the cervical spine was performed. No intravenous contrast was administered. COMPARISON:  None. FINDINGS: Alignment: Normal Vertebrae: Normal bone  marrow. Negative for fracture or mass. Hemangioma T6 vertebral body. Cord: No cord compression.  Spinal cord signal normal. Posterior Fossa, vertebral arteries, paraspinal tissues: Negative for paraspinous mass or adenopathy Disc levels: C2-3: Small central disc protrusion with mild spinal stenosis. C3-4: Small central disc protrusion and mild spurring. Mild spinal stenosis C4-5: Disc degeneration with diffuse uncinate spurring. Moderate right foraminal narrowing and mild left foraminal narrowing. Spinal canal adequate in diameter. C5-6: Mild disc degeneration.  Negative for stenosis C6-7: Mild disc degeneration and spurring.  Negative for stenosis C7-T1: Negative IMPRESSION: Multilevel cervical spondylosis. Mild spinal stenosis C2-3 and C3-4. Moderate right foraminal narrowing at C4-5 due to spurring. Electronically Signed   By: Marlan Palau M.D.   On: 04/11/2021 15:52   MR THORACIC SPINE WO CONTRAST  Result Date: 04/11/2021 CLINICAL DATA:  Bilateral upper extremity weakness. EXAM: MRI THORACIC SPINE WITHOUT CONTRAST TECHNIQUE: Multiplanar, multisequence MR imaging of the thoracic spine was performed. No intravenous contrast was administered. COMPARISON:  None. FINDINGS: Alignment:  Normal Vertebrae: Negative for fracture or mass. Hemangioma T10 vertebral body. Cord: Negative for cord compression. Spinal cord signal  normal throughout. Paraspinal and other soft tissues: Small bilateral pleural effusions. No paraspinous mass. Disc levels: T6-7: Moderate disc degeneration. Small central disc protrusion without stenosis. T8-9: Small central disc protrusion touching the cord. No cord compression T11-12: Disc degeneration with shallow central disc protrusion. Mild spinal stenosis T12-L1: Small right paracentral disc protrusion and spurring without significant stenosis. IMPRESSION: Negative for cord compression Thoracic degenerative changes as above. Mild spinal stenosis T11-12. Electronically Signed   By: Marlan Palau M.D.   On: 04/11/2021 15:58        Scheduled Meds:  ipratropium-albuterol  3 mL Nebulization TID   pantoprazole  40 mg Oral Daily   simvastatin  40 mg Oral QHS   sodium bicarbonate  1,300 mg Oral BID   Continuous Infusions:  cefTRIAXone (ROCEPHIN)  IV 2 g (04/12/21 2334)   metronidazole       LOS: 7 days    Time spent: over 30 min    Lacretia Nicks, MD [facility]   To contact the attending provider between 7A-7P or the covering provider during after hours 7P-7A, please log into the web site [email or web address] and access using universal [place] password for that web site. If you do not have the password, please call the hospital operator.  04/13/2021, 11:30 AM

## 2021-04-13 NOTE — TOC Progression Note (Signed)
Transition of Care (TOC) - Progression Note  ? ? ?Patient Details  ?Name: Diane Torres ?MRN: [id number] ?Date of Birth: [birth year] ? ?Transition of Care (TOC) CM/SW Contact  ?Kerin Salen, RN ?Phone Number: ?04/13/2021, 12:01 PM ? ?Clinical Narrative:  TOCRN attempted to call [facility] to inquire about discharge, no answer left voice message to return call.  ? ? ? ?Expected Discharge Plan: [place] ?Barriers to Discharge: Continued Medical Work up ? ?Expected Discharge Plan and Services ?Expected Discharge Plan: [place] ?In-house Referral: Clinical Social Work ?  ?Post Acute Care Choice: [facility] ?Living arrangements for the past 2 months: [place] ?                ?  ?  ?  ?  ?  ?  ?  ?  ?  ?  ? ? ?Social Determinants of Health (SDOH) Interventions ?  ? ?Readmission Risk Interventions ?No flowsheet data found. ? ?

## 2021-04-14 NOTE — Progress Notes (Signed)
PROGRESS NOTE    Diane Torres  ZOX:[id number] DOB: [birth year] DOA: 04/06/2021 PCP: Benita Stabile, MD  Chief Complaint  Patient presents with   Fever    Brief Narrative:  81 year old female admitted to the hospital with abdominal pain, frequent stools, dehydration.  Found to have diverticulitis with acute kidney injury.  She was also having vomiting and subsequently developed hypoxia.  There was concern for possible aspiration.  She was treated with IV fluids and antibiotics.  Overall renal function is improving.  Diet is being advanced.  Diverticulitis appears to be improving with antibiotics.  Seen by physical therapy with recommendations for skilled nursing facility placement    Assessment & Plan:   Principal Problem:   Diverticulitis Active Problems:   Sepsis (HCC)   Generalized weakness   Acute respiratory failure with hypoxia (HCC)   CKD (chronic kidney disease) stage 3, GFR 30-59 ml/min (HCC)   AKI (acute kidney injury) (HCC)   Metabolic acidosis, normal anion gap (NAG)   Hyponatremia   Renal cyst, right   Mixed hyperlipidemia   GERD (gastroesophageal reflux disease)   Essential hypertension   Hyperkalemia   Normocytic anemia   Assessment and Plan: * Diverticulitis CT with mild diverticulitis involving the ascending colon Currently on IV antibiotics (3/5-present), will plan for 10 days Will follow  Sepsis (HCC) On admission: HR 106, R 21, Temp 103.2, AKI, acute respiratory failure with hypoxia Diverticulitis Continuing abx as noted above Sepsis physiology resolved  Generalized weakness She complaining of generalized weakness since her admission, continuing to improve today Has bilateral 4/5 weakness to lower extremities, upper extremity weakness more impressive (significant breakaway weakness) Imaging as below, no sensory deficits MRI lumbar spine with stable L1-2 disc protrusion and right cranial disc extrusion, moderate to severe right and mild L neural  foraminal narrowing, subarticular recess narrowing with mass effect on bilateral L2 nervs, R>L - moderate spinal canal stenosis at L2-3 with effacement of lateral recesses with mass effect on bilateral L3 nerve roots, L>R and moderate L foraminal - broad based disc protrusion at L3-4 with narrowing of bilateral recesses and mass effect on L4 nerve roots bilaterally Discussed lumbar spine findings with nsgy yesterday, no surgical indication at this point based on discussion MRI T and C spine without obvious cause She continues to complain of weakness, both upper and lower extremities - I suspect this is due to her systemic illness -> follow MRI brain without acute intracranial process, TSH wnl, and CK mildly elevated  Acute respiratory failure with hypoxia (HCC) CT PT protocol with small bilateral pleural effusions with overlying atelectasis and patchy lower lobe infiltrates, concern for aspiration On appropriate abx Wean o2 as able SLP eval -> mild aspiration risk, regular/think liquid  Hyponatremia Mild, continue to monitor  Metabolic acidosis, normal anion gap (NAG) Likely related to GI losses Start oral bicarbonate replacement  AKI (acute kidney injury) (HCC) Renal function improved at this time  CKD (chronic kidney disease) stage 3, GFR 30-59 ml/min (HCC) CKD stage 3A Baseline creatinine approx 1.0  Renal cyst, right Incidental finding on CT abdomen Renal ultrasound did not definitively visualize cyst Can consider follow-up renal cyst protocol CTx  Mixed hyperlipidemia Continue statin  GERD (gastroesophageal reflux disease) Continue Protonix  Essential hypertension Blood pressures have been soft, holding losartan  Hyperkalemia 5.7, likely secondary to AKI Resolved after receiving IV fluids  Normocytic anemia Baseline hemoglobin between 10-11 Hemoglobin is 11.8 on admission, this is trended down to ~8 with IV fluids Suspect  some degree of hemodilution, may also have  anemia of critical illness FOBT negative, and no reported visible blood loss Continue to monitor   DVT prophylaxis: SCD Code Status: full Family Communication: husband, daughter Disposition:   Status is: Inpatient Remains inpatient appropriate because: need for IV abx, w/u weakness   Consultants:  none  Procedures:  none  Antimicrobials:  Anti-infectives (From admission, onward)    Start     Dose/Rate Route Frequency Ordered Stop   04/13/21 2000  metroNIDAZOLE (FLAGYL) IVPB 500 mg        500 mg 100 mL/hr over 60 Minutes Intravenous Every 12 hours 04/13/21 1123 04/16/21 1959   04/09/21 0000  cefTRIAXone (ROCEPHIN) 2 g in sodium chloride 0.9 % 100 mL IVPB        2 g 200 mL/hr over 30 Minutes Intravenous Every 24 hours 04/08/21 2006 04/16/21 1121   04/08/21 2200  vancomycin (VANCOREADY) IVPB 1250 mg/250 mL  Status:  Discontinued        1,250 mg 166.7 mL/hr over 90 Minutes Intravenous Every 48 hours 04/06/21 2040 04/06/21 2314   04/08/21 1430  ceFEPIme (MAXIPIME) 2 g in sodium chloride 0.9 % 100 mL IVPB  Status:  Discontinued        2 g 200 mL/hr over 30 Minutes Intravenous Every 12 hours 04/08/21 1336 04/08/21 2006   04/07/21 2100  ceFEPIme (MAXIPIME) 2 g in sodium chloride 0.9 % 100 mL IVPB  Status:  Discontinued        2 g 200 mL/hr over 30 Minutes Intravenous Every 24 hours 04/06/21 2040 04/08/21 1336   04/07/21 0800  metroNIDAZOLE (FLAGYL) IVPB 500 mg        500 mg 100 mL/hr over 60 Minutes Intravenous Every 12 hours 04/07/21 0024 04/13/21 0940   04/06/21 2315  metroNIDAZOLE (FLAGYL) IVPB 500 mg  Status:  Discontinued        500 mg 100 mL/hr over 60 Minutes Intravenous Every 12 hours 04/06/21 2314 04/07/21 0024   04/06/21 2200  vancomycin (VANCOREADY) IVPB 1500 mg/300 mL        1,500 mg 150 mL/hr over 120 Minutes Intravenous  Once 04/06/21 2036 04/07/21 0502   04/06/21 2030  ceFEPIme (MAXIPIME) 2 g in sodium chloride 0.9 % 100 mL IVPB        2 g 200 mL/hr over 30  Minutes Intravenous  Once 04/06/21 2017 04/06/21 2208   04/06/21 2030  metroNIDAZOLE (FLAGYL) IVPB 500 mg        500 mg 100 mL/hr over 60 Minutes Intravenous  Once 04/06/21 2017 04/06/21 2206   04/06/21 2030  vancomycin (VANCOCIN) IVPB 1000 mg/200 mL premix  Status:  Discontinued        1,000 mg 200 mL/hr over 60 Minutes Intravenous  Once 04/06/21 2017 04/06/21 2036       Subjective: No new complaints Hoping she'll be able to get up today  Objective: Vitals:   04/13/21 1453 04/13/21 2013 04/13/21 2019 04/14/21 0519  BP:   (!) 153/54 (!) 156/67  Pulse:   94 78  Resp:  18 17 17   Temp:   98.5 F (36.9 C) 98.4 F (36.9 C)  TempSrc:   Oral Oral  SpO2: (!) 86% 92% 95% 95%  Weight:      Height:        Intake/Output Summary (Last 24 hours) at 04/14/2021 1147 Last data filed at 04/14/2021 1110 Gross per 24 hour  Intake 1060 ml  Output 400 ml  Net  660 ml   Filed Weights   04/06/21 1851  Weight: 70.3 kg    Examination:  General: No acute distress. Cardiovascular: RRR Lungs: unlabored Abdomen: Soft, nontender, nondistended  Neurological: Alert and oriented 3. Moves all extremities 4 . Cranial nerves II through XII grossly intact. Skin: Warm and dry. No rashes or lesions. Extremities: No clubbing or cyanosis. No edema.   Data Reviewed: I have personally reviewed following labs and imaging studies  CBC: Recent Labs  Lab 04/09/21 0839 04/10/21 0509 04/11/21 0518 04/12/21 0534 04/13/21 0514  WBC 7.7 9.0 9.2 9.9 9.4  NEUTROABS  --   --  5.2 5.9 6.0  HGB 8.1* 8.3* 8.0* 8.3* 8.6*  HCT 26.1* 26.6* 25.3* 26.6* 26.7*  MCV 97.0 95.7 96.2 95.7 96.4  PLT 256 303 289 301 314    Basic Metabolic Panel: Recent Labs  Lab 04/08/21 0445 04/09/21 0839 04/10/21 0509 04/11/21 0518 04/12/21 0534 04/13/21 0514  NA 130* 133* 133* 134* 133* 135  K 4.5 4.4 3.8 3.8 3.6 3.5  CL 103 105 103 102 101 101  CO2 21* 17* 22 24 24 27   GLUCOSE 98 82 115* 119* 109* 118*  BUN 25* 18 14  9 9 10   CREATININE 1.27* 1.10* 0.99 0.87 0.95 0.86  CALCIUM 8.1* 8.0* 8.3* 7.9* 7.8* 7.8*  MG 2.6*  --   --  1.9 1.9 1.9  PHOS  --   --   --  2.2* 2.8 3.0    GFR: Estimated Creatinine Clearance: 46 mL/min (by C-G formula based on SCr of 0.86 mg/dL).  Liver Function Tests: Recent Labs  Lab 04/09/21 0839 04/10/21 0509 04/11/21 0518 04/12/21 0534 04/13/21 0514  AST 13* 12* 11* 10* 14*  ALT 11 10 8 8 9   ALKPHOS 41 45 42 40 36*  BILITOT 0.7 0.5 0.4 0.3 <0.1*  PROT 5.6* 6.0* 5.5* 5.6* 5.4*  ALBUMIN 2.4* 2.5* 2.4* 2.4* 2.4*    CBG: No results for input(s): GLUCAP in the last 168 hours.   Recent Results (from the past 240 hour(s))  Resp Panel by RT-PCR (Flu Chi Garlow&B, Covid) Nasopharyngeal Swab     Status: None   Collection Time: 04/06/21  7:06 PM   Specimen: Nasopharyngeal Swab; Nasopharyngeal(NP) swabs in vial transport medium  Result Value Ref Range Status   SARS Coronavirus 2 by RT PCR NEGATIVE NEGATIVE Final    Comment: (NOTE) SARS-CoV-2 target nucleic acids are NOT DETECTED.  The SARS-CoV-2 RNA is generally detectable in upper respiratory specimens during the acute phase of infection. The lowest concentration of SARS-CoV-2 viral copies this assay can detect is 138 copies/mL. Salisha Bardsley negative result does not preclude SARS-Cov-2 infection and should not be used as the sole basis for treatment or other patient management decisions. Rohith Fauth negative result may occur with  improper specimen collection/handling, submission of specimen other than nasopharyngeal swab, presence of viral mutation(s) within the areas targeted by this assay, and inadequate number of viral copies(<138 copies/mL). Kadynce Bonds negative result must be combined with clinical observations, patient history, and epidemiological information. The expected result is Negative.  Fact Sheet for Patients:  BloggerCourse.com  Fact Sheet for Healthcare Providers:   SeriousBroker.it  This test is no t yet approved or cleared by the [place] FDA and  has been authorized for detection and/or diagnosis of SARS-CoV-2 by FDA under an Emergency Use Authorization (EUA). This EUA will remain  in effect (meaning this test can be used) for the duration of the COVID-19 declaration under Section 564(b)(1) of  the Act, 21 U.S.C.section 360bbb-3(b)(1), unless the authorization is terminated  or revoked sooner.       Influenza Orian Figueira by PCR NEGATIVE NEGATIVE Final   Influenza B by PCR NEGATIVE NEGATIVE Final    Comment: (NOTE) The Xpert Xpress SARS-CoV-2/FLU/RSV plus assay is intended as an aid in the diagnosis of influenza from Nasopharyngeal swab specimens and should not be used as Allen Basista sole basis for treatment. Nasal washings and aspirates are unacceptable for Xpert Xpress SARS-CoV-2/FLU/RSV testing.  Fact Sheet for Patients: BloggerCourse.com  Fact Sheet for Healthcare Providers: SeriousBroker.it  This test is not yet approved or cleared by the [place] FDA and has been authorized for detection and/or diagnosis of SARS-CoV-2 by FDA under an Emergency Use Authorization (EUA). This EUA will remain in effect (meaning this test can be used) for the duration of the COVID-19 declaration under Section 564(b)(1) of the Act, 21 U.S.C. section 360bbb-3(b)(1), unless the authorization is terminated or revoked.  Performed at [facility], [address], [place], [place] [address]   Blood Culture (routine x 2)     Status: None   Collection Time: 04/06/21  7:10 PM   Specimen: Left Antecubital; Blood  Result Value Ref Range Status   Specimen Description   Final    LEFT ANTECUBITAL BOTTLES DRAWN AEROBIC AND ANAEROBIC   Special Requests Blood Culture adequate volume  Final   Culture   Final    NO GROWTH 5 DAYS Performed at [facility], [address], [place], [place] [address]     Report Status 04/11/2021 FINAL  Final  Blood Culture (routine x 2)     Status: None   Collection Time: 04/06/21  7:10 PM   Specimen: BLOOD LEFT HAND  Result Value Ref Range Status   Specimen Description   Final    BLOOD LEFT HAND BOTTLES DRAWN AEROBIC AND ANAEROBIC   Special Requests Blood Culture adequate volume  Final   Culture   Final    NO GROWTH 5 DAYS Performed at [facility], [address] [place], [place] [address]    Report Status 04/11/2021 FINAL  Final  Urine Culture     Status: None   Collection Time: 04/06/21  7:22 PM   Specimen: In/Out Cath Urine  Result Value Ref Range Status   Specimen Description   Final    IN/OUT CATH URINE Performed at [facility], [address], [place], [place] [address]    Special Requests   Final    NONE Performed at [facility], [address], [place], [place] [address]    Culture   Final    NO GROWTH Performed at [facility], [address], [place], [place] [address]    Report Status 04/09/2021 FINAL  Final  C Difficile Quick Screen w PCR reflex     Status: None   Collection Time: 04/08/21  6:13 PM   Specimen: STOOL  Result Value Ref Range Status   C Diff antigen NEGATIVE NEGATIVE Final   C Diff toxin NEGATIVE NEGATIVE Final   C Diff interpretation No C. difficile detected.  Final    Comment: Performed at [facility], [address], [place], [place] [address]         Radiology Studies: MR BRAIN WO CONTRAST  Result Date: 04/12/2021 CLINICAL DATA:  Neuro deficit, acute, stroke suspected EXAM: MRI HEAD WITHOUT CONTRAST TECHNIQUE: Multiplanar, multiecho pulse sequences of the brain and surrounding structures were obtained without intravenous contrast. COMPARISON:  11/20/2020 FINDINGS: Brain: No  restricted diffusion to suggest acute or subacute infarct. No acute hemorrhage, parenchymal mass, mass effect, or midline shift. No hydrocephalus or extra-axial collection. Redemonstrated expansion of the sella, with Chetan Mehring sellar and  suprasellar mass that measures approximately 1.9 x 1.9 x 2.2 cm (AP x TR x CC) (series 9, image 11 and series 10, image 6), previously 1.8 x 2.1 x 2.0 cm when remeasured similarly likely unchanged. This mass and is affect on the infundibulum and optic chiasm are better evaluated on the prior MRI. Additional enhancing mass along the right anterior parafalcine frontal convexity, which measures up to 2.9 x 2.6 x 2.6 cm (series 13, image 43 and series 9, image 10), previously 2.8 x 2.6 x 2.6 cm, also likely unchanged. Mass effect but no definite increased T2 signal in the underlying right frontal lobe. Adjacent 8 mm dural-based lesion along the right frontal convexity (series 12, image 21), unchanged. Vascular: Normal flow voids. Encasement of the left ICA by the pituitary mass is better evaluated on the prior MRI. Skull and upper cervical spine: Normal marrow signal. Sinuses/Orbits: No acute finding. Other: Trace fluid in the mastoid air cells. IMPRESSION: 1. No acute intracranial process. 2. Redemonstrated sellar and suprasellar mass, which is better evaluated on the prior exam. Within this limitation, it is grossly unchanged in size. 3. Unchanged right frontal convexity meningiomas which are also better evaluated on prior contrast enhanced study. Electronically Signed   By: Wiliam Ke M.D.   On: 04/12/2021 18:57        Scheduled Meds:  ipratropium-albuterol  3 mL Nebulization TID   pantoprazole  40 mg Oral Daily   simvastatin  40 mg Oral QHS   sodium bicarbonate  1,300 mg Oral BID   Continuous Infusions:  cefTRIAXone (ROCEPHIN)  IV 2 g (04/13/21 2348)   metronidazole Stopped (04/14/21 0928)     LOS: 8 days    Time spent: over 30 min    Lacretia Nicks, MD [facility]   To contact the attending provider between 7A-7P or the covering provider during after hours 7P-7A, please log into the web site [email or web address] and access using universal [place] password for that web site. If  you do not have the password, please call the hospital operator.  04/14/2021, 11:47 AM

## 2021-04-15 MED ORDER — AMOXICILLIN-POT CLAVULANATE 875-125 MG PO TABS: 1.0000 | ORAL_TABLET | Freq: Two times a day (BID) | ORAL | 0 refills | Status: AC

## 2021-04-15 NOTE — Progress Notes (Signed)
Ng Discharge Note ? ?Admit Date:  04/06/2021 ?Discharge date: 04/15/2021 ?  ?Diane Torres to be D/C'd Skilled nursing facility per MD order.  AVS completed. ?Patient/caregiver able to verbalize understanding. ? ?Discharge Medication: ?Allergies as of 04/15/2021   ? ?   Reactions  ? Bee Venom Anaphylaxis  ? ?  ? ?  ?Medication List  ?  ? ?TAKE these medications   ? ?acetaminophen 500 MG tablet ?Commonly known as: TYLENOL ?Take 1 tablet (500 mg total) by mouth every 6 (six) hours as needed. ?  ?amoxicillin-clavulanate 875-125 MG tablet ?Commonly known as: Augmentin ?Take 1 tablet by mouth 2 (two) times daily for 2 days. ?  ?cetirizine 10 MG tablet ?Commonly known as: ZYRTEC ?Take 10 mg by mouth daily. ?  ?losartan 100 MG tablet ?Commonly known as: COZAAR ?Take 100 mg by mouth daily. ?  ?Melatonin 10 MG Tabs ?Take by mouth. ?  ?pantoprazole 40 MG tablet ?Commonly known as: PROTONIX ?TAKE ONE TABLET BY MOUTH ONCE DAILY. ?  ?simvastatin 40 MG tablet ?Commonly known as: ZOCOR ?Take 40 mg by mouth at bedtime. ?  ? ?  ? ? ?Discharge Assessment: ?Vitals:  ? 04/14/21 2200 04/15/21 0744  ?BP: (!) 143/63   ?Pulse: 90   ?Resp: 18   ?Temp: 98.3 ?F (36.8 ?C)   ?SpO2: 96% 97%  ? Skin clean, dry and intact without evidence of skin break down, no evidence of skin tears noted. ?IV catheter discontinued intact. Site without signs and symptoms of complications - no redness or edema noted at insertion site, patient denies c/o pain - only slight tenderness at site.  Dressing with slight pressure applied. ? ?D/c Instructions-Education: ?Discharge instructions given to patient/family with verbalized understanding. ?D/c education completed with patient/family including follow up instructions, medication list, d/c activities limitations if indicated, with other d/c instructions as indicated by MD - patient able to verbalize understanding, all questions fully answered. ?Patient instructed to return to ED, call 911, or call MD for any changes in  condition.  ?Patient escorted via WC, and DC to SNF. ? ?Tsosie Billing, RN ?04/15/2021 2:24 PM   ?

## 2021-04-15 NOTE — Care Management Important Message (Signed)
Important Message ? ?Patient Details  ?Name: Diane Torres ?MRN: [id number] ?Date of Birth: 05/22/1940 ? ? ?Medicare Important Message Given:  Yes (spoke with spouse Charlie at [phone], no additional copy needed.) ? ? ? ? ?Tommy Medal ?04/15/2021, 11:22 AM ?

## 2021-04-15 NOTE — Discharge Summary (Signed)
Physician Discharge Summary  Diane Torres XBJ:[id number] DOB: [birth year] DOA: 04/06/2021  PCP: Benita Stabile, MD  Admit date: 04/06/2021 Discharge date: 04/15/2021  Time spent: 40 minutes  Recommendations for Outpatient Follow-up:  Follow outpatient CBC/CMP  Follow generalized weakness outpatient - improving, follow MRI's with her outpatient provider Follow renal cyst protocol CT outpatient in about 6 months Follow O2 requirement outpatient, wean as tolerated - consider pulm referral if persistent Follow anemia outpatient   Discharge Diagnoses:  Principal Problem:   Diverticulitis Active Problems:   Sepsis (HCC)   Generalized weakness   Acute respiratory failure with hypoxia (HCC)   CKD (chronic kidney disease) stage 3, GFR 30-59 ml/min (HCC)   AKI (acute kidney injury) (HCC)   Metabolic acidosis, normal anion gap (NAG)   Hyponatremia   Renal cyst, right   Mixed hyperlipidemia   GERD (gastroesophageal reflux disease)   Essential hypertension   Hyperkalemia   Normocytic anemia   Discharge Condition: stable  Diet recommendation: heart healthy  Filed Weights   04/06/21 1851  Weight: 70.3 kg    History of present illness:  81 year old female admitted to the hospital with abdominal pain, frequent stools, dehydration.  Found to have diverticulitis with acute kidney injury.  She was also having vomiting and subsequently developed hypoxia.  There was concern for possible aspiration.  She was treated with IV fluids and antibiotics.  Overall renal function is improving.  Diet is being advanced.  Diverticulitis appears to be improving with antibiotics.  Seen by physical therapy with recommendations for skilled nursing facility placement  Hospitalization complicated by generalized weakness, workup did not reveal [name] likely cause, suspect this was due to her acute illness.  Weakness is improving at discharge.  See below for additional details  Hospital Course:  Assessment and Plan: *  Diverticulitis CT with mild diverticulitis involving the ascending colon Currently on IV antibiotics (3/5-3/13), will plan for 10 days (d/c with augmentin) Seems improved  Sepsis (HCC) On admission: HR 106, R 21, Temp 103.2, AKI, acute respiratory failure with hypoxia Diverticulitis Continuing abx as noted above Sepsis physiology resolved  Generalized weakness Generalized weakness likely 2/2 systemic illness, imaging below to r/o other etiologies Has bilateral 4/5 weakness to lower extremities, upper extremity weakness more impressive (significant breakaway weakness) Imaging as below, no sensory deficits MRI lumbar spine with stable L1-2 disc protrusion and right cranial disc extrusion, moderate to severe right and mild L neural foraminal narrowing, subarticular recess narrowing with mass effect on bilateral L2 nervs, R>L - moderate spinal canal stenosis at L2-3 with effacement of lateral recesses with mass effect on bilateral L3 nerve roots, L>R and moderate L foraminal - broad based disc protrusion at L3-4 with narrowing of bilateral recesses and mass effect on L4 nerve roots bilaterally Discussed lumbar spine findings with nsgy, no surgical indication at this point based on discussion MRI T and C spine without obvious cause  follow MRI brain without acute intracranial process - stable TSH wnl, and CK mildly elevated Would follow outpatient   Acute respiratory failure with hypoxia (HCC) CT PT protocol with small bilateral pleural effusions with overlying atelectasis and patchy lower lobe infiltrates, concern for aspiration On appropriate abx Wean o2 as able - currently on 2 L at discharge, will discharge on 2 L with plan to wean as tolerated for goal >92%. SLP eval -> mild aspiration risk, regular/think liquid  Hyponatremia Mild, continue to monitor  Metabolic acidosis, normal anion gap (NAG) resolved  AKI (acute kidney injury) (  HCC) Renal function improved at this time  CKD  (chronic kidney disease) stage 3, GFR 30-59 ml/min (HCC) CKD stage 3A Baseline creatinine approx 1.0  Renal cyst, right Incidental finding on CT abdomen Renal ultrasound did not definitively visualize cyst Can consider follow-up renal cyst protocol CTx  Mixed hyperlipidemia Continue statin  GERD (gastroesophageal reflux disease) Continue Protonix  Essential hypertension Will resume losartan at discharge  Hyperkalemia resolved  Normocytic anemia Baseline hemoglobin between 10-11 Hemoglobin is 11.8 on admission, this is trended down to ~8 with IV fluids Suspect some degree of hemodilution, may also have anemia of critical illness FOBT negative, and no reported visible blood loss Continue to monitor Follow outpatient   Procedures: none   Consultations: none  Discharge Exam: Vitals:   04/14/21 2200 04/15/21 0744  BP: (!) 143/63   Pulse: 90   Resp: 18   Temp: 98.3 F (36.8 C)   SpO2: 96% 97%   No new complaints Feels progressively better Husband at discharge  General: No acute distress. Cardiovascular: RRR Lungs: unlabored Abdomen: Soft, nontender, nondistended  Neurological: Alert and oriented 3. Moves all extremities 4. Cranial nerves II through XII grossly intact. Skin: Warm and dry. No rashes or lesions. Extremities: No clubbing or cyanosis. No edema.   Discharge Instructions   Discharge Instructions     Call MD for:  difficulty breathing, headache or visual disturbances   Complete by: As directed    Call MD for:  extreme fatigue   Complete by: As directed    Call MD for:  hives   Complete by: As directed    Call MD for:  persistant dizziness or light-headedness   Complete by: As directed    Call MD for:  persistant nausea and vomiting   Complete by: As directed    Call MD for:  redness, tenderness, or signs of infection (pain, swelling, redness, odor or green/yellow discharge around incision site)   Complete by: As directed    Call MD for:   severe uncontrolled pain   Complete by: As directed    Call MD for:  temperature >100.4   Complete by: As directed    Diet - low sodium heart healthy   Complete by: As directed    Discharge instructions   Complete by: As directed    You were seen for diverticulitis.  You've improved with antibiotics.  You had weakness that I think was [place] result of your systemic illness.  You had MRI's that did not reveal an obvious cause for your weakness.  You should follow up with your PCP, physiatrist, and neurosurgeon regarding these studies.   You had an abnormal CT scan which showed Tichina Koebel hyperdense focus, you need Americus Scheurich follow up renal cyst protocol CT in 6 months.  Follow up with your PCP regarding it.  You had chest CT findings concerning for aspiration.  You've been treated with antibiotics.  Follow repeat chest imaging outpatient.  You have an oxygen requirement at discharge.  This maybe related to your aspiration.  We'll discharge you on oxygen.  If this is unable to be weaned, you may need to see the lung doctors outpatient.  Return for new, recurrent, or worsening symptoms.  Please ask your PCP to request records from this hospitalization so they know what was done and what the next steps will be.   Increase activity slowly   Complete by: As directed       Allergies as of 04/15/2021  Reactions   Bee Venom Anaphylaxis        Medication List     TAKE these medications    acetaminophen 500 MG tablet Commonly known as: TYLENOL Take 1 tablet (500 mg total) by mouth every 6 (six) hours as needed.   amoxicillin-clavulanate 875-125 MG tablet Commonly known as: Augmentin Take 1 tablet by mouth 2 (two) times daily for 2 days.   cetirizine 10 MG tablet Commonly known as: ZYRTEC Take 10 mg by mouth daily.   losartan 100 MG tablet Commonly known as: COZAAR Take 100 mg by mouth daily.   Melatonin 10 MG Tabs Take by mouth.   pantoprazole 40 MG tablet Commonly known as:  PROTONIX TAKE ONE TABLET BY MOUTH ONCE DAILY.   simvastatin 40 MG tablet Commonly known as: ZOCOR Take 40 mg by mouth at bedtime.       Allergies  Allergen Reactions   Bee Venom Anaphylaxis    Contact information for follow-up providers     Benita Stabile, MD Follow up.   Specialty: Internal Medicine Contact information: [address] [place] [facility] [address] [phone]         Romero Belling, MD Follow up.   Specialty: Physical Medicine and Rehabilitation Contact information: [address] [place] [address] [phone]         Coletta Memos, MD Follow up.   Specialty: Neurosurgery Contact information: [address] [place] [address] [phone]              Contact information for after-discharge care     Destination     [facility] .   Service: Skilled Nursing Contact information: [address] [place] [address] [phone]                      The results of significant diagnostics from this hospitalization (including imaging, microbiology, ancillary and laboratory) are listed below for reference.    Significant Diagnostic Studies: CT ABDOMEN PELVIS WO CONTRAST  Result Date: 04/06/2021 CLINICAL DATA:  Fever with nausea and vomiting. EXAM: CT ABDOMEN AND PELVIS WITHOUT CONTRAST TECHNIQUE: Multidetector CT imaging of the abdomen and pelvis was performed following the standard protocol without IV contrast. RADIATION DOSE REDUCTION: This exam was performed according to the departmental dose-optimization program which includes automated exposure control, adjustment of the mA and/or kV according to patient size and/or use of iterative reconstruction technique. COMPARISON:  December 17, 2020 FINDINGS: Lower chest: Mild atelectasis is seen within the bilateral lung bases. Hepatobiliary: Punctate calcified granulomas are noted within the right lobe of the liver.  No gallstones, gallbladder wall thickening, or biliary dilatation. Pancreas: Unremarkable. No pancreatic ductal dilatation or surrounding inflammatory changes. Spleen: Normal in size without focal abnormality. Adrenals/Urinary Tract: Adrenal glands are unremarkable. Kidneys are normal in size, without renal calculi or hydronephrosis. An 11 mm x 7 mm mildly hyperdense focus (approximately 47.44 Hounsfield units) is seen within the mid right kidney (axial CT image 39, CT series 2). Bladder is unremarkable. Stomach/Bowel: Stomach is within normal limits. Appendix appears normal. No evidence of bowel dilatation. Numerous diverticula are seen throughout the large bowel. Mild pericolonic inflammatory fat stranding is seen along the posterior aspect of the ascending colon. Vascular/Lymphatic: Aortic atherosclerosis. No enlarged abdominal or pelvic lymph nodes. Reproductive: Status post hysterectomy. No adnexal masses. Other: No abdominal wall hernia or abnormality. No abdominopelvic ascites. Musculoskeletal: Degenerative changes seen throughout the lumbar spine. IMPRESSION: 1.  Findings consistent with mild acute diverticulitis involving the ascending colon. 2. Findings which may represent Laura Caldas hemorrhagic right renal cyst. Correlation with nonemergent renal ultrasound is recommended. 3. Mild bibasilar atelectasis. 4. Aortic atherosclerosis Aortic Atherosclerosis (ICD10-I70.0). Electronically Signed   By: Aram Candela M.D.   On: 04/06/2021 22:10   CT Angio Chest Pulmonary Embolism (PE) W or WO Contrast  Result Date: 04/10/2021 CLINICAL DATA:  Chest pain and shortness of breath. Cough and fever. EXAM: CT ANGIOGRAPHY CHEST WITH CONTRAST TECHNIQUE: Multidetector CT imaging of the chest was performed using the standard protocol during bolus administration of intravenous contrast. Multiplanar CT image reconstructions and MIPs were obtained to evaluate the vascular anatomy. RADIATION DOSE REDUCTION: This exam was performed  according to the departmental dose-optimization program which includes automated exposure control, adjustment of the mA and/or kV according to patient size and/or use of iterative reconstruction technique. CONTRAST:  75mL OMNIPAQUE IOHEXOL 350 MG/ML SOLN COMPARISON:  Chest x-ray from yesterday. FINDINGS: Cardiovascular: The heart is upper limits of normal in size for age. No pericardial effusion. The aorta is normal in caliber. Moderate scattered atherosclerotic calcifications. No aneurysm dissection. The branch vessels are patent. Scattered coronary artery calcifications. The pulmonary arterial tree is fairly well opacified. No filling defects to suggest pulmonary embolism. Mediastinum/Nodes: Borderline mediastinal and hilar lymph nodes, likely reactive/inflammatory. The esophagus is grossly normal. Lungs/Pleura: Small bilateral pleural effusions with overlying atelectasis and patchy lower lobe infiltrates. Aspiration would be Kierstan Auer consideration. No worrisome pulmonary lesions or pulmonary nodules. Upper Abdomen: Diffuse fatty infiltration of the liver. No hepatic lesions. Moderate aortic and branch vessel calcifications but no aneurysm or dissection. Musculoskeletal: No significant bony findings. Review of the MIP images confirms the above findings. IMPRESSION: 1. No CT findings for pulmonary embolism. 2. Normal caliber thoracic aorta without aneurysm or dissection. Moderate scattered atherosclerotic calcifications. 3. Small bilateral pleural effusions with overlying atelectasis and patchy lower lobe infiltrates. Aspiration would be Maryn Freelove consideration. 4. Diffuse fatty infiltration of the liver. 5. Aortic atherosclerosis. Aortic Atherosclerosis (ICD10-I70.0). Electronically Signed   By: Rudie Meyer M.D.   On: 04/10/2021 14:57   MR BRAIN WO CONTRAST  Result Date: 04/12/2021 CLINICAL DATA:  Neuro deficit, acute, stroke suspected EXAM: MRI HEAD WITHOUT CONTRAST TECHNIQUE: Multiplanar, multiecho pulse sequences of the  brain and surrounding structures were obtained without intravenous contrast. COMPARISON:  11/20/2020 FINDINGS: Brain: No restricted diffusion to suggest acute or subacute infarct. No acute hemorrhage, parenchymal mass, mass effect, or midline shift. No hydrocephalus or extra-axial collection. Redemonstrated expansion of the sella, with Cherylyn Sundby sellar and suprasellar mass that measures approximately 1.9 x 1.9 x 2.2 cm (AP x TR x CC) (series 9, image 11 and series 10, image 6), previously 1.8 x 2.1 x 2.0 cm when remeasured similarly likely unchanged. This mass and is affect on the infundibulum and optic chiasm are better evaluated on the prior MRI. Additional enhancing mass along the right anterior parafalcine frontal convexity, which measures up to 2.9 x 2.6 x 2.6 cm (series 13, image 43 and series 9, image 10), previously 2.8 x 2.6 x 2.6 cm, also likely unchanged. Mass effect but no definite increased T2 signal in the underlying right frontal lobe. Adjacent 8 mm dural-based lesion along the right frontal convexity (series 12, image 21), unchanged. Vascular: Normal flow voids. Encasement of the left ICA by the pituitary mass is better evaluated on the prior MRI. Skull and upper cervical spine: Normal marrow signal. Sinuses/Orbits: No acute finding. Other: Trace fluid in the mastoid air  cells. IMPRESSION: 1. No acute intracranial process. 2. Redemonstrated sellar and suprasellar mass, which is better evaluated on the prior exam. Within this limitation, it is grossly unchanged in size. 3. Unchanged right frontal convexity meningiomas which are also better evaluated on prior contrast enhanced study. Electronically Signed   By: Wiliam Ke M.D.   On: 04/12/2021 18:57   MR CERVICAL SPINE WO CONTRAST  Result Date: 04/11/2021 CLINICAL DATA:  Bilateral upper extremity weakness. EXAM: MRI CERVICAL SPINE WITHOUT CONTRAST TECHNIQUE: Multiplanar, multisequence MR imaging of the cervical spine was performed. No intravenous  contrast was administered. COMPARISON:  None. FINDINGS: Alignment: Normal Vertebrae: Normal bone marrow. Negative for fracture or mass. Hemangioma T6 vertebral body. Cord: No cord compression.  Spinal cord signal normal. Posterior Fossa, vertebral arteries, paraspinal tissues: Negative for paraspinous mass or adenopathy Disc levels: C2-3: Small central disc protrusion with mild spinal stenosis. C3-4: Small central disc protrusion and mild spurring. Mild spinal stenosis C4-5: Disc degeneration with diffuse uncinate spurring. Moderate right foraminal narrowing and mild left foraminal narrowing. Spinal canal adequate in diameter. C5-6: Mild disc degeneration.  Negative for stenosis C6-7: Mild disc degeneration and spurring.  Negative for stenosis C7-T1: Negative IMPRESSION: Multilevel cervical spondylosis. Mild spinal stenosis C2-3 and C3-4. Moderate right foraminal narrowing at C4-5 due to spurring. Electronically Signed   By: Marlan Palau M.D.   On: 04/11/2021 15:52   MR THORACIC SPINE WO CONTRAST  Result Date: 04/11/2021 CLINICAL DATA:  Bilateral upper extremity weakness. EXAM: MRI THORACIC SPINE WITHOUT CONTRAST TECHNIQUE: Multiplanar, multisequence MR imaging of the thoracic spine was performed. No intravenous contrast was administered. COMPARISON:  None. FINDINGS: Alignment:  Normal Vertebrae: Negative for fracture or mass. Hemangioma T10 vertebral body. Cord: Negative for cord compression. Spinal cord signal normal throughout. Paraspinal and other soft tissues: Small bilateral pleural effusions. No paraspinous mass. Disc levels: T6-7: Moderate disc degeneration. Small central disc protrusion without stenosis. T8-9: Small central disc protrusion touching the cord. No cord compression T11-12: Disc degeneration with shallow central disc protrusion. Mild spinal stenosis T12-L1: Small right paracentral disc protrusion and spurring without significant stenosis. IMPRESSION: Negative for cord compression Thoracic  degenerative changes as above. Mild spinal stenosis T11-12. Electronically Signed   By: Marlan Palau M.D.   On: 04/11/2021 15:58   MR LUMBAR SPINE WO CONTRAST  Result Date: 04/10/2021 CLINICAL DATA:  Bilateral lower extremity weakness, back pain EXAM: MRI LUMBAR SPINE WITHOUT CONTRAST TECHNIQUE: Multiplanar, multisequence MR imaging of the lumbar spine was performed. No intravenous contrast was administered. COMPARISON:  MRI lumbar spine dated September 24, 2020 FINDINGS: Segmentation:  Standard. Alignment:  Straightening of the lumbar spine Vertebrae:  No fracture, evidence of discitis, or bone lesion. Conus medullaris and cauda equina: Conus extends to the T12 level. Conus and cauda equina appear normal. Paraspinal and other soft tissues: Negative. Disc levels: T12-L1: Asymmetric disc protrusion to the right with right sub articular disc space narrowing. No significant spinal canal or neural foraminal narrowing L1-L2: Disc protrusion in right subarticular disc extrusion which extends cranially approximately 1.5 cm, unchanged. Moderate ligamentum flavum hypertrophy with narrowing of spinal canal. Moderate to severe right and mild left neural foraminal narrowing, not significantly changed from prior examination. L2-L3: Disc space height loss with eccentric left disc protrusion. Effacement of the bilateral subarticular lateral recess. Facet joint arthropathy, left worse than the right. Moderate left and neural foraminal narrowing, appears to be worsened since prior examination. L3-L4: Disc height loss and broad-based disc protrusion. Narrowing of bilateral lateral recesses,  left worse than the right. Ligamentum flavum hypertrophy with narrowing of spinal canal. No significant neural foraminal narrowing. L4-L5: Eccentric disc bulge to the right with mild left and moderate right subarticular recess narrowing. Ligamentum flavum hypertrophy. Moderate facet joint arthropathy. No significant neural foraminal narrowing.  L5-S1: Right eccentric disc protrusion with narrowing of the right lateral recess. Moderate facet joint arthropathy. No significant neural foraminal narrowing. IMPRESSION: 1. Stable appearance of the L1-L2 disc protrusion and right cranial disc extrusion. Moderate to severe right and mild left neural foraminal narrowing. Subarticular recess narrowing with mass effect on bilateral L2 nerves, right worse than the left. 2. Moderate spinal canal stenosis at L2-L3 with effacement of the lateral recesses with mass effect on bilateral L3 nerve roots, left worse than the right and moderate left neural foraminal, the findings are worsened since prior examination. 3. Broad-based disc protrusion at L3-L4 with narrowing of bilateral lateral recesses and mass effect on L4 nerve roots bilaterally. No significant neural foraminal narrowing. Findings are not significantly changed since prior examination. Electronically Signed   By: Larose Hires D.O.   On: 04/10/2021 16:07   US RENAL  Result Date: 04/07/2021 CLINICAL DATA:  AK I EXAM: RENAL / URINARY TRACT ULTRASOUND COMPLETE COMPARISON:  April 06, 2021 FINDINGS: Right Kidney: Renal measurements: 9.5 x 3.9 x 4.6 cm = volume: 89 mL. Echogenicity within normal limits. No mass or hydronephrosis visualized. Left Kidney: Renal measurements: 9.0 x 4.4 x 4.7 cm = volume: 97 mL. Echogenicity within normal limits. No mass or hydronephrosis visualized. Bladder: Appears normal for degree of bladder distention. Other: None. IMPRESSION: 1. No hydronephrosis. 2. Mildly hyperdense focus noted on recent CT is not definitively visualized sonographically. Consider follow-up renal cyst protocol CT in 6 months. Electronically Signed   By: Meda Klinefelter M.D.   On: 04/07/2021 12:16   DG CHEST PORT 1 VIEW  Result Date: 04/09/2021 CLINICAL DATA:  Wheezing. EXAM: PORTABLE CHEST 1 VIEW COMPARISON:  Chest x-ray 04/07/2021. FINDINGS: There is stable mild elevation of the right hemidiaphragm. Findings  bibasilar atelectasis is similar to the prior study. There is no new lung consolidation, pleural effusion or pneumothorax identified. Cardiomediastinal silhouette is stable, the heart is mildly enlarged. No acute fractures are seen. IMPRESSION: 1. No acute cardiopulmonary process. Electronically Signed   By: Darliss Cheney M.D.   On: 04/09/2021 22:16   DG Chest Portable 1 View  Result Date: 04/07/2021 CLINICAL DATA:  81 year old female with history of shortness of breath. Possible aspiration. EXAM: PORTABLE CHEST 1 VIEW COMPARISON:  Chest x-ray 04/06/2021. FINDINGS: Marked elevation of the right hemidiaphragm. Bibasilar opacities, likely subsegmental atelectasis. No pneumothorax. No evidence of pulmonary edema. Heart size is normal. Upper mediastinal contours are within normal limits. IMPRESSION: 1. Low lung volumes with bibasilar subsegmental atelectasis and chronic elevation of the right hemidiaphragm. Electronically Signed   By: Trudie Reed M.D.   On: 04/07/2021 06:00   DG Chest Port 1 View  Result Date: 04/06/2021 CLINICAL DATA:  Questionable sepsis - evaluate for abnormality EXAM: PORTABLE CHEST 1 VIEW COMPARISON:  March 20, 2009 October 20, 2019 FINDINGS: The cardiomediastinal silhouette is unchanged in contour.Suboptimal visualization of the LEFT hemidiaphragm is favored to be due to technical related factors. No pleural effusion. No pneumothorax. No acute pleuroparenchymal abnormality. Visualized abdomen is unremarkable. IMPRESSION: No acute cardiopulmonary abnormality. Electronically Signed   By: Meda Klinefelter M.D.   On: 04/06/2021 19:37    Microbiology: Recent Results (from the past 240 hour(s))  Resp Panel by  RT-PCR (Flu Manfred Laspina&B, Covid) Nasopharyngeal Swab     Status: None   Collection Time: 04/06/21  7:06 PM   Specimen: Nasopharyngeal Swab; Nasopharyngeal(NP) swabs in vial transport medium  Result Value Ref Range Status   SARS Coronavirus 2 by RT PCR NEGATIVE NEGATIVE Final     Comment: (NOTE) SARS-CoV-2 target nucleic acids are NOT DETECTED.  The SARS-CoV-2 RNA is generally detectable in upper respiratory specimens during the acute phase of infection. The lowest concentration of SARS-CoV-2 viral copies this assay can detect is 138 copies/mL. Troy Hartzog negative result does not preclude SARS-Cov-2 infection and should not be used as the sole basis for treatment or other patient management decisions. Briele Lagasse negative result may occur with  improper specimen collection/handling, submission of specimen other than nasopharyngeal swab, presence of viral mutation(s) within the areas targeted by this assay, and inadequate number of viral copies(<138 copies/mL). Mercedies Ganesh negative result must be combined with clinical observations, patient history, and epidemiological information. The expected result is Negative.  Fact Sheet for Patients:  BloggerCourse.com  Fact Sheet for Healthcare Providers:  SeriousBroker.it  This test is no t yet approved or cleared by the [place] FDA and  has been authorized for detection and/or diagnosis of SARS-CoV-2 by FDA under an Emergency Use Authorization (EUA). This EUA will remain  in effect (meaning this test can be used) for the duration of the COVID-19 declaration under Section 564(b)(1) of the Act, 21 U.S.C.section 360bbb-3(b)(1), unless the authorization is terminated  or revoked sooner.       Influenza Maison Kestenbaum by PCR NEGATIVE NEGATIVE Final   Influenza B by PCR NEGATIVE NEGATIVE Final    Comment: (NOTE) The Xpert Xpress SARS-CoV-2/FLU/RSV plus assay is intended as an aid in the diagnosis of influenza from Nasopharyngeal swab specimens and should not be used as Collyn Selk sole basis for treatment. Nasal washings and aspirates are unacceptable for Xpert Xpress SARS-CoV-2/FLU/RSV testing.  Fact Sheet for Patients: BloggerCourse.com  Fact Sheet for Healthcare  Providers: SeriousBroker.it  This test is not yet approved or cleared by the [place] FDA and has been authorized for detection and/or diagnosis of SARS-CoV-2 by FDA under an Emergency Use Authorization (EUA). This EUA will remain in effect (meaning this test can be used) for the duration of the COVID-19 declaration under Section 564(b)(1) of the Act, 21 U.S.C. section 360bbb-3(b)(1), unless the authorization is terminated or revoked.  Performed at [facility], [address], [place], [place] [address]   Blood Culture (routine x 2)     Status: None   Collection Time: 04/06/21  7:10 PM   Specimen: Left Antecubital; Blood  Result Value Ref Range Status   Specimen Description   Final    LEFT ANTECUBITAL BOTTLES DRAWN AEROBIC AND ANAEROBIC   Special Requests Blood Culture adequate volume  Final   Culture   Final    NO GROWTH 5 DAYS Performed at [facility], [address], [place], [place] [address]    Report Status 04/11/2021 FINAL  Final  Blood Culture (routine x 2)     Status: None   Collection Time: 04/06/21  7:10 PM   Specimen: BLOOD LEFT HAND  Result Value Ref Range Status   Specimen Description   Final    BLOOD LEFT HAND BOTTLES DRAWN AEROBIC AND ANAEROBIC   Special Requests Blood Culture adequate volume  Final   Culture   Final    NO GROWTH 5 DAYS Performed at [facility], [address], [place], [place] [address]  Report Status 04/11/2021 FINAL  Final  Urine Culture     Status: None   Collection Time: 04/06/21  7:22 PM   Specimen: In/Out Cath Urine  Result Value Ref Range Status   Specimen Description   Final    IN/OUT CATH URINE Performed at [facility], [address], [place], [place] [address]    Special Requests   Final    NONE Performed at [facility], [address] [place], [place] [address]    Culture   Final    NO GROWTH Performed at [facility], [address] [place], [place] [address]    Report Status  04/09/2021 FINAL  Final  C Difficile Quick Screen w PCR reflex     Status: None   Collection Time: 04/08/21  6:13 PM   Specimen: STOOL  Result Value Ref Range Status   C Diff antigen NEGATIVE NEGATIVE Final   C Diff toxin NEGATIVE NEGATIVE Final   C Diff interpretation No C. difficile detected.  Final    Comment: Performed at [facility], [address], [place], [place] [address]     Labs: Basic Metabolic Panel: Recent Labs  Lab 04/09/21 0839 04/10/21 0509 04/11/21 0518 04/12/21 0534 04/13/21 0514  NA 133* 133* 134* 133* 135  K 4.4 3.8 3.8 3.6 3.5  CL 105 103 102 101 101  CO2 17* 22 24 24 27   GLUCOSE 82 115* 119* 109* 118*  BUN 18 14 9 9 10   CREATININE 1.10* 0.99 0.87 0.95 0.86  CALCIUM 8.0* 8.3* 7.9* 7.8* 7.8*  MG  --   --  1.9 1.9 1.9  PHOS  --   --  2.2* 2.8 3.0   Liver Function Tests: Recent Labs  Lab 04/09/21 0839 04/10/21 0509 04/11/21 0518 04/12/21 0534 04/13/21 0514  AST 13* 12* 11* 10* 14*  ALT 11 10 8 8 9   ALKPHOS 41 45 42 40 36*  BILITOT 0.7 0.5 0.4 0.3 <0.1*  PROT 5.6* 6.0* 5.5* 5.6* 5.4*  ALBUMIN 2.4* 2.5* 2.4* 2.4* 2.4*   No results for input(s): LIPASE, AMYLASE in the last 168 hours. No results for input(s): AMMONIA in the last 168 hours. CBC: Recent Labs  Lab 04/09/21 0839 04/10/21 0509 04/11/21 0518 04/12/21 0534 04/13/21 0514  WBC 7.7 9.0 9.2 9.9 9.4  NEUTROABS  --   --  5.2 5.9 6.0  HGB 8.1* 8.3* 8.0* 8.3* 8.6*  HCT 26.1* 26.6* 25.3* 26.6* 26.7*  MCV 97.0 95.7 96.2 95.7 96.4  PLT 256 303 289 301 314   Cardiac Enzymes: Recent Labs  Lab 04/12/21 1703  CKTOTAL 262*   BNP: BNP (last 3 results) No results for input(s): BNP in the last 8760 hours.  ProBNP (last 3 results) No results for input(s): PROBNP in the last 8760 hours.  CBG: No results for input(s): GLUCAP in the last 168 hours.     Signed:  Lacretia Nicks MD.  [facility] 04/15/2021, 12:24 PM

## 2021-04-15 NOTE — TOC Transition Note (Signed)
Transition of Care (TOC) - CM/SW Discharge Note ? ? ?Patient Details  ?Name: Diane Torres ?MRN: [id number] ?Date of Birth: [birth year] ? ?Transition of Care (TOC) CM/SW Contact:  ?Salome Arnt, LCSW ?Phone Number: ?04/15/2021, 11:52 AM ? ? ?Clinical Narrative:  Per MD, d/c today to [facility]. Pt and facility aware and agreeable. Pt reports she will notify family. RN given number to call report. SNF has authorization. Pt will transfer with staff.   ? ? ? ?Final next level of care: [facility] ?Barriers to Discharge: Barriers Resolved ? ? ?Patient Goals and CMS Choice ?Patient states their goals for this hospitalization and ongoing recovery are:: short term rehab ?  ?Choice offered to / list presented to : Patient ? ?Discharge Placement ?  ?Existing PASRR number confirmed : 04/15/21          ?Patient chooses bed at: [facility] ?Patient to be transferred to facility by: staff ?Name of family member notified: Pt only ?Patient and family notified of of transfer: 04/15/21 ? ?Discharge Plan and Services ?In-house Referral: Clinical Social Work ?  ?Post Acute Care Choice: [place]          ?  ?  ?  ?  ?  ?  ?  ?  ?  ?  ? ?Social Determinants of Health (SDOH) Interventions ?  ? ? ?Readmission Risk Interventions ?No flowsheet data found. ? ? ? ? ?

## 2021-04-16 ENCOUNTER — Encounter: Payer: Self-pay | Admitting: Adult Health

## 2021-04-16 ENCOUNTER — Non-Acute Institutional Stay (SKILLED_NURSING_FACILITY): Payer: Self-pay | Admitting: Adult Health

## 2021-04-16 DIAGNOSIS — I10 Essential (primary) hypertension: Secondary | ICD-10-CM

## 2021-04-16 DIAGNOSIS — D649 Anemia, unspecified: Secondary | ICD-10-CM

## 2021-04-16 DIAGNOSIS — J9601 Acute respiratory failure with hypoxia: Secondary | ICD-10-CM

## 2021-04-16 DIAGNOSIS — E782 Mixed hyperlipidemia: Secondary | ICD-10-CM

## 2021-04-16 DIAGNOSIS — E039 Hypothyroidism, unspecified: Secondary | ICD-10-CM

## 2021-04-16 DIAGNOSIS — A419 Sepsis, unspecified organism: Secondary | ICD-10-CM

## 2021-04-16 DIAGNOSIS — N1831 Chronic kidney disease, stage 3a: Secondary | ICD-10-CM

## 2021-04-16 DIAGNOSIS — K5792 Diverticulitis of intestine, part unspecified, without perforation or abscess without bleeding: Secondary | ICD-10-CM

## 2021-04-16 DIAGNOSIS — K219 Gastro-esophageal reflux disease without esophagitis: Secondary | ICD-10-CM

## 2021-04-16 DIAGNOSIS — J3089 Other allergic rhinitis: Secondary | ICD-10-CM

## 2021-04-16 DIAGNOSIS — N179 Acute kidney failure, unspecified: Secondary | ICD-10-CM

## 2021-04-16 DIAGNOSIS — R6520 Severe sepsis without septic shock: Secondary | ICD-10-CM

## 2021-04-16 LAB — HEMOGLOBIN A1C
Hgb A1c MFr Bld: 6 % — ABNORMAL HIGH (ref 4.8–5.6)
Mean Plasma Glucose: 126 mg/dL

## 2021-04-16 NOTE — Progress Notes (Signed)
? ?Location:  [place] ?Nursing Home Room Number: 937 ?Place of Service:  SNF (31) ? ? ?CODE STATUS: full code  ? ?Allergies  ?Allergen Reactions  ? Bee Venom Anaphylaxis  ? ? ?Chief Complaint  ?Patient presents with  ? Hospitalization Follow-up  ? ? ?HPI: ? ? ?She is a 81 year old woman who has been hospitalized from 04-06-21 through 04-15-21. Her medical history includes: diverticulosis; hypertension; hyperlipidemia; gerd. She presented to the ED with abdominal pain; frequent stools; dehydration. She was found to have acute diverticulitis with AKI.  ?Diverticulitis: found on ct scan treated with IV abt; will need to complete 2 more days of augmentin. She developed vomiting; and became hypoxic. Is on 02 at this time. She was treated for sepsis; which has physically resolved. She continues to be weak will need acute rehab. She is here for short term with her goal to return back home. She has had insomnia; has generalized weakness and fatigue. She will continue to be followed for her chronic illnesses including: Gastroesophageal reflux disease without esophagitis: is stable will continue protonix 40 mg daily   Essential hypertension:. Hypothyroidism unspecified type:Marland Kitchen Stage 3a chronic kidney disease ? ?Past Medical History:  ?Diagnosis Date  ? CAP (community acquired pneumonia) 05/2018  ? Diverticulitis   ? CT verified in 2015, 2018, 2019, 2020  ? GERD (gastroesophageal reflux disease)   ? Hypothyroidism   ? Impaired fasting glucose   ? Mixed hyperlipidemia   ? ? ?Past Surgical History:  ?Procedure Laterality Date  ? COLONOSCOPY    ? COLONOSCOPY N/A 12/12/2015  ? Dr. Gala Romney: one 3 mm tubular adenoma in rectum s/p removal. Diverticulosis in entire colon. Internal hemorrhoids.   ? ESOPHAGOGASTRODUODENOSCOPY N/A 03/19/2016  ? Dr. Gala Romney: LA Grade A esophagitis, empiric dilation, normal stomach, normal duodenum  ? Left lobe thyroidectomy    ? MALONEY DILATION N/A 03/19/2016  ? Procedure: MALONEY DILATION;  Surgeon:  Daneil Dolin, MD;  [facility]: [facility];  Service: Endoscopy;  Laterality: N/A;  ? POLYPECTOMY  12/12/2015  ? Procedure: POLYPECTOMY;  Surgeon: Daneil Dolin, MD;  Location: [facility];  Service: Endoscopy;;  colon  ? RECTOCELE REPAIR    ? TOTAL ABDOMINAL HYSTERECTOMY W/ BILATERAL SALPINGOOPHORECTOMY    ? ? ?Social History  ? ?Socioeconomic History  ? Marital status: Married  ?  Spouse name: Not on file  ? Number of children: Not on file  ? Years of education: Not on file  ? Highest education level: Not on file  ?Occupational History  ? Not on file  ?Tobacco Use  ? Smoking status: Never  ? Smokeless tobacco: Never  ?Vaping Use  ? Vaping Use: Never used  ?Substance and Sexual Activity  ? Alcohol use: No  ? Drug use: No  ? Sexual activity: Not on file  ?Other Topics Concern  ? Not on file  ?Social History Narrative  ? Not on file  ? ?Social Determinants of Health  ? ?Financial Resource Strain: Not on file  ?Food Insecurity: Not on file  ?Transportation Needs: Not on file  ?Physical Activity: Not on file  ?Stress: Not on file  ?Social Connections: Not on file  ?Intimate Partner Violence: Not on file  ? ?Family History  ?Problem Relation Age of Onset  ? CAD Brother   ?     2 brothers with CAD in their 70s  ? Crohn's disease Brother   ?     had colon resection  ? Pancreatic  cancer Brother   ? CAD Father   ? Heart failure Mother   ? Diabetes Mellitus II Mother   ? Colon cancer Neg Hx   ? ? ? ? ?VITAL SIGNS ?BP 140/72   Pulse 78   Temp (!) 97.3 ?F (36.3 ?C)   Resp 20   Ht '5\' 1"'$  (1.549 m)   Wt 157 lb 9.6 oz (71.5 kg)   SpO2 97%   BMI 29.78 kg/m?  ? ?Outpatient Encounter Medications as of 04/16/2021  ?Medication Sig  ? acetaminophen (TYLENOL) 500 MG tablet Take 1 tablet (500 mg total) by mouth every 6 (six) hours as needed.  ? amoxicillin-clavulanate (AUGMENTIN) 875-125 MG tablet Take 1 tablet by mouth 2 (two) times daily for 2 days.  ? cetirizine (ZYRTEC) 10 MG tablet Take 10 mg by mouth daily.  ? losartan  (COZAAR) 100 MG tablet Take 100 mg by mouth daily.  ? Melatonin 10 MG TABS Take by mouth. (Patient not taking: Reported on 04/07/2021)  ? pantoprazole (PROTONIX) 40 MG tablet TAKE ONE TABLET BY MOUTH ONCE DAILY. (Patient taking differently: Take 40 mg by mouth daily.)  ? simvastatin (ZOCOR) 40 MG tablet Take 40 mg by mouth at bedtime.   ? ?No facility-administered encounter medications on file as of 04/16/2021.  ? ? ? ?SIGNIFICANT DIAGNOSTIC EXAMS ? ?TODAY ? ?04-06-21: chest x-ray: No acute cardiopulmonary abnormality.  ? ?04-06-21: ct of abdomen and pelvis ?1. Findings consistent with mild acute diverticulitis involving the ascending colon. ?2. Findings which may represent a hemorrhagic right renal cyst.Correlation with nonemergent renal ultrasound is recommended. ?3. Mild bibasilar atelectasis. ?4. Aortic atherosclerosis ? ?04-07-21: renal ultrasound ?1. No hydronephrosis. ?2. Mildly hyperdense focus noted on recent CT is not definitively visualized sonographically. Consider follow-up renal cyst protocol ?CT in 6 months. ? ?04-10-21: ct angio of chest:  ?1. No CT findings for pulmonary embolism. ?2. Normal caliber thoracic aorta without aneurysm or dissection. Moderate scattered atherosclerotic calcifications. ?3. Small bilateral pleural effusions with overlying atelectasis and patchy lower lobe infiltrates. Aspiration would be a consideration. ?4. Diffuse fatty infiltration of the liver. ?5. Aortic atherosclerosis. ? ?LABS REVIEWED ? ?04-06-21: wbc 7.8; hgb 11.8; hct 38.3 mcv 95.5 plt 296; glucose 95; bun 32; creat 1.67; k+ 5.7; na++ 131; ca 9.5; GFR 31; protein 8.1; albumin 3.9 blood/urine no growth ?04-09-21: wbc 7.7; hgb 8.6; hct 26.1; mcv 97; plt 256; glucose 82; bun 18; creat 1.10; k+ 4.4; na++ 131; ca 8.0; GFR 50; protein 5.6; albumin 2.4 ?04-12-21: tsh 3.532 ?04-13-21: wbc 9.4; hgb 8.6; hct 26.7; mcv 96.4 plt 314; glucose 118; bun 10; creat 0.86; k+ 3.5; na++ 135; ca 7.8; GFR>60; protein 5.4; albumin 2.4  ?04-15-21: hgb a1c  6.0  ? ?Review of Systems  ?Constitutional:  Positive for malaise/fatigue.  ?     Poor entry   ?Respiratory:  Negative for cough and shortness of breath.   ?Cardiovascular:  Negative for chest pain, palpitations and leg swelling.  ?Gastrointestinal:  Negative for abdominal pain, constipation and heartburn.  ?Musculoskeletal:  Negative for back pain, joint pain and myalgias.  ?Skin: Negative.   ?Neurological:  Negative for dizziness.  ?Psychiatric/Behavioral:  The patient is not nervous/anxious.   ? ?Physical Exam ?Constitutional:   ?   General: She is not in acute distress. ?   Appearance: She is well-developed. She is not diaphoretic.  ?Neck:  ?   Thyroid: No thyromegaly.  ?Cardiovascular:  ?   Rate and Rhythm: Normal rate and regular rhythm.  ?  Pulses: Normal pulses.  ?   Heart sounds: Normal heart sounds.  ?Pulmonary:  ?   Effort: Pulmonary effort is normal. No respiratory distress.  ?   Breath sounds: Normal breath sounds.  ?   Comments: 02 ?Abdominal:  ?   General: Bowel sounds are normal. There is no distension.  ?   Palpations: Abdomen is soft.  ?   Tenderness: There is no abdominal tenderness.  ?Musculoskeletal:     ?   General: Normal range of motion.  ?   Cervical back: Neck supple.  ?   Right lower leg: No edema.  ?   Left lower leg: No edema.  ?Lymphadenopathy:  ?   Cervical: No cervical adenopathy.  ?Skin: ?   General: Skin is warm and dry.  ?Neurological:  ?   Mental Status: She is alert and oriented to person, place, and time.  ? ? ? ?ASSESSMENT/ PLAN: ? ?TODAY ? ?Sepsis with acute hypoxic respiratory failure without septic shock due to unspecified organism/diverticulitis: has improved; will complete augmentin and will monitor her status.  ? ?2. Gastroesophageal reflux disease without esophagitis: is stable will continue protonix 40 mg daily  ? ?3.  Essential hypertension: stable b/p 140/72; will continue cozaar 100 mg daily  ? ?4. Acute respiratory failure with hypoxia: is on 02 at 2liter will wean  as tolerated ? ?5. Hypothyroidism unspecified type: tsh 3.532 will monitor  ? ?6. Stage 3a chronic kidney disease/AKI (acute kidney injury); bun 10; creat 0.86; GFR >60 will monitor ? ?7. Mixed hyperlipidemia:

## 2021-04-17 ENCOUNTER — Non-Acute Institutional Stay (SKILLED_NURSING_FACILITY): Payer: Self-pay | Admitting: Internal Medicine

## 2021-04-17 ENCOUNTER — Encounter: Payer: Self-pay | Admitting: Internal Medicine

## 2021-04-17 DIAGNOSIS — E46 Unspecified protein-calorie malnutrition: Secondary | ICD-10-CM | POA: Insufficient documentation

## 2021-04-17 DIAGNOSIS — D649 Anemia, unspecified: Secondary | ICD-10-CM

## 2021-04-17 DIAGNOSIS — N179 Acute kidney failure, unspecified: Secondary | ICD-10-CM

## 2021-04-17 DIAGNOSIS — R0689 Other abnormalities of breathing: Secondary | ICD-10-CM

## 2021-04-17 DIAGNOSIS — K5792 Diverticulitis of intestine, part unspecified, without perforation or abscess without bleeding: Secondary | ICD-10-CM

## 2021-04-17 DIAGNOSIS — R7301 Impaired fasting glucose: Secondary | ICD-10-CM

## 2021-04-17 DIAGNOSIS — E440 Moderate protein-calorie malnutrition: Secondary | ICD-10-CM

## 2021-04-17 NOTE — Assessment & Plan Note (Signed)
See exam 04/17/2021 sticky rales heard at the bases, right greater than left.  This is in the context of subjective nonproductive cough which is improving.  Also she describes community-acquired pneumonia in 2020 and COVID infection in 2021.  Portable chest x-ray revealed no active disease.  CT of abdomen/pelvis did reveal mild bibasilar atelectasis but no fibrosis. ?O2 sats 95% on room air. ?Incentive spirometry recommended. ?

## 2021-04-17 NOTE — Assessment & Plan Note (Signed)
Glucose range while hospitalized with diverticulitis was 82 up to 126.  A1c is 6% indicating prediabetes. ?

## 2021-04-17 NOTE — Progress Notes (Signed)
? ?NURSING HOME LOCATION:  [place] ?ROOM NUMBER:  133 P ? ?CODE STATUS:  Full Code ? ?PCP:  Celene Squibb MD ? ?This is a comprehensive admission note to this SNFperformed on this date less than 30 days from date of admission. ?Included are preadmission medical/surgical history; reconciled medication list; family history; social history and comprehensive review of systems.  ?Corrections and additions to the records were documented. Comprehensive physical exam was also performed. Additionally a clinical summary was entered for each active diagnosis pertinent to this admission in the Problem List to enhance continuity of care. ? ?HPI: Patient was hospitalized 3/4 - 04/15/2021 presenting with abdominal pain, vomiting, frequent stools, and clinical dehydration.  Diverticulitis involving the ascending colon was documented on CT imaging.  Course was complicated by AKI.  Baseline creatinine was felt to be 1.0.  Creatinine peaked at 1.67 with a GFR of 31 indicating CKD low stage IIIb.  Incidental finding was right renal cyst on CT which was not documented on renal ultrasound.  Hypoxia was documented in the context of possible aspiration.  Chest x-ray revealed no active process ; but CT of the abdomen/pelvis did show bibasilar atelectasis to a mild degree.  No fibrosis was documented.  White blood count was normal; lactic acid level was 1.8 and procalcitonin 1.15.  She was treated with IV fluid resuscitation and empiric antibiotic therapy. ?Clinical improvement in the diverticulitis, renal function, oral intake resulted.  Antibiotics were transitioned to oral Augmentin to complete 10-day course.  Mild anemia was present at admission with hemoglobin 11.8; this trended down to approximately 8.  Component of hemodilution with IV rehydration was clinically felt to be present.  FOBT was negative and there was no bleeding dyscrasia reported. ?PT/OT consulted for generalized weakness which improved clinically.   Placement at SNF for rehab was recommended. ? ?Past medical and surgical history: Includes history of GERD, hypothyroidism, impaired fasting glucose, and dyslipidemia. ?Surgeries and procedures include colonoscopy with polypectomy, left lobe thyroidectomy, EGD, Maloney dilation, TAH with BSO, and rectocele repair. ? ?Social history: Nondrinker; non-smoker. ? ?Family history: Noncontributory due to advanced age. ?  ?Review of systems: She states that for a couple nights PTA she had diarrhea followed by the nausea and vomiting.  She also described chills and lower extremity weakness.  This is in the context of chronic back issues. ?She has had injections on 3 occasions for degenerative disc disease.  She also has cervical spinal stenosis and bulging disc. ?She does describe some stool incontinence. ?She stated that she has had a nonproductive cough which is improving.  This is in the context of having had pneumonia in 2020 and COVID infection in 2021. ? ?Constitutional: No fever, significant weight change  ?Eyes: No redness, discharge, pain, vision change ?ENT/mouth: No nasal congestion, purulent discharge, earache, change in hearing, sore throat  ?Cardiovascular: No chest pain, palpitations, paroxysmal nocturnal dyspnea, claudication, edema  ?Respiratory: No  sputum production, hemoptysis, significant snoring, apnea Gastrointestinal: Presently no heartburn, dysphagia, abdominal pain, nausea /vomiting, rectal bleeding, melena, new change in bowels ?Genitourinary: No dysuria, hematuria, pyuria, incontinence, nocturia ?Dermatologic: No rash, pruritus, change in appearance of skin ?Neurologic: No dizziness, headache, syncope, seizures ?Psychiatric: No significant anxiety, depression, insomnia, anorexia ?Endocrine: No change in hair/skin/nails, excessive thirst, excessive hunger, excessive urination  ?Hematologic/lymphatic: No significant bruising, lymphadenopathy, abnormal bleeding ?Allergy/immunology: No itchy/watery  eyes, significant sneezing, urticaria, angioedema ? ?Physical exam:  ?Pertinent or positive findings: Hair is disheveled.  Voice is weak.  Eyebrows  are decreased laterally.  Oral hygiene is immaculate.  Heart sounds are distant.  She has sticky rales in the right lower lobe greater than the left.  Pedal pulses are decreased.  She has trace edema at the sock line.  Slight clubbing of the nailbeds is suggested. ? ?General appearance: Adequately nourished; no acute distress, increased work of breathing is present.   ?Lymphatic: No lymphadenopathy about the head, neck, axilla. ?Eyes: No conjunctival inflammation or lid edema is present. There is no scleral icterus. ?Ears:  External ear exam shows no significant lesions or deformities.   ?Nose:  External nasal examination shows no deformity or inflammation. Nasal mucosa are pink and moist without lesions, exudates ?Oral exam: Lips and gums are healthy appearing.There is no oropharyngeal erythema or exudate. ?Neck:  No thyromegaly, masses, tenderness noted.    ?Heart:  No gallop, murmur, click, rub.  ?Lungs:  without wheezes, rhonchi, rubs. ?Abdomen: Bowel sounds are normal.  Abdomen is soft and nontender with no organomegaly, hernias, masses. ?GU: Deferred  ?Extremities:  No cyanosis. ?Neurologic exam: Balance, Rhomberg, finger to nose testing could not be completed due to clinical state ?Skin: Warm & dry w/o tenting. ?No significant lesions or rash. ? ?See clinical summary under each active problem in the Problem List with associated updated therapeutic plan ? ?

## 2021-04-17 NOTE — Assessment & Plan Note (Signed)
Current albumin 2.4 and total protein 5.4. ?Nutrition consult at SNF. ?

## 2021-04-17 NOTE — Patient Instructions (Signed)
See assessment and plan under each diagnosis in the problem list and acutely for this visit 

## 2021-04-17 NOTE — Assessment & Plan Note (Addendum)
During hospitalization for diverticulitis the anemia progressed w/o bleeding dyscrasias documented. Admission hemoglobin 11.8; post rehydration H/H 8.6/26.7.  FOBT negative. Monitor for progression at SNF. ?

## 2021-04-17 NOTE — Assessment & Plan Note (Addendum)
Hospitalization for acute diverticulitis complicated by AKI.  Creatinine peaked at 1.67 with GFR 31.  At discharge creat 0.86 w GFR > 60 indicating CKD stage II.  High-dose ARB will be weaned or discontinued if there is significant progression of CKD. ?

## 2021-04-18 ENCOUNTER — Other Ambulatory Visit (HOSPITAL_COMMUNITY): Payer: Self-pay | Admitting: Neurosurgery

## 2021-04-19 ENCOUNTER — Non-Acute Institutional Stay (SKILLED_NURSING_FACILITY): Payer: Self-pay | Admitting: Adult Health

## 2021-04-19 ENCOUNTER — Encounter: Payer: Self-pay | Admitting: Adult Health

## 2021-04-19 DIAGNOSIS — B3731 Acute candidiasis of vulva and vagina: Secondary | ICD-10-CM

## 2021-04-22 ENCOUNTER — Other Ambulatory Visit (HOSPITAL_COMMUNITY)
Admission: RE | Admit: 2021-04-22 | Discharge: 2021-04-22 | Disposition: A | Discharge status: Home / Self Care | Payer: Medicare Other | Source: Skilled Nursing Facility | Attending: Adult Health | Admitting: Adult Health

## 2021-04-22 DIAGNOSIS — B3731 Acute candidiasis of vulva and vagina: Secondary | ICD-10-CM | POA: Insufficient documentation

## 2021-04-22 DIAGNOSIS — I1310 Hypertensive heart and chronic kidney disease without heart failure, with stage 1 through stage 4 chronic kidney disease, or unspecified chronic kidney disease: Secondary | ICD-10-CM | POA: Insufficient documentation

## 2021-04-22 LAB — BASIC METABOLIC PANEL
Anion gap: 8 (ref 5–15)
BUN: 19 mg/dL (ref 8–23)
CO2: 25 mmol/L (ref 22–32)
Calcium: 9 mg/dL (ref 8.9–10.3)
Chloride: 105 mmol/L (ref 98–111)
Creatinine, Ser: 0.97 mg/dL (ref 0.44–1.00)
GFR, Estimated: 59 mL/min — ABNORMAL LOW (ref >60)
Glucose, Bld: 84 mg/dL (ref 70–99)
Potassium: 4.6 mmol/L (ref 3.5–5.1)
Sodium: 138 mmol/L (ref 135–145)

## 2021-04-22 LAB — HEMOGLOBIN AND HEMATOCRIT, BLOOD
HCT: 31 % — ABNORMAL LOW (ref 36.0–46.0)
Hemoglobin: 9.5 g/dL — ABNORMAL LOW (ref 12.0–15.0)

## 2021-04-22 NOTE — Progress Notes (Signed)
? ?Location:  [facility] ?Nursing Home Room Number: 133-P ?Place of Service:  SNF (31) ? ? ?CODE STATUS: full  ? ?Allergies  ?Allergen Reactions  ? Bee Venom Anaphylaxis  ? ? ?Chief Complaint  ?Patient presents with  ? Acute Visit  ?  Edema  ? ? ?HPI: ? ?The staff is concerned about her edema. She states that the edema is "old" and "gets worse as she keeps her legs down". She does have any shortness of breath; no cough; no wheezing present. She is complaining of a vaginal yeast infection stating that she gets after abt.  ? ?Past Medical History:  ?Diagnosis Date  ? CAP (community acquired pneumonia) 05/2018  ? Diverticulitis   ? CT verified in 2015, 2018, 2019, 2020  ? GERD (gastroesophageal reflux disease)   ? Hypothyroidism   ? Impaired fasting glucose   ? Mixed hyperlipidemia   ? ? ?Past Surgical History:  ?Procedure Laterality Date  ? COLONOSCOPY    ? COLONOSCOPY N/A 12/12/2015  ? Dr. Gala Romney: one 3 mm tubular adenoma in rectum s/p removal. Diverticulosis in entire colon. Internal hemorrhoids.   ? ESOPHAGOGASTRODUODENOSCOPY N/A 03/19/2016  ? Dr. Gala Romney: LA Grade A esophagitis, empiric dilation, normal stomach, normal duodenum  ? Left lobe thyroidectomy    ? MALONEY DILATION N/A 03/19/2016  ? Procedure: MALONEY DILATION;  Surgeon: Daneil Dolin, MD;  [facility];  Service: Endoscopy;  Laterality: N/A;  ? POLYPECTOMY  12/12/2015  ? Procedure: POLYPECTOMY;  Surgeon: Daneil Dolin, MD;  [facility];  Service: Endoscopy;;  colon  ? RECTOCELE REPAIR    ? TOTAL ABDOMINAL HYSTERECTOMY W/ BILATERAL SALPINGOOPHORECTOMY    ? ? ?Social History  ? ?Socioeconomic History  ? Marital status: Married  ?  Spouse name: Not on file  ? Number of children: Not on file  ? Years of education: Not on file  ? Highest education level: Not on file  ?Occupational History  ? Not on file  ?Tobacco Use  ? Smoking status: Never  ? Smokeless tobacco: Never  ?Vaping Use  ? Vaping Use: Never used  ?Substance and Sexual  Activity  ? Alcohol use: No  ? Drug use: No  ? Sexual activity: Not on file  ?Other Topics Concern  ? Not on file  ?Social History Narrative  ? Not on file  ? ?Social Determinants of Health  ? ?Financial Resource Strain: Not on file  ?Food Insecurity: Not on file  ?Transportation Needs: Not on file  ?Physical Activity: Not on file  ?Stress: Not on file  ?Social Connections: Not on file  ?Intimate Partner Violence: Not on file  ? ?Family History  ?Problem Relation Age of Onset  ? CAD Brother   ?     2 brothers with CAD in their 72s  ? Crohn's disease Brother   ?     had colon resection  ? Pancreatic cancer Brother   ? CAD Father   ? Heart failure Mother   ? Diabetes Mellitus II Mother   ? Colon cancer Neg Hx   ? ? ? ? ?VITAL SIGNS ?BP 134/72   Pulse 96   Temp (!) 97.3 ?F (36.3 ?C)   Resp 20   Ht '5\' 1"'$  (1.549 m)   Wt 154 lb 3.2 oz (69.9 kg)   SpO2 99%   BMI 29.14 kg/m?  ? ?Outpatient Encounter Medications as of 04/19/2021  ?Medication Sig  ? acetaminophen (TYLENOL) 500 MG tablet Take 1  tablet (500 mg total) by mouth every 6 (six) hours as needed.  ? Amino Acids-Protein Hydrolys (FEEDING SUPPLEMENT, PRO-STAT SUGAR FREE 64,) LIQD (amino acids-protein hydrolys)  ?liquid; 15-100 gram-kcal/30 mL; amt: 30 mL; oral ?Special Instructions: albumin 2.4 ?With Meals  ? Balsam [place]-Cast[place] Oil (VENELEX) OINT topical ?Special Instructions: Apply to bilateral buttocks and sacrum/coccyx qshift ?Every Shift; Day  ? cetirizine (ZYRTEC) 10 MG tablet Take 10 mg by mouth daily.  ? losartan (COZAAR) 100 MG tablet Take 100 mg by mouth daily.  ? Melatonin 10 MG TABS Take by mouth.  ? mirtazapine (REMERON) 7.5 MG tablet Take 7.5 mg by mouth at bedtime.  ? Nutritional Supplements (ENSURE ENLIVE PO) BID due to poor appetite x 2 weeks PTA, increased malnutrition  ?risk. Poor meal intake 25-50%. ?Special Instructions: Please provide either vanilla or strawberry. Resident doesn't ?like chocolate. ?Twice A Day Between Meals  ? OXYGEN 2L/mn via  [place]  ?Special Instructions: Goal is to wean as tolerated for sats>92%. ?Every Shift  ? simvastatin (ZOCOR) 40 MG tablet Take 40 mg by mouth at bedtime.   ? pantoprazole (PROTONIX) 40 MG tablet TAKE ONE TABLET BY MOUTH ONCE DAILY. (Patient taking differently: Take 40 mg by mouth daily.)  ? ?No facility-administered encounter medications on file as of 04/19/2021.  ? ? ? ?SIGNIFICANT DIAGNOSTIC EXAMS ? ?PREVIOUS  ? ?04-06-21: chest x-ray: No acute cardiopulmonary abnormality.  ? ?04-06-21: ct of abdomen and pelvis ?1. Findings consistent with mild acute diverticulitis involving the ascending colon. ?2. Findings which may represent a hemorrhagic right renal cyst.Correlation with nonemergent renal ultrasound is recommended. ?3. Mild bibasilar atelectasis. ?4. Aortic atherosclerosis ? ?04-07-21: renal ultrasound ?1. No hydronephrosis. ?2. Mildly hyperdense focus noted on recent CT is not definitively visualized sonographically. Consider follow-up renal cyst protocol ?CT in 6 months. ? ?04-10-21: ct angio of chest:  ?1. No CT findings for pulmonary embolism. ?2. Normal caliber thoracic aorta without aneurysm or dissection. Moderate scattered atherosclerotic calcifications. ?3. Small bilateral pleural effusions with overlying atelectasis and patchy lower lobe infiltrates. Aspiration would be a consideration. ?4. Diffuse fatty infiltration of the liver. ?5. Aortic atherosclerosis. ? ?NO NEW EXAMS  ? ?LABS REVIEWED PREVIOUS  ? ?04-06-21: wbc 7.8; hgb 11.8; hct 38.3 mcv 95.5 plt 296; glucose 95; bun 32; creat 1.67; k+ 5.7; na++ 131; ca 9.5; GFR 31; protein 8.1; albumin 3.9 blood/urine no growth ?04-09-21: wbc 7.7; hgb 8.6; hct 26.1; mcv 97; plt 256; glucose 82; bun 18; creat 1.10; k+ 4.4; na++ 131; ca 8.0; GFR 50; protein 5.6; albumin 2.4 ?04-12-21: tsh 3.532 ?04-13-21: wbc 9.4; hgb 8.6; hct 26.7; mcv 96.4 plt 314; glucose 118; bun 10; creat 0.86; k+ 3.5; na++ 135; ca 7.8; GFR>60; protein 5.4; albumin 2.4  ?04-15-21: hgb a1c 6.0  ? ?NO NEW  LABS.  ? ?Physical Exam ?Constitutional:   ?   General: She is not in acute distress. ?   Appearance: She is well-developed. She is not diaphoretic.  ?Neck:  ?   Thyroid: No thyromegaly.  ?Cardiovascular:  ?   Rate and Rhythm: Normal rate and regular rhythm.  ?   Pulses: Normal pulses.  ?   Heart sounds: Normal heart sounds.  ?Pulmonary:  ?   Effort: Pulmonary effort is normal. No respiratory distress.  ?   Breath sounds: Normal breath sounds.  ?Abdominal:  ?   General: Bowel sounds are normal. There is no distension.  ?   Palpations: Abdomen is soft.  ?   Tenderness: There is no  abdominal tenderness.  ?Genitourinary: ?   Comments: Has vaginal drainage present.  ?Musculoskeletal:     ?   General: Normal range of motion.  ?   Cervical back: Neck supple.  ?   Right lower leg: No edema.  ?   Left lower leg: No edema.  ?Lymphadenopathy:  ?   Cervical: No cervical adenopathy.  ?Skin: ?   General: Skin is warm and dry.  ?Neurological:  ?   Mental Status: She is alert and oriented to person, place, and time.  ?Psychiatric:     ?   Mood and Affect: Mood normal.  ? ? ?ASSESSMENT/ PLAN: ? ?TODAY ? ?Vaginal yeast infection: will begin diflucan 150 mg today and repeat in 72 hours  ? ? ?Ok Edwards NP ?[facility]  ?call [phone]  ? ?

## 2021-04-24 ENCOUNTER — Encounter: Payer: Self-pay | Admitting: Adult Health

## 2021-04-24 ENCOUNTER — Other Ambulatory Visit: Payer: Self-pay | Admitting: Adult Health

## 2021-04-24 ENCOUNTER — Non-Acute Institutional Stay (SKILLED_NURSING_FACILITY): Payer: Medicare Other | Admitting: Adult Health

## 2021-04-24 DIAGNOSIS — N1831 Chronic kidney disease, stage 3a: Secondary | ICD-10-CM | POA: Diagnosis not present

## 2021-04-24 DIAGNOSIS — J9601 Acute respiratory failure with hypoxia: Secondary | ICD-10-CM | POA: Diagnosis not present

## 2021-04-24 DIAGNOSIS — N179 Acute kidney failure, unspecified: Secondary | ICD-10-CM | POA: Diagnosis not present

## 2021-04-24 DIAGNOSIS — A419 Sepsis, unspecified organism: Secondary | ICD-10-CM

## 2021-04-24 DIAGNOSIS — R6520 Severe sepsis without septic shock: Secondary | ICD-10-CM

## 2021-04-24 MED ORDER — MIRTAZAPINE 7.5 MG PO TABS: 7.5000 mg | ORAL_TABLET | Freq: Every day | ORAL | 0 refills | Status: AC

## 2021-04-24 MED ORDER — OMEPRAZOLE 40 MG PO CPDR: 40.0000 mg | DELAYED_RELEASE_CAPSULE | Freq: Every day | ORAL | 0 refills | Status: DC

## 2021-04-24 MED ORDER — SIMVASTATIN 40 MG PO TABS: 40.0000 mg | ORAL_TABLET | Freq: Every day | ORAL | 0 refills | Status: AC

## 2021-04-24 MED ORDER — LOSARTAN POTASSIUM 100 MG PO TABS
100.0000 mg | ORAL_TABLET | Freq: Every day | ORAL | 0 refills | Status: DC
Start: 2021-04-24 — End: 2023-06-21

## 2021-04-24 NOTE — Progress Notes (Signed)
? ?Location:  [facility] ?Nursing Home Room Number: 133-P ?Place of Service:  SNF (31) ? ?Provider: Ok Edwards np  ? ?PCP: Celene Squibb, MD ?Patient Care Team: ?Celene Squibb, MD as PCP - General (Internal Medicine) ?Satira Sark, MD as Consulting Physician (Cardiology) ?Rourk, Cristopher Estimable, MD as Consulting Physician (Gastroenterology) ? ?Extended Emergency Contact Information ?Primary Emergency Contact: Sweaney,Charlie ?Address: [address]         [place], [place] [address] [place] ?Home Phone: [phone] ?Mobile Phone: [phone] ?Relation: Spouse ? ?Code Status: full  ?Goals of care:  Advanced Directive information ? ?  04/24/2021  ?  9:12 AM  ?Advanced Directives  ?Does Patient Have a Medical Advance Directive? No  ?Does patient want to make changes to medical advance directive? No - Patient declined  ? ? ? ?Allergies  ?Allergen Reactions  ? Bee Venom Anaphylaxis  ? ? ?Chief Complaint  ?Patient presents with  ? Discharge Note  ?  Discharge from [facility]   ? ? ?HPI:  ?81 y.o. female  being discharged to home with home health for pt/ot. She will need a off set single point cane. She will need her prescriptions written and will need to follow up  with her medical provider. She had been hospitalized for acute diverticulitis; acute kindey injury and sepsis. She was admitted to this facility for short term rehab. She has participated in pt/ot to improve upon her independence with her adls; she is now ready for discharge to home.  ? ? ? ?Past Medical History:  ?Diagnosis Date  ? CAP (community acquired pneumonia) 05/2018  ? Diverticulitis   ? CT verified in 2015, 2018, 2019, 2020  ? GERD (gastroesophageal reflux disease)   ? Hypothyroidism   ? Impaired fasting glucose   ? Mixed hyperlipidemia   ? ? ?Past Surgical History:  ?Procedure Laterality Date  ? COLONOSCOPY    ? COLONOSCOPY N/A 12/12/2015  ? Dr. Gala Romney: one 3 mm tubular adenoma in rectum s/p removal. Diverticulosis in  entire colon. Internal hemorrhoids.   ? ESOPHAGOGASTRODUODENOSCOPY N/A 03/19/2016  ? Dr. Gala Romney: LA Grade A esophagitis, empiric dilation, normal stomach, normal duodenum  ? Left lobe thyroidectomy    ? MALONEY DILATION N/A 03/19/2016  ? Procedure: MALONEY DILATION;  Surgeon: Daneil Dolin, MD;  [facility];  Service: Endoscopy;  Laterality: N/A;  ? POLYPECTOMY  12/12/2015  ? Procedure: POLYPECTOMY;  Surgeon: Daneil Dolin, MD;  [facility];  Service: Endoscopy;;  colon  ? RECTOCELE REPAIR    ? TOTAL ABDOMINAL HYSTERECTOMY W/ BILATERAL SALPINGOOPHORECTOMY    ? ? ?  reports that she has never smoked. She has never used smokeless tobacco. She reports that she does not drink alcohol and does not use drugs. ?Social History  ? ?Socioeconomic History  ? Marital status: Married  ?  Spouse name: Not on file  ? Number of children: Not on file  ? Years of education: Not on file  ? Highest education level: Not on file  ?Occupational History  ? Not on file  ?Tobacco Use  ? Smoking status: Never  ? Smokeless tobacco: Never  ?Vaping Use  ? Vaping Use: Never used  ?Substance and Sexual Activity  ? Alcohol use: No  ? Drug use: No  ? Sexual activity: Not on file  ?Other Topics Concern  ? Not on file  ?Social History Narrative  ? Not on file  ? ?  Social Determinants of Health  ? ?Financial Resource Strain: Not on file  ?Food Insecurity: Not on file  ?Transportation Needs: Not on file  ?Physical Activity: Not on file  ?Stress: Not on file  ?Social Connections: Not on file  ?Intimate Partner Violence: Not on file  ? ?Functional Status Survey: ?  ? ?Allergies  ?Allergen Reactions  ? Bee Venom Anaphylaxis  ? ? ?Pertinent  Health Maintenance Due  ?Topic Date Due  ? DEXA SCAN  Never done  ? INFLUENZA VACCINE  Completed  ? ? ?Medications: ?Outpatient Encounter Medications as of 04/24/2021  ?Medication Sig  ? acetaminophen (TYLENOL) 500 MG tablet Take 1 tablet (500 mg total) by mouth every 6 (six) hours as needed.  ?  Balsam [place], materials (VENELEX) OINT topical ?Special Instructions: Apply to bilateral buttocks and sacrum/coccyx qshift ?Every Shift; Day  ? cetirizine (ZYRTEC) 10 MG tablet Take 10 mg by mouth daily.  ? Melatonin 10 MG TABS Take by mouth.  ? Nutritional Supplements (ENSURE ENLIVE PO) BID due to poor appetite x 2 weeks PTA, increased malnutrition  ?risk. Poor meal intake 25-50%. ?Special Instructions: Please provide either vanilla or strawberry. Resident doesn't ?like chocolate. ?Twice A Day Between Meals  ? OXYGEN 2L/mn via [place]  ?Special Instructions: Goal is to wean as tolerated for sats>92%. ?Every Shift  ? [DISCONTINUED] losartan (COZAAR) 100 MG tablet Take 100 mg by mouth daily.  ? [DISCONTINUED] mirtazapine (REMERON) 7.5 MG tablet Take 7.5 mg by mouth at bedtime.  ? [DISCONTINUED] omeprazole (PRILOSEC) 40 MG capsule Take 40 mg by mouth daily.  ? [DISCONTINUED] simvastatin (ZOCOR) 40 MG tablet Take 40 mg by mouth at bedtime.   ? [DISCONTINUED] Amino Acids-Protein Hydrolys (FEEDING SUPPLEMENT, PRO-STAT SUGAR FREE 64,) LIQD (amino acids-protein hydrolys)  ?liquid; 15-100 gram-kcal/30 mL; amt: 30 mL; oral ?Special Instructions: albumin 2.4 ?With Meals  ? [DISCONTINUED] pantoprazole (PROTONIX) 40 MG tablet TAKE ONE TABLET BY MOUTH ONCE DAILY. (Patient taking differently: Take 40 mg by mouth daily.)  ? ?No facility-administered encounter medications on file as of 04/24/2021.  ? ?Vitals:  ? 04/24/21 0904  ?BP: (!) 128/50  ?Pulse: 82  ?Resp: 20  ?Temp: (!) 97.1 ?F (36.2 ?C)  ?SpO2: 94%  ?Weight: 153 lb (69.4 kg)  ?Height: '5\' 1"'$  (1.549 m)  ? ?Body mass index is 28.91 kg/m?. ? ? ?PREVIOUS  ? ?04-06-21: chest x-ray: No acute cardiopulmonary abnormality.  ? ?04-06-21: ct of abdomen and pelvis ?1. Findings consistent with mild acute diverticulitis involving the ascending colon. ?2. Findings which may represent a hemorrhagic right renal cyst.Correlation with nonemergent renal ultrasound is recommended. ?3. Mild bibasilar  atelectasis. ?4. Aortic atherosclerosis ? ?04-07-21: renal ultrasound ?1. No hydronephrosis. ?2. Mildly hyperdense focus noted on recent CT is not definitively visualized sonographically. Consider follow-up renal cyst protocol ?CT in 6 months. ? ?04-10-21: ct angio of chest:  ?1. No CT findings for pulmonary embolism. ?2. Normal caliber thoracic aorta without aneurysm or dissection. Moderate scattered atherosclerotic calcifications. ?3. Small bilateral pleural effusions with overlying atelectasis and patchy lower lobe infiltrates. Aspiration would be a consideration. ?4. Diffuse fatty infiltration of the liver. ?5. Aortic atherosclerosis. ? ?NO NEW EXAMS  ? ?LABS REVIEWED PREVIOUS  ? ?04-06-21: wbc 7.8; hgb 11.8; hct 38.3 mcv 95.5 plt 296; glucose 95; bun 32; creat 1.67; k+ 5.7; na++ 131; ca 9.5; GFR 31; protein 8.1; albumin 3.9 blood/urine no growth ?04-09-21: wbc 7.7; hgb 8.6; hct 26.1; mcv 97; plt 256; glucose 82; bun 18; creat 1.10; k+ 4.4;  na++ 131; ca 8.0; GFR 50; protein 5.6; albumin 2.4 ?04-12-21: tsh 3.532 ?04-13-21: wbc 9.4; hgb 8.6; hct 26.7; mcv 96.4 plt 314; glucose 118; bun 10; creat 0.86; k+ 3.5; na++ 135; ca 7.8; GFR>60; protein 5.4; albumin 2.4  ?04-15-21: hgb a1c 6.0  ? ?NO NEW LABS.  ? ?Review of Systems  ?Constitutional:  Negative for malaise/fatigue.  ?Respiratory:  Negative for cough and shortness of breath.   ?Cardiovascular:  Negative for chest pain, palpitations and leg swelling.  ?Gastrointestinal:  Negative for abdominal pain, constipation and heartburn.  ?Musculoskeletal:  Negative for back pain, joint pain and myalgias.  ?Skin: Negative.   ?Neurological:  Negative for dizziness.  ?Psychiatric/Behavioral:  The patient is not nervous/anxious.   ? ?Physical Exam ?Constitutional:   ?   General: She is not in acute distress. ?   Appearance: She is well-developed. She is not diaphoretic.  ?Neck:  ?   Thyroid: No thyromegaly.  ?Cardiovascular:  ?   Rate and Rhythm: Normal rate and regular rhythm.  ?    Pulses: Normal pulses.  ?   Heart sounds: Normal heart sounds.  ?Pulmonary:  ?   Effort: Pulmonary effort is normal. No respiratory distress.  ?   Breath sounds: Normal breath sounds.  ?Abdominal:  ?   General: B

## 2021-06-05 ENCOUNTER — Encounter (HOSPITAL_COMMUNITY): Payer: Self-pay | Admitting: *Deleted

## 2021-06-05 ENCOUNTER — Other Ambulatory Visit: Payer: Self-pay

## 2021-06-05 ENCOUNTER — Ambulatory Visit: Payer: Medicare Other | Admitting: Gastroenterology

## 2021-06-05 ENCOUNTER — Emergency Department (HOSPITAL_COMMUNITY): Payer: Medicare Other

## 2021-06-05 ENCOUNTER — Emergency Department (HOSPITAL_COMMUNITY)
Admission: EM | Admit: 2021-06-05 | Discharge: 2021-06-05 | Disposition: A | Discharge status: Home / Self Care | Payer: Medicare Other | Attending: Emergency Medicine | Admitting: Emergency Medicine

## 2021-06-05 DIAGNOSIS — R1032 Left lower quadrant pain: Secondary | ICD-10-CM | POA: Diagnosis present

## 2021-06-05 DIAGNOSIS — E860 Dehydration: Secondary | ICD-10-CM | POA: Insufficient documentation

## 2021-06-05 DIAGNOSIS — I129 Hypertensive chronic kidney disease with stage 1 through stage 4 chronic kidney disease, or unspecified chronic kidney disease: Secondary | ICD-10-CM | POA: Diagnosis not present

## 2021-06-05 DIAGNOSIS — K529 Noninfective gastroenteritis and colitis, unspecified: Secondary | ICD-10-CM | POA: Diagnosis not present

## 2021-06-05 DIAGNOSIS — N183 Chronic kidney disease, stage 3 unspecified: Secondary | ICD-10-CM | POA: Diagnosis not present

## 2021-06-05 DIAGNOSIS — Z79899 Other long term (current) drug therapy: Secondary | ICD-10-CM | POA: Diagnosis not present

## 2021-06-05 LAB — COMPREHENSIVE METABOLIC PANEL
ALT: 13 U/L (ref 0–44)
AST: 18 U/L (ref 15–41)
Albumin: 4 g/dL (ref 3.5–5.0)
Alkaline Phosphatase: 62 U/L (ref 38–126)
Anion gap: 15 (ref 5–15)
BUN: 24 mg/dL — ABNORMAL HIGH (ref 8–23)
CO2: 17 mmol/L — ABNORMAL LOW (ref 22–32)
Calcium: 9.6 mg/dL (ref 8.9–10.3)
Chloride: 105 mmol/L (ref 98–111)
Creatinine, Ser: 1.38 mg/dL — ABNORMAL HIGH (ref 0.44–1.00)
GFR, Estimated: 38 mL/min — ABNORMAL LOW (ref >60)
Glucose, Bld: 94 mg/dL (ref 70–99)
Potassium: 4.3 mmol/L (ref 3.5–5.1)
Sodium: 137 mmol/L (ref 135–145)
Total Bilirubin: 0.6 mg/dL (ref 0.3–1.2)
Total Protein: 8.2 g/dL — ABNORMAL HIGH (ref 6.5–8.1)

## 2021-06-05 LAB — URINALYSIS, ROUTINE W REFLEX MICROSCOPIC
Bacteria, UA: NONE SEEN
Bilirubin Urine: NEGATIVE
Glucose, UA: NEGATIVE mg/dL
Ketones, ur: 20 mg/dL — AB
Nitrite: NEGATIVE
Protein, ur: NEGATIVE mg/dL
Specific Gravity, Urine: 1.019 (ref 1.005–1.030)
pH: 6 (ref 5.0–8.0)

## 2021-06-05 LAB — CBC
HCT: 40.4 % (ref 36.0–46.0)
Hemoglobin: 12.1 g/dL (ref 12.0–15.0)
MCH: 29.3 pg (ref 26.0–34.0)
MCHC: 30 g/dL (ref 30.0–36.0)
MCV: 97.8 fL (ref 80.0–100.0)
Platelets: 304 10*3/uL (ref 150–400)
RBC: 4.13 MIL/uL (ref 3.87–5.11)
RDW: 13.7 % (ref 11.5–15.5)
WBC: 5.6 10*3/uL (ref 4.0–10.5)
nRBC: 0 % (ref 0.0–0.2)

## 2021-06-05 LAB — LIPASE, BLOOD: Lipase: 43 U/L (ref 11–51)

## 2021-06-05 MED ORDER — CIPROFLOXACIN HCL 250 MG PO TABS
500.0000 mg | ORAL_TABLET | Freq: Once | ORAL | Status: AC
  Administered 2021-06-05: 500 mg via ORAL
  Filled 2021-06-05: qty 2

## 2021-06-05 MED ORDER — METRONIDAZOLE 500 MG PO TABS: 500.0000 mg | ORAL_TABLET | Freq: Two times a day (BID) | ORAL | 0 refills | Status: AC

## 2021-06-05 MED ORDER — SODIUM CHLORIDE 0.9 % IV BOLUS
1000.0000 mL | Freq: Once | INTRAVENOUS | Status: AC
  Administered 2021-06-05: 1000 mL via INTRAVENOUS

## 2021-06-05 MED ORDER — IOHEXOL 300 MG/ML  SOLN
100.0000 mL | Freq: Once | INTRAMUSCULAR | Status: AC | PRN
  Administered 2021-06-05: 80 mL via INTRAVENOUS

## 2021-06-05 MED ORDER — METRONIDAZOLE 500 MG PO TABS
500.0000 mg | ORAL_TABLET | Freq: Once | ORAL | Status: AC
  Administered 2021-06-05: 500 mg via ORAL
  Filled 2021-06-05: qty 1

## 2021-06-05 MED ORDER — CIPROFLOXACIN HCL 500 MG PO TABS: 500.0000 mg | ORAL_TABLET | Freq: Two times a day (BID) | ORAL | 0 refills | Status: AC

## 2021-06-05 NOTE — ED Triage Notes (Signed)
Pt states she was recently in hospital for diverticulitis and for the last 2 days she has been having abdominal pain with BMs that look like mucous ?

## 2021-06-05 NOTE — ED Notes (Signed)
ED Provider at bedside. 

## 2021-06-05 NOTE — Discharge Instructions (Signed)
Take 1 more dose of these antibiotics this evening.  I also recommended the probiotics taking daily.  Make sure you are avoiding constipation.  I would let the diarrhea run its course, as discussed unless it becomes severe in frequency, defined as more frequent than every 2 hours in a 6-hour period of time.  Make sure you are drinking plenty of fluids.  I recommend a bland diet for the next couple of days, you may bump up to a regular diet once your symptoms are improving. ?

## 2021-06-05 NOTE — ED Provider Notes (Signed)
?[facility] ?Provider Note ? ? ?CSN: [id number] ?Arrival date & time: 06/05/21  5681 ? ?  ? ?History ? ?Chief Complaint  ?Patient presents with  ? Abdominal Pain  ? ? ?Diane Torres is a 81 y.o. female with a history significant for diverticulitis, most recently was admitted for this condition 2 months ago, also history of stage III kidney disease, hyperlipidemia, GERD, hypertension presenting for evaluation of persistent diarrhea and pain in her left lower quadrant.  Her symptoms started 4 days ago and states that she has been unable to take any p.o. intake without triggering a severe episode of explosive diarrhea.  She states she had significant left lower quadrant pain initially but this symptom has greatly improved proved over the past 2 days.  Although she does state she still has some soreness when she presses on that location, the severe pain has resolved.  She denies fevers or chills and has had no nausea or vomiting.  She is seeing no blood in her stools but has been passing water and mucus.  She took Imodium 2 days ago which did not relieve her symptoms.  Of note, she was scheduled to see her gastroenterologist today at 64 but states she could not wait and felt she needed to be in the emergency department. ? ?The history is provided by the patient.  ? ?  ? ?Home Medications ?Prior to Admission medications   ?Medication Sig Start Date End Date Taking? Authorizing Provider  ?acetaminophen (TYLENOL) 500 MG tablet Take 1 tablet (500 mg total) by mouth every 6 (six) hours as needed. 10/29/20  Yes Sheikh, Omair Latif, DO  ?cetirizine (ZYRTEC) 10 MG tablet Take 10 mg by mouth daily. 08/04/19  Yes [provider]  ?ciprofloxacin (CIPRO) 500 MG tablet Take 1 tablet (500 mg total) by mouth every 12 (twelve) hours for 7 days. 06/05/21 06/12/21 Yes Nafeesa Dils, Almyra Free, PA-C  ?losartan (COZAAR) 100 MG tablet Take 1 tablet (100 mg total) by mouth daily. 04/24/21  Yes Gerlene Fee, NP  ?metroNIDAZOLE  (FLAGYL) 500 MG tablet Take 1 tablet (500 mg total) by mouth 2 (two) times daily for 7 days. 06/05/21 06/12/21 Yes Shenae Bonanno, Almyra Free, PA-C  ?pantoprazole (PROTONIX) 40 MG tablet Take 40 mg by mouth daily.   Yes [provider]  ?simvastatin (ZOCOR) 40 MG tablet Take 1 tablet (40 mg total) by mouth at bedtime. 04/24/21  Yes Gerlene Fee, NP  ?mirtazapine (REMERON) 7.5 MG tablet Take 1 tablet (7.5 mg total) by mouth at bedtime for 24 days. ?Patient not taking: Reported on 06/05/2021 04/24/21 05/18/21  Gerlene Fee, NP  ?Nutritional Supplements (ENSURE ENLIVE PO) BID due to poor appetite x 2 weeks PTA, increased malnutrition  ?risk. Poor meal intake 25-50%. ?Special Instructions: Please provide either vanilla or strawberry. Resident doesn't ?like chocolate. ?Twice A Day Between Meals ?Patient not taking: Reported on 06/05/2021    [provider]  ?omeprazole (PRILOSEC) 40 MG capsule Take 1 capsule (40 mg total) by mouth daily. ?Patient not taking: Reported on 06/05/2021 04/24/21   Gerlene Fee, NP  ?   ? ?Allergies    ?Bee venom   ? ?Review of Systems   ?Review of Systems  ?Constitutional:  Positive for fatigue. Negative for chills and fever.  ?HENT: Negative.    ?Eyes: Negative.   ?Respiratory:  Negative for chest tightness and shortness of breath.   ?Cardiovascular:  Negative for chest pain.  ?Gastrointestinal:  Positive for abdominal pain, diarrhea and  vomiting. Negative for abdominal distention and nausea.  ?Genitourinary: Negative.  Negative for dysuria.  ?Musculoskeletal:  Negative for arthralgias, joint swelling and neck pain.  ?Skin: Negative.  Negative for rash and wound.  ?Neurological:  Positive for weakness. Negative for dizziness, light-headedness, numbness and headaches.  ?Psychiatric/Behavioral: Negative.    ? ?Physical Exam ?Updated Vital Signs ?BP (!) 153/61   Pulse 80   Temp 97.7 ?F (36.5 ?C) (Oral)   Resp 16   Ht '5\' 1"'$  (1.549 m)   Wt 64 kg   SpO2 95%   BMI 26.64 kg/m?  ?Physical  Exam ?Vitals and nursing note reviewed.  ?Constitutional:   ?   Appearance: She is well-developed.  ?HENT:  ?   Head: Normocephalic and atraumatic.  ?Eyes:  ?   Conjunctiva/sclera: Conjunctivae normal.  ?Cardiovascular:  ?   Rate and Rhythm: Normal rate and regular rhythm.  ?   Heart sounds: Normal heart sounds.  ?   Comments: Borderline tachycardia with an initial pulse rate of 98. ?Pulmonary:  ?   Effort: Pulmonary effort is normal.  ?   Breath sounds: Normal breath sounds. No wheezing.  ?Abdominal:  ?   General: Bowel sounds are normal.  ?   Palpations: Abdomen is soft.  ?   Tenderness: There is abdominal tenderness in the left lower quadrant. There is no guarding or rebound.  ?Musculoskeletal:     ?   General: Normal range of motion.  ?   Cervical back: Normal range of motion.  ?Skin: ?   General: Skin is warm and dry.  ?Neurological:  ?   Mental Status: She is alert.  ? ? ?ED Results / Procedures / Treatments   ?Labs ?(all labs ordered are listed, but only abnormal results are displayed) ?Labs Reviewed  ?COMPREHENSIVE METABOLIC PANEL - Abnormal; Notable for the following components:  ?    Result Value  ? CO2 17 (*)   ? BUN 24 (*)   ? Creatinine, Ser 1.38 (*)   ? Total Protein 8.2 (*)   ? GFR, Estimated 38 (*)   ? All other components within normal limits  ?URINALYSIS, ROUTINE W REFLEX MICROSCOPIC - Abnormal; Notable for the following components:  ? Color, Urine STRAW (*)   ? Hgb urine dipstick SMALL (*)   ? Ketones, ur 20 (*)   ? Leukocytes,Ua MODERATE (*)   ? All other components within normal limits  ?LIPASE, BLOOD  ?CBC  ? ? ?EKG ?None ? ?Radiology ?CT ABDOMEN PELVIS W CONTRAST ? ?Result Date: 06/05/2021 ?CLINICAL DATA:  Left lower quadrant pain. History of diverticulitis. Mucous in bowel movements. EXAM: CT ABDOMEN AND PELVIS WITH CONTRAST TECHNIQUE: Multidetector CT imaging of the abdomen and pelvis was performed using the standard protocol following bolus administration of intravenous contrast. RADIATION  DOSE REDUCTION: This exam was performed according to the departmental dose-optimization program which includes automated exposure control, adjustment of the mA and/or kV according to patient size and/or use of iterative reconstruction technique. CONTRAST:  85m OMNIPAQUE IOHEXOL 300 MG/ML  SOLN COMPARISON:  04/06/2021 FINDINGS: Lower chest: Clear lung bases. Normal heart size without pericardial or pleural effusion. Right coronary artery calcification. Hepatobiliary: Mild hepatic steatosis, without focal liver lesion. There is focal steatosis adjacent the falciform ligament. Normal gallbladder, without biliary ductal dilatation. Pancreas: Normal, without mass or ductal dilatation. Spleen: Normal in size, without focal abnormality. Adrenals/Urinary Tract: Normal adrenal glands. The interpolar right renal subcentimeter hyperattenuating lesion on the prior CT is too small to characterize including  on 16/7 today. Normal left kidney. No hydronephrosis. Normal urinary bladder. Stomach/Bowel: Portions of the stomach are underdistended. Extensive colonic diverticulosis. Mild colonic wall thickening with areas of mucosal hyperenhancement, including at the rectosigmoid junction on 74/2 and within the cecum on 74/2. Mild diffuse pericolonic edema on the prior exam is significantly improved. Normal terminal ileum and appendix.  Normal small bowel. Vascular/Lymphatic: Advanced aortic and branch vessel atherosclerosis. No abdominopelvic adenopathy. Reproductive: Hysterectomy.  No adnexal mass. Other: No significant free fluid. Moderate pelvic floor laxity. No free intraperitoneal air. Musculoskeletal: Lumbar spondylosis. IMPRESSION: 1. Mild colonic wall thickening with areas of mucosal hyperenhancement, suspicious for infectious or inflammatory colitis. Although there is extensive colonic diverticulosis, colitis is favored over diverticulitis given relatively diffuse appearance. 2. Hepatic steatosis 3. Subcentimeter indeterminate  right renal lesion as on 04/06/2021 CT. Consider definitive characterization with pre and post contrast abdominal MRI at 6-12 months. 4. Coronary artery atherosclerosis. Aortic Atherosclerosis (ICD10-I70.0). Elec

## 2021-06-05 NOTE — ED Notes (Signed)
Patient transported to CT 

## 2021-07-02 ENCOUNTER — Ambulatory Visit: Payer: Medicare Other | Admitting: Internal Medicine

## 2021-07-02 ENCOUNTER — Telehealth: Payer: Self-pay

## 2021-07-02 ENCOUNTER — Encounter: Payer: Self-pay | Admitting: Internal Medicine

## 2021-07-02 ENCOUNTER — Encounter (HOSPITAL_COMMUNITY): Payer: Self-pay | Admitting: Physical Therapy

## 2021-07-02 ENCOUNTER — Other Ambulatory Visit: Payer: Self-pay

## 2021-07-02 VITALS — BP 130/78 | HR 75 | Temp 97.5°F | Ht 61.0 in | Wt 148.0 lb

## 2021-07-02 DIAGNOSIS — K5732 Diverticulitis of large intestine without perforation or abscess without bleeding: Secondary | ICD-10-CM

## 2021-07-02 DIAGNOSIS — K2100 Gastro-esophageal reflux disease with esophagitis, without bleeding: Secondary | ICD-10-CM

## 2021-07-02 MED ORDER — CLENPIQ 10-3.5-12 MG-GM -GM/175ML PO SOLN: 1.0000 | ORAL | 0 refills | Status: DC

## 2021-07-02 NOTE — Therapy (Signed)
[facility] [place], [place], [address] Phone: [phone]   Fax:  [phone]  Patient Details  Name: ZELENE BARGA MRN: [id number] Date of Birth: [birth year] Referring Provider:  No ref. provider found  Encounter Date: 07/02/2021 PHYSICAL THERAPY DISCHARGE SUMMARY  Visits from Start of Care: 4  Current functional level related to goals / functional outcomes: Unknown, pt did not return.   Remaining deficits: Unknown, pt did not return.     Education / Equipment: HEP   Patient agrees to discharge. Patient goals were partially met. Patient is being discharged due to not returning since the last visit.   Clementine Soulliere,CINDY, PT 07/02/2021, 3:43 PM  [facility] [address] [place], [place], [address] Phone: [phone]   Fax:  [phone]

## 2021-07-02 NOTE — Telephone Encounter (Signed)
PA for TCS submitted via [facility] website. PA# [id number], valid 08/21/21-11/19/21.

## 2021-07-02 NOTE — Patient Instructions (Signed)
It was good to see you again today!  Continue taking Protonix 40 mg every day 30 minutes before breakfast  Continue taking Florajen for another 3 months or so then can taper off as desired  Begin Benefiber 1 tablespoon daily  As discussed, we will schedule a diagnostic colonoscopy (abnormal colon on CT).  ASA 3  See Dr. Nevada Crane about getting a kidney MRI in about 6 months as recommended by the radiologist to follow-up on the abnormal spot in your kidney  Further recommendations to follow.

## 2021-07-02 NOTE — Progress Notes (Unsigned)
Primary Care Physician:  Celene Squibb, MD Primary Gastroenterologist:  Dr. Gala Romney  Pre-Procedure History & Physical: HPI:  Diane Torres is a 81 y.o. female here for for further evaluation of recent diverticulitis.  CT back in March demonstrated changes consistent with ascending colon diverticulitis.  She recovered with antibiotics became acutely ill with diarrhea and abdominal pain 1 month ago.  ED evaluation demonstrated some diffuse colonic edema and rectosigmoid wall thickening.  She again was treated with antibiotics.  She is back to baseline at this time.  Of note, both CTs demonstrated abnormalities involving the right kidney in March changes consistent with hemorrhagic cyst and a nonspecific lesion seen 1 month ago.  MRI of the kidneys recommended in about 6 months.  Patient unaware of these findings. Last colonoscopy 2017-rectal adenoma removed and pancolonic diverticulosis; last EGD 2018 mild reflux esophagitis.  Empiric Maloney dilation for dysphagia was associated with resolution of those symptoms.  She is not passing any blood.  She is taking Florajen since she finished antibiotics.  Denies diarrhea. GERD well-controlled on Protonix 40 mg once daily.  Past Medical History:  Diagnosis Date   CAP (community acquired pneumonia) 05/2018   Diverticulitis    CT verified in 2015, 2018, 2019, 2020   GERD (gastroesophageal reflux disease)    Hypothyroidism    Impaired fasting glucose    Mixed hyperlipidemia     Past Surgical History:  Procedure Laterality Date   COLONOSCOPY     COLONOSCOPY N/A 12/12/2015   Dr. Gala Romney: one 3 mm tubular adenoma in rectum s/p removal. Diverticulosis in entire colon. Internal hemorrhoids.    ESOPHAGOGASTRODUODENOSCOPY N/A 03/19/2016   Dr. Gala Romney: LA Grade A esophagitis, empiric dilation, normal stomach, normal duodenum   Left lobe thyroidectomy     MALONEY DILATION N/A 03/19/2016   Procedure: Venia Minks DILATION;  Surgeon: Daneil Dolin, MD;  [facility]: [facility];  Service: Endoscopy;  Laterality: N/A;   POLYPECTOMY  12/12/2015   Procedure: POLYPECTOMY;  Surgeon: Daneil Dolin, MD;  Location: [facility];  Service: Endoscopy;;  colon   RECTOCELE REPAIR     TOTAL ABDOMINAL HYSTERECTOMY W/ BILATERAL SALPINGOOPHORECTOMY      Prior to Admission medications   Medication Sig Start Date End Date Taking? Authorizing Provider  acetaminophen (TYLENOL) 500 MG tablet Take 1 tablet (500 mg total) by mouth every 6 (six) hours as needed. 10/29/20  Yes Sheikh, Omair Latif, DO  cetirizine (ZYRTEC) 10 MG tablet Take 10 mg by mouth daily. 08/04/19  Yes [provider]  losartan (COZAAR) 100 MG tablet Take 1 tablet (100 mg total) by mouth daily. 04/24/21  Yes Gerlene Fee, NP  pantoprazole (PROTONIX) 40 MG tablet Take 40 mg by mouth daily.   Yes [provider]  simvastatin (ZOCOR) 40 MG tablet Take 1 tablet (40 mg total) by mouth at bedtime. 04/24/21  Yes Gerlene Fee, NP    Allergies as of 07/02/2021 - Review Complete 07/02/2021  Allergen Reaction Noted   Bee venom Anaphylaxis 03/18/2016    Family History  Problem Relation Age of Onset   CAD Brother        2 brothers with CAD in their 12s   Crohn's disease Brother        had colon resection   Pancreatic cancer Brother    CAD Father    Heart failure Mother    Diabetes Mellitus II Mother    Colon cancer Neg Hx  Social History   Socioeconomic History   Marital status: Married    Spouse name: Not on file   Number of children: Not on file   Years of education: Not on file   Highest education level: Not on file  Occupational History   Not on file  Tobacco Use   Smoking status: Never   Smokeless tobacco: Never  Vaping Use   Vaping Use: Never used  Substance and Sexual Activity   Alcohol use: No   Drug use: No   Sexual activity: Yes  Other Topics Concern   Not on file  Social History Narrative   Not on file   Social Determinants of Health   Financial  Resource Strain: Not on file  Food Insecurity: Not on file  Transportation Needs: Not on file  Physical Activity: Not on file  Stress: Not on file  Social Connections: Not on file  Intimate Partner Violence: Not on file    Review of Systems: See HPI, otherwise negative ROS  Physical Exam: BP 130/78 (BP Location: Right Arm, Patient Position: Sitting, Cuff Size: Normal)   Pulse 75   Temp (!) 97.5 F (36.4 C) (Temporal)   Ht '5\' 1"'$  (1.549 m)   Wt 148 lb (67.1 kg)   SpO2 96%   BMI 27.96 kg/m  General:   Alert,  Well-developed, well-nourished, pleasant and cooperative in NAD SNeck:  Supple; no masses or thyromegaly. No significant cervical adenopathy. Lungs:  Clear throughout to auscultation.   No wheezes, crackles, or rhonchi. No acute distress. Heart:  Regular rate and rhythm; no murmurs, clicks, rubs,  or gallops. Abdomen: Non-distended, normal bowel sounds.  Soft and nontender without appreciable mass or hepatosplenomegaly.  Pulses:  Normal pulses noted. Extremities:  Without clubbing or edema.  Impression/Plan: 81 year old lady with a recent self-limiting illnesses.  Findings on CT in March of this year suggested descending colon diverticulitis got better with antibiotics.  Acute illness 1 month ago suggested a proctocolitis-resolved with antibiotics.  Clinically, back to baseline.  Nonspecific right renal lesion.   History of colonic adenoma  Recommendations:  Continue taking Protonix 40 mg every day 30 minutes before breakfast  Continue taking Florajen for another 3 months or so then can taper off as desired  Begin Benefiber 1 tablespoon daily  As discussed, we will schedule a diagnostic colonoscopy (abnormal colon on CT).  ASA 3.  The risks, benefits, limitations, alternatives and imponderables have been reviewed with the patient. Questions have been answered. All parties are agreeable.    See Dr. Nevada Crane about getting a kidney MRI in about 6 months as recommended by the  radiologist to follow-up on the abnormal spot in your kidney  Further recommendations to follow.       Notice: This dictation was prepared with Dragon dictation along with smaller phrase technology. Any transcriptional errors that result from this process are unintentional and may not be corrected upon review.

## 2021-07-02 NOTE — Progress Notes (Unsigned)
Primary Care Physician:  Celene Squibb, MD Primary Gastroenterologist:  Dr. Gala Romney  Pre-Procedure History & Physical: HPI:  Diane Torres is a 81 y.o. female here for follow-up of recurrent diverticulitis/colitis.  Last seen here about 3 years ago.  Prior colonoscopy 2017 demonstrated a rectal adenoma and pancolonic diverticulosis.  No further endoscopic evaluation warranted due to age.  Since this time she has had 3 bouts of diverticulitis.  Infection was admitted to the hospital with sepsis and March of this year.  Had a left-sided diverticulitis without abscess.  She recovered with antibiotics.  Recurrent abdominal pain a month ago for when she underwent a repeat CT which revealed inflammatory changes in the left and right colon more consistent with colitis rather than diverticulitis.  Responded to antibiotics.  She saw Dr. Hassell Done in [place] previously who did not recommend surgery in part due to increased age.  Patient has not had any fever or chills for at least a month.  Has alternating constipation and diarrhea may go a day without a bowel movement.  Has at most 3 bowel movements daily may start formed and have 2 loose bowels.  He has not seen any blood.  She has been able to eat she has no dysphagia.  Her GERD is well controlled on Protonix 40 mg daily  Family history is significant in a brother with Crohn's colitis status post resection previously.  Small renal lesion seen on last CT for which MRI in 6 to 12 months recommended.  She is not taking any sort of fiber supplement  Past Medical History:  Diagnosis Date   CAP (community acquired pneumonia) 05/2018   Diverticulitis    CT verified in 2015, 2018, 2019, 2020   GERD (gastroesophageal reflux disease)    Hypothyroidism    Impaired fasting glucose    Mixed hyperlipidemia     Past Surgical History:  Procedure Laterality Date   COLONOSCOPY     COLONOSCOPY N/A 12/12/2015   Dr. Gala Romney: one 3 mm tubular adenoma in rectum  s/p removal. Diverticulosis in entire colon. Internal hemorrhoids.    ESOPHAGOGASTRODUODENOSCOPY N/A 03/19/2016   Dr. Gala Romney: LA Grade A esophagitis, empiric dilation, normal stomach, normal duodenum   Left lobe thyroidectomy     MALONEY DILATION N/A 03/19/2016   Procedure: Venia Minks DILATION;  Surgeon: Daneil Dolin, MD;  [facility];  Service: Endoscopy;  Laterality: N/A;   POLYPECTOMY  12/12/2015   Procedure: POLYPECTOMY;  Surgeon: Daneil Dolin, MD;  [facility];  Service: Endoscopy;;  colon   RECTOCELE REPAIR     TOTAL ABDOMINAL HYSTERECTOMY W/ BILATERAL SALPINGOOPHORECTOMY      Prior to Admission medications   Medication Sig Start Date End Date Taking? Authorizing Provider  acetaminophen (TYLENOL) 500 MG tablet Take 1 tablet (500 mg total) by mouth every 6 (six) hours as needed. 10/29/20  Yes Sheikh, Omair Latif, DO  cetirizine (ZYRTEC) 10 MG tablet Take 10 mg by mouth daily. 08/04/19  Yes [provider]  losartan (COZAAR) 100 MG tablet Take 1 tablet (100 mg total) by mouth daily. 04/24/21  Yes Gerlene Fee, NP  pantoprazole (PROTONIX) 40 MG tablet Take 40 mg by mouth daily.   Yes [provider]  simvastatin (ZOCOR) 40 MG tablet Take 1 tablet (40 mg total) by mouth at bedtime. 04/24/21  Yes Gerlene Fee, NP    Allergies as of 07/02/2021 - Review Complete 07/02/2021  Allergen Reaction Noted   Bee venom  Anaphylaxis 03/18/2016    Family History  Problem Relation Age of Onset   CAD Brother        2 brothers with CAD in their 28s   Crohn's disease Brother        had colon resection   Pancreatic cancer Brother    CAD Father    Heart failure Mother    Diabetes Mellitus II Mother    Colon cancer Neg Hx     Social History   Socioeconomic History   Marital status: Married    Spouse name: Not on file   Number of children: Not on file   Years of education: Not on file   Highest education level: Not on file  Occupational History    Not on file  Tobacco Use   Smoking status: Never   Smokeless tobacco: Never  Vaping Use   Vaping Use: Never used  Substance and Sexual Activity   Alcohol use: No   Drug use: No   Sexual activity: Yes  Other Topics Concern   Not on file  Social History Narrative   Not on file   Social Determinants of Health   Financial Resource Strain: Not on file  Food Insecurity: Not on file  Transportation Needs: Not on file  Physical Activity: Not on file  Stress: Not on file  Social Connections: Not on file  Intimate Partner Violence: Not on file    Review of Systems: See HPI, otherwise negative ROS  Physical Exam: BP 130/78 (BP Location: Right Arm, Patient Position: Sitting, Cuff Size: Normal)   Pulse 75   Temp (!) 97.5 F (36.4 C) (Temporal)   Ht '5\' 1"'$  (1.549 m)   Wt 148 lb (67.1 kg)   SpO2 96%   BMI 27.96 kg/m  General:   Alert,   pleasant and cooperative in NAD; accompanied by her husband Neck:  Supple; no masses or thyromegaly. No significant cervical adenopathy. Lungs:  Clear throughout to auscultation.   No wheezes, crackles, or rhonchi. No acute distress. Heart:  Regular rate and rhythm; no murmurs, clicks, rubs,  or gallops. Abdomen: Non-distended, normal bowel sounds.  Soft and nontender without appreciable mass or hepatosplenomegaly.  Pulses:  Normal pulses noted. Extremities:  Without clubbing or edema.  Impression/Plan: 81 year old lady with extensive diverticulosis; history of recurrent diverticulitis and colitis.  Abnormal CTs earlier this year consistent with both entities.  Clinically over her acute illness.  Alternating constipation and diarrhea.  May go a day without a bowel movement.  May have postinfectious irritable bowel syndrome.  Due to abnormal imaging and ongoing symptoms, positive family history of Crohn's colitis, we ought to go ahead and directly image her colon 1 more time.  GERD well-controlled on Protonix 40 mg daily without any alarm  features  Renal lesion-nonspecific on CT-MRI recommended  Recommendations:  Continue taking Protonix 40 mg every day 30 minutes before breakfast  Continue taking Florajen for another 3 months or so then can taper off as desired  Begin Benefiber 1 tablespoon daily  As discussed, we will schedule a diagnostic colonoscopy (abnormal colon on CT).  ASA 3.  The risks, benefits, limitations, alternatives and imponderables have been reviewed with the patient. Questions have been answered. All parties are agreeable.    See Dr. Nevada Crane about getting a kidney MRI in about 6 months as recommended by the radiologist to follow-up on the abnormal spot in your kidney  Further recommendations to follow.        Notice: This dictation  was prepared with Dragon dictation along with smaller phrase technology. Any transcriptional errors that result from this process are unintentional and may not be corrected upon review.

## 2021-07-04 ENCOUNTER — Ambulatory Visit: Payer: Medicare Other | Admitting: Gastroenterology

## 2021-08-14 NOTE — Patient Instructions (Signed)
Diane Torres  08/14/2021     '@PREFPERIOPPHARMACY'$ @   Your procedure is scheduled on  08/21/2021.   Report to [name] at  [place]  A.M.   Call this number if you have problems the morning of surgery:  [phone]   Remember:  Follow the diet and prep instructions given to you by the office.    Take these medicines the morning of surgery with A SIP OF WATER                          Claritin, protonix.       Do not wear jewelry, make-up or nail polish.  Do not wear lotions, powders, or perfumes, or deodorant.  Do not shave 48 hours prior to surgery.  Men may shave face and neck.  Do not bring valuables to the hospital.  [facility] is not responsible for any belongings or valuables.  Contacts, dentures or bridgework may not be worn into surgery.  Leave your suitcase in the car.  After surgery it may be brought to your room.  For patients admitted to the hospital, discharge time will be determined by your treatment team.  Patients discharged the day of surgery will not be allowed to drive home and must have someone with them for 24 hours.    Special instructions:   DO NOT smoke tobacco or vape for 24 hours before your procedure.  Please read over the following fact sheets that you were given. Anesthesia Post-op Instructions and Care and Recovery After Surgery      Colonoscopy, Adult, Care After The following information offers guidance on how to care for yourself after your procedure. Your health care provider may also give you more specific instructions. If you have problems or questions, contact your health care provider. What can I expect after the procedure? After the procedure, it is common to have: A small amount of blood in your stool for 24 hours after the procedure. Some gas. Mild cramping or bloating of your abdomen. Follow these instructions at home: Eating and drinking  Drink enough fluid to keep your urine pale yellow. Follow instructions from  your health care provider about eating or drinking restrictions. Resume your normal diet as told by your health care provider. Avoid heavy or fried foods that are hard to digest. Activity Rest as told by your health care provider. Avoid sitting for a long time without moving. Get up to take short walks every 1-2 hours. This is important to improve blood flow and breathing. Ask for help if you feel weak or unsteady. Return to your normal activities as told by your health care provider. Ask your health care provider what activities are safe for you. Managing cramping and bloating  Try walking around when you have cramps or feel bloated. If directed, apply heat to your abdomen as told by your health care provider. Use the heat source that your health care provider recommends, such as a moist heat pack or a heating pad. Place a towel between your skin and the heat source. Leave the heat on for 20-30 minutes. Remove the heat if your skin turns bright red. This is especially important if you are unable to feel pain, heat, or cold. You have a greater risk of getting burned. General instructions If you were given a sedative during the procedure, it can affect you for several hours. Do not drive or operate machinery until your  health care provider says that it is safe. For the first 24 hours after the procedure: Do not sign important documents. Do not drink alcohol. Do your regular daily activities at a slower pace than normal. Eat soft foods that are easy to digest. Take over-the-counter and prescription medicines only as told by your health care provider. Keep all follow-up visits. This is important. Contact a health care provider if: You have blood in your stool 2-3 days after the procedure. Get help right away if: You have more than a small spotting of blood in your stool. You have large blood clots in your stool. You have swelling of your abdomen. You have nausea or vomiting. You have a  fever. You have increasing pain in your abdomen that is not relieved with medicine. These symptoms may be an emergency. Get help right away. Call 911. Do not wait to see if the symptoms will go away. Do not drive yourself to the hospital. Summary After the procedure, it is common to have a small amount of blood in your stool. You may also have mild cramping and bloating of your abdomen. If you were given a sedative during the procedure, it can affect you for several hours. Do not drive or operate machinery until your health care provider says that it is safe. Get help right away if you have a lot of blood in your stool, nausea or vomiting, a fever, or increased pain in your abdomen. This information is not intended to replace advice given to you by your health care provider. Make sure you discuss any questions you have with your health care provider. Document Revised: 09/12/2020 Document Reviewed: 09/12/2020 Elsevier Patient Education  [place] After This sheet gives you information about how to care for yourself after your procedure. Your health care provider may also give you more specific instructions. If you have problems or questions, contact your health care provider. What can I expect after the procedure? After the procedure, it is common to have: Tiredness. Forgetfulness about what happened after the procedure. Impaired judgment for important decisions. Nausea or vomiting. Some difficulty with balance. Follow these instructions at home: For the time period you were told by your health care provider:     Rest as needed. Do not participate in activities where you could fall or become injured. Do not drive or use machinery. Do not drink alcohol. Do not take sleeping pills or medicines that cause drowsiness. Do not make important decisions or sign legal documents. Do not take care of children on your own. Eating and drinking Follow the  diet that is recommended by your health care provider. Drink enough fluid to keep your urine pale yellow. If you vomit: Drink water, juice, or soup when you can drink without vomiting. Make sure you have little or no nausea before eating solid foods. General instructions Have a responsible adult stay with you for the time you are told. It is important to have someone help care for you until you are awake and alert. Take over-the-counter and prescription medicines only as told by your health care provider. If you have sleep apnea, surgery and certain medicines can increase your risk for breathing problems. Follow instructions from your health care provider about wearing your sleep device: Anytime you are sleeping, including during daytime naps. While taking prescription pain medicines, sleeping medicines, or medicines that make you drowsy. Avoid smoking. Keep all follow-up visits as told by your health care provider. This is  important. Contact a health care provider if: You keep feeling nauseous or you keep vomiting. You feel light-headed. You are still sleepy or having trouble with balance after 24 hours. You develop a rash. You have a fever. You have redness or swelling around the IV site. Get help right away if: You have trouble breathing. You have new-onset confusion at home. Summary For several hours after your procedure, you may feel tired. You may also be forgetful and have poor judgment. Have a responsible adult stay with you for the time you are told. It is important to have someone help care for you until you are awake and alert. Rest as told. Do not drive or operate machinery. Do not drink alcohol or take sleeping pills. Get help right away if you have trouble breathing, or if you suddenly become confused. This information is not intended to replace advice given to you by your health care provider. Make sure you discuss any questions you have with your health care  provider. Document Revised: 12/25/2020 Document Reviewed: 12/23/2018 Elsevier Patient Education  [place].

## 2021-08-16 ENCOUNTER — Encounter (HOSPITAL_COMMUNITY)
Admission: RE | Admit: 2021-08-16 | Discharge: 2021-08-16 | Disposition: A | Discharge status: Home / Self Care | Payer: Medicare Other | Source: Ambulatory Visit | Attending: Internal Medicine | Admitting: Internal Medicine

## 2021-08-21 ENCOUNTER — Encounter (HOSPITAL_COMMUNITY)
Admission: RE | Disposition: A | Discharge status: Home / Self Care | Payer: Self-pay | Source: Ambulatory Visit | Attending: Internal Medicine

## 2021-08-21 ENCOUNTER — Ambulatory Visit (HOSPITAL_BASED_OUTPATIENT_CLINIC_OR_DEPARTMENT_OTHER): Payer: Medicare Other | Admitting: Anesthesiology

## 2021-08-21 ENCOUNTER — Encounter (HOSPITAL_COMMUNITY): Payer: Self-pay | Admitting: Internal Medicine

## 2021-08-21 ENCOUNTER — Ambulatory Visit (HOSPITAL_COMMUNITY): Payer: Medicare Other | Admitting: Anesthesiology

## 2021-08-21 ENCOUNTER — Ambulatory Visit (HOSPITAL_COMMUNITY)
Admission: RE | Admit: 2021-08-21 | Discharge: 2021-08-21 | Disposition: A | Discharge status: Home / Self Care | Payer: Medicare Other | Source: Ambulatory Visit | Attending: Internal Medicine | Admitting: Internal Medicine

## 2021-08-21 ENCOUNTER — Other Ambulatory Visit: Payer: Self-pay

## 2021-08-21 DIAGNOSIS — K5140 Inflammatory polyps of colon without complications: Secondary | ICD-10-CM | POA: Insufficient documentation

## 2021-08-21 DIAGNOSIS — I10 Essential (primary) hypertension: Secondary | ICD-10-CM | POA: Insufficient documentation

## 2021-08-21 DIAGNOSIS — K5730 Diverticulosis of large intestine without perforation or abscess without bleeding: Secondary | ICD-10-CM

## 2021-08-21 DIAGNOSIS — R933 Abnormal findings on diagnostic imaging of other parts of digestive tract: Secondary | ICD-10-CM | POA: Insufficient documentation

## 2021-08-21 DIAGNOSIS — K635 Polyp of colon: Secondary | ICD-10-CM

## 2021-08-21 DIAGNOSIS — K219 Gastro-esophageal reflux disease without esophagitis: Secondary | ICD-10-CM | POA: Insufficient documentation

## 2021-08-21 DIAGNOSIS — R011 Cardiac murmur, unspecified: Secondary | ICD-10-CM | POA: Diagnosis not present

## 2021-08-21 DIAGNOSIS — D649 Anemia, unspecified: Secondary | ICD-10-CM | POA: Insufficient documentation

## 2021-08-21 DIAGNOSIS — D759 Disease of blood and blood-forming organs, unspecified: Secondary | ICD-10-CM | POA: Diagnosis not present

## 2021-08-21 HISTORY — PX: COLONOSCOPY WITH PROPOFOL: SHX5780

## 2021-08-21 HISTORY — PX: POLYPECTOMY: SHX5525

## 2021-08-21 SURGERY — COLONOSCOPY WITH PROPOFOL
Anesthesia: General

## 2021-08-21 MED ORDER — PROPOFOL 500 MG/50ML IV EMUL
INTRAVENOUS | Status: DC | PRN
  Administered 2021-08-21: 150 ug/kg/min via INTRAVENOUS

## 2021-08-21 MED ORDER — LACTATED RINGERS IV SOLN: INTRAVENOUS | Status: DC

## 2021-08-21 MED ORDER — PROPOFOL 10 MG/ML IV BOLUS
INTRAVENOUS | Status: DC | PRN
  Administered 2021-08-21: 60 mg via INTRAVENOUS

## 2021-08-21 MED ORDER — PROPOFOL 500 MG/50ML IV EMUL
INTRAVENOUS | Status: AC
  Filled 2021-08-21: qty 50

## 2021-08-21 NOTE — Discharge Instructions (Addendum)
  Colonoscopy Discharge Instructions  Read the instructions outlined below and refer to this sheet in the next few weeks. These discharge instructions provide you with general information on caring for yourself after you leave the hospital. Your doctor may also give you specific instructions. While your treatment has been planned according to the most current medical practices available, unavoidable complications occasionally occur. If you have any problems or questions after discharge, call Dr. Gala Romney at [phone]. ACTIVITY You may resume your regular activity, but move at a slower pace for the next 24 hours.  Take frequent rest periods for the next 24 hours.  Walking will help get rid of the air and reduce the bloated feeling in your belly (abdomen).  No driving for 24 hours (because of the medicine (anesthesia) used during the test).   Do not sign any important legal documents or operate any machinery for 24 hours (because of the anesthesia used during the test).  NUTRITION Drink plenty of fluids.  You may resume your normal diet as instructed by your doctor.  Begin with a light meal and progress to your normal diet. Heavy or fried foods are harder to digest and may make you feel sick to your stomach (nauseated).  Avoid alcoholic beverages for 24 hours or as instructed.  MEDICATIONS You may resume your normal medications unless your doctor tells you otherwise.  WHAT YOU CAN EXPECT TODAY Some feelings of bloating in the abdomen.  Passage of more gas than usual.  Spotting of blood in your stool or on the toilet paper.  IF YOU HAD POLYPS REMOVED DURING THE COLONOSCOPY: No aspirin products for 7 days or as instructed.  No alcohol for 7 days or as instructed.  Eat a soft diet for the next 24 hours.  FINDING OUT THE RESULTS OF YOUR TEST Not all test results are available during your visit. If your test results are not back during the visit, make an appointment with your caregiver to find out the  results. Do not assume everything is normal if you have not heard from your caregiver or the medical facility. It is important for you to follow up on all of your test results.  SEEK IMMEDIATE MEDICAL ATTENTION IF: You have more than a spotting of blood in your stool.  Your belly is swollen (abdominal distention).  You are nauseated or vomiting.  You have a temperature over 101.  You have abdominal pain or discomfort that is severe or gets worse throughout the day.       You have diverticulosis throughout your colon.  No sign of diverticulitis   no sign of tumor.  1 polyp removed   further recommendations to follow pending review of pathology report   increase Benefiber 2 tablespoons daily   office visit with [place] in 6 months   at patient request, I called Charlie at [phone] -  "voicemail box not set up"

## 2021-08-21 NOTE — H&P (Signed)
@LOGO @   Primary Care Physician:  Celene Squibb, MD Primary Gastroenterologist:  Dr. Gala Romney  Pre-Procedure History & Physical: HPI:  Diane Torres is a 81 y.o. female here for for diagnostic colonoscopy.  History of abnormal colon in setting of acute GI illness.   currently, asymptomatic from a GI standpoint.   History of colonic adenoma removed previously.  Past Medical History:  Diagnosis Date   CAP (community acquired pneumonia) 05/2018   Diverticulitis    CT verified in 2015, 2018, 2019, 2020   GERD (gastroesophageal reflux disease)    Hypothyroidism    Impaired fasting glucose    Mixed hyperlipidemia     Past Surgical History:  Procedure Laterality Date   COLONOSCOPY     COLONOSCOPY N/A 12/12/2015   Dr. Gala Romney: one 3 mm tubular adenoma in rectum s/p removal. Diverticulosis in entire colon. Internal hemorrhoids.    ESOPHAGOGASTRODUODENOSCOPY N/A 03/19/2016   Dr. Gala Romney: LA Grade A esophagitis, empiric dilation, normal stomach, normal duodenum   Left lobe thyroidectomy     MALONEY DILATION N/A 03/19/2016   Procedure: Venia Minks DILATION;  Surgeon: Daneil Dolin, MD;  Location: [facility];  Service: Endoscopy;  Laterality: N/A;   POLYPECTOMY  12/12/2015   Procedure: POLYPECTOMY;  Surgeon: Daneil Dolin, MD;  Location: [facility];  Service: Endoscopy;;  colon   RECTOCELE REPAIR     TOTAL ABDOMINAL HYSTERECTOMY W/ BILATERAL SALPINGOOPHORECTOMY      Prior to Admission medications   Medication Sig Start Date End Date Taking? Authorizing Provider  acetaminophen (TYLENOL) 500 MG tablet Take 1 tablet (500 mg total) by mouth every 6 (six) hours as needed. Patient taking differently: Take 1,000 mg by mouth every 6 (six) hours as needed for moderate pain. 10/29/20  Yes Sheikh, Omair Latif, DO  ketotifen (ALAWAY) 0.025 % ophthalmic solution Place 1 drop into both eyes daily as needed (allergies).   Yes [provider]  loratadine (CLARITIN) 10 MG tablet Take 10 mg by mouth  daily.   Yes [provider]  losartan (COZAAR) 100 MG tablet Take 1 tablet (100 mg total) by mouth daily. 04/24/21  Yes Gerlene Fee, NP  Menthol-Methyl Salicylate (SALONPAS PAIN RELIEF PATCH EX) Apply 1 patch topically daily as needed (pain).   Yes [provider]  pantoprazole (PROTONIX) 40 MG tablet Take 40 mg by mouth daily.   Yes [provider]  simvastatin (ZOCOR) 40 MG tablet Take 1 tablet (40 mg total) by mouth at bedtime. 04/24/21  Yes Gerlene Fee, NP  Sod Picosulfate-Mag Ox-Cit Acd (CLENPIQ) 10-3.5-12 MG-GM -GM/175ML SOLN Take 1 kit by mouth as directed. 07/02/21  Yes Saanvi Hakala, Cristopher Estimable, MD  Wheat Dextrin (BENEFIBER PO) Take 1 Dose by mouth daily.   Yes [provider]    Allergies as of 07/02/2021 - Review Complete 07/02/2021  Allergen Reaction Noted   Bee venom Anaphylaxis 03/18/2016    Family History  Problem Relation Age of Onset   CAD Brother        2 brothers with CAD in their 34s   Crohn's disease Brother        had colon resection   Pancreatic cancer Brother    CAD Father    Heart failure Mother    Diabetes Mellitus II Mother    Colon cancer Neg Hx     Social History   Socioeconomic History   Marital status: Married    Spouse name: Not on file   Number of children:  Not on file   Years of education: Not on file   Highest education level: Not on file  Occupational History   Not on file  Tobacco Use   Smoking status: Never   Smokeless tobacco: Never  Vaping Use   Vaping Use: Never used  Substance and Sexual Activity   Alcohol use: No   Drug use: No   Sexual activity: Yes  Other Topics Concern   Not on file  Social History Narrative   Not on file   Social Determinants of Health   Financial Resource Strain: Not on file  Food Insecurity: Not on file  Transportation Needs: Not on file  Physical Activity: Not on file  Stress: Not on file  Social Connections: Not on file  Intimate Partner Violence: Not on  file    Review of Systems: See HPI, otherwise negative ROS  Physical Exam: BP (!) 167/73   Pulse 86   Temp 98 F (36.7 C) (Oral)   Resp 16   Ht 5' 1"  (1.549 m)   Wt 67.1 kg   SpO2 98%   BMI 27.96 kg/m  General:   Alert,  Well-developed, well-nourished, pleasant and cooperative in NAD SNeck:  Supple; no masses or thyromegaly. No significant cervical adenopathy. Lungs:  Clear throughout to auscultation.   No wheezes, crackles, or rhonchi. No acute distress. Heart:  Regular rate and rhythm; no murmurs, clicks, rubs,  or gallops. Abdomen: Non-distended, normal bowel sounds.  Soft and nontender without appreciable mass or hepatosplenomegaly.  Pulses:  Normal pulses noted. Extremities:  Without clubbing or edema.  Impression/Plan:    81 year old lady with recent GI illness.  Abnormal colon on CT suggestive of colitis/diverticulitis.  Clinically, improved.  History of colonic adenoma.  The risks, benefits, limitations, alternatives and imponderables have been reviewed with the patient. Questions have been answered. All parties are agreeable.       Notice: This dictation was prepared with Dragon dictation along with smaller phrase technology. Any transcriptional errors that result from this process are unintentional and may not be corrected upon review.

## 2021-08-21 NOTE — Anesthesia Preprocedure Evaluation (Addendum)
Anesthesia Evaluation  Patient identified by MRN, date of birth, ID band Patient awake    Reviewed: Allergy & Precautions, NPO status , Patient's Chart, lab work & pertinent test results  Airway Mallampati: II  TM Distance: >3 FB Neck ROM: Full    Dental  (+) Dental Advisory Given No notable dental injury :   Pulmonary pneumonia, resolved,    Pulmonary exam normal breath sounds clear to auscultation       Cardiovascular Exercise Tolerance: Good hypertension, Pt. on medications  Rhythm:Regular Rate:Normal + Systolic murmurs 1. Left ventricular ejection fraction, by estimation, is 60 to 65%. The left ventricle has normal function. The left ventricle has no regional wall motion abnormalities. Left ventricular diastolic parameters were normal.  2. Right ventricular systolic function is normal. The right ventricular size is normal. There is normal pulmonary artery systolic pressure.  3. The pericardial effusion is posterior to the left ventricle.  4. The mitral valve is degenerative. Trivial mitral valve regurgitation.  No evidence of mitral stenosis. Moderate mitral annular calcification.  5. The aortic valve is tricuspid. Aortic valve regurgitation is mild.  Mild to moderate aortic valve sclerosis/calcification is present, without any evidence of aortic stenosis.  6. The inferior vena cava is normal in size with greater than 50% respiratory variability, suggesting right atrial pressure of 3 mmHg.    Neuro/Psych Intracranial mass negative psych ROS   GI/Hepatic Neg liver ROS, GERD  Medicated and Controlled,  Endo/Other  Hypothyroidism   Renal/GU Renal disease  negative genitourinary   Musculoskeletal negative musculoskeletal ROS (+)   Abdominal   Peds negative pediatric ROS (+)  Hematology  (+) Blood dyscrasia, anemia ,   Anesthesia Other Findings   Reproductive/Obstetrics negative OB ROS                             Anesthesia Physical Anesthesia Plan  ASA: 2  Anesthesia Plan: General   Post-op Pain Management: Minimal or no pain anticipated   Induction: Intravenous  PONV Risk Score and Plan: Propofol infusion  Airway Management Planned: Nasal Cannula and Natural Airway  Additional Equipment:   Intra-op Plan:   Post-operative Plan:   Informed Consent: I have reviewed the patients History and Physical, chart, labs and discussed the procedure including the risks, benefits and alternatives for the proposed anesthesia with the patient or authorized representative who has indicated his/her understanding and acceptance.     Dental advisory given  Plan Discussed with: CRNA and Surgeon  Anesthesia Plan Comments:         Anesthesia Quick Evaluation

## 2021-08-21 NOTE — Op Note (Signed)
[facility] Patient Name: Diane Torres Procedure Date: 08/21/2021 9:59 AM MRN: [id number] Date of Birth: Nov 08, 1940 Attending MD: Gennette Pac , MD CSN: [id number] Age: 81 Admit Type: Outpatient Procedure:                Colonoscopy Indications:              Abnormal CT of the GI tract Providers:                Gennette Pac, MD, Nena Polio, RN, Hinton Rao, Dyann Ruddle Referring MD:              Medicines:                Propofol per Anesthesia Complications:            No immediate complications. Estimated Blood Loss:     Estimated blood loss was minimal. Procedure:                Pre-Anesthesia Assessment:                           - Prior to the procedure, a History and Physical                            was performed, and patient medications and                            allergies were reviewed. The patient's tolerance of                            previous anesthesia was also reviewed. The risks                            and benefits of the procedure and the sedation                            options and risks were discussed with the patient.                            All questions were answered, and informed consent                            was obtained. Prior Anticoagulants: The patient has                            taken no previous anticoagulant or antiplatelet                            agents. ASA Grade Assessment: III - A patient with                            severe systemic disease. After reviewing the risks  and benefits, the patient was deemed in                            satisfactory condition to undergo the procedure.                           After obtaining informed consent, the colonoscope                            was passed under direct vision. Throughout the                            procedure, the patient's blood pressure, pulse, and                            oxygen  saturations were monitored continuously. The                            [id number]) scope was introduced through the                            anus and advanced to the the cecum, identified by                            appendiceal orifice and ileocecal valve. The                            colonoscopy was performed without difficulty. The                            patient tolerated the procedure well. The quality                            of the bowel preparation was adequate. Scope In: 10:17:55 AM Scope Out: 10:28:36 AM Scope Withdrawal Time: 0 hours 6 minutes 13 seconds  Total Procedure Duration: 0 hours 10 minutes 41 seconds  Findings:      The perianal and digital rectal examinations were normal.      Scattered small and large-mouthed diverticula were found in the entire       colon.      A 6 mm polyp was found in the splenic flexure. The polyp was       semi-pedunculated. The polyp was removed with a cold snare. Resection       and retrieval were complete. Estimated blood loss was minimal.      The exam was otherwise without abnormality on direct and retroflexion       views. Impression:               - Diverticulosis in the entire examined colon.                           - One 6 mm polyp at the splenic flexure, removed                            with a cold snare.  Resected and retrieved.                           - The examination was otherwise normal on direct                            and retroflexion views. Moderate Sedation:      Moderate (conscious) sedation was personally administered by an       anesthesia professional. The following parameters were monitored: oxygen       saturation, heart rate, blood pressure, respiratory rate, EKG, adequacy       of pulmonary ventilation, and response to care. Recommendation:           - Patient has a contact number available for                            emergencies. The signs and symptoms of potential                             delayed complications were discussed with the                            patient. Return to normal activities tomorrow.                            Written discharge instructions were provided to the                            patient.                           - Resume previous diet.                           - Continue present medications.                           - Repeat colonoscopy in 5 years for surveillance                            based on pathology results.                           - Return to GI office in 6 months. Benefiber 2                            tablespoons daily. Procedure Code(s):        --- Professional ---                           [phone], Colonoscopy, flexible; with removal of                            tumor(s), polyp(s), or other lesion(s) by snare  technique Diagnosis Code(s):        --- Professional ---                           K63.5, Polyp of colon                           K57.30, Diverticulosis of large intestine without                            perforation or abscess without bleeding                           R93.3, Abnormal findings on diagnostic imaging of                            other parts of digestive tract CPT copyright 2019 American [employer]. All rights reserved. The codes documented in this report are preliminary and upon coder review may  be revised to meet current compliance requirements. Gerrit Friends. Shreyansh Tiffany, MD Gennette Pac, MD 08/21/2021 10:36:05 AM This report has been signed electronically. Number of Addenda: 0

## 2021-08-21 NOTE — Transfer of Care (Signed)
Immediate Anesthesia Transfer of Care Note  Patient: Diane Torres  Procedure(s) Performed: COLONOSCOPY WITH PROPOFOL POLYPECTOMY  Patient Location: PACU  Anesthesia Type:General  Level of Consciousness: awake, alert  and oriented  Airway & Oxygen Therapy: Patient Spontanous Breathing  Post-op Assessment: Report given to RN, Post -op Vital signs reviewed and stable, Patient moving all extremities X 4 and Patient able to stick tongue midline  Post vital signs: Reviewed  Last Vitals:  Vitals Value Taken Time  BP 115/43 08/21/21 1035  Temp 36.5 C 08/21/21 1035  Pulse 74 08/21/21 1035  Resp 19 08/21/21 1035  SpO2 96 % 08/21/21 1035    Last Pain:  Vitals:   08/21/21 1035  TempSrc: Oral  PainSc: 0-No pain         Complications: No notable events documented.

## 2021-08-21 NOTE — Anesthesia Postprocedure Evaluation (Signed)
Anesthesia Post Note  Patient: Diane Torres  Procedure(s) Performed: COLONOSCOPY WITH PROPOFOL POLYPECTOMY  Patient location during evaluation: Phase II Anesthesia Type: General Level of consciousness: awake and alert and oriented Pain management: pain level controlled Vital Signs Assessment: post-procedure vital signs reviewed and stable Respiratory status: spontaneous breathing, nonlabored ventilation and respiratory function stable Cardiovascular status: blood pressure returned to baseline and stable Postop Assessment: no apparent nausea or vomiting Anesthetic complications: no   No notable events documented.   Last Vitals:  Vitals:   08/21/21 0849 08/21/21 1035  BP: (!) 167/73 (!) 115/43  Pulse: 86 74  Resp: 16 19  Temp: 36.7 C 36.5 C  SpO2: 98% 96%    Last Pain:  Vitals:   08/21/21 1035  TempSrc: Oral  PainSc: 0-No pain                 Mallery Harshman C Myrakle Wingler

## 2021-08-22 ENCOUNTER — Encounter: Payer: Self-pay | Admitting: Internal Medicine

## 2021-08-22 LAB — SURGICAL PATHOLOGY

## 2021-08-27 ENCOUNTER — Encounter (HOSPITAL_COMMUNITY): Payer: Self-pay | Admitting: Internal Medicine

## 2021-09-18 ENCOUNTER — Other Ambulatory Visit (HOSPITAL_COMMUNITY): Payer: Self-pay | Admitting: Internal Medicine

## 2021-09-18 ENCOUNTER — Other Ambulatory Visit: Payer: Self-pay | Admitting: Internal Medicine

## 2021-09-18 DIAGNOSIS — N281 Cyst of kidney, acquired: Secondary | ICD-10-CM

## 2021-11-13 ENCOUNTER — Ambulatory Visit (HOSPITAL_COMMUNITY): Admission: RE | Admit: 2021-11-13 | Payer: Medicare Other | Source: Ambulatory Visit

## 2021-11-18 ENCOUNTER — Ambulatory Visit (HOSPITAL_COMMUNITY)
Admission: RE | Admit: 2021-11-18 | Discharge: 2021-11-18 | Disposition: A | Discharge status: Home / Self Care | Payer: Medicare Other | Source: Ambulatory Visit | Attending: Internal Medicine | Admitting: Internal Medicine

## 2021-11-18 ENCOUNTER — Encounter (HOSPITAL_COMMUNITY): Payer: Self-pay | Admitting: Radiology

## 2021-11-18 DIAGNOSIS — N281 Cyst of kidney, acquired: Secondary | ICD-10-CM | POA: Insufficient documentation

## 2021-11-18 MED ORDER — IOHEXOL 300 MG/ML  SOLN
100.0000 mL | Freq: Once | INTRAMUSCULAR | Status: AC | PRN
  Administered 2021-11-18: 80 mL via INTRAVENOUS

## 2021-11-20 LAB — POCT I-STAT CREATININE: Creatinine, Ser: 1.2 mg/dL — ABNORMAL HIGH (ref 0.44–1.00)

## 2021-12-23 ENCOUNTER — Ambulatory Visit (INDEPENDENT_AMBULATORY_CARE_PROVIDER_SITE_OTHER): Payer: Medicare Other | Admitting: Urology

## 2021-12-23 ENCOUNTER — Encounter: Payer: Self-pay | Admitting: Urology

## 2021-12-23 VITALS — BP 141/72 | HR 81

## 2021-12-23 DIAGNOSIS — N281 Cyst of kidney, acquired: Secondary | ICD-10-CM

## 2021-12-23 DIAGNOSIS — R3129 Other microscopic hematuria: Secondary | ICD-10-CM

## 2021-12-23 LAB — URINALYSIS, ROUTINE W REFLEX MICROSCOPIC
Bilirubin, UA: NEGATIVE
Glucose, UA: NEGATIVE
Ketones, UA: NEGATIVE
Nitrite, UA: NEGATIVE
Protein,UA: NEGATIVE
RBC, UA: NEGATIVE
Specific Gravity, UA: 1.01 (ref 1.005–1.030)
Urobilinogen, Ur: 0.2 mg/dL (ref 0.2–1.0)
pH, UA: 6 (ref 5.0–7.5)

## 2021-12-23 LAB — MICROSCOPIC EXAMINATION: RBC, Urine: NONE SEEN /hpf (ref 0–2)

## 2021-12-23 NOTE — Progress Notes (Unsigned)
12/23/2021 1:03 PM   Diane Torres May 15, 1940 [id number]  Referring provider: Celene Squibb, MD [address] [place],   [id number]  No chief complaint on file.   HPI:  F/u -    1) microscopic hematuria-in 2019 she had a UA with 21-50 red cells.  More recently it has been clear.  She has had some back pain.  A July 2022 KUB was negative. August 2022 L-spine MRI showed L1-L2 and L2-L3 disease.  There was no hydronephrosis on the visualized portion of the kidneys. She has had no gross hematuria. No h/o kidney stones. Non-smoker. She did work in [employer] and was occasionally in the dye room.   She was admitted to hospital September 2022 with neurologic issues.  CT head showed a 2.2 cm pituitary mass and 2.5 cm left frontal extra-axial mass most in keeping with a meningioma demonstrating minimal mass-effect upon the right frontal lobe.    She underwent a CT scan of your abdomen and pelvis December 17, 2020 which was benign.  She did have a cystocele. Cystoscopy was benign Nov 2022. CT B9 Mar and Nov 2023.   2) right renal cyst - noted on 2022 CT and stable 11/19/2021.   Today, seen for the above. She has no voiding complaints. She has no gross hematuria. No dysuria. Sepsis episode Mar 2023 with negative cultures. CT benign Mar 2023 and Oct  2023  -  images reviewed. UA today clear.      PMH: Past Medical History:  Diagnosis Date   CAP (community acquired pneumonia) 05/2018   Diverticulitis    CT verified in 2015, 2018, 2019, 2020   GERD (gastroesophageal reflux disease)    Hypothyroidism    Impaired fasting glucose    Mixed hyperlipidemia     Surgical History: Past Surgical History:  Procedure Laterality Date   COLONOSCOPY     COLONOSCOPY N/A 12/12/2015   Dr. Gala Romney: one 3 mm tubular adenoma in rectum s/p removal. Diverticulosis in entire colon. Internal hemorrhoids.    COLONOSCOPY WITH PROPOFOL N/A 08/21/2021   Procedure: COLONOSCOPY WITH PROPOFOL;  Surgeon: Daneil Dolin, MD;  [facility];  Service: Endoscopy;  Laterality: N/A;  10:15am   ESOPHAGOGASTRODUODENOSCOPY N/A 03/19/2016   Dr. Gala Romney: LA Grade A esophagitis, empiric dilation, normal stomach, normal duodenum   Left lobe thyroidectomy     MALONEY DILATION N/A 03/19/2016   Procedure: Venia Minks DILATION;  Surgeon: Daneil Dolin, MD;  Location: [facility];  Service: Endoscopy;  Laterality: N/A;   POLYPECTOMY  12/12/2015   Procedure: POLYPECTOMY;  Surgeon: Daneil Dolin, MD;  [facility]: [facility];  Service: Endoscopy;;  colon   POLYPECTOMY  08/21/2021   Procedure: POLYPECTOMY;  Surgeon: Daneil Dolin, MD;  [facility]: [facility];  Service: Endoscopy;;  splenic flexure;   RECTOCELE REPAIR     TOTAL ABDOMINAL HYSTERECTOMY W/ BILATERAL SALPINGOOPHORECTOMY      Home Medications:  Allergies as of 12/23/2021       Reactions   Bee Venom Anaphylaxis        Medication List        Accurate as of December 23, 2021  1:03 PM. If you have any questions, ask your nurse or doctor.          acetaminophen 500 MG tablet Commonly known as: TYLENOL Take 1 tablet (500 mg total) by mouth every 6 (six) hours as needed. What changed:  how much to take reasons to  take this   Alaway 0.025 % ophthalmic solution Generic drug: ketotifen Place 1 drop into both eyes daily as needed (allergies).   BENEFIBER PO Take 1 Dose by mouth daily.   Clenpiq 10-3.5-12 MG-GM -GM/175ML Soln Generic drug: Sod Picosulfate-Mag Ox-Cit Acd Take 1 kit by mouth as directed.   loratadine 10 MG tablet Commonly known as: CLARITIN Take 10 mg by mouth daily.   losartan 100 MG tablet Commonly known as: COZAAR Take 1 tablet (100 mg total) by mouth daily.   pantoprazole 40 MG tablet Commonly known as: PROTONIX Take 40 mg by mouth daily.   SALONPAS PAIN RELIEF PATCH EX Apply 1 patch topically daily as needed (pain).   simvastatin 40 MG tablet Commonly known as: ZOCOR Take 1 tablet (40 mg total) by  mouth at bedtime.        Allergies:  Allergies  Allergen Reactions   Bee Venom Anaphylaxis    Family History: Family History  Problem Relation Age of Onset   CAD Brother        2 brothers with CAD in their 26s   Crohn's disease Brother        had colon resection   Pancreatic cancer Brother    CAD Father    Heart failure Mother    Diabetes Mellitus II Mother    Colon cancer Neg Hx     Social History:  reports that she has never smoked. She has never used smokeless tobacco. She reports that she does not drink alcohol and does not use drugs.   Physical Exam: There were no vitals taken for this visit.  Constitutional:  Alert and oriented, No acute distress. HEENT: [place] AT, moist mucus membranes.  Trachea midline, no masses. Cardiovascular: No clubbing, cyanosis, or edema. Respiratory: Normal respiratory effort, no increased work of breathing. GI: Abdomen is soft, nontender, nondistended, no abdominal masses GU: No CVA tenderness Lymph: No cervical or inguinal lymphadenopathy. Skin: No rashes, bruises or suspicious lesions. Neurologic: Grossly intact, no focal deficits, moving all 4 extremities. Psychiatric: Normal mood and affect.  Laboratory Data: Lab Results  Component Value Date   WBC 5.6 06/05/2021   HGB 12.1 06/05/2021   HCT 40.4 06/05/2021   MCV 97.8 06/05/2021   PLT 304 06/05/2021    Lab Results  Component Value Date   CREATININE 1.20 (H) 11/18/2021    No results found for: "PSA"  No results found for: "TESTOSTERONE"  Lab Results  Component Value Date   HGBA1C 6.0 (H) 04/15/2021    Urinalysis    Component Value Date/Time   COLORURINE STRAW (A) 06/05/2021 1305   APPEARANCEUR CLEAR 06/05/2021 1305   APPEARANCEUR Clear 10/22/2020 1140   LABSPEC 1.019 06/05/2021 1305   PHURINE 6.0 06/05/2021 1305   GLUCOSEU NEGATIVE 06/05/2021 1305   HGBUR SMALL (A) 06/05/2021 1305   BILIRUBINUR NEGATIVE 06/05/2021 1305   BILIRUBINUR Negative 10/22/2020 1140    KETONESUR 20 (A) 06/05/2021 1305   PROTEINUR NEGATIVE 06/05/2021 1305   UROBILINOGEN 0.2 09/07/2006 1322   NITRITE NEGATIVE 06/05/2021 1305   LEUKOCYTESUR MODERATE (A) 06/05/2021 1305    Lab Results  Component Value Date   LABMICR Comment 10/22/2020   BACTERIA NONE SEEN 06/05/2021    Pertinent Imaging: [place] 2022, ct 2022 and 2023 x 2 -- reviewed images  Results for orders placed during the hospital encounter of 08/07/20  DG Abd 1 View  Narrative CLINICAL DATA:  Acute right flank pain.  EXAM: ABDOMEN - 1 VIEW  COMPARISON:  None.  FINDINGS: The bowel gas pattern is normal. No radio-opaque calculi or other significant radiographic abnormality are seen.  IMPRESSION: Negative.   Electronically Signed By: Marijo Conception M.D. On: 08/08/2020 08:58  No results found for this or any previous visit.  No results found for this or any previous visit.  No results found for this or any previous visit.  Results for orders placed during the hospital encounter of 04/06/21  US RENAL  Narrative CLINICAL DATA:  AK I  EXAM: RENAL / URINARY TRACT ULTRASOUND COMPLETE  COMPARISON:  April 06, 2021  FINDINGS: Right Kidney:  Renal measurements: 9.5 x 3.9 x 4.6 cm = volume: 89 mL. Echogenicity within normal limits. No mass or hydronephrosis visualized.  Left Kidney:  Renal measurements: 9.0 x 4.4 x 4.7 cm = volume: 97 mL. Echogenicity within normal limits. No mass or hydronephrosis visualized.  Bladder:  Appears normal for degree of bladder distention.  Other:  None.  IMPRESSION: 1. No hydronephrosis. 2. Mildly hyperdense focus noted on recent CT is not definitively visualized sonographically. Consider follow-up renal cyst protocol CT in 6 months.   Electronically Signed By: Valentino Saxon M.D. On: 04/07/2021 12:16  No valid procedures specified. No results found for this or any previous visit.  No results found for this or any previous  visit.   Assessment & Plan:    1. Microhematuria No worrisome symptoms. Repeat imaging benign.  - Urinalysis, Routine w reflex microscopic  2. Renal cyst - discussed benign nature.   No follow-ups on file.  Festus Aloe, MD  [facility]  [address] [place],  [address] [phone]

## 2022-01-06 ENCOUNTER — Ambulatory Visit: Payer: Medicare Other | Admitting: Gastroenterology

## 2022-01-06 ENCOUNTER — Telehealth (INDEPENDENT_AMBULATORY_CARE_PROVIDER_SITE_OTHER): Payer: Self-pay | Admitting: *Deleted

## 2022-01-06 NOTE — Telephone Encounter (Signed)
Pt had an appt today but had to cancel due to taking spouse to the ED. Please reschedule thanks!

## 2022-02-19 ENCOUNTER — Ambulatory Visit: Payer: Medicare Other | Admitting: Gastroenterology

## 2022-03-11 ENCOUNTER — Ambulatory Visit: Payer: Medicare Other | Admitting: Gastroenterology

## 2022-03-11 ENCOUNTER — Encounter: Payer: Self-pay | Admitting: Gastroenterology

## 2022-03-11 VITALS — BP 156/72 | HR 75 | Temp 97.2°F | Ht 61.0 in | Wt 151.0 lb

## 2022-03-11 DIAGNOSIS — K5792 Diverticulitis of intestine, part unspecified, without perforation or abscess without bleeding: Secondary | ICD-10-CM

## 2022-03-11 DIAGNOSIS — R197 Diarrhea, unspecified: Secondary | ICD-10-CM

## 2022-03-11 DIAGNOSIS — I10 Essential (primary) hypertension: Secondary | ICD-10-CM

## 2022-03-11 NOTE — Patient Instructions (Addendum)
Continue fiber daily. Add IBgard. Samples provided. Start by taking 2 each morning. On days you have increased stools or abdominal discomfort you can take 2 three times daily. If this helps, you can purchase over the counter.  If IBgard not helpful, please call and we will try RX dicyclomine.  Continue to keep stool diary. Write number of stools, consistency, if you took fiber or IBgard and how much.  Return office visit in 3 months or sooner if needed. Call if you have any questions or not improving and we can address by phone in the meantime.  Your repeat blood pressure remains elevated over 140/90. If you are able to recheck at home, please let PCP know if it remains elevated. Otherwise, you should call your PCP for further instructions.

## 2022-03-11 NOTE — Progress Notes (Unsigned)
GI Office Note    Referring Provider: Celene Squibb, MD Primary Care Physician:  Celene Squibb, MD  Primary Gastroenterologist: Garfield Cornea, MD   Chief Complaint   Chief Complaint  Patient presents with   Follow-up    Still having issues with bowels    History of Present Illness   Diane Torres is a 82 y.o. female presenting today for follow-up.  Last seen in May 2023.  She has a history of recurrent diverticulitis/colitis.  Previously seen by Dr. Hassell Done in [place] who did not recommend surgery in part due to increased age.  Family history of Crohn's colitis, brother.  Patient reports taking psyllium (metamucil) one tablespoon a day. Was not able to increase. Some days she skips does. She takes it at least 5 times per week. Does not take on Saturdays because of church on Sunday. She also notes that she does not eat before morning office visits or before church due to postprandial bowel movements. She has had more issues with this over the past one year. She also notes more postprandial urgency after eating chocolate ice cream, chocolate candy, peanuts, seeds, oatmeal, corn, cabbage, lettuce. Tolerates other flavors of ice cream and cheese. She has some LLQ soreness on days she has a lot of stools. Gerd well controlled. She brought in stool diary. Notable that days she has several stools, she may skip 1-2 days without a BM.   She notes her BP is up today because she had shot of cortisone yesterday.    She had follow up CT A/P with and without contrast in 11/2021 due to indeterminate right renal lesion. Study as outlined below. No further follow up of kidney lesion needed. She had similar findings of the colon with diffuse wall thickening, hyperenhancement and vascular combing similar to 06/2021 CT. Patient notes that she was not having any stomach issues at the time of this study.   Patient becomes tearful in today's visit remembering her son-in-law who committed suicide in 2022.          CT A/P with and without contrast 11/2021: IMPRESSION: 1. Unchanged, intermediate attenuation, nonenhancing lesion of the midportion of the right kidney measuring 1.2 x 1.1 cm. This is consistent with a benign hemorrhagic or proteinaceous cyst. No further routine follow-up is required for this lesion. 2. Similar appearance of diffuse colon wall thickening, hyperenhancement, and vascular combing, again suggesting nonspecific infectious or inflammatory colitis, or perhaps chronic sequelae given persistence. As previously reported, there is substantial pancolonic diverticulosis, severe in the sigmoid, however no focal findings to suggest that this processes reflects acute diverticulitis. 3. Coronary artery disease.  Colonoscopy 08/2021:  -scattered diverticulosis -78m polyp removed (inflammatory)    Medications   Current Outpatient Medications  Medication Sig Dispense Refill   acetaminophen (TYLENOL) 500 MG tablet Take 1 tablet (500 mg total) by mouth every 6 (six) hours as needed. (Patient taking differently: Take 1,000 mg by mouth every 6 (six) hours as needed for moderate pain.) 30 tablet 0   ketotifen (ALAWAY) 0.025 % ophthalmic solution Place 1 drop into both eyes daily as needed (allergies).     loratadine (CLARITIN) 10 MG tablet Take 10 mg by mouth daily.     losartan (COZAAR) 100 MG tablet Take 1 tablet (100 mg total) by mouth daily. 30 tablet 0   pantoprazole (PROTONIX) 40 MG tablet Take 40 mg by mouth daily.     simvastatin (ZOCOR) 40 MG tablet Take 1 tablet (40 mg  total) by mouth at bedtime. 30 tablet 0   traMADol (ULTRAM) 50 MG tablet Take 50 mg by mouth 2 (two) times daily as needed.     Wheat Dextrin (BENEFIBER PO) Take 1 Dose by mouth daily.     No current facility-administered medications for this visit.    Allergies   Allergies as of 03/11/2022 - Review Complete 03/11/2022  Allergen Reaction Noted   Bee venom Anaphylaxis 03/18/2016        Review of  Systems   General: Negative for anorexia, weight loss, fever, chills, fatigue, weakness. ENT: Negative for hoarseness, difficulty swallowing , nasal congestion. CV: Negative for chest pain, angina, palpitations, dyspnea on exertion, peripheral edema.  Respiratory: Negative for dyspnea at rest, dyspnea on exertion, cough, sputum, wheezing.  GI: See history of present illness. GU:  Negative for dysuria, hematuria, urinary incontinence, urinary frequency, nocturnal urination.  Endo: Negative for unusual weight change.     Physical Exam   BP (!) 158/82 (BP Location: Right Arm, Patient Position: Sitting, Cuff Size: Normal)   Pulse 75   Temp (!) 97.2 F (36.2 C) (Oral)   Ht '5\' 1"'$  (1.549 m)   Wt 151 lb (68.5 kg)   SpO2 97%   BMI 28.53 kg/m    General: Well-nourished, well-developed in no acute distress.  Eyes: No icterus. Mouth: Oropharyngeal mucosa moist and pink  . Lungs: Clear to auscultation bilaterally.  Heart: Regular rate and rhythm, no murmurs rubs or gallops.  Abdomen: Bowel sounds are normal,   nondistended, no hepatosplenomegaly or masses, no abdominal bruits or hernia , no rebound or guarding. Mild llq tenderness Rectal: not performed Extremities: No lower extremity edema. No clubbing or deformities. Neuro: Alert and oriented x 4   Skin: Warm and dry, no jaundice.   Psych: Alert and cooperative, normal mood and affect.  Labs   Lab Results  Component Value Date   CREATININE 1.20 (H) 11/18/2021   BUN 24 (H) 06/05/2021   NA 137 06/05/2021   K 4.3 06/05/2021   CL 105 06/05/2021   CO2 17 (L) 06/05/2021   Lab Results  Component Value Date   WBC 5.6 06/05/2021   HGB 12.1 06/05/2021   HCT 40.4 06/05/2021   MCV 97.8 06/05/2021   PLT 304 06/05/2021   Lab Results  Component Value Date   ALT 13 06/05/2021   AST 18 06/05/2021   ALKPHOS 62 06/05/2021   BILITOT 0.6 06/05/2021   Lab Results  Component Value Date   TSH 3.532 04/12/2021    Imaging Studies   No  results found.  Assessment   Diarrhea/diverticulitis/colitis: with history of recurrent diverticulitis and colitis, abnormal CTs in 2023 consistent with both entities. CT in 11/2021 for follow up renal lesion, notable patient had persistent colon findings but no GI symptoms at that time. Colonoscopy completed in 08/2021. At this time we will treat for possible postinfectious IBS. Her persisting CT findings could be due to changes from extensive diverticulosis. Unless she has worsening symptoms, I would not anticipate need for colonoscopy to evaluate colon findings on 11/2021 CT as she had similar findings on her CT 06/2021 and no evidence of colitis on 08/2021 colonoscopy. Would consider updating stool studies if she has worsening bowel function.    The patient was found to have elevated blood pressure when vital signs were checked in the office. The blood pressure was rechecked by the nursing staff and it was found be persistently elevated >140/90 mmHg. I personally advised to the  patient to follow up closely with his PCP for hypertension control.   PLAN   Continue fiber daily. Add IBgard. Samples provided. Will start taking 2 each morning. On days with increased stools then take 2 TID.  If IBgard not helpful, we will try low dose bentyl.  Continue keeping stool diarrhea, noting number of stools, consistency, if she takes fiber or IBgard etc. Return ov in 3 months.  Follow up with PCP for elevated BP.    Laureen Ochs. Bobby Rumpf, [place], [facility]

## 2022-03-13 ENCOUNTER — Encounter: Payer: Self-pay | Admitting: Gastroenterology

## 2022-03-18 ENCOUNTER — Telehealth: Payer: Self-pay

## 2022-03-18 NOTE — Telephone Encounter (Signed)
Pt is asking that a new Rx for I-Bgard be sent in to her pharmacy. The samples worked for her.

## 2022-03-18 NOTE — Telephone Encounter (Signed)
Diane Torres, IBgard is bought over the counter. She may have not read her AVS from last week, but it was detailed in there.

## 2022-03-19 MED ORDER — DICYCLOMINE HCL 10 MG PO CAPS: 10.0000 mg | ORAL_CAPSULE | Freq: Two times a day (BID) | ORAL | 1 refills | Status: DC | PRN

## 2022-03-19 NOTE — Addendum Note (Signed)
Addended by: Mahala Menghini on: 03/19/2022 02:37 PM   Modules accepted: Orders

## 2022-03-19 NOTE — Telephone Encounter (Signed)
Phoned and advised the pt of the note and she stated understanding. She also stated  that your note said that she can try Dicyclomine and she would prefer to do that than pay over the counter for IBgard. Please advise.

## 2022-03-19 NOTE — Telephone Encounter (Signed)
Pt advised of Rx being sent in through her MyChart

## 2022-03-19 NOTE — Telephone Encounter (Signed)
I have sent in RX for bentyl to take twice daily as needed for loose stool. Hold if no BM in 24 hours.

## 2022-05-11 ENCOUNTER — Other Ambulatory Visit: Payer: Self-pay

## 2022-05-11 ENCOUNTER — Ambulatory Visit
Admission: RE | Admit: 2022-05-11 | Discharge: 2022-05-11 | Disposition: A | Discharge status: Home / Self Care | Payer: Medicare Other | Source: Ambulatory Visit | Attending: Emergency Medicine | Admitting: Emergency Medicine

## 2022-05-11 VITALS — BP 136/80 | HR 86 | Temp 98.1°F | Resp 20

## 2022-05-11 DIAGNOSIS — J069 Acute upper respiratory infection, unspecified: Secondary | ICD-10-CM | POA: Diagnosis not present

## 2022-05-11 MED ORDER — LIDOCAINE VISCOUS HCL 2 % MT SOLN: 15.0000 mL | OROMUCOSAL | 0 refills | Status: DC | PRN

## 2022-05-11 MED ORDER — AZITHROMYCIN 250 MG PO TABS: 250.0000 mg | ORAL_TABLET | Freq: Every day | ORAL | 0 refills | Status: DC

## 2022-05-11 NOTE — ED Triage Notes (Signed)
Pt reports sore throat since Tuesday and reports symptoms have increased and now also reports nasal congestion, cough, intermittent emesis/diarhea. Pt denies any known fevers.

## 2022-05-11 NOTE — Discharge Instructions (Signed)
Begin use of azithromycin taking as directed to provide coverage for bacteria which may be adding to your illness  You may gargle and spit lidocaine solution every 4 hours as needed to provide temporary relief to your throat  To help with mucus in the throat I recommend you continue to use Mucinex as this helps to thin out secretions making it easier to drain, may also attempt any of the following below    You can take Tylenol and/or Ibuprofen as needed for fever reduction and pain relief.   For cough: honey 1/2 to 1 teaspoon (you can dilute the honey in water or another fluid).  You can also use guaifenesin and dextromethorphan for cough. You can use a humidifier for chest congestion and cough.  If you don't have a humidifier, you can sit in the bathroom with the hot shower running.      For sore throat: try warm salt water gargles, cepacol lozenges, throat spray, warm tea or water with lemon/honey, popsicles or ice, or OTC cold relief medicine for throat discomfort.   For congestion: take a daily anti-histamine like Zyrtec, Claritin, and a oral decongestant, such as pseudoephedrine.  You can also use Flonase 1-2 sprays in each nostril daily.   It is important to stay hydrated: drink plenty of fluids (water, gatorade/powerade/pedialyte, juices, or teas) to keep your throat moisturized and help further relieve irritation/discomfort.

## 2022-05-11 NOTE — ED Provider Notes (Signed)
[facility]    CSN: [id number] Arrival date & time: 05/11/22  0836      History   Chief Complaint Chief Complaint  Patient presents with   Sore Throat    Congestion - Entered by patient    HPI Diane Torres is a 82 y.o. female.    Patient presents for evaluation of nasal congestion, rhinorrhea, sore throat and a nonproductive cough present for 6 days.  No known sick contacts prior.  Decreased appetite but tolerating some food and liquids.  Has attempted use of Mucinex which has been ineffective.  Denies fevers, shortness of breath or wheezing.  Denies respiratory history, non-smoker.     Past Medical History:  Diagnosis Date   CAP (community acquired pneumonia) 05/2018   Diverticulitis    CT verified in 2015, 2018, 2019, 2020, 2023   GERD (gastroesophageal reflux disease)    Hypertension    Hypothyroidism    Impaired fasting glucose    Mixed hyperlipidemia     Patient Active Problem List   Diagnosis Date Noted   Vaginal yeast infection 04/22/2021   Unspecified protein-calorie malnutrition 04/17/2021   Abnormal breath sounds 04/17/2021   Chronic non-seasonal allergic rhinitis 04/16/2021   Metabolic acidosis, normal anion gap (NAG) 04/09/2021   Renal cyst, right 04/09/2021   Hyponatremia 04/09/2021   Normocytic anemia 04/08/2021   Generalized weakness 04/08/2021   Sepsis 04/07/2021   Acute respiratory failure with hypoxia 04/07/2021   Hyperkalemia 04/07/2021   AKI (acute kidney injury) 04/06/2021   Pituitary mass 10/28/2020   Intracranial mass 10/28/2020   Macrocytic anemia 10/28/2020   Chronic back pain 10/28/2020   Essential hypertension 10/28/2020   Syncope 10/27/2020   Diarrhea 12/02/2018   Diverticulosis 12/02/2018   Diverticulitis 06/30/2017   Rectal bleeding 06/30/2017   Leukocytosis 06/30/2017   CKD (chronic kidney disease) stage 3, GFR 30-59 ml/min 06/30/2017   LLQ pain 12/23/2016   GERD (gastroesophageal reflux disease) 06/16/2016    Gastroesophageal reflux disease with esophagitis 06/16/2016   Precordial pain 03/10/2013   Impaired fasting glucose 03/09/2013   Mixed hyperlipidemia 03/09/2013   Hypothyroidism 03/09/2013    Past Surgical History:  Procedure Laterality Date   COLONOSCOPY     COLONOSCOPY N/A 12/12/2015   Dr. Jena Gaussourk: one 3 mm tubular adenoma in rectum s/p removal. Diverticulosis in entire colon. Internal hemorrhoids.    COLONOSCOPY WITH PROPOFOL N/A 08/21/2021   Procedure: COLONOSCOPY WITH PROPOFOL;  Surgeon: Corbin Adeourk, Robert M, MD;  [facility];  Service: Endoscopy;  Laterality: N/A;  10:15am   ESOPHAGOGASTRODUODENOSCOPY N/A 03/19/2016   Dr. Jena Gaussourk: LA Grade A esophagitis, empiric dilation, normal stomach, normal duodenum   Left lobe thyroidectomy     MALONEY DILATION N/A 03/19/2016   Procedure: Elease HashimotoMALONEY DILATION;  Surgeon: Corbin Adeobert M Rourk, MD;  [facility]: [facility];  Service: Endoscopy;  Laterality: N/A;   POLYPECTOMY  12/12/2015   Procedure: POLYPECTOMY;  Surgeon: Corbin Adeobert M Rourk, MD;  [facility];  Service: Endoscopy;;  colon   POLYPECTOMY  08/21/2021   Procedure: POLYPECTOMY;  Surgeon: Corbin Adeourk, Robert M, MD;  [facility]: [facility];  Service: Endoscopy;;  splenic flexure;   RECTOCELE REPAIR     TOTAL ABDOMINAL HYSTERECTOMY W/ BILATERAL SALPINGOOPHORECTOMY      OB History     Gravida  2   Para  2   Term  2   Preterm      AB      Living  2  SAB      IAB      Ectopic      Multiple      Live Births               Home Medications    Prior to Admission medications   Medication Sig Start Date End Date Taking? Authorizing Provider  ketotifen (ALAWAY) 0.025 % ophthalmic solution Place 1 drop into both eyes daily as needed (allergies).   Yes [provider]  traMADol (ULTRAM) 50 MG tablet Take 50 mg by mouth 2 (two) times daily as needed. 03/10/22  Yes [provider]  Wheat Dextrin (BENEFIBER PO) Take 1 Dose by mouth daily.   Yes [provider]  acetaminophen (TYLENOL) 500 MG tablet Take 1 tablet (500 mg total) by mouth every 6 (six) hours as needed. Patient taking differently: Take 1,000 mg by mouth every 6 (six) hours as needed for moderate pain. 10/29/20   Marguerita Merles Latif, DO  dicyclomine (BENTYL) 10 MG capsule Take 1 capsule (10 mg total) by mouth 2 (two) times daily as needed (loose stool). Hold if no bowel movement in 24 hours. 03/19/22   Tiffany Kocher, PA-C  loratadine (CLARITIN) 10 MG tablet Take 10 mg by mouth daily.    [provider]  losartan (COZAAR) 100 MG tablet Take 1 tablet (100 mg total) by mouth daily. 04/24/21   Sharee Holster, NP  pantoprazole (PROTONIX) 40 MG tablet Take 40 mg by mouth daily.    [provider]  simvastatin (ZOCOR) 40 MG tablet Take 1 tablet (40 mg total) by mouth at bedtime. 04/24/21   Sharee Holster, NP    Family History Family History  Problem Relation Age of Onset   CAD Brother        2 brothers with CAD in their 87s   Crohn's disease Brother        had colon resection   Pancreatic cancer Brother    CAD Father    Heart failure Mother    Diabetes Mellitus II Mother    Colon cancer Neg Hx     Social History Social History   Tobacco Use   Smoking status: Never   Smokeless tobacco: Never  Vaping Use   Vaping Use: Never used  Substance Use Topics   Alcohol use: No   Drug use: No     Allergies   Bee venom   Review of Systems Review of Systems  Constitutional: Negative.   HENT:  Positive for congestion, rhinorrhea and sore throat. Negative for dental problem, drooling, ear discharge, ear pain, facial swelling, hearing loss, mouth sores, nosebleeds, postnasal drip, sinus pressure, sinus pain, sneezing, tinnitus, trouble swallowing and voice change.   Respiratory:  Positive for cough. Negative for apnea, choking, chest tightness, shortness of breath, wheezing and stridor.   Cardiovascular: Negative.   Gastrointestinal: Negative.    Neurological: Negative.      Physical Exam Triage Vital Signs ED Triage Vitals  Enc Vitals Group     BP 05/11/22 0845 136/80     Pulse Rate 05/11/22 0845 86     Resp 05/11/22 0845 20     Temp 05/11/22 0845 98.1 F (36.7 C)     Temp Source 05/11/22 0845 Oral     SpO2 05/11/22 0845 96 %     Weight --      Height --      Head Circumference --      Peak Flow --  Pain Score 05/11/22 0842 0     Pain Loc --      Pain Edu? --      Excl. in GC? --    No data found.  Updated Vital Signs BP 136/80 (BP Location: Right Arm)   Pulse 86   Temp 98.1 F (36.7 C) (Oral)   Resp 20   SpO2 96%   Visual Acuity Right Eye Distance:   Left Eye Distance:   Bilateral Distance:    Right Eye Near:   Left Eye Near:    Bilateral Near:     Physical Exam Constitutional:      Appearance: Normal appearance. She is well-developed.  HENT:     Head: Normocephalic.     Right Ear: Tympanic membrane and ear canal normal.     Left Ear: Tympanic membrane and ear canal normal.     Nose: Congestion present. No rhinorrhea.     Mouth/Throat:     Pharynx: No posterior oropharyngeal erythema.     Tonsils: No tonsillar exudate. 0 on the right. 1+ on the left.  Eyes:     Extraocular Movements: Extraocular movements intact.  Cardiovascular:     Rate and Rhythm: Normal rate and regular rhythm.     Pulses: Normal pulses.     Heart sounds: Normal heart sounds.  Pulmonary:     Effort: Pulmonary effort is normal.     Breath sounds: Normal breath sounds.  Musculoskeletal:     Cervical back: Normal range of motion and neck supple.  Skin:    General: Skin is warm and dry.  Neurological:     General: No focal deficit present.     Mental Status: She is alert and oriented to person, place, and time.  Psychiatric:        Mood and Affect: Mood normal.        Behavior: Behavior normal.      UC Treatments / Results  Labs (all labs ordered are listed, but only abnormal results are displayed) Labs  Reviewed - No data to display  EKG   Radiology No results found.  Procedures Procedures (including critical care time)  Medications Ordered in UC Medications - No data to display  Initial Impression / Assessment and Plan / UC Course  I have reviewed the triage vital signs and the nursing notes.  Pertinent labs & imaging results that were available during my care of the patient were reviewed by me and considered in my medical decision making (see chart for details).  Acute upper respiratory infection  Patient is in no signs of distress nor toxic appearing.  Vital signs are stable.  Low suspicion for pneumonia, pneumothorax or bronchitis and therefore will defer imaging.  Viral testing deferred due to timeline of illness.  Prescribed azithromycin prophylactically as well as viscous lidocaine for comfort to the throat as it is the most worrisome symptom.  Endorses sensation of mucus to the throat and recommended continued use of Mucinex for treatment.May use additional over-the-counter medications as needed for supportive care.  May follow-up with urgent care as needed if symptoms persist or worsen.    Final Clinical Impressions(s) / UC Diagnoses   Final diagnoses:  None   Discharge Instructions   None    ED Prescriptions   None    PDMP not reviewed this encounter.   Valinda Hoar, [place] 05/11/22 [phone]

## 2022-06-15 NOTE — Progress Notes (Unsigned)
GI Office Note    Referring Provider: Benita Stabile, MD Primary Care Physician:  Benita Stabile, MD  Primary Gastroenterologist: Roetta Sessions, MD   Chief Complaint   No chief complaint on file.   History of Present Illness   Diane Torres is a 82 y.o. female presenting today for follow-up.  Last seen in February.  She has a history of recurrent diverticulitis/colitis.  Family history of Crohn's colitis, brother.  She takes Metamucil 1 tablespoon daily.  Typically at least 5 days/week, does not take on the weekends because of church.  She does not eat before church or morning office visits due to postprandial bowel movements.  She has postprandial urgency with eating certain foods such as chocolate ice cream, chocolate candy, peanuts, seeds, oatmeal, cabbage, lettuce, corn.  On days she has a lot of stools, she also has left lower quadrant pain.    CT A/P with and without contrast 11/2021: IMPRESSION: 1. Unchanged, intermediate attenuation, nonenhancing lesion of the midportion of the right kidney measuring 1.2 x 1.1 cm. This is consistent with a benign hemorrhagic or proteinaceous cyst. No further routine follow-up is required for this lesion. 2. Similar appearance of diffuse colon wall thickening, hyperenhancement, and vascular combing, again suggesting nonspecific infectious or inflammatory colitis, or perhaps chronic sequelae given persistence. As previously reported, there is substantial pancolonic diverticulosis, severe in the sigmoid, however no focal findings to suggest that this processes reflects acute diverticulitis. 3. Coronary artery disease.   Colonoscopy 08/2021:  -scattered diverticulosis -6mm polyp removed (inflammatory)     Medications   Current Outpatient Medications  Medication Sig Dispense Refill   acetaminophen (TYLENOL) 500 MG tablet Take 1 tablet (500 mg total) by mouth every 6 (six) hours as needed. (Patient taking differently: Take 1,000 mg  by mouth every 6 (six) hours as needed for moderate pain.) 30 tablet 0   azithromycin (ZITHROMAX) 250 MG tablet Take 1 tablet (250 mg total) by mouth daily. Take first 2 tablets together, then 1 every day until finished. 6 tablet 0   dicyclomine (BENTYL) 10 MG capsule Take 1 capsule (10 mg total) by mouth 2 (two) times daily as needed (loose stool). Hold if no bowel movement in 24 hours. 60 capsule 1   ketotifen (ALAWAY) 0.025 % ophthalmic solution Place 1 drop into both eyes daily as needed (allergies).     lidocaine (XYLOCAINE) 2 % solution Use as directed 15 mLs in the mouth or throat every 4 (four) hours as needed for mouth pain. 100 mL 0   loratadine (CLARITIN) 10 MG tablet Take 10 mg by mouth daily.     losartan (COZAAR) 100 MG tablet Take 1 tablet (100 mg total) by mouth daily. 30 tablet 0   pantoprazole (PROTONIX) 40 MG tablet Take 40 mg by mouth daily.     simvastatin (ZOCOR) 40 MG tablet Take 1 tablet (40 mg total) by mouth at bedtime. 30 tablet 0   traMADol (ULTRAM) 50 MG tablet Take 50 mg by mouth 2 (two) times daily as needed.     Wheat Dextrin (BENEFIBER PO) Take 1 Dose by mouth daily.     No current facility-administered medications for this visit.    Allergies   Allergies as of 06/16/2022 - Review Complete 05/11/2022  Allergen Reaction Noted   Bee venom Anaphylaxis 03/18/2016     Past Medical History   Past Medical History:  Diagnosis Date   CAP (community acquired pneumonia) 05/2018   Diverticulitis  CT verified in 2015, 2018, 2019, 2020, 2023   GERD (gastroesophageal reflux disease)    Hypertension    Hypothyroidism    Impaired fasting glucose    Mixed hyperlipidemia     Past Surgical History   Past Surgical History:  Procedure Laterality Date   COLONOSCOPY     COLONOSCOPY N/A 12/12/2015   Dr. Jena Gauss: one 3 mm tubular adenoma in rectum s/p removal. Diverticulosis in entire colon. Internal hemorrhoids.    COLONOSCOPY WITH PROPOFOL N/A 08/21/2021    Procedure: COLONOSCOPY WITH PROPOFOL;  Surgeon: Corbin Ade, MD;  [facility];  Service: Endoscopy;  Laterality: N/A;  10:15am   ESOPHAGOGASTRODUODENOSCOPY N/A 03/19/2016   Dr. Jena Gauss: LA Grade A esophagitis, empiric dilation, normal stomach, normal duodenum   Left lobe thyroidectomy     MALONEY DILATION N/A 03/19/2016   Procedure: [name] DILATION;  Surgeon: Corbin Ade, MD;  [facility];  Service: Endoscopy;  Laterality: N/A;   POLYPECTOMY  12/12/2015   Procedure: POLYPECTOMY;  Surgeon: Corbin Ade, MD;  [facility];  Service: Endoscopy;;  colon   POLYPECTOMY  08/21/2021   Procedure: POLYPECTOMY;  Surgeon: Corbin Ade, MD;  [facility];  Service: Endoscopy;;  splenic flexure;   RECTOCELE REPAIR     TOTAL ABDOMINAL HYSTERECTOMY W/ BILATERAL SALPINGOOPHORECTOMY      Past Family History   Family History  Problem Relation Age of Onset   CAD Brother        2 brothers with CAD in their 67s   Crohn's disease Brother        had colon resection   Pancreatic cancer Brother    CAD Father    Heart failure Mother    Diabetes Mellitus II Mother    Colon cancer Neg Hx     Past Social History   Social History   Socioeconomic History   Marital status: Married    Spouse name: Not on file   Number of children: Not on file   Years of education: Not on file   Highest education level: Not on file  Occupational History   Not on file  Tobacco Use   Smoking status: Never   Smokeless tobacco: Never  Vaping Use   Vaping Use: Never used  Substance and Sexual Activity   Alcohol use: No   Drug use: No   Sexual activity: Yes  Other Topics Concern   Not on file  Social History Narrative   Not on file   Social Determinants of Health   Financial Resource Strain: Not on file  Food Insecurity: Not on file  Transportation Needs: Not on file  Physical Activity: Not on file  Stress: Not on file  Social Connections: Not on file   Intimate Partner Violence: Not on file    Review of Systems   General: Negative for anorexia, weight loss, fever, chills, fatigue, weakness. ENT: Negative for hoarseness, difficulty swallowing , nasal congestion. CV: Negative for chest pain, angina, palpitations, dyspnea on exertion, peripheral edema.  Respiratory: Negative for dyspnea at rest, dyspnea on exertion, cough, sputum, wheezing.  GI: See history of present illness. GU:  Negative for dysuria, hematuria, urinary incontinence, urinary frequency, nocturnal urination.  Endo: Negative for unusual weight change.     Physical Exam   There were no vitals taken for this visit.   General: Well-nourished, well-developed in no acute distress.  Eyes: No icterus. Mouth: Oropharyngeal mucosa moist and pink , no lesions erythema  or exudate. Lungs: Clear to auscultation bilaterally.  Heart: Regular rate and rhythm, no murmurs rubs or gallops.  Abdomen: Bowel sounds are normal, nontender, nondistended, no hepatosplenomegaly or masses,  no abdominal bruits or hernia , no rebound or guarding.  Rectal: ***  Extremities: No lower extremity edema. No clubbing or deformities. Neuro: Alert and oriented x 4   Skin: Warm and dry, no jaundice.   Psych: Alert and cooperative, normal mood and affect.  Labs   *** Imaging Studies   No results found.  Assessment       PLAN   ***   Laureen Ochs. Bobby Rumpf, [place], [facility]

## 2022-06-16 ENCOUNTER — Encounter: Payer: Self-pay | Admitting: Gastroenterology

## 2022-06-16 ENCOUNTER — Ambulatory Visit: Payer: Medicare Other | Admitting: Gastroenterology

## 2022-06-16 VITALS — BP 140/78 | HR 84 | Temp 97.4°F | Ht 61.0 in | Wt 147.0 lb

## 2022-06-16 DIAGNOSIS — R197 Diarrhea, unspecified: Secondary | ICD-10-CM | POA: Diagnosis not present

## 2022-06-16 MED ORDER — FLUCONAZOLE 150 MG PO TABS: 150.0000 mg | ORAL_TABLET | Freq: Every day | ORAL | 0 refills | Status: DC

## 2022-06-16 NOTE — Patient Instructions (Addendum)
Continue bentyl twice daily as needed. Continue pantoprazole once daily before breakfast. RX for diflucan 150mg  by mouth once, may repeat in 72 hours if needed. PLEASE HOLD ZOCOR FOR ONE WEEK WHILE ON DIFLUCAN. Return office visit in 3 months. Reach out sooner if needed.

## 2022-08-19 ENCOUNTER — Encounter: Payer: Self-pay | Admitting: Gastroenterology

## 2022-10-07 ENCOUNTER — Ambulatory Visit: Payer: Medicare Other | Admitting: Gastroenterology

## 2022-10-07 NOTE — Progress Notes (Signed)
GI Office Note    Referring Provider: Benita Stabile, MD Primary Care Physician:  Benita Stabile, MD  Primary Gastroenterologist: Roetta Sessions, MD   Chief Complaint   Chief Complaint  Patient presents with   Follow-up    Doing well, no issues. States only had one episode of diarrhea.     History of Present Illness   Diane Torres is a 82 y.o. female presenting today for follow up. Last seen 06/2022. H/o recurrent diverticulitis/colitis. FH of Crohn's colitis, brother.   Overall doing well from a GI standpoint.  Has had single episode of diarrhea since her last office visit, lasting for 24 hours.  For the most part she has more regular bowel movements. Avoiding food triggers. Denies any abdominal pain.  Has taken dicyclomine infrequently.  Continues to follow with urology for surveillance of kidney lesion.  Scheduled for renal ultrasound in November.  Appetite has been fair.  No heartburn, dysphagia, nausea/vomiting.       CT A/P with and without contrast 11/2021: IMPRESSION: 1. Unchanged, intermediate attenuation, nonenhancing lesion of the midportion of the right kidney measuring 1.2 x 1.1 cm. This is consistent with a benign hemorrhagic or proteinaceous cyst. No further routine follow-up is required for this lesion. 2. Similar appearance of diffuse colon wall thickening, hyperenhancement, and vascular combing, again suggesting nonspecific infectious or inflammatory colitis, or perhaps chronic sequelae given persistence. As previously reported, there is substantial pancolonic diverticulosis, severe in the sigmoid, however no focal findings to suggest that this processes reflects acute diverticulitis. 3. Coronary artery disease.   Colonoscopy 08/2021:  -scattered diverticulosis -6mm polyp removed (inflammatory)  EGD 03/2016: -LA Grade A esophagitis -dilation of esophagus   Medications   Current Outpatient Medications  Medication Sig Dispense Refill   acetaminophen  (TYLENOL) 500 MG tablet Take 1 tablet (500 mg total) by mouth every 6 (six) hours as needed. (Patient taking differently: Take 1,000 mg by mouth every 6 (six) hours as needed for moderate pain.) 30 tablet 0   dicyclomine (BENTYL) 10 MG capsule Take 1 capsule (10 mg total) by mouth 2 (two) times daily as needed (loose stool). Hold if no bowel movement in 24 hours. 60 capsule 1   escitalopram (LEXAPRO) 5 MG tablet Take 5 mg by mouth daily.     loratadine (CLARITIN) 10 MG tablet Take 10 mg by mouth daily.     losartan (COZAAR) 100 MG tablet Take 1 tablet (100 mg total) by mouth daily. 30 tablet 0   pantoprazole (PROTONIX) 40 MG tablet Take 40 mg by mouth daily.     simvastatin (ZOCOR) 40 MG tablet Take 1 tablet (40 mg total) by mouth at bedtime. 30 tablet 0   No current facility-administered medications for this visit.    Allergies   Allergies as of 10/08/2022 - Review Complete 10/08/2022  Allergen Reaction Noted   Bee venom Anaphylaxis 03/18/2016         Review of Systems   General: Negative for anorexia, weight loss, fever, chills, fatigue, weakness. ENT: Negative for hoarseness, difficulty swallowing , nasal congestion. CV: Negative for chest pain, angina, palpitations, dyspnea on exertion, peripheral edema.  Respiratory: Negative for dyspnea at rest, dyspnea on exertion, cough, sputum, wheezing.  GI: See history of present illness. GU:  Negative for dysuria, hematuria, urinary incontinence, urinary frequency, nocturnal urination.  Endo: Negative for unusual weight change.     Physical Exam   BP (!) 166/76 (BP Location: Right Arm, Patient Position: Sitting,  Cuff Size: Normal)   Pulse 67   Temp (!) 97.5 F (36.4 C) (Oral)   Ht 4' 11.5" (1.511 m)   Wt 144 lb 12.8 oz (65.7 kg)   SpO2 98%   BMI 28.76 kg/m    General: Well-nourished, well-developed in no acute distress.  Eyes: No icterus. Mouth: Oropharyngeal mucosa moist and pink   Lungs: Clear to auscultation bilaterally.   Heart: Regular rate and rhythm, no murmurs rubs or gallops.  Abdomen: Bowel sounds are normal, nontender, nondistended, no hepatosplenomegaly or masses,  no abdominal bruits or hernia , no rebound or guarding.  Rectal: Not performed Extremities: No lower extremity edema. No clubbing or deformities. Neuro: Alert and oriented x 4   Skin: Warm and dry, no jaundice.   Psych: Alert and cooperative, normal mood and affect.  Labs   Labs dated August 05, 2022: White blood cell count 9500, hemoglobin 11.7, platelets 308,000, BUN 30, creatinine 1.38, albumin 4.6, total bilirubin 0.3, alkaline phosphatase 70, AST 16, ALT 11, A1c 6.3.  Imaging Studies   No results found.  Assessment/Plan:   *Diverticulosis with history of diverticulitis/colitis *GERD *Diarrhea/IBS  Doing well. Stools have stabilized. No further abdominal pain. Will continue to follow clinically. She can use dicyclomine twice daily as needed for episodes of loose stools. Will plan to see her back in one year unless she has recurrence of symptoms.   Continue pantoprazole for acid reflux.  The patient was found to have elevated blood pressure when vital signs were checked in the office. The blood pressure was rechecked by the nursing staff and it was found be persistently elevated >140/90 mmHg. I personally advised to the patient to follow up closely with his PCP for hypertension control.      Diane Torres. Melvyn Neth, MHS, PA-C [facility]

## 2022-10-08 ENCOUNTER — Encounter: Payer: Self-pay | Admitting: Gastroenterology

## 2022-10-08 ENCOUNTER — Ambulatory Visit: Payer: Medicare Other | Admitting: Gastroenterology

## 2022-10-08 VITALS — BP 166/76 | HR 67 | Temp 97.5°F | Ht 59.5 in | Wt 144.8 lb

## 2022-10-08 DIAGNOSIS — K5790 Diverticulosis of intestine, part unspecified, without perforation or abscess without bleeding: Secondary | ICD-10-CM

## 2022-10-08 DIAGNOSIS — R197 Diarrhea, unspecified: Secondary | ICD-10-CM | POA: Diagnosis not present

## 2022-10-08 DIAGNOSIS — K2100 Gastro-esophageal reflux disease with esophagitis, without bleeding: Secondary | ICD-10-CM | POA: Diagnosis not present

## 2022-10-08 NOTE — Patient Instructions (Signed)
Continue dicyclomine twice daily as needed for diarrhea. Continue pantoprazole 40mg  daily for acid reflux. We will plan to see you back in one year for routine follow up but please call sooner if you have any questions or concerns. You can reach my CMA Tammy at [phone].

## 2022-12-12 ENCOUNTER — Ambulatory Visit (HOSPITAL_COMMUNITY)
Admission: RE | Admit: 2022-12-12 | Discharge: 2022-12-12 | Disposition: A | Discharge status: Home / Self Care | Payer: Medicare Other | Source: Ambulatory Visit | Attending: Urology | Admitting: Urology

## 2022-12-12 DIAGNOSIS — N281 Cyst of kidney, acquired: Secondary | ICD-10-CM | POA: Insufficient documentation

## 2022-12-22 ENCOUNTER — Ambulatory Visit: Payer: Medicare Other | Admitting: Urology

## 2022-12-22 ENCOUNTER — Encounter: Payer: Self-pay | Admitting: Urology

## 2022-12-22 VITALS — BP 152/72 | HR 96

## 2022-12-22 DIAGNOSIS — Z87898 Personal history of other specified conditions: Secondary | ICD-10-CM

## 2022-12-22 DIAGNOSIS — Z87448 Personal history of other diseases of urinary system: Secondary | ICD-10-CM

## 2022-12-22 DIAGNOSIS — Z09 Encounter for follow-up examination after completed treatment for conditions other than malignant neoplasm: Secondary | ICD-10-CM | POA: Diagnosis not present

## 2022-12-22 DIAGNOSIS — N281 Cyst of kidney, acquired: Secondary | ICD-10-CM

## 2022-12-22 DIAGNOSIS — R3129 Other microscopic hematuria: Secondary | ICD-10-CM

## 2022-12-22 NOTE — Progress Notes (Unsigned)
12/22/2022 1:31 PM   Diane Torres 10-07-1940 [id number]  Referring provider: Benita Stabile, MD [address] [place],  [place] [address]  No chief complaint on file.   HPI:  F/u -    1) microscopic hematuria-in 2019 she had a UA with 21-50 red cells.  More recently it has been clear.  She has had some back pain.  A July 2022 KUB was negative. August 2022 L-spine MRI showed L1-L2 and L2-L3 disease.  There was no hydronephrosis on the visualized portion of the kidneys. She has had no gross hematuria. No h/o kidney stones. Non-smoker. 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    She was admitted to hospital September 2022 with neurologic issues.  CT head showed a 2.2 cm pituitary mass and 2.5 cm left frontal extra-axial mass most in keeping with a meningioma demonstrating minimal mass-effect upon the right frontal lobe.    She underwent a CT scan of your abdomen and pelvis December 17, 2020 which was benign.  She did have a cystocele. Cystoscopy was benign Nov 2022. CT B9 Mar and Nov 2023.    2) right renal cyst - noted on 2022 CT and stable 11/19/2021. CT benign 2023-04-10and Oct  2023  -  images reviewed.   Today, seen for the above. She has no voiding complaints.  She underwent Nov 2024 renal US which was benign and no cyst was noted. No voiding complaints.   Her husband died 2022/05/14. Suddenly after a cancer dx. Married 61 years.    PMH: Past Medical History:  Diagnosis Date   CAP (community acquired pneumonia) 05/2018   Diverticulitis    CT verified in 2015, 2018, 2019, 2020, 2023   GERD (gastroesophageal reflux disease)    Hypertension    Hypothyroidism    Impaired fasting glucose    Mixed hyperlipidemia     Surgical History: Past Surgical History:  Procedure Laterality Date   COLONOSCOPY     COLONOSCOPY N/A 12/12/2015   Dr. Jena Gauss: one 3 mm tubular adenoma in rectum s/p removal. Diverticulosis in entire colon. Internal hemorrhoids.     COLONOSCOPY WITH PROPOFOL N/A 08/21/2021   Procedure: COLONOSCOPY WITH PROPOFOL;  Surgeon: Corbin Ade, MD;  [facility];  Service: Endoscopy;  Laterality: N/A;  10:15am   ESOPHAGOGASTRODUODENOSCOPY N/A 03/19/2016   Dr. Jena Gauss: LA Grade A esophagitis, empiric dilation, normal stomach, normal duodenum   Left lobe thyroidectomy     MALONEY DILATION N/A 03/19/2016   Procedure: [name] DILATION;  Surgeon: Corbin Ade, MD;  [facility];  Service: Endoscopy;  Laterality: N/A;   POLYPECTOMY  12/12/2015   Procedure: POLYPECTOMY;  Surgeon: Corbin Ade, MD;  [facility];  Service: Endoscopy;;  colon   POLYPECTOMY  08/21/2021   Procedure: POLYPECTOMY;  Surgeon: Corbin Ade, MD;  [facility];  Service: Endoscopy;;  splenic flexure;   RECTOCELE REPAIR     TOTAL ABDOMINAL HYSTERECTOMY W/ BILATERAL SALPINGOOPHORECTOMY      Home Medications:  Allergies as of 12/22/2022       Reactions   Bee Venom Anaphylaxis        Medication List        Accurate as of December 22, 2022  1:31 PM. If you have any questions, ask your nurse or doctor.          acetaminophen 500 MG tablet Commonly known as: TYLENOL Take 1  tablet (500 mg total) by mouth every 6 (six) hours as needed.   dicyclomine 10 MG capsule Commonly known as: BENTYL Take 1 capsule (10 mg total) by mouth 2 (two) times daily as needed (loose stool). Hold if no bowel movement in 24 hours.   escitalopram 5 MG tablet Commonly known as: LEXAPRO Take 5 mg by mouth every other day.   loratadine 10 MG tablet Commonly known as: CLARITIN Take 10 mg by mouth daily.   losartan 100 MG tablet Commonly known as: COZAAR Take 1 tablet (100 mg total) by mouth daily.   pantoprazole 40 MG tablet Commonly known as: PROTONIX Take 40 mg by mouth daily.   simvastatin 40 MG tablet Commonly known as: ZOCOR Take 1 tablet (40 mg total) by mouth at bedtime.        Allergies:  Allergies   Allergen Reactions   Bee Venom Anaphylaxis    Family History: Family History  Problem Relation Age of Onset   CAD Brother        2 brothers with CAD in their 10s   Crohn's disease Brother        had colon resection   Pancreatic cancer Brother    CAD Father    Heart failure Mother    Diabetes Mellitus II Mother    Colon cancer Neg Hx     Social History:  reports that she has never smoked. She has never used smokeless tobacco. She reports that she does not drink alcohol and does not use drugs.   Physical Exam: BP (!) 152/72   Pulse 96   Constitutional:  Alert and oriented, No acute distress. HEENT: [place] AT, moist mucus membranes.  Trachea midline, no masses. Cardiovascular: No clubbing, cyanosis, or edema. Respiratory: Normal respiratory effort, no increased work of breathing. GI: Abdomen is soft, nontender, nondistended, no abdominal masses GU: No CVA tenderness Skin: No rashes, bruises or suspicious lesions. Neurologic: Grossly intact, no focal deficits, moving all 4 extremities. Psychiatric: Normal mood and affect.  Laboratory Data: Lab Results  Component Value Date   WBC 5.6 06/05/2021   HGB 12.1 06/05/2021   HCT 40.4 06/05/2021   MCV 97.8 06/05/2021   PLT 304 06/05/2021    Lab Results  Component Value Date   CREATININE 1.20 (H) 11/18/2021    No results found for: "PSA"  No results found for: "TESTOSTERONE"  Lab Results  Component Value Date   HGBA1C 6.0 (H) 04/15/2021    Urinalysis    Component Value Date/Time   COLORURINE STRAW (A) 06/05/2021 1305   APPEARANCEUR Clear 12/23/2021 1342   LABSPEC 1.019 06/05/2021 1305   PHURINE 6.0 06/05/2021 1305   GLUCOSEU Negative 12/23/2021 1342   HGBUR SMALL (A) 06/05/2021 1305   BILIRUBINUR Negative 12/23/2021 1342   KETONESUR 20 (A) 06/05/2021 1305   PROTEINUR Negative 12/23/2021 1342   PROTEINUR NEGATIVE 06/05/2021 1305   UROBILINOGEN 0.2 09/07/2006 1322   NITRITE Negative 12/23/2021 1342   NITRITE  NEGATIVE 06/05/2021 1305   LEUKOCYTESUR 1+ (A) 12/23/2021 1342   LEUKOCYTESUR MODERATE (A) 06/05/2021 1305    Lab Results  Component Value Date   LABMICR See below: 12/23/2021   WBCUA 0-5 12/23/2021   LABEPIT 0-10 12/23/2021   BACTERIA Few 12/23/2021    Pertinent Imaging:  Results for orders placed during the hospital encounter of 08/07/20  DG Abd 1 View  Narrative CLINICAL DATA:  Acute right flank pain.  EXAM: ABDOMEN - 1 VIEW  COMPARISON:  None.  FINDINGS: The bowel  gas pattern is normal. No radio-opaque calculi or other significant radiographic abnormality are seen.  IMPRESSION: Negative.   Electronically Signed By: Lupita Raider M.D. On: 08/08/2020 08:58  No results found for this or any previous visit.  No results found for this or any previous visit.  No results found for this or any previous visit.  Results for orders placed during the hospital encounter of 12/12/22  US RENAL  Narrative CLINICAL DATA:  renal cyst  EXAM: RENAL / URINARY TRACT ULTRASOUND COMPLETE  COMPARISON:  04/07/2021.  FINDINGS: The right kidney measured 8.6 cm and the left kidney measured 8.6 cm. The kidneys demonstrate normal echogenicity. No renal parenchymal lesions are identified. No shadowing stones are seen. No hydronephrosis.  Bladder:  Appears normal for degree of bladder distention.  IMPRESSION: Unremarkable examination of the kidneys and bladder.   Electronically Signed By: Layla Maw M.D. On: 12/13/2022 18:27  No valid procedures specified. No results found for this or any previous visit.  No results found for this or any previous visit.  CT reviewed from 2023, 2022   Assessment & Plan:    1. Microhematuria Benign eval and imaging . No MH on UA last year.   2. Renal cyst - none noted on recent imaging.   No follow-ups on file.  Jerilee Field, MD  [facility]  [address] [place], [place]  [address] [phone]

## 2022-12-23 ENCOUNTER — Other Ambulatory Visit (HOSPITAL_COMMUNITY): Payer: Self-pay | Admitting: Physical Medicine and Rehabilitation

## 2022-12-23 DIAGNOSIS — M545 Low back pain, unspecified: Secondary | ICD-10-CM

## 2022-12-25 ENCOUNTER — Encounter: Payer: Self-pay | Admitting: Ophthalmology

## 2022-12-31 ENCOUNTER — Ambulatory Visit (HOSPITAL_COMMUNITY)
Admission: RE | Admit: 2022-12-31 | Discharge: 2022-12-31 | Disposition: A | Discharge status: Home / Self Care | Payer: Medicare Other | Source: Ambulatory Visit | Attending: Physical Medicine and Rehabilitation | Admitting: Physical Medicine and Rehabilitation

## 2022-12-31 DIAGNOSIS — M545 Low back pain, unspecified: Secondary | ICD-10-CM | POA: Diagnosis present

## 2023-01-02 ENCOUNTER — Ambulatory Visit
Admission: RE | Admit: 2023-01-02 | Discharge: 2023-01-02 | Disposition: A | Discharge status: Home / Self Care | Payer: Medicare Other | Source: Ambulatory Visit | Attending: Family Medicine | Admitting: Family Medicine

## 2023-01-02 VITALS — BP 199/73 | HR 71 | Temp 98.0°F | Resp 20

## 2023-01-02 DIAGNOSIS — J069 Acute upper respiratory infection, unspecified: Secondary | ICD-10-CM | POA: Diagnosis not present

## 2023-01-02 LAB — POC COVID19/FLU A&B COMBO
Covid Antigen, POC: NEGATIVE
Influenza A Antigen, POC: NEGATIVE
Influenza B Antigen, POC: NEGATIVE

## 2023-01-02 MED ORDER — BENZONATATE 100 MG PO CAPS: 100.0000 mg | ORAL_CAPSULE | Freq: Three times a day (TID) | ORAL | 0 refills | Status: DC

## 2023-01-02 NOTE — ED Provider Notes (Signed)
[facility]    CSN: [id number] Arrival date & time: 01/02/23  1725      History   Chief Complaint Chief Complaint  Patient presents with   Influenza    Under the weather - Entered by patient    HPI Diane Torres is a 82 y.o. female.   Patient presenting today with 1 day history of runny nose, sinus drainage, scratchy throat.  Denies fever, chills, chest pain, shortness of breath, abdominal pain, nausea vomiting or diarrhea.  So far took a Mucinex with minimal relief.  No known sick contacts recently.  No known history of chronic pulmonary disease.    Past Medical History:  Diagnosis Date   CAP (community acquired pneumonia) 05/2018   Diverticulitis    CT verified in 2015, 2018, 2019, 2020, 2023   GERD (gastroesophageal reflux disease)    Hypertension    Hypothyroidism    Impaired fasting glucose    Mixed hyperlipidemia     Patient Active Problem List   Diagnosis Date Noted   Vaginal yeast infection 04/22/2021   Unspecified protein-calorie malnutrition (HCC) 04/17/2021   Abnormal breath sounds 04/17/2021   Chronic non-seasonal allergic rhinitis 04/16/2021   Metabolic acidosis, normal anion gap (NAG) 04/09/2021   Renal cyst, right 04/09/2021   Hyponatremia 04/09/2021   Normocytic anemia 04/08/2021   Generalized weakness 04/08/2021   Sepsis (HCC) 04/07/2021   Acute respiratory failure with hypoxia (HCC) 04/07/2021   Hyperkalemia 04/07/2021   AKI (acute kidney injury) (HCC) 04/06/2021   Pituitary mass (HCC) 10/28/2020   Intracranial mass 10/28/2020   Macrocytic anemia 10/28/2020   Chronic back pain 10/28/2020   Essential hypertension 10/28/2020   Syncope 10/27/2020   Diarrhea 12/02/2018   Diverticulosis 12/02/2018   Diverticulitis 06/30/2017   Rectal bleeding 06/30/2017   Leukocytosis 06/30/2017   CKD (chronic kidney disease) stage 3, GFR 30-59 ml/min (HCC) 06/30/2017   LLQ pain 12/23/2016   GERD (gastroesophageal reflux disease) 06/16/2016    Gastroesophageal reflux disease with esophagitis 06/16/2016   Precordial pain 03/10/2013   Impaired fasting glucose 03/09/2013   Mixed hyperlipidemia 03/09/2013   Hypothyroidism 03/09/2013    Past Surgical History:  Procedure Laterality Date   COLONOSCOPY     COLONOSCOPY N/A 12/12/2015   Dr. Jena Gauss: one 3 mm tubular adenoma in rectum s/p removal. Diverticulosis in entire colon. Internal hemorrhoids.    COLONOSCOPY WITH PROPOFOL N/A 08/21/2021   Procedure: COLONOSCOPY WITH PROPOFOL;  Surgeon: Corbin Ade, MD;  [facility];  Service: Endoscopy;  Laterality: N/A;  10:15am   ESOPHAGOGASTRODUODENOSCOPY N/A 03/19/2016   Dr. Jena Gauss: LA Grade A esophagitis, empiric dilation, normal stomach, normal duodenum   Left lobe thyroidectomy     MALONEY DILATION N/A 03/19/2016   Procedure: [name] DILATION;  Surgeon: Corbin Ade, MD;  [facility];  Service: Endoscopy;  Laterality: N/A;   POLYPECTOMY  12/12/2015   Procedure: POLYPECTOMY;  Surgeon: Corbin Ade, MD;  [facility];  Service: Endoscopy;;  colon   POLYPECTOMY  08/21/2021   Procedure: POLYPECTOMY;  Surgeon: Corbin Ade, MD;  [facility];  Service: Endoscopy;;  splenic flexure;   RECTOCELE REPAIR     TOTAL ABDOMINAL HYSTERECTOMY W/ BILATERAL SALPINGOOPHORECTOMY      OB History     Gravida  2   Para  2   Term  2   Preterm      AB      Living  2  SAB      IAB      Ectopic      Multiple      Live Births               Home Medications    Prior to Admission medications   Medication Sig Start Date End Date Taking? Authorizing Provider  benzonatate (TESSALON) 100 MG capsule Take 1 capsule (100 mg total) by mouth every 8 (eight) hours. 01/02/23  Yes Particia Nearing, PA-C  acetaminophen (TYLENOL) 500 MG tablet Take 1 tablet (500 mg total) by mouth every 6 (six) hours as needed. Patient not taking: Reported on 12/22/2022 10/29/20   Marguerita Merles Latif, DO   dicyclomine (BENTYL) 10 MG capsule Take 1 capsule (10 mg total) by mouth 2 (two) times daily as needed (loose stool). Hold if no bowel movement in 24 hours. 03/19/22   Tiffany Kocher, PA-C  escitalopram (LEXAPRO) 5 MG tablet Take 5 mg by mouth every other day. 10/01/22   [provider]  loratadine (CLARITIN) 10 MG tablet Take 10 mg by mouth daily.    [provider]  losartan (COZAAR) 100 MG tablet Take 1 tablet (100 mg total) by mouth daily. 04/24/21   Sharee Holster, NP  meloxicam (MOBIC) 7.5 MG tablet Take 7.5 mg by mouth daily.    [provider]  pantoprazole (PROTONIX) 40 MG tablet Take 40 mg by mouth daily.    [provider]  simvastatin (ZOCOR) 40 MG tablet Take 1 tablet (40 mg total) by mouth at bedtime. 04/24/21   Sharee Holster, NP    Family History Family History  Problem Relation Age of Onset   CAD Brother        2 brothers with CAD in their 12s   Crohn's disease Brother        had colon resection   Pancreatic cancer Brother    CAD Father    Heart failure Mother    Diabetes Mellitus II Mother    Colon cancer Neg Hx     Social History Social History   Tobacco Use   Smoking status: Never   Smokeless tobacco: Never  Vaping Use   Vaping status: Never Used  Substance Use Topics   Alcohol use: No   Drug use: No     Allergies   Bee venom   Review of Systems Review of Systems Per HPI  Physical Exam Triage Vital Signs ED Triage Vitals  Encounter Vitals Group     BP 01/02/23 1800 (!) 203/75     Systolic BP Percentile --      Diastolic BP Percentile --      Pulse Rate 01/02/23 1800 71     Resp 01/02/23 1800 20     Temp 01/02/23 1800 98 F (36.7 C)     Temp Source 01/02/23 1800 Oral     SpO2 01/02/23 1800 98 %     Weight --      Height --      Head Circumference --      Peak Flow --      Pain Score 01/02/23 1804 4     Pain Loc --      Pain Education --      Exclude from Growth Chart --    No data  found.  Updated Vital Signs BP (!) 199/73 (BP Location: Right Arm)   Pulse 71   Temp 98 F (36.7 C) (Oral)   Resp 20  SpO2 98%   Visual Acuity Right Eye Distance:   Left Eye Distance:   Bilateral Distance:    Right Eye Near:   Left Eye Near:    Bilateral Near:     Physical Exam Vitals and nursing note reviewed.  Constitutional:      Appearance: Normal appearance.  HENT:     Head: Atraumatic.     Right Ear: Tympanic membrane and external ear normal.     Left Ear: Tympanic membrane and external ear normal.     Nose: Rhinorrhea present.     Mouth/Throat:     Mouth: Mucous membranes are moist.     Pharynx: Oropharynx is clear.  Eyes:     Extraocular Movements: Extraocular movements intact.     Conjunctiva/sclera: Conjunctivae normal.  Cardiovascular:     Rate and Rhythm: Normal rate and regular rhythm.     Heart sounds: Normal heart sounds.  Pulmonary:     Effort: Pulmonary effort is normal.     Breath sounds: Normal breath sounds. No wheezing or rales.  Musculoskeletal:        General: Normal range of motion.     Cervical back: Normal range of motion and neck supple.  Skin:    General: Skin is warm and dry.  Neurological:     Mental Status: She is alert and oriented to person, place, and time.  Psychiatric:        Mood and Affect: Mood normal.        Thought Content: Thought content normal.      UC Treatments / Results  Labs (all labs ordered are listed, but only abnormal results are displayed) Labs Reviewed  POC COVID19/FLU A&B COMBO    EKG   Radiology No results found.  Procedures Procedures (including critical care time)  Medications Ordered in UC Medications - No data to display  Initial Impression / Assessment and Plan / UC Course  I have reviewed the triage vital signs and the nursing notes.  Pertinent labs & imaging results that were available during my care of the patient were reviewed by me and considered in my medical decision making  (see chart for details).     Hypertensive in triage, otherwise vital signs reassuring.  Suspect viral respiratory infection.  Rapid flu and COVID-negative, discussed Tessalon Perles, supportive over-the-counter medications, home care.  Return for worsening symptoms.  Final Clinical Impressions(s) / UC Diagnoses   Final diagnoses:  Viral URI     Discharge Instructions      You may take nasal sprays twice a day such as Flonase, Coricidin HBP, use saline sinus rinses, humidifiers, plain Mucinex.  I have sent in a cough medication as well.  Follow-up for significantly worsening symptoms.  Your COVID and flu testing was negative.    ED Prescriptions     Medication Sig Dispense Auth. Provider   benzonatate (TESSALON) 100 MG capsule Take 1 capsule (100 mg total) by mouth every 8 (eight) hours. 21 capsule Particia Nearing, [place]      PDMP not reviewed this encounter.   Particia Nearing, [place] 01/02/23 1910

## 2023-01-02 NOTE — Discharge Instructions (Signed)
You may take nasal sprays twice a day such as Flonase, Coricidin HBP, use saline sinus rinses, humidifiers, plain Mucinex.  I have sent in a cough medication as well.  Follow-up for significantly worsening symptoms.  Your COVID and flu testing was negative.

## 2023-01-02 NOTE — ED Triage Notes (Signed)
Scratchy throat, runny nose, since yesterday.

## 2023-01-06 NOTE — Discharge Instructions (Signed)

## 2023-01-08 ENCOUNTER — Encounter
Admission: RE | Disposition: A | Discharge status: Home / Self Care | Payer: Self-pay | Source: Home / Self Care | Attending: Ophthalmology

## 2023-01-08 ENCOUNTER — Encounter: Payer: Self-pay | Admitting: Ophthalmology

## 2023-01-08 ENCOUNTER — Other Ambulatory Visit: Payer: Self-pay

## 2023-01-08 ENCOUNTER — Ambulatory Visit: Payer: Medicare Other | Admitting: Anesthesiology

## 2023-01-08 ENCOUNTER — Ambulatory Visit
Admission: RE | Admit: 2023-01-08 | Discharge: 2023-01-08 | Disposition: A | Discharge status: Home / Self Care | Payer: Medicare Other | Attending: Ophthalmology | Admitting: Ophthalmology

## 2023-01-08 DIAGNOSIS — K219 Gastro-esophageal reflux disease without esophagitis: Secondary | ICD-10-CM | POA: Insufficient documentation

## 2023-01-08 DIAGNOSIS — E119 Type 2 diabetes mellitus without complications: Secondary | ICD-10-CM | POA: Diagnosis not present

## 2023-01-08 DIAGNOSIS — N289 Disorder of kidney and ureter, unspecified: Secondary | ICD-10-CM | POA: Insufficient documentation

## 2023-01-08 DIAGNOSIS — I10 Essential (primary) hypertension: Secondary | ICD-10-CM | POA: Insufficient documentation

## 2023-01-08 DIAGNOSIS — H2511 Age-related nuclear cataract, right eye: Secondary | ICD-10-CM | POA: Insufficient documentation

## 2023-01-08 HISTORY — PX: CATARACT EXTRACTION W/PHACO: SHX586

## 2023-01-08 SURGERY — PHACOEMULSIFICATION, CATARACT, WITH IOL INSERTION
Anesthesia: Monitor Anesthesia Care | Site: Eye | Laterality: Right

## 2023-01-08 MED ORDER — FENTANYL CITRATE PF 50 MCG/ML IJ SOSY: 25.0000 ug | PREFILLED_SYRINGE | INTRAMUSCULAR | Status: DC | PRN

## 2023-01-08 MED ORDER — SODIUM CHLORIDE 0.9% FLUSH: 10.0000 mL | Freq: Two times a day (BID) | INTRAVENOUS | Status: DC

## 2023-01-08 MED ORDER — CEFUROXIME OPHTHALMIC INJECTION 1 MG/0.1 ML
INJECTION | OPHTHALMIC | Status: DC | PRN
  Administered 2023-01-08: .1 mL via INTRACAMERAL

## 2023-01-08 MED ORDER — SIGHTPATH DOSE#1 BSS IO SOLN
INTRAOCULAR | Status: DC | PRN
  Administered 2023-01-08: 68 mL via OPHTHALMIC

## 2023-01-08 MED ORDER — MIDAZOLAM HCL 2 MG/2ML IJ SOLN
INTRAMUSCULAR | Status: AC
  Filled 2023-01-08: qty 2

## 2023-01-08 MED ORDER — OXYCODONE HCL 5 MG PO TABS: 5.0000 mg | ORAL_TABLET | Freq: Once | ORAL | Status: DC | PRN

## 2023-01-08 MED ORDER — DROPERIDOL 2.5 MG/ML IJ SOLN: 0.6250 mg | Freq: Once | INTRAMUSCULAR | Status: DC | PRN

## 2023-01-08 MED ORDER — LABETALOL HCL 5 MG/ML IV SOLN
INTRAVENOUS | Status: AC
  Filled 2023-01-08: qty 4

## 2023-01-08 MED ORDER — ACETAMINOPHEN 10 MG/ML IV SOLN: 1000.0000 mg | Freq: Once | INTRAVENOUS | Status: DC | PRN

## 2023-01-08 MED ORDER — FENTANYL CITRATE (PF) 100 MCG/2ML IJ SOLN
INTRAMUSCULAR | Status: AC
  Filled 2023-01-08: qty 2

## 2023-01-08 MED ORDER — TETRACAINE HCL 0.5 % OP SOLN
OPHTHALMIC | Status: AC
  Filled 2023-01-08: qty 4

## 2023-01-08 MED ORDER — ARMC OPHTHALMIC DILATING DROPS
1.0000 | OPHTHALMIC | Status: DC | PRN
  Administered 2023-01-08 (×3): 1 via OPHTHALMIC

## 2023-01-08 MED ORDER — TETRACAINE HCL 0.5 % OP SOLN
1.0000 [drp] | OPHTHALMIC | Status: DC | PRN
  Administered 2023-01-08 (×3): 1 [drp] via OPHTHALMIC

## 2023-01-08 MED ORDER — OXYCODONE HCL 5 MG/5ML PO SOLN: 5.0000 mg | Freq: Once | ORAL | Status: DC | PRN

## 2023-01-08 MED ORDER — SIGHTPATH DOSE#1 BSS IO SOLN
INTRAOCULAR | Status: DC | PRN
  Administered 2023-01-08: 1 mL via INTRAMUSCULAR

## 2023-01-08 MED ORDER — MIDAZOLAM HCL 2 MG/2ML IJ SOLN
INTRAMUSCULAR | Status: DC | PRN
  Administered 2023-01-08: .5 mg via INTRAVENOUS

## 2023-01-08 MED ORDER — BRIMONIDINE TARTRATE-TIMOLOL 0.2-0.5 % OP SOLN
OPHTHALMIC | Status: DC | PRN
  Administered 2023-01-08: 1 [drp] via OPHTHALMIC

## 2023-01-08 MED ORDER — SIGHTPATH DOSE#1 BSS IO SOLN
INTRAOCULAR | Status: DC | PRN
  Administered 2023-01-08: 15 mL

## 2023-01-08 MED ORDER — FENTANYL CITRATE (PF) 100 MCG/2ML IJ SOLN
INTRAMUSCULAR | Status: DC | PRN
  Administered 2023-01-08: 25 ug via INTRAVENOUS

## 2023-01-08 MED ORDER — LABETALOL HCL 5 MG/ML IV SOLN
5.0000 mg | Freq: Once | INTRAVENOUS | Status: AC
  Administered 2023-01-08: 5 mg via INTRAVENOUS

## 2023-01-08 MED ORDER — SIGHTPATH DOSE#1 NA HYALUR & NA CHOND-NA HYALUR IO KIT
PACK | INTRAOCULAR | Status: DC | PRN
  Administered 2023-01-08: 1 via OPHTHALMIC

## 2023-01-08 SURGICAL SUPPLY — 9 items
CATARACT SUITE SIGHTPATH (MISCELLANEOUS) ×1
FEE CATARACT SUITE SIGHTPATH (MISCELLANEOUS) ×1 IMPLANT
GLOVE SRG 8 PF TXTR STRL LF DI (GLOVE) ×1 IMPLANT
GLOVE SURG ENC TEXT LTX SZ7.5 (GLOVE) ×1 IMPLANT
LENS IOL TECNIS EYHANCE 22.0 (Intraocular Lens) IMPLANT
NDL FILTER BLUNT 18X1 1/2 (NEEDLE) ×1 IMPLANT
NEEDLE FILTER BLUNT 18X1 1/2 (NEEDLE) ×1
SYR 3ML LL SCALE MARK (SYRINGE) ×1 IMPLANT
TIP IRRIGATON/ASPIRATION (MISCELLANEOUS) IMPLANT

## 2023-01-08 NOTE — H&P (Signed)
Diane Torres   Primary Care Physician:  Benita Stabile, MD Ophthalmologist: Dr. Lockie Mola  Pre-Procedure History & Physical: HPI:  Diane Torres is a 82 y.o. female here for ophthalmic surgery.   Past Medical History:  Diagnosis Date   CAP (community acquired pneumonia) 05/2018   Diverticulitis    CT verified in 2015, 2018, 2019, 2020, 2023   GERD (gastroesophageal reflux disease)    Hypertension    Hypothyroidism    Impaired fasting glucose    Mixed hyperlipidemia     Past Surgical History:  Procedure Laterality Date   COLONOSCOPY     COLONOSCOPY N/A 12/12/2015   Dr. Jena Gauss: one 3 mm tubular adenoma in rectum s/p removal. Diverticulosis in entire colon. Internal hemorrhoids.    COLONOSCOPY WITH PROPOFOL N/A 08/21/2021   Procedure: COLONOSCOPY WITH PROPOFOL;  Surgeon: Corbin Ade, MD;  [facility];  Service: Endoscopy;  Laterality: N/A;  10:15am   ESOPHAGOGASTRODUODENOSCOPY N/A 03/19/2016   Dr. Jena Gauss: LA Grade A esophagitis, empiric dilation, normal stomach, normal duodenum   Left lobe thyroidectomy     MALONEY DILATION N/A 03/19/2016   Procedure: [name] DILATION;  Surgeon: Corbin Ade, MD;  [facility];  Service: Endoscopy;  Laterality: N/A;   POLYPECTOMY  12/12/2015   Procedure: POLYPECTOMY;  Surgeon: Corbin Ade, MD;  [facility];  Service: Endoscopy;;  colon   POLYPECTOMY  08/21/2021   Procedure: POLYPECTOMY;  Surgeon: Corbin Ade, MD;  [facility];  Service: Endoscopy;;  splenic flexure;   RECTOCELE REPAIR     TOTAL ABDOMINAL HYSTERECTOMY W/ BILATERAL SALPINGOOPHORECTOMY      Prior to Admission medications   Medication Sig Start Date End Date Taking? Authorizing Provider  benzonatate (TESSALON) 100 MG capsule Take 1 capsule (100 mg total) by mouth every 8 (eight) hours. 01/02/23  Yes Particia Nearing, PA-C  dicyclomine (BENTYL) 10 MG capsule Take 1 capsule (10 mg total) by mouth 2 (two) times  daily as needed (loose stool). Hold if no bowel movement in 24 hours. 03/19/22  Yes Tiffany Kocher, PA-C  escitalopram (LEXAPRO) 5 MG tablet Take 5 mg by mouth every other day. 10/01/22  Yes [provider]  loratadine (CLARITIN) 10 MG tablet Take 10 mg by mouth daily.   Yes [provider]  losartan (COZAAR) 100 MG tablet Take 1 tablet (100 mg total) by mouth daily. 04/24/21  Yes Sharee Holster, NP  meloxicam (MOBIC) 7.5 MG tablet Take 7.5 mg by mouth daily.   Yes [provider]  pantoprazole (PROTONIX) 40 MG tablet Take 40 mg by mouth daily.   Yes [provider]  simvastatin (ZOCOR) 40 MG tablet Take 1 tablet (40 mg total) by mouth at bedtime. 04/24/21  Yes Sharee Holster, NP  acetaminophen (TYLENOL) 500 MG tablet Take 1 tablet (500 mg total) by mouth every 6 (six) hours as needed. Patient not taking: Reported on 12/22/2022 10/29/20   Marguerita Merles Latif, DO    Allergies as of 12/17/2022 - Review Complete 10/08/2022  Allergen Reaction Noted   Bee venom Anaphylaxis 03/18/2016    Family History  Problem Relation Age of Onset   CAD Brother        2 brothers with CAD in their 28s   Crohn's disease Brother        had colon resection   Pancreatic cancer Brother    CAD Father    Heart failure Mother  Diabetes Mellitus II Mother    Colon cancer Neg Hx     Social History   Socioeconomic History   Marital status: Widowed    Spouse name: Not on file   Number of children: Not on file   Years of education: Not on file   Highest education level: Not on file  Occupational History   Not on file  Tobacco Use   Smoking status: Never   Smokeless tobacco: Never  Vaping Use   Vaping status: Never Used  Substance and Sexual Activity   Alcohol use: No   Drug use: No   Sexual activity: Yes  Other Topics Concern   Not on file  Social History Narrative   Not on file   Social Determinants of Health   Financial Resource Strain: Not on file  Food  Insecurity: Not on file  Transportation Needs: Not on file  Physical Activity: Not on file  Stress: Not on file  Social Connections: Not on file  Intimate Partner Violence: Not on file    Review of Systems: See HPI, otherwise negative ROS  Physical Exam: BP (!) 169/76   Pulse 75   Temp (!) 97.1 F (36.2 C)   Ht 4' 11.49" (1.511 m)   Wt 65 kg   SpO2 95%   BMI 28.47 kg/m  General:   Alert,  pleasant and cooperative in NAD Head:  Normocephalic and atraumatic. Lungs:  Clear to auscultation.    Heart:  Regular rate and rhythm.   Impression/Plan: Diane Torres is here for ophthalmic surgery.  Risks, benefits, limitations, and alternatives regarding ophthalmic surgery have been reviewed with the patient.  Questions have been answered.  All parties agreeable.   Lockie Mola, MD  01/08/2023, 7:31 AM

## 2023-01-08 NOTE — Anesthesia Postprocedure Evaluation (Signed)
Anesthesia Post Note  Patient: LEIGHTON MEXICANO  Procedure(s) Performed: CATARACT EXTRACTION PHACO AND INTRAOCULAR LENS PLACEMENT (IOC) RIGHT  10.65  00:58.3 (Right: Eye)  Patient location during evaluation: PACU Anesthesia Type: MAC Level of consciousness: awake and alert Pain management: pain level controlled Vital Signs Assessment: post-procedure vital signs reviewed and stable Respiratory status: spontaneous breathing, nonlabored ventilation, respiratory function stable and patient connected to nasal cannula oxygen Cardiovascular status: stable and blood pressure returned to baseline Postop Assessment: no apparent nausea or vomiting Anesthetic complications: no   No notable events documented.   Last Vitals:  Vitals:   01/08/23 0756 01/08/23 0800  BP: (!) 182/69 (!) 174/59  Pulse: 66 60  Resp: 12 13  Temp: (!) 36.1 C (!) 36.1 C  SpO2: 96% 95%    Last Pain:  Vitals:   01/08/23 0800  PainSc: 0-No pain                 Yevette Edwards

## 2023-01-08 NOTE — Op Note (Signed)
LOCATION:  [facility]   PREOPERATIVE DIAGNOSIS:    Nuclear sclerotic cataract right eye. H25.11   POSTOPERATIVE DIAGNOSIS:  Nuclear sclerotic cataract right eye.     PROCEDURE:  Phacoemusification with posterior chamber intraocular lens placement of the right eye   ULTRASOUND TIME: Procedure(s): CATARACT EXTRACTION PHACO AND INTRAOCULAR LENS PLACEMENT (IOC) RIGHT  10.65  00:58.3 (Right)  LENS:   Implant Name Type Inv. Item Serial No. Manufacturer Lot No. LRB No. Used Action  LENS IOL TECNIS EYHANCE 22.0 - [id number] Intraocular Lens LENS IOL TECNIS EYHANCE 22.0 [id number] [employer]  Right 1 Implanted         SURGEON:  Deirdre Evener, MD   ANESTHESIA:  Topical with tetracaine drops and 2% Xylocaine jelly, augmented with 1% preservative-free intracameral lidocaine.    COMPLICATIONS:  None.   DESCRIPTION OF PROCEDURE:  The patient was identified in the holding room and transported to the operating room and placed in the supine position under the operating microscope.  The right eye was identified as the operative eye and it was prepped and draped in the usual sterile ophthalmic fashion.   A 1 millimeter clear-corneal paracentesis was made at the 12:00 position.  0.5 ml of preservative-free 1% lidocaine was injected into the anterior chamber. The anterior chamber was filled with Viscoat viscoelastic.  A 2.4 millimeter keratome was used to make a near-clear corneal incision at the 9:00 position.  A curvilinear capsulorrhexis was made with a cystotome and capsulorrhexis forceps.  Balanced salt solution was used to hydrodissect and hydrodelineate the nucleus.   Phacoemulsification was then used in stop and chop fashion to remove the lens nucleus and epinucleus.  The remaining cortex was then removed using the irrigation and aspiration handpiece. Provisc was then placed into the capsular bag to distend it for lens placement.  A lens was then injected into the capsular bag.   The remaining viscoelastic was aspirated.   Wounds were hydrated with balanced salt solution.  The anterior chamber was inflated to a physiologic pressure with balanced salt solution.  No wound leaks were noted. Cefuroxime 0.1 ml of a 10mg /ml solution was injected into the anterior chamber for a dose of 1 mg of intracameral antibiotic at the completion of the case.   Timolol and Brimonidine drops were applied to the eye.  The patient was taken to the recovery room in stable condition without complications of anesthesia or surgery.   Geoge Lawrance 01/08/2023, 7:55 AM

## 2023-01-08 NOTE — Transfer of Care (Signed)
Immediate Anesthesia Transfer of Care Note  Patient: Diane Torres  Procedure(s) Performed: CATARACT EXTRACTION PHACO AND INTRAOCULAR LENS PLACEMENT (IOC) RIGHT  10.65  00:58.3 (Right: Eye)  Patient Location: PACU  Anesthesia Type:MAC  Level of Consciousness: awake, alert , and oriented  Airway & Oxygen Therapy: Patient Spontanous Breathing  Post-op Assessment: Report given to RN, Post -op Vital signs reviewed and stable, and Patient moving all extremities X 4  Post vital signs: Reviewed and stable  Last Vitals:  Vitals Value Taken Time  BP    Temp    Pulse 63 01/08/23 0756  Resp    SpO2 96 % 01/08/23 0756  Vitals shown include unfiled device data.  Last Pain:  Vitals:   01/08/23 0642  PainSc: 0-No pain         Complications: No notable events documented.

## 2023-01-08 NOTE — Anesthesia Preprocedure Evaluation (Signed)
Anesthesia Evaluation  Patient identified by MRN, date of birth, ID band Patient awake    Reviewed: Allergy & Precautions, H&P , NPO status , Patient's Chart, lab work & pertinent test results, reviewed documented beta blocker date and time   Airway Mallampati: II  TM Distance: >3 FB Neck ROM: full    Dental no notable dental hx. (+) Teeth Intact   Pulmonary pneumonia   Pulmonary exam normal breath sounds clear to auscultation       Cardiovascular Exercise Tolerance: Good hypertension, On Medications negative cardio ROS  Rhythm:regular Rate:Normal     Neuro/Psych negative neurological ROS  negative psych ROS   GI/Hepatic Neg liver ROS,GERD  Medicated,,  Endo/Other  diabetesHypothyroidism    Renal/GU Renal disease     Musculoskeletal   Abdominal   Peds  Hematology  (+) Blood dyscrasia, anemia   Anesthesia Other Findings   Reproductive/Obstetrics negative OB ROS                             Anesthesia Physical Anesthesia Plan  ASA: 3  Anesthesia Plan: MAC   Post-op Pain Management:    Induction:   PONV Risk Score and Plan:   Airway Management Planned:   Additional Equipment:   Intra-op Plan:   Post-operative Plan:   Informed Consent: I have reviewed the patients History and Physical, chart, labs and discussed the procedure including the risks, benefits and alternatives for the proposed anesthesia with the patient or authorized representative who has indicated his/her understanding and acceptance.       Plan Discussed with: CRNA  Anesthesia Plan Comments:        Anesthesia Quick Evaluation

## 2023-01-12 ENCOUNTER — Encounter: Payer: Self-pay | Admitting: Ophthalmology

## 2023-01-13 NOTE — Anesthesia Preprocedure Evaluation (Addendum)
Anesthesia Evaluation  Patient identified by MRN, date of birth, ID band Patient awake    Reviewed: Allergy & Precautions, H&P , NPO status , Patient's Chart, lab work & pertinent test results  Airway Mallampati: III  TM Distance: <3 FB Neck ROM: Full    Dental no notable dental hx.    Pulmonary neg pulmonary ROS, pneumonia   Pulmonary exam normal breath sounds clear to auscultation       Cardiovascular hypertension, Normal cardiovascular exam Rhythm:Regular Rate:Normal     Neuro/Psych negative neurological ROS  negative psych ROS   GI/Hepatic negative GI ROS, Neg liver ROS,GERD  ,,  Endo/Other  negative endocrine ROSHypothyroidism    Renal/GU Renal diseasenegative Renal ROS  negative genitourinary   Musculoskeletal negative musculoskeletal ROS (+)    Abdominal   Peds negative pediatric ROS (+)  Hematology negative hematology ROS (+) Blood dyscrasia, anemia   Anesthesia Other Findings Previous cataract surgery 01-08-23 Dr. Pernell Dupre and Hoover Browns CRNA, 25 mcg fentanyl IV and 0.5 mg Versed IV   Mixed hyperlipidemia Impaired fasting glucose  Hypothyroidism  GERD (gastroesophageal reflux disease) Diverticulitis  CAP (community acquired pneumonia) Hypertension     Reproductive/Obstetrics negative OB ROS                             Anesthesia Physical Anesthesia Plan  ASA: 3  Anesthesia Plan: MAC   Post-op Pain Management:    Induction: Intravenous  PONV Risk Score and Plan:   Airway Management Planned: Natural Airway and Nasal Cannula  Additional Equipment:   Intra-op Plan:   Post-operative Plan:   Informed Consent: I have reviewed the patients History and Physical, chart, labs and discussed the procedure including the risks, benefits and alternatives for the proposed anesthesia with the patient or authorized representative who has indicated his/her understanding and  acceptance.     Dental Advisory Given  Plan Discussed with: Anesthesiologist, CRNA and Surgeon  Anesthesia Plan Comments: (Patient consented for risks of anesthesia including but not limited to:  - adverse reactions to medications - damage to eyes, teeth, lips or other oral mucosa - nerve damage due to positioning  - sore throat or hoarseness - Damage to heart, brain, nerves, lungs, other parts of body or loss of life  Patient voiced understanding and assent.)        Anesthesia Quick Evaluation

## 2023-01-19 NOTE — Discharge Instructions (Signed)

## 2023-01-21 ENCOUNTER — Encounter: Payer: Self-pay | Admitting: Ophthalmology

## 2023-01-21 ENCOUNTER — Encounter
Admission: RE | Disposition: A | Discharge status: Home / Self Care | Payer: Self-pay | Source: Home / Self Care | Attending: Ophthalmology

## 2023-01-21 ENCOUNTER — Ambulatory Visit: Payer: Medicare Other | Admitting: Anesthesiology

## 2023-01-21 ENCOUNTER — Other Ambulatory Visit: Payer: Self-pay

## 2023-01-21 ENCOUNTER — Ambulatory Visit
Admission: RE | Admit: 2023-01-21 | Discharge: 2023-01-21 | Disposition: A | Discharge status: Home / Self Care | Payer: Medicare Other | Attending: Ophthalmology | Admitting: Ophthalmology

## 2023-01-21 DIAGNOSIS — I10 Essential (primary) hypertension: Secondary | ICD-10-CM | POA: Insufficient documentation

## 2023-01-21 DIAGNOSIS — H2512 Age-related nuclear cataract, left eye: Secondary | ICD-10-CM | POA: Insufficient documentation

## 2023-01-21 DIAGNOSIS — K219 Gastro-esophageal reflux disease without esophagitis: Secondary | ICD-10-CM | POA: Insufficient documentation

## 2023-01-21 HISTORY — PX: CATARACT EXTRACTION W/PHACO: SHX586

## 2023-01-21 SURGERY — PHACOEMULSIFICATION, CATARACT, WITH IOL INSERTION
Anesthesia: Monitor Anesthesia Care | Laterality: Left

## 2023-01-21 MED ORDER — TETRACAINE HCL 0.5 % OP SOLN
1.0000 [drp] | OPHTHALMIC | Status: DC | PRN
  Administered 2023-01-21 (×3): 1 [drp] via OPHTHALMIC

## 2023-01-21 MED ORDER — SIGHTPATH DOSE#1 BSS IO SOLN
INTRAOCULAR | Status: DC | PRN
  Administered 2023-01-21: 2 mL

## 2023-01-21 MED ORDER — SIGHTPATH DOSE#1 NA HYALUR & NA CHOND-NA HYALUR IO KIT
PACK | INTRAOCULAR | Status: DC | PRN
  Administered 2023-01-21: 1 via OPHTHALMIC

## 2023-01-21 MED ORDER — SIGHTPATH DOSE#1 BSS IO SOLN
INTRAOCULAR | Status: DC | PRN
  Administered 2023-01-21: 15 mL via INTRAOCULAR

## 2023-01-21 MED ORDER — MIDAZOLAM HCL 2 MG/2ML IJ SOLN
INTRAMUSCULAR | Status: DC | PRN
  Administered 2023-01-21: 2 mg via INTRAVENOUS

## 2023-01-21 MED ORDER — SIGHTPATH DOSE#1 BSS IO SOLN
INTRAOCULAR | Status: DC | PRN
  Administered 2023-01-21: 70 mL via OPHTHALMIC

## 2023-01-21 MED ORDER — TETRACAINE HCL 0.5 % OP SOLN
OPHTHALMIC | Status: AC
  Filled 2023-01-21: qty 4

## 2023-01-21 MED ORDER — ARMC OPHTHALMIC DILATING DROPS
1.0000 | OPHTHALMIC | Status: DC | PRN
  Administered 2023-01-21 (×3): 1 via OPHTHALMIC

## 2023-01-21 MED ORDER — CEFUROXIME OPHTHALMIC INJECTION 1 MG/0.1 ML
INJECTION | OPHTHALMIC | Status: DC | PRN
  Administered 2023-01-21: 1 mg via INTRACAMERAL

## 2023-01-21 MED ORDER — ARMC OPHTHALMIC DILATING DROPS
OPHTHALMIC | Status: AC
  Filled 2023-01-21: qty 0.5

## 2023-01-21 MED ORDER — BRIMONIDINE TARTRATE-TIMOLOL 0.2-0.5 % OP SOLN
OPHTHALMIC | Status: DC | PRN
  Administered 2023-01-21: 1 [drp] via OPHTHALMIC

## 2023-01-21 MED ORDER — MIDAZOLAM HCL 2 MG/2ML IJ SOLN
INTRAMUSCULAR | Status: AC
  Filled 2023-01-21: qty 2

## 2023-01-21 SURGICAL SUPPLY — 8 items
CATARACT SUITE SIGHTPATH (MISCELLANEOUS) ×1
FEE CATARACT SUITE SIGHTPATH (MISCELLANEOUS) ×1 IMPLANT
GLOVE SRG 8 PF TXTR STRL LF DI (GLOVE) ×1 IMPLANT
GLOVE SURG ENC TEXT LTX SZ7.5 (GLOVE) ×1 IMPLANT
LENS IOL TECNIS EYHANCE 22.0 (Intraocular Lens) IMPLANT
NDL FILTER BLUNT 18X1 1/2 (NEEDLE) ×1 IMPLANT
NEEDLE FILTER BLUNT 18X1 1/2 (NEEDLE) ×1
SYR 3ML LL SCALE MARK (SYRINGE) ×1 IMPLANT

## 2023-01-21 NOTE — Op Note (Signed)
OPERATIVE NOTE  CIPRIANA SPIVEY [id number] 01/21/2023   PREOPERATIVE DIAGNOSIS:  Nuclear sclerotic cataract left eye. H25.12   POSTOPERATIVE DIAGNOSIS:    Nuclear sclerotic cataract left eye.     PROCEDURE:  Phacoemusification with posterior chamber intraocular lens placement of the left eye  Ultrasound time: Procedure(s): CATARACT EXTRACTION PHACO AND INTRAOCULAR LENS PLACEMENT (IOC) LEFT 5.73 00:52.6 (Left)  LENS:   Implant Name Type Inv. Item Serial No. Manufacturer Lot No. LRB No. Used Action  LENS IOL TECNIS EYHANCE 22.0 - [id number] Intraocular Lens LENS IOL TECNIS EYHANCE 22.0 [id number] [employer]  Left 1 Implanted      SURGEON:  Deirdre Evener, MD   ANESTHESIA:  Topical with tetracaine drops and 2% Xylocaine jelly, augmented with 1% preservative-free intracameral lidocaine.    COMPLICATIONS:  None.   DESCRIPTION OF PROCEDURE:  The patient was identified in the holding room and transported to the operating room and placed in the supine position under the operating microscope.  The left eye was identified as the operative eye and it was prepped and draped in the usual sterile ophthalmic fashion.   A 1 millimeter clear-corneal paracentesis was made at the 1:30 position.  0.5 ml of preservative-free 1% lidocaine was injected into the anterior chamber.  The anterior chamber was filled with Viscoat viscoelastic.  A 2.4 millimeter keratome was used to make a near-clear corneal incision at the 10:30 position.  .  A curvilinear capsulorrhexis was made with a cystotome and capsulorrhexis forceps.  Balanced salt solution was used to hydrodissect and hydrodelineate the nucleus.   Phacoemulsification was then used in stop and chop fashion to remove the lens nucleus and epinucleus.  The remaining cortex was then removed using the irrigation and aspiration handpiece. Provisc was then placed into the capsular bag to distend it for lens placement.  A lens was then injected into the  capsular bag.  The remaining viscoelastic was aspirated.   Wounds were hydrated with balanced salt solution.  The anterior chamber was inflated to a physiologic pressure with balanced salt solution.  No wound leaks were noted. Cefuroxime 0.1 ml of a 10mg /ml solution was injected into the anterior chamber for a dose of 1 mg of intracameral antibiotic at the completion of the case.   Timolol and Brimonidine drops were applied to the eye.  The patient was taken to the recovery room in stable condition without complications of anesthesia or surgery.  Mailey Landstrom 01/21/2023, 12:29 PM

## 2023-01-21 NOTE — Transfer of Care (Signed)
Immediate Anesthesia Transfer of Care Note  Patient: Diane Torres  Procedure(s) Performed: CATARACT EXTRACTION PHACO AND INTRAOCULAR LENS PLACEMENT (IOC) LEFT 5.73 00:52.6 (Left)  Patient Location: PACU  Anesthesia Type: MAC  Level of Consciousness: awake, alert  and patient cooperative  Airway and Oxygen Therapy: Patient Spontanous Breathing and Patient connected to supplemental oxygen  Post-op Assessment: Post-op Vital signs reviewed, Patient's Cardiovascular Status Stable, Respiratory Function Stable, Patent Airway and No signs of Nausea or vomiting  Post-op Vital Signs: Reviewed and stable  Complications: No notable events documented.

## 2023-01-21 NOTE — Anesthesia Postprocedure Evaluation (Signed)
Anesthesia Post Note  Patient: Diane Torres  Procedure(s) Performed: CATARACT EXTRACTION PHACO AND INTRAOCULAR LENS PLACEMENT (IOC) LEFT 5.73 00:52.6 (Left)  Patient location during evaluation: PACU Anesthesia Type: MAC Level of consciousness: awake and alert Pain management: pain level controlled Vital Signs Assessment: post-procedure vital signs reviewed and stable Respiratory status: spontaneous breathing, nonlabored ventilation, respiratory function stable and patient connected to nasal cannula oxygen Cardiovascular status: stable and blood pressure returned to baseline Postop Assessment: no apparent nausea or vomiting Anesthetic complications: no   No notable events documented.   Last Vitals:  Vitals:   01/21/23 1230 01/21/23 1235  BP: (!) 178/62 (!) 167/66  Pulse: 67 69  Resp: 14 19  Temp: (!) 36.1 C (!) 36.1 C  SpO2: 99% 95%    Last Pain:  Vitals:   01/21/23 1235  TempSrc:   PainSc: 0-No pain                 Lakela Kuba C Jaclyn Carew

## 2023-01-21 NOTE — H&P (Signed)
[facility]   Primary Care Physician:  Benita Stabile, MD Ophthalmologist: Dr. Lockie Mola  Pre-Procedure History & Physical: HPI:  Diane Torres is a 82 y.o. female here for ophthalmic surgery.   Past Medical History:  Diagnosis Date   CAP (community acquired pneumonia) 05/2018   Diverticulitis    CT verified in 2015, 2018, 2019, 2020, 2023   GERD (gastroesophageal reflux disease)    Hypertension    Hypothyroidism    Impaired fasting glucose    Mixed hyperlipidemia     Past Surgical History:  Procedure Laterality Date   CATARACT EXTRACTION W/PHACO Right 01/08/2023   Procedure: CATARACT EXTRACTION PHACO AND INTRAOCULAR LENS PLACEMENT (IOC) RIGHT  10.65  00:58.3;  Surgeon: Lockie Mola, MD;  Location: [facility];  Service: Ophthalmology;  Laterality: Right;   COLONOSCOPY     COLONOSCOPY N/A 12/12/2015   Dr. Jena Gauss: one 3 mm tubular adenoma in rectum s/p removal. Diverticulosis in entire colon. Internal hemorrhoids.    COLONOSCOPY WITH PROPOFOL N/A 08/21/2021   Procedure: COLONOSCOPY WITH PROPOFOL;  Surgeon: Corbin Ade, MD;  Location: [facility];  Service: Endoscopy;  Laterality: N/A;  10:15am   ESOPHAGOGASTRODUODENOSCOPY N/A 03/19/2016   Dr. Jena Gauss: LA Grade A esophagitis, empiric dilation, normal stomach, normal duodenum   Left lobe thyroidectomy     MALONEY DILATION N/A 03/19/2016   Procedure: [name] DILATION;  Surgeon: Corbin Ade, MD;  Location: [facility];  Service: Endoscopy;  Laterality: N/A;   POLYPECTOMY  12/12/2015   Procedure: POLYPECTOMY;  Surgeon: Corbin Ade, MD;  Location: [facility];  Service: Endoscopy;;  colon   POLYPECTOMY  08/21/2021   Procedure: POLYPECTOMY;  Surgeon: Corbin Ade, MD;  Location: [facility];  Service: Endoscopy;;  splenic flexure;   RECTOCELE REPAIR     TOTAL ABDOMINAL HYSTERECTOMY W/ BILATERAL SALPINGOOPHORECTOMY      Prior to Admission medications   Medication Sig Start Date End Date  Taking? Authorizing Provider  acetaminophen (TYLENOL) 500 MG tablet Take 1 tablet (500 mg total) by mouth every 6 (six) hours as needed. 10/29/20  Yes Sheikh, Omair Latif, DO  benzonatate (TESSALON) 100 MG capsule Take 1 capsule (100 mg total) by mouth every 8 (eight) hours. 01/02/23  Yes Particia Nearing, PA-C  dicyclomine (BENTYL) 10 MG capsule Take 1 capsule (10 mg total) by mouth 2 (two) times daily as needed (loose stool). Hold if no bowel movement in 24 hours. 03/19/22  Yes Tiffany Kocher, PA-C  escitalopram (LEXAPRO) 5 MG tablet Take 5 mg by mouth every other day. 10/01/22  Yes [provider]  loratadine (CLARITIN) 10 MG tablet Take 10 mg by mouth daily.   Yes [provider]  losartan (COZAAR) 100 MG tablet Take 1 tablet (100 mg total) by mouth daily. 04/24/21  Yes Sharee Holster, NP  meloxicam (MOBIC) 7.5 MG tablet Take 7.5 mg by mouth daily.   Yes [provider]  pantoprazole (PROTONIX) 40 MG tablet Take 40 mg by mouth daily.   Yes [provider]  simvastatin (ZOCOR) 40 MG tablet Take 1 tablet (40 mg total) by mouth at bedtime. 04/24/21  Yes Sharee Holster, NP    Allergies as of 12/17/2022 - Review Complete 10/08/2022  Allergen Reaction Noted   Bee venom Anaphylaxis 03/18/2016    Family History  Problem Relation Age of Onset   CAD Brother        2 brothers with CAD in their 103s  Crohn's disease Brother        had colon resection   Pancreatic cancer Brother    CAD Father    Heart failure Mother    Diabetes Mellitus II Mother    Colon cancer Neg Hx     Social History   Socioeconomic History   Marital status: Widowed    Spouse name: Not on file   Number of children: Not on file   Years of education: Not on file   Highest education level: Not on file  Occupational History   Not on file  Tobacco Use   Smoking status: Never   Smokeless tobacco: Never  Vaping Use   Vaping status: Never Used  Substance and Sexual Activity    Alcohol use: No   Drug use: No   Sexual activity: Yes  Other Topics Concern   Not on file  Social History Narrative   Not on file   Social Drivers of Health   Financial Resource Strain: Not on file  Food Insecurity: Not on file  Transportation Needs: Not on file  Physical Activity: Not on file  Stress: Not on file  Social Connections: Not on file  Intimate Partner Violence: Not on file    Review of Systems: See HPI, otherwise negative ROS  Physical Exam: BP (!) 171/71   Pulse 75   Temp (!) 97.4 F (36.3 C) (Temporal)   Resp 17   Ht 4' 11.49" (1.511 m)   Wt 64.9 kg   SpO2 96%   BMI 28.41 kg/m  General:   Alert,  pleasant and cooperative in NAD Head:  Normocephalic and atraumatic. Lungs:  Clear to auscultation.    Heart:  Regular rate and rhythm.   Impression/Plan: Diane Torres is here for ophthalmic surgery.  Risks, benefits, limitations, and alternatives regarding ophthalmic surgery have been reviewed with the patient.  Questions have been answered.  All parties agreeable.   Lockie Mola, MD  01/21/2023, 11:51 AM

## 2023-01-22 ENCOUNTER — Encounter: Payer: Self-pay | Admitting: Ophthalmology

## 2023-02-10 ENCOUNTER — Ambulatory Visit
Admission: RE | Admit: 2023-02-10 | Discharge: 2023-02-10 | Disposition: A | Discharge status: Home / Self Care | Payer: Medicare Other | Source: Ambulatory Visit | Attending: Family Medicine | Admitting: Family Medicine

## 2023-02-10 ENCOUNTER — Other Ambulatory Visit: Payer: Self-pay

## 2023-02-10 VITALS — BP 149/79 | HR 81 | Temp 97.3°F | Resp 20

## 2023-02-10 DIAGNOSIS — W19XXXA Unspecified fall, initial encounter: Secondary | ICD-10-CM

## 2023-02-10 DIAGNOSIS — S80212A Abrasion, left knee, initial encounter: Secondary | ICD-10-CM

## 2023-02-10 DIAGNOSIS — M25562 Pain in left knee: Secondary | ICD-10-CM

## 2023-02-10 DIAGNOSIS — S7002XA Contusion of left hip, initial encounter: Secondary | ICD-10-CM

## 2023-02-10 MED ORDER — MUPIROCIN 2 % EX OINT: 1.0000 | TOPICAL_OINTMENT | Freq: Two times a day (BID) | CUTANEOUS | 0 refills | Status: DC

## 2023-02-10 MED ORDER — CYCLOBENZAPRINE HCL 5 MG PO TABS: 5.0000 mg | ORAL_TABLET | Freq: Two times a day (BID) | ORAL | 0 refills | Status: DC | PRN

## 2023-02-10 NOTE — ED Triage Notes (Addendum)
 Pt reports fell on gravel after tripping on last step. Pt reports left hip and left knee pain ever since. Small abrasion noted to left knee. Pt able to bear weight. Denies blood thinner,hitting head, or loc.

## 2023-02-10 NOTE — ED Provider Notes (Signed)
 [facility]    CSN: [id number] Arrival date & time: 02/10/23  1646      History   Chief Complaint Chief Complaint  Patient presents with   Fall    HPI Diane Torres is a 83 y.o. female.   Patient presenting today with left hip pain and left knee pain after a fall that occurred this afternoon.  States she fell on the bottom step and hit the gravel with these areas.  Denies uncontrolled bleeding, decreased range of motion, numbness, tingling, weakness, bowel or bladder incontinence, saddle anesthesias, head injury, loss of consciousness.  Not currently on any blood thinners.  States she takes Tylenol  and meloxicam daily for chronic back pain, otherwise has not tried anything for symptoms.    Past Medical History:  Diagnosis Date   CAP (community acquired pneumonia) 05/2018   Diverticulitis    CT verified in 2015, 2018, 2019, 2020, 2023   GERD (gastroesophageal reflux disease)    Hypertension    Hypothyroidism    Impaired fasting glucose    Mixed hyperlipidemia     Patient Active Problem List   Diagnosis Date Noted   Vaginal yeast infection 04/22/2021   Unspecified protein-calorie malnutrition (HCC) 04/17/2021   Abnormal breath sounds 04/17/2021   Chronic non-seasonal allergic rhinitis 04/16/2021   Metabolic acidosis, normal anion gap (NAG) 04/09/2021   Renal cyst, right 04/09/2021   Hyponatremia 04/09/2021   Normocytic anemia 04/08/2021   Generalized weakness 04/08/2021   Sepsis (HCC) 04/07/2021   Acute respiratory failure with hypoxia (HCC) 04/07/2021   Hyperkalemia 04/07/2021   AKI (acute kidney injury) (HCC) 04/06/2021   Pituitary mass (HCC) 10/28/2020   Intracranial mass 10/28/2020   Macrocytic anemia 10/28/2020   Chronic back pain 10/28/2020   Essential hypertension 10/28/2020   Syncope 10/27/2020   Diarrhea 12/02/2018   Diverticulosis 12/02/2018   Diverticulitis 06/30/2017   Rectal bleeding 06/30/2017   Leukocytosis 06/30/2017   CKD (chronic  kidney disease) stage 3, GFR 30-59 ml/min (HCC) 06/30/2017   LLQ pain 12/23/2016   GERD (gastroesophageal reflux disease) 06/16/2016   Gastroesophageal reflux disease with esophagitis 06/16/2016   Precordial pain 03/10/2013   Impaired fasting glucose 03/09/2013   Mixed hyperlipidemia 03/09/2013   Hypothyroidism 03/09/2013    Past Surgical History:  Procedure Laterality Date   CATARACT EXTRACTION W/PHACO Right 01/08/2023   Procedure: CATARACT EXTRACTION PHACO AND INTRAOCULAR LENS PLACEMENT (IOC) RIGHT  10.65  00:58.3;  Surgeon: Mittie Gaskin, MD;  Location: [facility];  Service: Ophthalmology;  Laterality: Right;   CATARACT EXTRACTION W/PHACO Left 01/21/2023   Procedure: CATARACT EXTRACTION PHACO AND INTRAOCULAR LENS PLACEMENT (IOC) LEFT 5.73 00:52.6;  Surgeon: Mittie Gaskin, MD;  Location: [facility];  Service: Ophthalmology;  Laterality: Left;   COLONOSCOPY     COLONOSCOPY N/A 12/12/2015   Dr. Shaaron: one 3 mm tubular adenoma in rectum s/p removal. Diverticulosis in entire colon. Internal hemorrhoids.    COLONOSCOPY WITH PROPOFOL  N/A 08/21/2021   Procedure: COLONOSCOPY WITH PROPOFOL ;  Surgeon: Shaaron Lamar HERO, MD;  Location: [facility];  Service: Endoscopy;  Laterality: N/A;  10:15am   ESOPHAGOGASTRODUODENOSCOPY N/A 03/19/2016   Dr. Shaaron: LA Grade A esophagitis, empiric dilation, normal stomach, normal duodenum   Left lobe thyroidectomy     MALONEY DILATION N/A 03/19/2016   Procedure: AGAPITO DILATION;  Surgeon: Lamar HERO Shaaron, MD;  Location: [facility];  Service: Endoscopy;  Laterality: N/A;   POLYPECTOMY  12/12/2015   Procedure: POLYPECTOMY;  Surgeon: Lamar HERO Shaaron,  MD;  Location: [facility];  Service: Endoscopy;;  colon   POLYPECTOMY  08/21/2021   Procedure: POLYPECTOMY;  Surgeon: Shaaron Lamar HERO, MD;  Location: [facility];  Service: Endoscopy;;  splenic flexure;   RECTOCELE REPAIR     TOTAL ABDOMINAL HYSTERECTOMY W/ BILATERAL  SALPINGOOPHORECTOMY      OB History     Gravida  2   Para  2   Term  2   Preterm      AB      Living  2      SAB      IAB      Ectopic      Multiple      Live Births               Home Medications    Prior to Admission medications   Medication Sig Start Date End Date Taking? Authorizing Provider  cyclobenzaprine  (FLEXERIL ) 5 MG tablet Take 1 tablet (5 mg total) by mouth 2 (two) times daily as needed for muscle spasms. Do not drink alcohol or drive while taking this medication.  May cause drowsiness. 02/10/23  Yes Stuart Vernell Norris, PA-C  mupirocin  ointment (BACTROBAN ) 2 % Apply 1 Application topically 2 (two) times daily. 02/10/23  Yes Stuart Vernell Norris, PA-C  acetaminophen  (TYLENOL ) 500 MG tablet Take 1 tablet (500 mg total) by mouth every 6 (six) hours as needed. 10/29/20   Sherrill Cable Latif, DO  benzonatate  (TESSALON ) 100 MG capsule Take 1 capsule (100 mg total) by mouth every 8 (eight) hours. 01/02/23   Stuart Vernell Norris, PA-C  dicyclomine  (BENTYL ) 10 MG capsule Take 1 capsule (10 mg total) by mouth 2 (two) times daily as needed (loose stool). Hold if no bowel movement in 24 hours. 03/19/22   Ezzard Sonny RAMAN, PA-C  escitalopram  (LEXAPRO ) 5 MG tablet Take 5 mg by mouth every other day. 10/01/22   [provider]  loratadine  (CLARITIN ) 10 MG tablet Take 10 mg by mouth daily.    [provider]  losartan  (COZAAR ) 100 MG tablet Take 1 tablet (100 mg total) by mouth daily. 04/24/21   Landy Barnie RAMAN, NP  meloxicam (MOBIC) 7.5 MG tablet Take 7.5 mg by mouth daily.    [provider]  pantoprazole  (PROTONIX ) 40 MG tablet Take 40 mg by mouth daily.    [provider]  simvastatin  (ZOCOR ) 40 MG tablet Take 1 tablet (40 mg total) by mouth at bedtime. 04/24/21   Landy Barnie RAMAN, NP    Family History Family History  Problem Relation Age of Onset   CAD Brother        2 brothers with CAD in their 8s   Crohn's disease  Brother        had colon resection   Pancreatic cancer Brother    CAD Father    Heart failure Mother    Diabetes Mellitus II Mother    Colon cancer Neg Hx     Social History Social History   Tobacco Use   Smoking status: Never   Smokeless tobacco: Never  Vaping Use   Vaping status: Never Used  Substance Use Topics   Alcohol use: No   Drug use: No     Allergies   Bee venom   Review of Systems Review of Systems Per HPI  Physical Exam Triage Vital Signs ED Triage Vitals  Encounter Vitals Group     BP 02/10/23 1658 (!) 149/79     Systolic BP  Percentile --      Diastolic BP Percentile --      Pulse Rate 02/10/23 1658 81     Resp 02/10/23 1658 20     Temp 02/10/23 1658 (!) 97.3 F (36.3 C)     Temp Source 02/10/23 1658 Oral     SpO2 02/10/23 1658 97 %     Weight --      Height --      Head Circumference --      Peak Flow --      Pain Score 02/10/23 1657 5     Pain Loc --      Pain Education --      Exclude from Growth Chart --    No data found.  Updated Vital Signs BP (!) 149/79 (BP Location: Right Arm)   Pulse 81   Temp (!) 97.3 F (36.3 C) (Oral)   Resp 20   SpO2 97%   Visual Acuity Right Eye Distance:   Left Eye Distance:   Bilateral Distance:    Right Eye Near:   Left Eye Near:    Bilateral Near:     Physical Exam Vitals and nursing note reviewed.  Constitutional:      Appearance: Normal appearance. She is not ill-appearing.  HENT:     Head: Atraumatic.  Eyes:     Extraocular Movements: Extraocular movements intact.     Conjunctiva/sclera: Conjunctivae normal.  Cardiovascular:     Rate and Rhythm: Normal rate and regular rhythm.     Heart sounds: Normal heart sounds.  Pulmonary:     Effort: Pulmonary effort is normal.     Breath sounds: Normal breath sounds.  Musculoskeletal:        General: Swelling, tenderness and signs of injury present. No deformity. Normal range of motion.     Cervical back: Normal range of motion and neck  supple.     Comments: Range of motion of the left hip and left knee intact, no bone deformities palpable, nontender to palpation of the joints, negative straight leg raise bilaterally.  Normal gait  Skin:    General: Skin is warm.     Comments: Superficial abrasions to the area surrounding the left knee with bruising.  No active bleeding or drainage, no active foreign body in wounds.  Slight bruising forming to the left posterior hip/buttock  Neurological:     Mental Status: She is alert and oriented to person, place, and time.     Motor: No weakness.     Gait: Gait normal.     Comments: Bilateral lower extremities neurovascular intact  Psychiatric:        Mood and Affect: Mood normal.        Thought Content: Thought content normal.        Judgment: Judgment normal.      UC Treatments / Results  Labs (all labs ordered are listed, but only abnormal results are displayed) Labs Reviewed - No data to display  EKG   Radiology No results found.  Procedures Procedures (including critical care time)  Medications Ordered in UC Medications - No data to display  Initial Impression / Assessment and Plan / UC Course  I have reviewed the triage vital signs and the nursing notes.  Pertinent labs & imaging results that were available during my care of the patient were reviewed by me and considered in my medical decision making (see chart for details).     Wounds to the left knee dressed and  discussed home wound care with mupirocin  and nonstick dressings.  X-ray imaging deferred to both areas with shared decision making as exam is very reassuring.  Treat with low-dose Flexeril , continued chronic pain regimen, heat, rest.  Return for worsening symptoms.  Final Clinical Impressions(s) / UC Diagnoses   Final diagnoses:  Contusion of left hip, initial encounter  Acute pain of left knee  Abrasion of left knee, initial encounter  Fall, initial encounter     Discharge Instructions       Clean the area of abrasions with soap and water  once a day and apply the mupirocin  ointment and a nonstick dressing.  I have sent a muscle relaxer and you may continue taking your daily pain regimen, using heat and stretches.    ED Prescriptions     Medication Sig Dispense Auth. Provider   mupirocin  ointment (BACTROBAN ) 2 % Apply 1 Application topically 2 (two) times daily. 22 g Stuart Vernell Norris, PA-C   cyclobenzaprine  (FLEXERIL ) 5 MG tablet Take 1 tablet (5 mg total) by mouth 2 (two) times daily as needed for muscle spasms. Do not drink alcohol or drive while taking this medication.  May cause drowsiness. 10 tablet Stuart Vernell Norris, [place]      PDMP not reviewed this encounter.   Stuart Vernell Norris, [place] 02/10/23 TYRA

## 2023-02-10 NOTE — Discharge Instructions (Signed)
 Clean the area of abrasions with soap and water once a day and apply the mupirocin ointment and a nonstick dressing.  I have sent a muscle relaxer and you may continue taking your daily pain regimen, using heat and stretches.

## 2023-06-05 ENCOUNTER — Encounter: Payer: Self-pay | Admitting: *Deleted

## 2023-06-05 ENCOUNTER — Telehealth: Payer: Self-pay | Admitting: *Deleted

## 2023-06-05 ENCOUNTER — Encounter: Payer: Self-pay | Admitting: Gastroenterology

## 2023-06-05 ENCOUNTER — Ambulatory Visit: Admitting: Gastroenterology

## 2023-06-05 VITALS — BP 137/76 | HR 84 | Temp 97.8°F | Ht 59.0 in | Wt 141.0 lb

## 2023-06-05 DIAGNOSIS — R109 Unspecified abdominal pain: Secondary | ICD-10-CM | POA: Diagnosis not present

## 2023-06-05 DIAGNOSIS — R159 Full incontinence of feces: Secondary | ICD-10-CM | POA: Diagnosis not present

## 2023-06-05 DIAGNOSIS — R195 Other fecal abnormalities: Secondary | ICD-10-CM | POA: Diagnosis not present

## 2023-06-05 DIAGNOSIS — R6881 Early satiety: Secondary | ICD-10-CM

## 2023-06-05 DIAGNOSIS — K2100 Gastro-esophageal reflux disease with esophagitis, without bleeding: Secondary | ICD-10-CM

## 2023-06-05 DIAGNOSIS — Z8719 Personal history of other diseases of the digestive system: Secondary | ICD-10-CM

## 2023-06-05 DIAGNOSIS — R630 Anorexia: Secondary | ICD-10-CM

## 2023-06-05 DIAGNOSIS — R634 Abnormal weight loss: Secondary | ICD-10-CM

## 2023-06-05 DIAGNOSIS — R140 Abdominal distension (gaseous): Secondary | ICD-10-CM

## 2023-06-05 DIAGNOSIS — R112 Nausea with vomiting, unspecified: Secondary | ICD-10-CM | POA: Diagnosis not present

## 2023-06-05 MED ORDER — AMOXICILLIN-POT CLAVULANATE 875-125 MG PO TABS: 1.0000 | ORAL_TABLET | Freq: Two times a day (BID) | ORAL | 0 refills | Status: AC

## 2023-06-05 NOTE — Progress Notes (Signed)
 GI Office Note    Referring Provider: Omie Bickers, MD Primary Care Physician:  Omie Bickers, MD  Primary Gastroenterologist: Rheba Cedar, MD   Chief Complaint   Chief Complaint  Patient presents with   Follow-up    Having same issues with bowels and seeing mucus in stools. States that she is not having any abdominal pain.    History of Present Illness   Diane Torres is a 83 y.o. female presenting today for follow up. Last seen 10/2022.  She has a history of recurrent diverticulitis/colitis.  Family history of Crohn's colitis, brother.  Patient not feeling the best. She is having chronic poor appetite/early satiety. Lost some weight after her spouse passed one year ago but weight loss has seemed to stabilize a bit. Down two pounds since 01/2023. She suffers with low back pain with pain into her left leg. States she has referral to neurosurgeon pending to be evaluated for her spinal stenosis. She has a lot of nausea and sometimes vomiting which seems to be aggravated by her back pain. Denies heartburn on PPI. No dysphagia. BMs normal once daily. She does not fecal seepage every time she urinates. She also passes a lot of mucous from her rectum even without BMs, worse the past few weeks. Has to wear panty liner. No brbpr or melena.  Wt Readings from Last 10 Encounters:  06/05/23 141 lb (64 kg)  01/21/23 143 lb (64.9 kg)  01/08/23 143 lb 4.8 oz (65 kg)  10/08/22 144 lb 12.8 oz (65.7 kg)  06/16/22 147 lb (66.7 kg)  03/11/22 151 lb (68.5 kg)  08/21/21 148 lb (67.1 kg)  07/02/21 148 lb (67.1 kg)  06/05/21 141 lb (64 kg)  04/24/21 153 lb (69.4 kg)    Colonoscopy 08/2021:  -scattered diverticulosis -6mm polyp removed (inflammatory)   EGD 03/2016: -LA Grade A esophagitis -dilation of esophagus   Medications   Current Outpatient Medications  Medication Sig Dispense Refill   acetaminophen  (TYLENOL ) 500 MG tablet Take 1 tablet (500 mg total) by mouth every 6 (six) hours as  needed. 30 tablet 0   amLODipine (NORVASC) 5 MG tablet Take 5 mg by mouth daily.     escitalopram (LEXAPRO) 5 MG tablet Take 5 mg by mouth every other day.     loratadine  (CLARITIN ) 10 MG tablet Take 10 mg by mouth daily.     losartan  (COZAAR ) 100 MG tablet Take 1 tablet (100 mg total) by mouth daily. 30 tablet 0   meloxicam (MOBIC) 7.5 MG tablet Take 7.5 mg by mouth in the morning and at bedtime.     pantoprazole  (PROTONIX ) 40 MG tablet Take 40 mg by mouth daily.     simvastatin  (ZOCOR ) 40 MG tablet Take 1 tablet (40 mg total) by mouth at bedtime. 30 tablet 0   No current facility-administered medications for this visit.    Allergies   Allergies as of 06/05/2023 - Review Complete 06/05/2023  Allergen Reaction Noted   Bee venom Anaphylaxis 03/18/2016         Review of Systems   General: Negative for anorexia,   fever, chills, fatigue, weakness. See hpi ENT: Negative for hoarseness, difficulty swallowing , nasal congestion. CV: Negative for chest pain, angina, palpitations, dyspnea on exertion, peripheral edema.  Respiratory: Negative for dyspnea at rest, dyspnea on exertion, cough, sputum, wheezing.  GI: See history of present illness. GU:  Negative for dysuria, hematuria, urinary incontinence, urinary frequency, nocturnal urination.  Endo: Negative  for unusual weight change.     Physical Exam   BP 137/76 (BP Location: Right Arm, Patient Position: Sitting, Cuff Size: Normal)   Pulse 84   Temp 97.8 F (36.6 C) (Oral)   Ht 4\' 11"  (1.499 m)   Wt 141 lb (64 kg)   SpO2 97%   BMI 28.48 kg/m    General: Well-nourished, well-developed in no acute distress.  Eyes: No icterus. Mouth: Oropharyngeal mucosa moist and pink , no lesions erythema or exudate. Lungs: Clear to auscultation bilaterally.  Heart: Regular rate and rhythm, no murmurs rubs or gallops.  Abdomen: Bowel sounds are normal, nontender, nondistended, no hepatosplenomegaly or masses,  no abdominal bruits or hernia ,  no rebound or guarding.  Rectal: large skin tag externally with fecal material present. Nontender DRE. No masses. No gross blood per rectum.   Extremities: No lower extremity edema. No clubbing or deformities. Neuro: Alert and oriented x 4   Skin: Warm and dry, no jaundice.   Psych: Alert and cooperative, normal mood and affect.  Labs   Labs from March 2025: Glucose 83, creatinine 1, BUN 20.  Labs from January 2025: White blood cell count 7700, hemoglobin 11.4, hematocrit 34.1, MCV 94, platelets 368,000, albumin 4.4, total bilirubin 0.5, alk phos 64, AST 17, ALT 10.  A1c 5.7.  Imaging Studies   No results found.  Assessment/Plan:   Early satiety/poor appetite/intermittent nausea and vomiting/weight loss: may be multifactorial including grief, back pain, gastritis/PUD. Recommend further evaluation. -CT A/P with IV/oral contrast -continue pantoprazole  40mg  daily -cannot rule out need for EGD given use of NSAIDs, she is at increased risk of PUD.  Abdominal discomfort/increased mucous in stool: history of recurrent diverticulitis/colitis currently without diarrhea but noting fecal seepage and increased mucous in stool. Cannot rule out diverticulitis, colitis. Recommend further evaluation.  -She is going out of town this weekend, I have provided her with augmentin  to take bid for 10 days IF her symptoms worsen.  -CT A/P with IV/oral contrast.    Trudie Fuse. Harles Lied, MHS, PA-C [facility]

## 2023-06-05 NOTE — Telephone Encounter (Signed)
 [employer] PA: CPT Code 16109 Description: CT ABDOMEN & PELVIS W/ Case Number: [id number] Review Date: 06/05/2023 9:45:14 AM Expiration Date: N/A Status: This member's benefit plan did not require a prior authorization for this request.

## 2023-06-05 NOTE — Patient Instructions (Signed)
 Continue pantoprazole  40mg  daily before breakfast. I have sent in prescription for augmentin  in case you start having abdominal pain similar to prior episodes of diverticulitis.  CT Abd and pelvis to be scheduled.

## 2023-06-17 ENCOUNTER — Ambulatory Visit
Admission: RE | Admit: 2023-06-17 | Discharge: 2023-06-17 | Disposition: A | Discharge status: Home / Self Care | Source: Ambulatory Visit

## 2023-06-17 ENCOUNTER — Other Ambulatory Visit: Payer: Self-pay

## 2023-06-17 VITALS — BP 127/81 | HR 95 | Temp 98.3°F | Resp 20

## 2023-06-17 DIAGNOSIS — U071 COVID-19: Secondary | ICD-10-CM | POA: Diagnosis not present

## 2023-06-17 DIAGNOSIS — B349 Viral infection, unspecified: Secondary | ICD-10-CM

## 2023-06-17 DIAGNOSIS — J029 Acute pharyngitis, unspecified: Secondary | ICD-10-CM | POA: Insufficient documentation

## 2023-06-17 LAB — POC COVID19/FLU A&B COMBO
Covid Antigen, POC: NEGATIVE
Influenza A Antigen, POC: NEGATIVE
Influenza B Antigen, POC: NEGATIVE

## 2023-06-17 LAB — POCT RAPID STREP A (OFFICE): Rapid Strep A Screen: NEGATIVE

## 2023-06-17 MED ORDER — GUAIFENESIN 100 MG/5ML PO LIQD: 10.0000 mL | Freq: Four times a day (QID) | ORAL | 0 refills | Status: DC | PRN

## 2023-06-17 MED ORDER — NYSTATIN 100000 UNIT/ML MT SUSP: 5.0000 mL | Freq: Four times a day (QID) | OROMUCOSAL | 0 refills | Status: DC | PRN

## 2023-06-17 NOTE — ED Triage Notes (Signed)
 Pt reports emesis, cough, sore throat,chills since yesterday. Denies any known fevers.

## 2023-06-17 NOTE — ED Provider Notes (Signed)
 [facility]    CSN: [id number] Arrival date & time: 06/17/23  1039      History   Chief Complaint Chief Complaint  Patient presents with   Sore Throat    Cough and sore throat - Entered by patient    HPI Diane Torres is a 83 y.o. female.   The history is provided by the patient.   Patient presents with a 1 day history of cough, sore throat, and chills.  She reports 1 episode of vomiting this morning.  Denies fever, headache, nasal congestion, runny nose, wheezing, difficulty breathing, chest pain, abdominal pain, nausea, vomiting, diarrhea, or rash.  Patient denies any obvious known sick contacts.  States she has been taking TheraFlu and Tylenol  for symptoms.  She is currently under the care of GI for mucus in her stool, decreased appetite, nausea, and vomiting.  Past Medical History:  Diagnosis Date   CAP (community acquired pneumonia) 05/2018   Diverticulitis    CT verified in 2015, 2018, 2019, 2020, 2023   GERD (gastroesophageal reflux disease)    Hypertension    Hypothyroidism    Impaired fasting glucose    Mixed hyperlipidemia     Patient Active Problem List   Diagnosis Date Noted   Vaginal yeast infection 04/22/2021   Unspecified protein-calorie malnutrition (HCC) 04/17/2021   Abnormal breath sounds 04/17/2021   Chronic non-seasonal allergic rhinitis 04/16/2021   Metabolic acidosis, normal anion gap (NAG) 04/09/2021   Renal cyst, right 04/09/2021   Hyponatremia 04/09/2021   Normocytic anemia 04/08/2021   Generalized weakness 04/08/2021   Sepsis (HCC) 04/07/2021   Acute respiratory failure with hypoxia (HCC) 04/07/2021   Hyperkalemia 04/07/2021   AKI (acute kidney injury) (HCC) 04/06/2021   Pituitary mass (HCC) 10/28/2020   Intracranial mass 10/28/2020   Macrocytic anemia 10/28/2020   Chronic back pain 10/28/2020   Essential hypertension 10/28/2020   Syncope 10/27/2020   Diarrhea 12/02/2018   Diverticulosis 12/02/2018   Diverticulitis  06/30/2017   Rectal bleeding 06/30/2017   Leukocytosis 06/30/2017   CKD (chronic kidney disease) stage 3, GFR 30-59 ml/min (HCC) 06/30/2017   LLQ pain 12/23/2016   GERD (gastroesophageal reflux disease) 06/16/2016   Gastroesophageal reflux disease with esophagitis 06/16/2016   Precordial pain 03/10/2013   Impaired fasting glucose 03/09/2013   Mixed hyperlipidemia 03/09/2013   Hypothyroidism 03/09/2013    Past Surgical History:  Procedure Laterality Date   CATARACT EXTRACTION W/PHACO Right 01/08/2023   Procedure: CATARACT EXTRACTION PHACO AND INTRAOCULAR LENS PLACEMENT (IOC) RIGHT  10.65  00:58.3;  Surgeon: Annell Kidney, MD;  Location: [facility];  Service: Ophthalmology;  Laterality: Right;   CATARACT EXTRACTION W/PHACO Left 01/21/2023   Procedure: CATARACT EXTRACTION PHACO AND INTRAOCULAR LENS PLACEMENT (IOC) LEFT 5.73 00:52.6;  Surgeon: Annell Kidney, MD;  Location: [facility];  Service: Ophthalmology;  Laterality: Left;   COLONOSCOPY     COLONOSCOPY N/A 12/12/2015   Dr. Riley Cheadle: one 3 mm tubular adenoma in rectum s/p removal. Diverticulosis in entire colon. Internal hemorrhoids.    COLONOSCOPY WITH PROPOFOL  N/A 08/21/2021   Procedure: COLONOSCOPY WITH PROPOFOL ;  Surgeon: Suzette Espy, MD;  Location: [facility];  Service: Endoscopy;  Laterality: N/A;  10:15am   ESOPHAGOGASTRODUODENOSCOPY N/A 03/19/2016   Dr. Riley Cheadle: LA Grade A esophagitis, empiric dilation, normal stomach, normal duodenum   Left lobe thyroidectomy     MALONEY DILATION N/A 03/19/2016   Procedure: Londa Rival DILATION;  Surgeon: Suzette Espy, MD;  Location: [facility];  Service:  Endoscopy;  Laterality: N/A;   POLYPECTOMY  12/12/2015   Procedure: POLYPECTOMY;  Surgeon: Suzette Espy, MD;  Location: [facility];  Service: Endoscopy;;  colon   POLYPECTOMY  08/21/2021   Procedure: POLYPECTOMY;  Surgeon: Suzette Espy, MD;  Location: [facility];  Service: Endoscopy;;  splenic flexure;    RECTOCELE REPAIR     TOTAL ABDOMINAL HYSTERECTOMY W/ BILATERAL SALPINGOOPHORECTOMY      OB History     Gravida  2   Para  2   Term  2   Preterm      AB      Living  2      SAB      IAB      Ectopic      Multiple      Live Births               Home Medications    Prior to Admission medications   Medication Sig Start Date End Date Taking? Authorizing Provider  guaiFENesin  (ROBITUSSIN) 100 MG/5ML liquid Take 10 mLs by mouth every 6 (six) hours as needed for cough or to loosen phlegm. 06/17/23  Yes Leath-Warren, Belen Bowers, NP  magic mouthwash (nystatin, hydrocortisone, diphenhydrAMINE , lidocaine ) suspension Swish and swallow 5 mLs 4 (four) times daily as needed for mouth pain. 06/17/23  Yes Leath-Warren, Belen Bowers, NP  acetaminophen  (TYLENOL ) 500 MG tablet Take 1 tablet (500 mg total) by mouth every 6 (six) hours as needed. 10/29/20   Sheikh, Omair Latif, DO  amLODipine (NORVASC) 5 MG tablet Take 5 mg by mouth daily. 05/31/23   [provider]  escitalopram (LEXAPRO) 5 MG tablet Take 5 mg by mouth every other day. 10/01/22   [provider]  loratadine  (CLARITIN ) 10 MG tablet Take 10 mg by mouth daily.    [provider]  losartan  (COZAAR ) 100 MG tablet Take 1 tablet (100 mg total) by mouth daily. 04/24/21   Marilyne Shu, NP  meloxicam (MOBIC) 7.5 MG tablet Take 7.5 mg by mouth in the morning and at bedtime.    [provider]  pantoprazole  (PROTONIX ) 40 MG tablet Take 40 mg by mouth daily.    [provider]  simvastatin  (ZOCOR ) 40 MG tablet Take 1 tablet (40 mg total) by mouth at bedtime. 04/24/21   Marilyne Shu, NP    Family History Family History  Problem Relation Age of Onset   CAD Brother        2 brothers with CAD in their 30s   Crohn's disease Brother        had colon resection   Pancreatic cancer Brother    CAD Father    Heart failure Mother    Diabetes Mellitus II Mother    Colon cancer Neg Hx      Social History Social History   Tobacco Use   Smoking status: Never   Smokeless tobacco: Never  Vaping Use   Vaping status: Never Used  Substance Use Topics   Alcohol use: No   Drug use: No     Allergies   Bee venom   Review of Systems Review of Systems Per HPI  Physical Exam Triage Vital Signs ED Triage Vitals [06/17/23 1049]  Encounter Vitals Group     BP 127/81     Systolic BP Percentile      Diastolic BP Percentile      Pulse Rate 95     Resp 20     Temp  98.3 F (36.8 C)     Temp Source Oral     SpO2 94 %     Weight      Height      Head Circumference      Peak Flow      Pain Score 7     Pain Loc      Pain Education      Exclude from Growth Chart    No data found.  Updated Vital Signs BP 127/81 (BP Location: Right Arm)   Pulse 95   Temp 98.3 F (36.8 C) (Oral)   Resp 20   SpO2 94%   Visual Acuity Right Eye Distance:   Left Eye Distance:   Bilateral Distance:    Right Eye Near:   Left Eye Near:    Bilateral Near:     Physical Exam Vitals and nursing note reviewed.  Constitutional:      General: She is not in acute distress.    Appearance: She is well-developed.  HENT:     Head: Normocephalic.     Right Ear: Tympanic membrane, ear canal and external ear normal.     Left Ear: Tympanic membrane, ear canal and external ear normal.     Nose: Nose normal.     Mouth/Throat:     Mouth: Mucous membranes are moist.     Pharynx: Posterior oropharyngeal erythema present.  Eyes:     Extraocular Movements: Extraocular movements intact.     Pupils: Pupils are equal, round, and reactive to light.  Cardiovascular:     Rate and Rhythm: Normal rate and regular rhythm.     Pulses: Normal pulses.     Heart sounds: Normal heart sounds.  Pulmonary:     Effort: Pulmonary effort is normal.     Breath sounds: Normal breath sounds.  Abdominal:     General: Bowel sounds are normal.     Palpations: Abdomen is soft.     Tenderness: There is no  abdominal tenderness.  Musculoskeletal:     Cervical back: Normal range of motion.  Skin:    General: Skin is warm and dry.  Neurological:     General: No focal deficit present.     Mental Status: She is alert and oriented to person, place, and time.  Psychiatric:        Mood and Affect: Mood normal.        Behavior: Behavior normal.      UC Treatments / Results  Labs (all labs ordered are listed, but only abnormal results are displayed) Labs Reviewed  CULTURE, GROUP A STREP ([facility])  POC COVID19/FLU A&B COMBO  POCT RAPID STREP A (OFFICE)    EKG   Radiology No results found.  Procedures Procedures (including critical care time)  Medications Ordered in UC Medications - No data to display  Initial Impression / Assessment and Plan / UC Course  I have reviewed the triage vital signs and the nursing notes.  Pertinent labs & imaging results that were available during my care of the patient were reviewed by me and considered in my medical decision making (see chart for details).  The rapid strep test and COVID/flu test were negative.  A throat culture is pending.  On exam, lung sounds are clear throughout, room air sats at 94%.  Patient has been afebrile.  Symptoms consistent with a viral illness/sore throat.  Symptomatic treatment provided with guaifenesin  100 mg and Magic mouthwash.  Supportive care recommendations were provided and discussed with  the patient to include over-the-counter analgesics, warm salt water  gargles, and a soft diet.  Discussed indications with patient regarding follow-up.  Patient was in agreement with this plan of care and verbalizes understanding.  All questions were answered.  Patient stable for discharge.   Final Clinical Impressions(s) / UC Diagnoses   Final diagnoses:  Sore throat  Viral illness     Discharge Instructions      The rapid strep test and COVID/flu test were negative.  A throat culture has been ordered.  You will be contacted  if the pending test result is abnormal. Take medication as prescribed. Continue over-the-counter Tylenol  as needed for pain, fever, or general discomfort. Make sure you are drinking plenty of fluids and getting plenty of rest.  Recommend at least 5-7 8 ounce glasses of water  daily while symptoms persist. Warm salt water  gargles 3-4 times daily as needed for throat pain or discomfort.  Also recommend the use of Chloraseptic throat spray or throat lozenges as needed for throat pain or discomfort. Recommend a soft diet to include soup, broth, yogurt, pudding, or Jell-O while symptoms persist. Symptoms should improve over the next 5 to 7 days.  If symptoms fail to improve, or appear to be worsening, you may follow-up in this clinic or with your primary care physician for further evaluation. Follow-up as needed.   ED Prescriptions     Medication Sig Dispense Auth. Provider   magic mouthwash (nystatin, hydrocortisone, diphenhydrAMINE , lidocaine ) suspension Swish and swallow 5 mLs 4 (four) times daily as needed for mouth pain. 540 mL Leath-Warren, Belen Bowers, NP   guaiFENesin  (ROBITUSSIN) 100 MG/5ML liquid Take 10 mLs by mouth every 6 (six) hours as needed for cough or to loosen phlegm. 300 mL Leath-Warren, Belen Bowers, NP      PDMP not reviewed this encounter.   Hardy Lia, NP 06/17/23 1137

## 2023-06-17 NOTE — Discharge Instructions (Addendum)
 The rapid strep test and COVID/flu test were negative.  A throat culture has been ordered.  You will be contacted if the pending test result is abnormal. Take medication as prescribed. Continue over-the-counter Tylenol  as needed for pain, fever, or general discomfort. Make sure you are drinking plenty of fluids and getting plenty of rest.  Recommend at least 5-7 8 ounce glasses of water  daily while symptoms persist. Warm salt water  gargles 3-4 times daily as needed for throat pain or discomfort.  Also recommend the use of Chloraseptic throat spray or throat lozenges as needed for throat pain or discomfort. Recommend a soft diet to include soup, broth, yogurt, pudding, or Jell-O while symptoms persist. Symptoms should improve over the next 5 to 7 days.  If symptoms fail to improve, or appear to be worsening, you may follow-up in this clinic or with your primary care physician for further evaluation. Follow-up as needed.

## 2023-06-19 ENCOUNTER — Encounter (HOSPITAL_COMMUNITY): Payer: Self-pay

## 2023-06-19 ENCOUNTER — Inpatient Hospital Stay (HOSPITAL_COMMUNITY)
Admission: EM | Admit: 2023-06-19 | Discharge: 2023-06-21 | DRG: 178 | Disposition: A | Discharge status: Home / Self Care | Attending: Internal Medicine | Admitting: Internal Medicine

## 2023-06-19 ENCOUNTER — Other Ambulatory Visit: Payer: Self-pay

## 2023-06-19 DIAGNOSIS — M549 Dorsalgia, unspecified: Secondary | ICD-10-CM | POA: Diagnosis present

## 2023-06-19 DIAGNOSIS — N1832 Chronic kidney disease, stage 3b: Secondary | ICD-10-CM | POA: Diagnosis present

## 2023-06-19 DIAGNOSIS — E782 Mixed hyperlipidemia: Secondary | ICD-10-CM | POA: Diagnosis present

## 2023-06-19 DIAGNOSIS — Z79899 Other long term (current) drug therapy: Secondary | ICD-10-CM

## 2023-06-19 DIAGNOSIS — E860 Dehydration: Principal | ICD-10-CM

## 2023-06-19 DIAGNOSIS — A0839 Other viral enteritis: Secondary | ICD-10-CM | POA: Diagnosis present

## 2023-06-19 DIAGNOSIS — E861 Hypovolemia: Secondary | ICD-10-CM | POA: Diagnosis present

## 2023-06-19 DIAGNOSIS — Z800 Family history of malignant neoplasm of digestive organs: Secondary | ICD-10-CM

## 2023-06-19 DIAGNOSIS — I10 Essential (primary) hypertension: Secondary | ICD-10-CM | POA: Diagnosis present

## 2023-06-19 DIAGNOSIS — Z90710 Acquired absence of both cervix and uterus: Secondary | ICD-10-CM

## 2023-06-19 DIAGNOSIS — E039 Hypothyroidism, unspecified: Secondary | ICD-10-CM | POA: Diagnosis present

## 2023-06-19 DIAGNOSIS — R55 Syncope and collapse: Secondary | ICD-10-CM

## 2023-06-19 DIAGNOSIS — G8929 Other chronic pain: Secondary | ICD-10-CM | POA: Diagnosis present

## 2023-06-19 DIAGNOSIS — R112 Nausea with vomiting, unspecified: Secondary | ICD-10-CM | POA: Diagnosis present

## 2023-06-19 DIAGNOSIS — N183 Chronic kidney disease, stage 3 unspecified: Secondary | ICD-10-CM | POA: Diagnosis present

## 2023-06-19 DIAGNOSIS — Z91030 Bee allergy status: Secondary | ICD-10-CM

## 2023-06-19 DIAGNOSIS — R1110 Vomiting, unspecified: Secondary | ICD-10-CM | POA: Diagnosis present

## 2023-06-19 DIAGNOSIS — F32A Depression, unspecified: Secondary | ICD-10-CM | POA: Diagnosis present

## 2023-06-19 DIAGNOSIS — F419 Anxiety disorder, unspecified: Secondary | ICD-10-CM | POA: Diagnosis present

## 2023-06-19 DIAGNOSIS — E871 Hypo-osmolality and hyponatremia: Secondary | ICD-10-CM | POA: Diagnosis not present

## 2023-06-19 DIAGNOSIS — R197 Diarrhea, unspecified: Secondary | ICD-10-CM | POA: Diagnosis present

## 2023-06-19 DIAGNOSIS — Z860101 Personal history of adenomatous and serrated colon polyps: Secondary | ICD-10-CM

## 2023-06-19 DIAGNOSIS — K219 Gastro-esophageal reflux disease without esophagitis: Secondary | ICD-10-CM | POA: Diagnosis present

## 2023-06-19 DIAGNOSIS — I129 Hypertensive chronic kidney disease with stage 1 through stage 4 chronic kidney disease, or unspecified chronic kidney disease: Secondary | ICD-10-CM | POA: Diagnosis present

## 2023-06-19 DIAGNOSIS — N179 Acute kidney failure, unspecified: Secondary | ICD-10-CM | POA: Diagnosis present

## 2023-06-19 DIAGNOSIS — Z66 Do not resuscitate: Secondary | ICD-10-CM | POA: Diagnosis present

## 2023-06-19 DIAGNOSIS — U071 COVID-19: Principal | ICD-10-CM | POA: Insufficient documentation

## 2023-06-19 DIAGNOSIS — E890 Postprocedural hypothyroidism: Secondary | ICD-10-CM | POA: Diagnosis present

## 2023-06-19 DIAGNOSIS — Z833 Family history of diabetes mellitus: Secondary | ICD-10-CM

## 2023-06-19 DIAGNOSIS — Z8249 Family history of ischemic heart disease and other diseases of the circulatory system: Secondary | ICD-10-CM

## 2023-06-19 LAB — COMPREHENSIVE METABOLIC PANEL WITH GFR
ALT: 31 U/L (ref 0–44)
AST: 40 U/L (ref 15–41)
Albumin: 4.4 g/dL (ref 3.5–5.0)
Alkaline Phosphatase: 58 U/L (ref 38–126)
Anion gap: 11 (ref 5–15)
BUN: 32 mg/dL — ABNORMAL HIGH (ref 8–23)
CO2: 21 mmol/L — ABNORMAL LOW (ref 22–32)
Calcium: 9.5 mg/dL (ref 8.9–10.3)
Chloride: 94 mmol/L — ABNORMAL LOW (ref 98–111)
Creatinine, Ser: 1.77 mg/dL — ABNORMAL HIGH (ref 0.44–1.00)
GFR, Estimated: 28 mL/min — ABNORMAL LOW (ref >60)
Glucose, Bld: 108 mg/dL — ABNORMAL HIGH (ref 70–99)
Potassium: 4.4 mmol/L (ref 3.5–5.1)
Sodium: 126 mmol/L — ABNORMAL LOW (ref 135–145)
Total Bilirubin: 0.7 mg/dL (ref 0.0–1.2)
Total Protein: 8.4 g/dL — ABNORMAL HIGH (ref 6.5–8.1)

## 2023-06-19 LAB — CBC WITH DIFFERENTIAL/PLATELET
Abs Immature Granulocytes: 0.02 10*3/uL (ref 0.00–0.07)
Basophils Absolute: 0 10*3/uL (ref 0.0–0.1)
Basophils Relative: 0 %
Eosinophils Absolute: 0 10*3/uL (ref 0.0–0.5)
Eosinophils Relative: 0 %
HCT: 37.3 % (ref 36.0–46.0)
Hemoglobin: 11.9 g/dL — ABNORMAL LOW (ref 12.0–15.0)
Immature Granulocytes: 0 %
Lymphocytes Relative: 27 %
Lymphs Abs: 1.3 10*3/uL (ref 0.7–4.0)
MCH: 30.7 pg (ref 26.0–34.0)
MCHC: 31.9 g/dL (ref 30.0–36.0)
MCV: 96.1 fL (ref 80.0–100.0)
Monocytes Absolute: 0.4 10*3/uL (ref 0.1–1.0)
Monocytes Relative: 9 %
Neutro Abs: 3.1 10*3/uL (ref 1.7–7.7)
Neutrophils Relative %: 64 %
Platelets: 340 10*3/uL (ref 150–400)
RBC: 3.88 MIL/uL (ref 3.87–5.11)
RDW: 14 % (ref 11.5–15.5)
WBC: 4.9 10*3/uL (ref 4.0–10.5)
nRBC: 0 % (ref 0.0–0.2)

## 2023-06-19 LAB — RESP PANEL BY RT-PCR (RSV, FLU A&B, COVID)  RVPGX2
Influenza A by PCR: NEGATIVE
Influenza B by PCR: NEGATIVE
Resp Syncytial Virus by PCR: NEGATIVE
SARS Coronavirus 2 by RT PCR: POSITIVE — AB

## 2023-06-19 LAB — LIPASE, BLOOD: Lipase: 55 U/L — ABNORMAL HIGH (ref 11–51)

## 2023-06-19 MED ORDER — ONDANSETRON HCL 4 MG/2ML IJ SOLN
4.0000 mg | Freq: Once | INTRAMUSCULAR | Status: AC
  Administered 2023-06-19: 4 mg via INTRAVENOUS
  Filled 2023-06-19: qty 2

## 2023-06-19 MED ORDER — ESCITALOPRAM OXALATE 10 MG PO TABS
5.0000 mg | ORAL_TABLET | ORAL | Status: DC
  Administered 2023-06-19: 5 mg via ORAL
  Filled 2023-06-19 (×2): qty 1

## 2023-06-19 MED ORDER — PANTOPRAZOLE SODIUM 40 MG PO TBEC
40.0000 mg | DELAYED_RELEASE_TABLET | Freq: Every day | ORAL | Status: DC
  Administered 2023-06-19 – 2023-06-20 (×2): 40 mg via ORAL
  Filled 2023-06-19 (×3): qty 1

## 2023-06-19 MED ORDER — THIAMINE MONONITRATE 100 MG PO TABS
100.0000 mg | ORAL_TABLET | Freq: Every day | ORAL | Status: DC
  Administered 2023-06-19 – 2023-06-20 (×2): 100 mg via ORAL
  Filled 2023-06-19 (×3): qty 1

## 2023-06-19 MED ORDER — LACTATED RINGERS IV SOLN: INTRAVENOUS | Status: AC

## 2023-06-19 MED ORDER — ONDANSETRON HCL 4 MG/2ML IJ SOLN: 4.0000 mg | Freq: Four times a day (QID) | INTRAMUSCULAR | Status: DC | PRN

## 2023-06-19 MED ORDER — ENOXAPARIN SODIUM 30 MG/0.3ML IJ SOSY
30.0000 mg | PREFILLED_SYRINGE | INTRAMUSCULAR | Status: DC
  Administered 2023-06-19 – 2023-06-20 (×2): 30 mg via SUBCUTANEOUS
  Filled 2023-06-19 (×2): qty 0.3

## 2023-06-19 MED ORDER — LOSARTAN POTASSIUM 50 MG PO TABS
100.0000 mg | ORAL_TABLET | Freq: Every day | ORAL | Status: DC
Start: 2023-06-20 — End: 2023-06-19

## 2023-06-19 MED ORDER — FOLIC ACID 1 MG PO TABS
1.0000 mg | ORAL_TABLET | Freq: Every day | ORAL | Status: DC
  Administered 2023-06-19 – 2023-06-20 (×2): 1 mg via ORAL
  Filled 2023-06-19 (×3): qty 1

## 2023-06-19 MED ORDER — ONDANSETRON HCL 4 MG PO TABS: 4.0000 mg | ORAL_TABLET | Freq: Four times a day (QID) | ORAL | Status: DC | PRN

## 2023-06-19 MED ORDER — SIMVASTATIN 20 MG PO TABS
40.0000 mg | ORAL_TABLET | Freq: Every day | ORAL | Status: DC
  Administered 2023-06-19 – 2023-06-20 (×2): 40 mg via ORAL
  Filled 2023-06-19 (×2): qty 2

## 2023-06-19 MED ORDER — GUAIFENESIN 100 MG/5ML PO LIQD: 10.0000 mL | Freq: Four times a day (QID) | ORAL | Status: DC | PRN

## 2023-06-19 MED ORDER — TRAMADOL HCL 50 MG PO TABS
50.0000 mg | ORAL_TABLET | Freq: Three times a day (TID) | ORAL | Status: DC | PRN
  Administered 2023-06-19: 50 mg via ORAL
  Filled 2023-06-19: qty 1

## 2023-06-19 MED ORDER — SODIUM CHLORIDE 0.9 % IV SOLN: INTRAVENOUS | Status: DC

## 2023-06-19 MED ORDER — AMLODIPINE BESYLATE 5 MG PO TABS: 5.0000 mg | ORAL_TABLET | Freq: Every day | ORAL | Status: DC

## 2023-06-19 MED ORDER — LOPERAMIDE HCL 2 MG PO CAPS: 2.0000 mg | ORAL_CAPSULE | ORAL | Status: DC | PRN

## 2023-06-19 MED ORDER — LACTATED RINGERS IV BOLUS
1000.0000 mL | Freq: Once | INTRAVENOUS | Status: AC
  Administered 2023-06-19: 1000 mL via INTRAVENOUS

## 2023-06-19 NOTE — H&P (Signed)
 History and Physical    Patient: Diane Torres NFA:[id number] DOB: Jan 26, 1941 DOA: 06/19/2023 DOS: the patient was seen and examined on 06/19/2023 PCP: Omie Bickers, MD  Patient coming from: Home  Chief Complaint:  Chief Complaint  Patient presents with   Emesis   Diarrhea   HPI: Diane Torres is a 83 y.o. female with medical history significant of hypothyroidism, hypertension, GERD, diverticulosis, hypertension.  Patient seen for 3 days of worsening nausea, vomiting, diarrhea, cough.  Her symptoms have been worsening, so she went to urgent care where she had COVID-19 testing done.  This returned back negative.  Due to worsening symptoms and being intolerant of oral food and liquids, she returned here for evaluation.  Here, her test returned positive for COVID-19.  In addition her blood work showed an AKI and a sodium that is low.  She has been intolerant of oral foods and liquids and having multiple stools of watery, nonbloody diarrhea.  No fevers or chills, shortness of breath, chest pain, abdominal pain.  Review of Systems: As mentioned in the history of present illness. All other systems reviewed and are negative. Past Medical History:  Diagnosis Date   CAP (community acquired pneumonia) 05/2018   Diverticulitis    CT verified in 2015, 2018, 2019, 2020, 2023   GERD (gastroesophageal reflux disease)    Hypertension    Hypothyroidism    Impaired fasting glucose    Mixed hyperlipidemia    Past Surgical History:  Procedure Laterality Date   CATARACT EXTRACTION W/PHACO Right 01/08/2023   Procedure: CATARACT EXTRACTION PHACO AND INTRAOCULAR LENS PLACEMENT (IOC) RIGHT  10.65  00:58.3;  Surgeon: Annell Kidney, MD;  Location: [facility];  Service: Ophthalmology;  Laterality: Right;   CATARACT EXTRACTION W/PHACO Left 01/21/2023   Procedure: CATARACT EXTRACTION PHACO AND INTRAOCULAR LENS PLACEMENT (IOC) LEFT 5.73 00:52.6;  Surgeon: Annell Kidney, MD;  Location: [facility];  Service: Ophthalmology;  Laterality: Left;   COLONOSCOPY     COLONOSCOPY N/A 12/12/2015   Dr. Riley Cheadle: one 3 mm tubular adenoma in rectum s/p removal. Diverticulosis in entire colon. Internal hemorrhoids.    COLONOSCOPY WITH PROPOFOL  N/A 08/21/2021   Procedure: COLONOSCOPY WITH PROPOFOL ;  Surgeon: Suzette Espy, MD;  Location: [facility];  Service: Endoscopy;  Laterality: N/A;  10:15am   ESOPHAGOGASTRODUODENOSCOPY N/A 03/19/2016   Dr. Riley Cheadle: LA Grade A esophagitis, empiric dilation, normal stomach, normal duodenum   Left lobe thyroidectomy     MALONEY DILATION N/A 03/19/2016   Procedure: Londa Rival DILATION;  Surgeon: Suzette Espy, MD;  Location: [facility];  Service: Endoscopy;  Laterality: N/A;   POLYPECTOMY  12/12/2015   Procedure: POLYPECTOMY;  Surgeon: Suzette Espy, MD;  Location: [facility];  Service: Endoscopy;;  colon   POLYPECTOMY  08/21/2021   Procedure: POLYPECTOMY;  Surgeon: Suzette Espy, MD;  Location: [facility];  Service: Endoscopy;;  splenic flexure;   RECTOCELE REPAIR     TOTAL ABDOMINAL HYSTERECTOMY W/ BILATERAL SALPINGOOPHORECTOMY     Social History:  reports that she has never smoked. She has never used smokeless tobacco. She reports that she does not drink alcohol and does not use drugs.  Allergies  Allergen Reactions   Bee Venom Anaphylaxis    Family History  Problem Relation Age of Onset   CAD Brother        2 brothers with CAD in their 62s   Crohn's disease Brother        had  colon resection   Pancreatic cancer Brother    CAD Father    Heart failure Mother    Diabetes Mellitus II Mother    Colon cancer Neg Hx     Prior to Admission medications   Medication Sig Start Date End Date Taking? Authorizing Provider  acetaminophen  (TYLENOL ) 500 MG tablet Take 1 tablet (500 mg total) by mouth every 6 (six) hours as needed. 10/29/20   Sheikh, Omair Latif, DO  amLODipine (NORVASC) 5 MG tablet Take 5 mg by mouth daily. 05/31/23   [provider]  escitalopram (LEXAPRO) 5 MG tablet Take 5 mg by mouth every other day. 10/01/22   [provider]  guaiFENesin  (ROBITUSSIN) 100 MG/5ML liquid Take 10 mLs by mouth every 6 (six) hours as needed for cough or to loosen phlegm. 06/17/23   Leath-Warren, Belen Bowers, NP  loratadine  (CLARITIN ) 10 MG tablet Take 10 mg by mouth daily.    [provider]  losartan  (COZAAR ) 100 MG tablet Take 1 tablet (100 mg total) by mouth daily. 04/24/21   Marilyne Shu, NP  magic mouthwash (nystatin, hydrocortisone, diphenhydrAMINE , lidocaine ) suspension Swish and swallow 5 mLs 4 (four) times daily as needed for mouth pain. 06/17/23   Leath-Warren, Belen Bowers, NP  meloxicam (MOBIC) 7.5 MG tablet Take 7.5 mg by mouth in the morning and at bedtime.    [provider]  pantoprazole  (PROTONIX ) 40 MG tablet Take 40 mg by mouth daily.    [provider]  simvastatin  (ZOCOR ) 40 MG tablet Take 1 tablet (40 mg total) by mouth at bedtime. 04/24/21   Marilyne Shu, NP    Physical Exam: Vitals:   06/19/23 1930 06/19/23 1945 06/19/23 2000 06/19/23 2015  BP: (!) 110/50 (!) 143/47 (!) 136/55 (!) 143/44  Pulse: 67 61 67 66  Resp: 18 14 16  (!) 21  Temp:      TempSrc:      SpO2: 94% 95% 97% 97%  Weight:      Height:       General: elderly female. Awake and alert and oriented x3. No acute cardiopulmonary distress.  HEENT: Normocephalic atraumatic.  Right and left ears normal in appearance.  Pupils equal, round, reactive to light. Extraocular muscles are intact. Sclerae anicteric and noninjected.  Moist mucosal membranes. No mucosal lesions.  Neck: Neck supple without lymphadenopathy. No carotid bruits. No masses palpated.  Cardiovascular: Regular rate with normal S1-S2 sounds. No murmurs, rubs, gallops auscultated. No JVD.  Respiratory: Good respiratory effort with no wheezes, rales, rhonchi. Lungs clear to auscultation bilaterally.  No accessory muscle use. Abdomen: Soft,  nontender, nondistended. Active bowel sounds. No masses or hepatosplenomegaly  Skin: No rashes, lesions, or ulcerations.  Dry, warm to touch. 2+ dorsalis pedis and radial pulses. Musculoskeletal: No calf or leg pain. All major joints not erythematous nontender.  No upper or lower joint deformation.  Good ROM.  No contractures  Psychiatric: Intact judgment and insight. Pleasant and cooperative. Neurologic: No focal neurological deficits. Strength is 5/5 and symmetric in upper and lower extremities.  Cranial nerves II through XII are grossly intact.   Data Reviewed: Results for orders placed or performed during the hospital encounter of 06/19/23 (from the past 24 hours)  Resp panel by RT-PCR (RSV, Flu A&B, Covid) Anterior Nasal Swab     Status: Abnormal   Collection Time: 06/19/23  6:05 PM   Specimen: Anterior Nasal Swab  Result Value Ref Range   SARS Coronavirus 2 by RT PCR POSITIVE (  A) NEGATIVE   Influenza A by PCR NEGATIVE NEGATIVE   Influenza B by PCR NEGATIVE NEGATIVE   Resp Syncytial Virus by PCR NEGATIVE NEGATIVE  CBC with Differential     Status: Abnormal   Collection Time: 06/19/23  7:10 PM  Result Value Ref Range   WBC 4.9 4.0 - 10.5 K/uL   RBC 3.88 3.87 - 5.11 MIL/uL   Hemoglobin 11.9 (L) 12.0 - 15.0 g/dL   HCT 16.1 09.6 - 04.5 %   MCV 96.1 80.0 - 100.0 fL   MCH 30.7 26.0 - 34.0 pg   MCHC 31.9 30.0 - 36.0 g/dL   RDW 40.9 81.1 - 91.4 %   Platelets 340 150 - 400 K/uL   nRBC 0.0 0.0 - 0.2 %   Neutrophils Relative % 64 %   Neutro Abs 3.1 1.7 - 7.7 K/uL   Lymphocytes Relative 27 %   Lymphs Abs 1.3 0.7 - 4.0 K/uL   Monocytes Relative 9 %   Monocytes Absolute 0.4 0.1 - 1.0 K/uL   Eosinophils Relative 0 %   Eosinophils Absolute 0.0 0.0 - 0.5 K/uL   Basophils Relative 0 %   Basophils Absolute 0.0 0.0 - 0.1 K/uL   Immature Granulocytes 0 %   Abs Immature Granulocytes 0.02 0.00 - 0.07 K/uL  Comprehensive metabolic panel     Status: Abnormal   Collection Time: 06/19/23  7:10 PM   Result Value Ref Range   Sodium 126 (L) 135 - 145 mmol/L   Potassium 4.4 3.5 - 5.1 mmol/L   Chloride 94 (L) 98 - 111 mmol/L   CO2 21 (L) 22 - 32 mmol/L   Glucose, Bld 108 (H) 70 - 99 mg/dL   BUN 32 (H) 8 - 23 mg/dL   Creatinine, Ser 7.82 (H) 0.44 - 1.00 mg/dL   Calcium 9.5 8.9 - 95.6 mg/dL   Total Protein 8.4 (H) 6.5 - 8.1 g/dL   Albumin 4.4 3.5 - 5.0 g/dL   AST 40 15 - 41 U/L   ALT 31 0 - 44 U/L   Alkaline Phosphatase 58 38 - 126 U/L   Total Bilirubin 0.7 0.0 - 1.2 mg/dL   GFR, Estimated 28 (L) >60 mL/min   Anion gap 11 5 - 15  Lipase, blood     Status: Abnormal   Collection Time: 06/19/23  7:10 PM  Result Value Ref Range   Lipase 55 (H) 11 - 51 U/L    No results found.   Assessment and Plan: No notes have been filed under this hospital service. Service: Hospitalist  Principal Problem:   Hyponatremia Active Problems:   CKD (chronic kidney disease) stage 3, GFR 30-59 ml/min (HCC)   AKI (acute kidney injury) (HCC)   Essential hypertension   Hypothyroidism   Diarrhea   COVID-19 virus infection  Hyponatremia hypovolemic with diarrhea, nausea, vomiting Secondary to COVID Symptomatic treatment with IV fluids Antiemetics Loperamide Recheck sodium and creatinine in the morning COVID-19 Respiratory symptoms under control.  No shortness of breath. Antivirals not indicated AKI on Chronic kidney disease stage III Hold losartan  Recheck creatinine in the morning IV fluids for hydration Hypertension Continue other antihypertensives   Advance Care Planning:   Code Status: Limited: Do not attempt resuscitation (DNR) -DNR-LIMITED -Do Not Intubate/DNI confirmed by patient  Consults: None  Family Communication: daughter present  Severity of Illness: The appropriate patient status for this patient is INPATIENT. Inpatient status is judged to be reasonable and necessary in order to provide the required intensity  of service to ensure the patient's safety. The patient's  presenting symptoms, physical exam findings, and initial radiographic and laboratory data in the context of their chronic comorbidities is felt to place them at high risk for further clinical deterioration. Furthermore, it is not anticipated that the patient will be medically stable for discharge from the hospital within 2 midnights of admission.   * I certify that at the point of admission it is my clinical judgment that the patient will require inpatient hospital care spanning beyond 2 midnights from the point of admission due to high intensity of service, high risk for further deterioration and high frequency of surveillance required.*  Author: Warda Mcqueary J Makailee Nudelman, DO 06/19/2023 9:10 PM  For on call review [email or web address].

## 2023-06-19 NOTE — ED Provider Notes (Signed)
 [place] EMERGENCY DEPARTMENT AT [facility] Provider Note   CSN: [id number] Arrival date & time: 06/19/23  1747     History  Chief Complaint  Patient presents with   Emesis   Diarrhea    Diane Torres is a 83 y.o. female.   Emesis Associated symptoms: diarrhea   Diarrhea Associated symptoms: vomiting      This patient is an 83 year old female with a history of hypertension which is treated, history of chronic back pain including spinal stenosis which causes her chronic low back pain.  She presents after having about 5 days of symptoms including sore throat runny nose sneezing and then coughing with 2 days of vomiting and diarrhea.  She has become very dehydrated not able to hold down any food or fluids and getting lightheaded.  She denies any blood in her stools, denies fevers or chills.  She took a positive COVID test at home  Home Medications Prior to Admission medications   Medication Sig Start Date End Date Taking? Authorizing Provider  acetaminophen  (TYLENOL ) 500 MG tablet Take 1 tablet (500 mg total) by mouth every 6 (six) hours as needed. 10/29/20   Sheikh, Omair Latif, DO  amLODipine (NORVASC) 5 MG tablet Take 5 mg by mouth daily. 05/31/23   [provider]  escitalopram (LEXAPRO) 5 MG tablet Take 5 mg by mouth every other day. 10/01/22   [provider]  guaiFENesin  (ROBITUSSIN) 100 MG/5ML liquid Take 10 mLs by mouth every 6 (six) hours as needed for cough or to loosen phlegm. 06/17/23   Leath-Warren, Belen Bowers, NP  loratadine  (CLARITIN ) 10 MG tablet Take 10 mg by mouth daily.    [provider]  losartan  (COZAAR ) 100 MG tablet Take 1 tablet (100 mg total) by mouth daily. 04/24/21   Marilyne Shu, NP  magic mouthwash (nystatin, hydrocortisone, diphenhydrAMINE , lidocaine ) suspension Swish and swallow 5 mLs 4 (four) times daily as needed for mouth pain. 06/17/23   Leath-Warren, Belen Bowers, NP  meloxicam (MOBIC) 7.5 MG tablet Take 7.5  mg by mouth in the morning and at bedtime.    [provider]  pantoprazole  (PROTONIX ) 40 MG tablet Take 40 mg by mouth daily.    [provider]  simvastatin  (ZOCOR ) 40 MG tablet Take 1 tablet (40 mg total) by mouth at bedtime. 04/24/21   Marilyne Shu, NP      Allergies    Bee venom    Review of Systems   Review of Systems  Gastrointestinal:  Positive for diarrhea and vomiting.  All other systems reviewed and are negative.   Physical Exam Updated Vital Signs BP (!) 143/47   Pulse 61   Temp 98 F (36.7 C) (Oral)   Resp 14   Ht 1.499 m (4\' 11" )   Wt 63 kg   SpO2 95%   BMI 28.05 kg/m  Physical Exam Vitals and nursing note reviewed.  Constitutional:      General: She is not in acute distress.    Appearance: She is well-developed. She is diaphoretic.  HENT:     Head: Normocephalic and atraumatic.     Mouth/Throat:     Mouth: Mucous membranes are dry.     Pharynx: No oropharyngeal exudate.  Eyes:     General: No scleral icterus.       Right eye: No discharge.        Left eye: No discharge.     Conjunctiva/sclera: Conjunctivae normal.     Pupils:  Pupils are equal, round, and reactive to light.  Neck:     Thyroid : No thyromegaly.     Vascular: No JVD.  Cardiovascular:     Rate and Rhythm: Normal rate and regular rhythm.     Heart sounds: Normal heart sounds. No murmur heard.    No friction rub. No gallop.  Pulmonary:     Effort: Pulmonary effort is normal. No respiratory distress.     Breath sounds: Normal breath sounds. No wheezing or rales.  Abdominal:     General: Bowel sounds are normal. There is no distension.     Palpations: Abdomen is soft. There is no mass.     Tenderness: There is no abdominal tenderness.  Musculoskeletal:        General: No tenderness. Normal range of motion.     Cervical back: Normal range of motion and neck supple.     Right lower leg: No edema.     Left lower leg: No edema.  Lymphadenopathy:     Cervical: No  cervical adenopathy.  Skin:    General: Skin is warm.     Findings: No erythema or rash.  Neurological:     Mental Status: She is alert.     Coordination: Coordination normal.     Comments: Diffuse weakness but able to move all 4 extremities symmetrically, cranial nerves III through XII are intact, soft weak voice  Psychiatric:        Behavior: Behavior normal.     ED Results / Procedures / Treatments   Labs (all labs ordered are listed, but only abnormal results are displayed) Labs Reviewed  RESP PANEL BY RT-PCR (RSV, FLU A&B, COVID)  [id number] - Abnormal; Notable for the following components:      Result Value   SARS Coronavirus 2 by RT PCR POSITIVE (*)    All other components within normal limits  CBC WITH DIFFERENTIAL/PLATELET - Abnormal; Notable for the following components:   Hemoglobin 11.9 (*)    All other components within normal limits  COMPREHENSIVE METABOLIC PANEL WITH GFR - Abnormal; Notable for the following components:   Sodium 126 (*)    Chloride 94 (*)    CO2 21 (*)    Glucose, Bld 108 (*)    BUN 32 (*)    Creatinine, Ser 1.77 (*)    Total Protein 8.4 (*)    GFR, Estimated 28 (*)    All other components within normal limits  LIPASE, BLOOD - Abnormal; Notable for the following components:   Lipase 55 (*)    All other components within normal limits  URINALYSIS, ROUTINE W REFLEX MICROSCOPIC    EKG EKG Interpretation Date/Time:  Friday Jun 19 2023 19:25:08 EDT Ventricular Rate:  66 PR Interval:  187 QRS Duration:  82 QT Interval:  420 QTC Calculation: 440 R Axis:   20  Text Interpretation: Sinus rhythm Low voltage, precordial leads Consider anterior infarct Confirmed by Early Glisson ([id number]) on 06/19/2023 7:39:30 PM  Radiology No results found.  Procedures .Critical Care  Performed by: Early Glisson, MD Authorized by: Early Glisson, MD   Critical care provider statement:    Critical care time (minutes):  45   Critical care time was exclusive of:   Separately billable procedures and treating other patients   Critical care was necessary to treat or prevent imminent or life-threatening deterioration of the following conditions:  Dehydration and renal failure   Critical care was time spent personally by me on the following activities:  Development of treatment plan with patient or surrogate, discussions with consultants, evaluation of patient's response to treatment, examination of patient, obtaining history from patient or surrogate, review of old charts, re-evaluation of patient's condition, pulse oximetry, ordering and review of radiographic studies, ordering and review of laboratory studies and ordering and performing treatments and interventions   I assumed direction of critical care for this patient from another provider in my specialty: no     Care discussed with: admitting provider   Comments:           Medications Ordered in ED Medications  0.9 %  sodium chloride  infusion ( Intravenous New Bag/Given 06/19/23 1932)  lactated ringers  bolus 1,000 mL (1,000 mLs Intravenous New Bag/Given 06/19/23 1931)  ondansetron  (ZOFRAN ) injection 4 mg (4 mg Intravenous Given 06/19/23 1931)    ED Course/ Medical Decision Making/ A&P                                 Medical Decision Making Amount and/or Complexity of Data Reviewed ECG/medicine tests: ordered.  Risk Prescription drug management. Decision regarding hospitalization.   The patient has no significant hypoxia but has been terribly weak and not able to take her home medications.  She appears very dehydrated, she is dry in the mucous membranes, and while she was sitting in the chair waiting for a room she had a syncopal episode and went unresponsive.  The patient was brought back immediately to a room where an IV was placed and she was placed on the monitor, IV fluids were started.  I suspect the patient will need to be admitted to the hospital for COVID-19 with dehydration and  persistent nausea vomiting and diarrhea.   Co morbidities that complicate the patient evaluation  Elderly   Additional history obtained:  Additional history obtained from medical record External records from outside source obtained and reviewed including admission to the hospital about 4 months ago for removal of cataracts, no other recent admissions to the hospital, last colonoscopy was 2 years ago   Lab Tests:  I Ordered, and personally interpreted labs.  The pertinent results include: CBC without leukocytosis or significant anemia, metabolic panel shows hyponatremia, acute kidney injury with a creatinine of 1.7 and a BUN of 32 and a pattern of dehydration, COVID-positive    Cardiac Monitoring: / EKG:  The patient was maintained on a cardiac monitor.  I personally viewed and interpreted the cardiac monitored which showed an underlying rhythm of: Sinus rhythm   Problem List / ED Course / Critical interventions / Medication management  The patient appears dehydrated, she continues to vomit and have diarrhea, she has not had much in the ED but is very symptomatic.  She is not tolerating her home medications and now has an acute kidney injury that will require IV hydration.  She is also hyponatremic, she will be admitted to the hospital to higher level of care she has an acute kidney injury.` I have reviewed the patients home medicines and have made adjustments as needed   Consultations Obtained:  I requested consultation with the hospitalist,  and discussed lab and imaging findings as well as pertinent plan - they recommend: admission   Social Determinants of Health:  Elderly, covid +   Test / Admission - Considered:  Admit.         Final Clinical Impression(s) / ED Diagnoses Final diagnoses:  Dehydration  Diarrhea, unspecified type  COVID-19  AKI (acute kidney injury) (HCC)  Syncope, unspecified syncope type    Rx / DC Orders ED Discharge Orders      None         Early Glisson, MD 06/19/23 2014

## 2023-06-19 NOTE — ED Provider Triage Note (Signed)
 Emergency Medicine Provider Triage Evaluation Note  Diane Torres , a 83 y.o. female  was evaluated in triage.  Pt complains of nausea vomiting and diarrhea.  Patient started feeling sick 3 days ago, around lunchtime.  She states her pastor had been diagnosed with COVID.  She went to urgent care the next day had negative COVID testing but her daughter tested her for COVID at home yesterday and it came back positive.  She has not kept anything down in 2 days.  Overall feeling unwell.  She has history of spinal stenosis has not been able to take her meloxicam for pain.  Review of Systems  Positive: Nausea, vomiting, diarrhea, fatigue Negative: Fever  Physical Exam  BP (!) 116/51 (BP Location: Right Arm)   Pulse 82   Temp 98 F (36.7 C) (Oral)   Resp 18   Ht 4\' 11"  (1.499 m)   Wt 63 kg   SpO2 97%   BMI 28.05 kg/m  Gen:   Awake, no distress   Resp:  Normal effort  MSK:   Moves extremities without difficulty  Other:    Medical Decision Making  Medically screening exam initiated at 6:55 PM.  Appropriate orders placed.  Diane Torres was informed that the remainder of the evaluation will be completed by another provider, this initial triage assessment does not replace that evaluation, and the importance of remaining in the ED until their evaluation is complete.     Aimee Houseman, [place] 06/19/23 1857

## 2023-06-19 NOTE — ED Triage Notes (Signed)
 Pt reports n/v/d. Tested positive for covid at home. Is dehydrated

## 2023-06-20 ENCOUNTER — Observation Stay (HOSPITAL_COMMUNITY)

## 2023-06-20 DIAGNOSIS — K219 Gastro-esophageal reflux disease without esophagitis: Secondary | ICD-10-CM | POA: Diagnosis present

## 2023-06-20 DIAGNOSIS — Z833 Family history of diabetes mellitus: Secondary | ICD-10-CM | POA: Diagnosis not present

## 2023-06-20 DIAGNOSIS — Z91030 Bee allergy status: Secondary | ICD-10-CM | POA: Diagnosis not present

## 2023-06-20 DIAGNOSIS — I129 Hypertensive chronic kidney disease with stage 1 through stage 4 chronic kidney disease, or unspecified chronic kidney disease: Secondary | ICD-10-CM | POA: Diagnosis present

## 2023-06-20 DIAGNOSIS — R197 Diarrhea, unspecified: Secondary | ICD-10-CM | POA: Diagnosis not present

## 2023-06-20 DIAGNOSIS — M549 Dorsalgia, unspecified: Secondary | ICD-10-CM | POA: Diagnosis present

## 2023-06-20 DIAGNOSIS — N1832 Chronic kidney disease, stage 3b: Secondary | ICD-10-CM | POA: Diagnosis present

## 2023-06-20 DIAGNOSIS — E890 Postprocedural hypothyroidism: Secondary | ICD-10-CM | POA: Diagnosis present

## 2023-06-20 DIAGNOSIS — Z860101 Personal history of adenomatous and serrated colon polyps: Secondary | ICD-10-CM | POA: Diagnosis not present

## 2023-06-20 DIAGNOSIS — U071 COVID-19: Principal | ICD-10-CM

## 2023-06-20 DIAGNOSIS — E861 Hypovolemia: Secondary | ICD-10-CM | POA: Diagnosis present

## 2023-06-20 DIAGNOSIS — E871 Hypo-osmolality and hyponatremia: Secondary | ICD-10-CM | POA: Diagnosis present

## 2023-06-20 DIAGNOSIS — A0839 Other viral enteritis: Secondary | ICD-10-CM | POA: Diagnosis present

## 2023-06-20 DIAGNOSIS — Z8249 Family history of ischemic heart disease and other diseases of the circulatory system: Secondary | ICD-10-CM | POA: Diagnosis not present

## 2023-06-20 DIAGNOSIS — Z66 Do not resuscitate: Secondary | ICD-10-CM | POA: Diagnosis present

## 2023-06-20 DIAGNOSIS — Z800 Family history of malignant neoplasm of digestive organs: Secondary | ICD-10-CM | POA: Diagnosis not present

## 2023-06-20 DIAGNOSIS — R1110 Vomiting, unspecified: Secondary | ICD-10-CM | POA: Diagnosis present

## 2023-06-20 DIAGNOSIS — I10 Essential (primary) hypertension: Secondary | ICD-10-CM

## 2023-06-20 DIAGNOSIS — F419 Anxiety disorder, unspecified: Secondary | ICD-10-CM | POA: Diagnosis present

## 2023-06-20 DIAGNOSIS — Z90710 Acquired absence of both cervix and uterus: Secondary | ICD-10-CM | POA: Diagnosis not present

## 2023-06-20 DIAGNOSIS — N179 Acute kidney failure, unspecified: Secondary | ICD-10-CM | POA: Insufficient documentation

## 2023-06-20 DIAGNOSIS — F32A Depression, unspecified: Secondary | ICD-10-CM | POA: Diagnosis present

## 2023-06-20 DIAGNOSIS — Z79899 Other long term (current) drug therapy: Secondary | ICD-10-CM | POA: Diagnosis not present

## 2023-06-20 DIAGNOSIS — E860 Dehydration: Secondary | ICD-10-CM | POA: Diagnosis present

## 2023-06-20 DIAGNOSIS — E782 Mixed hyperlipidemia: Secondary | ICD-10-CM | POA: Diagnosis present

## 2023-06-20 DIAGNOSIS — R112 Nausea with vomiting, unspecified: Secondary | ICD-10-CM | POA: Diagnosis present

## 2023-06-20 DIAGNOSIS — G8929 Other chronic pain: Secondary | ICD-10-CM | POA: Diagnosis present

## 2023-06-20 LAB — URINALYSIS, W/ REFLEX TO CULTURE (INFECTION SUSPECTED)
Bilirubin Urine: NEGATIVE
Glucose, UA: NEGATIVE mg/dL
Hgb urine dipstick: NEGATIVE
Ketones, ur: NEGATIVE mg/dL
Nitrite: NEGATIVE
Protein, ur: NEGATIVE mg/dL
Specific Gravity, Urine: 1.01 (ref 1.005–1.030)
WBC, UA: >50 WBC/hpf (ref 0–5)
pH: 6 (ref 5.0–8.0)

## 2023-06-20 LAB — COMPREHENSIVE METABOLIC PANEL WITH GFR
ALT: 27 U/L (ref 0–44)
AST: 31 U/L (ref 15–41)
Albumin: 3.1 g/dL — ABNORMAL LOW (ref 3.5–5.0)
Alkaline Phosphatase: 43 U/L (ref 38–126)
Anion gap: 9 (ref 5–15)
BUN: 23 mg/dL (ref 8–23)
CO2: 21 mmol/L — ABNORMAL LOW (ref 22–32)
Calcium: 8.5 mg/dL — ABNORMAL LOW (ref 8.9–10.3)
Chloride: 102 mmol/L (ref 98–111)
Creatinine, Ser: 1.17 mg/dL — ABNORMAL HIGH (ref 0.44–1.00)
GFR, Estimated: 46 mL/min — ABNORMAL LOW (ref >60)
Glucose, Bld: 80 mg/dL (ref 70–99)
Potassium: 4 mmol/L (ref 3.5–5.1)
Sodium: 132 mmol/L — ABNORMAL LOW (ref 135–145)
Total Bilirubin: 0.4 mg/dL (ref 0.0–1.2)
Total Protein: 6.1 g/dL — ABNORMAL LOW (ref 6.5–8.1)

## 2023-06-20 LAB — T4, FREE: Free T4: 0.77 ng/dL (ref 0.61–1.12)

## 2023-06-20 LAB — CBC
HCT: 29.3 % — ABNORMAL LOW (ref 36.0–46.0)
Hemoglobin: 9.3 g/dL — ABNORMAL LOW (ref 12.0–15.0)
MCH: 30.9 pg (ref 26.0–34.0)
MCHC: 31.7 g/dL (ref 30.0–36.0)
MCV: 97.3 fL (ref 80.0–100.0)
Platelets: 248 10*3/uL (ref 150–400)
RBC: 3.01 MIL/uL — ABNORMAL LOW (ref 3.87–5.11)
RDW: 14 % (ref 11.5–15.5)
WBC: 4.1 10*3/uL (ref 4.0–10.5)
nRBC: 0 % (ref 0.0–0.2)

## 2023-06-20 LAB — FOLATE: Folate: >40 ng/mL (ref >5.9)

## 2023-06-20 LAB — CULTURE, GROUP A STREP (THRC)

## 2023-06-20 LAB — TSH: TSH: 3.11 u[IU]/mL (ref 0.350–4.500)

## 2023-06-20 LAB — VITAMIN B12: Vitamin B-12: 396 pg/mL (ref 180–914)

## 2023-06-20 MED ORDER — SODIUM CHLORIDE 0.9 % IV SOLN: INTRAVENOUS | Status: DC

## 2023-06-20 NOTE — Plan of Care (Signed)
  Problem: Education: Goal: Knowledge of risk factors and measures for prevention of condition will improve Outcome: Progressing   Problem: Education: Goal: Knowledge of General Education information will improve Description: Including pain rating scale, medication(s)/side effects and non-pharmacologic comfort measures 06/20/2023 0619 by Celester Colace, RN Outcome: Progressing 06/20/2023 0619 by Celester Colace, RN Outcome: Progressing

## 2023-06-20 NOTE — Care Management Obs Status (Signed)
 MEDICARE OBSERVATION STATUS NOTIFICATION   Patient Details  Name: Diane Torres MRN: [id number] Date of Birth: 1940-12-27   Medicare Observation Status Notification Given:   Yes    Lynda Sands, RN 06/20/2023, 8:18 AM

## 2023-06-20 NOTE — Progress Notes (Signed)
   06/20/23 1101  TOC Brief Assessment  Insurance and Status Reviewed  Home environment has been reviewed Single Family Residence  Prior level of function: Independent  Prior/Current Home Services No current home services  Social Drivers of Health Review SDOH reviewed no interventions necessary  Transition of care needs no transition of care needs at this time   Transition of Care Department [facility]) has reviewed patient, and no TOC needs have been identified at this time. We will continue to monitor patient advancement through interdisciplinary progression rounds. If new patient transition needs arise, please place a TOC consult.

## 2023-06-20 NOTE — Hospital Course (Addendum)
 83 year old female with a history of hypertension, chronic back pain, hypothyroidism, GERD, hyperlipidemia presenting with 4-day history of sore throat, chest congestion, coughing, and runny nose.  The patient has also had 2 days of nausea, vomiting, and diarrhea.  She went to urgent care and COVID-19 test was negative.  Unfortunately, her symptoms continue to worsen and she was intolerant to food or liquids.  As result she presented for further evaluation and treatment.  She denies any hematochezia or melena.  There is no hemoptysis.  She denies any fevers, chills, headache, neck pain. In the ED, the patient was afebrile and hemodynamically stable with oxygen  saturation 95% room air.  WBC 4.9, hemoglobin 11.9, platelets 340.  Sodium 126, potassium 4.4, bicarbonate 21, serum creatinine 1.77.  AST 40, ALT 31, alk phosphatase 58, total bilirubin 0.7.  COVID-19 PCR was positive.  The patient was started on IV fluids and admitted for further evaluation treatment of her vomiting and diarrhea in the setting of COVID-19.

## 2023-06-20 NOTE — Progress Notes (Signed)
 PROGRESS NOTE  LAKEENA DOWNIE ZOX:[id number] DOB: [birth year] DOA: 06/19/2023 PCP: Omie Bickers, MD  Brief History:  83 year old female with a history of hypertension, chronic back pain, hypothyroidism, GERD, hyperlipidemia presenting with 4-day history of sore throat, chest congestion, coughing, and runny nose.  The patient has also had 2 days of nausea, vomiting, and diarrhea.  She went to urgent care and COVID-19 test was negative.  Unfortunately, her symptoms continue to worsen and she was intolerant to food or liquids.  As result she presented for further evaluation and treatment.  She denies any hematochezia or melena.  There is no hemoptysis.  She denies any fevers, chills, headache, neck pain. In the ED, the patient was afebrile and hemodynamically stable with oxygen  saturation 95% room air.  WBC 4.9, hemoglobin 11.9, platelets 340.  Sodium 126, potassium 4.4, bicarbonate 21, serum creatinine 1.77.  AST 40, ALT 31, alk phosphatase 58, total bilirubin 0.7.  COVID-19 PCR was positive.  The patient was started on IV fluids and admitted for further evaluation treatment of her vomiting and diarrhea in the setting of COVID-19.   Assessment/Plan: COVID-19 infection -Manifested primarily by GI symptoms with vomiting and diarrhea - Stable on room air without increase WOB -obtain CXR  Intractable vomiting and diarrhea - Gradually improving -Secondary COVID-19 infection - Continue IV fluids another 24 hours - Gradually advance diet as tolerated - Start pantoprazole  twice daily - Stool pathogen panel  Acute on chronic renal failure--CKD 3b - Baseline creatinine 1.2-1.3 - Presented with serum creatinine 1.77 - Secondary to volume depletion - Continue IV fluids>> improving  Hyponatremia - Presented with sodium 126 - Secondary to poor solute intake and volume depletion - Continue IV fluids>> improving  Essential hypertension - Holding amlodipine  and losartan  and monitor  BPs  Anxiety/depression - Continue Lexapro   Mixed hyperlipidemia - Continue statin          Family Communication:   daughter at bedside 5/17  Consultants:  none  Code Status:   DNR  DVT Prophylaxis:  Draper Lovenox    Procedures: As Listed in Progress Note Above  Antibiotics: None       Subjective: Patient states her vomiting is improving but still little nauseous.  She has not had a bowel movement since coming to the medical floor.  She denies any fevers, chills, headache, chest pain, shortness breath, hematochezia, melena.  She denies any abdominal pain.  Objective: Vitals:   06/19/23 2113 06/20/23 0150 06/20/23 0602 06/20/23 0719  BP: (!) 146/68 (!) 137/48 (!) 119/50 (!) 134/53  Pulse: 76 62 65 (!) 59  Resp: 17 16 16    Temp: 97.9 F (36.6 C) 98.1 F (36.7 C) 97.6 F (36.4 C) 98.1 F (36.7 C)  TempSrc: Oral Oral Oral Oral  SpO2: 95% 96% 94% 93%  Weight:      Height:        Intake/Output Summary (Last 24 hours) at 06/20/2023 1025 Last data filed at 06/20/2023 0620 Gross per 24 hour  Intake 1728.84 ml  Output 1300 ml  Net 428.84 ml   Weight change:  Exam:  General:  Pt is alert, follows commands appropriately, not in acute distress HEENT: No icterus, No thrush, No neck mass, Pillow/AT Cardiovascular: RRR, S1/S2, no rubs, no gallops Respiratory: Bibasilar crackles.  No wheezing.  Air movement Abdomen: Soft/+BS, non tender, non distended, no guarding Extremities: Non pitting edema, No lymphangitis, No petechiae, No rashes, no synovitis   Data Reviewed:  I have personally reviewed following labs and imaging studies Basic Metabolic Panel: Recent Labs  Lab 06/19/23 1910 06/20/23 0411  NA 126* 132*  K 4.4 4.0  CL 94* 102  CO2 21* 21*  GLUCOSE 108* 80  BUN 32* 23  CREATININE 1.77* 1.17*  CALCIUM 9.5 8.5*   Liver Function Tests: Recent Labs  Lab 06/19/23 1910 06/20/23 0411  AST 40 31  ALT 31 27  ALKPHOS 58 43  BILITOT 0.7 0.4  PROT 8.4*  6.1*  ALBUMIN 4.4 3.1*   Recent Labs  Lab 06/19/23 1910  LIPASE 55*   No results for input(s): "AMMONIA" in the last 168 hours. Coagulation Profile: No results for input(s): "INR", "PROTIME" in the last 168 hours. CBC: Recent Labs  Lab 06/19/23 1910 06/20/23 0411  WBC 4.9 4.1  NEUTROABS 3.1  --   HGB 11.9* 9.3*  HCT 37.3 29.3*  MCV 96.1 97.3  PLT 340 248   Cardiac Enzymes: No results for input(s): "CKTOTAL", "CKMB", "CKMBINDEX", "TROPONINI" in the last 168 hours. BNP: Invalid input(s): "POCBNP" CBG: No results for input(s): "GLUCAP" in the last 168 hours. HbA1C: No results for input(s): "HGBA1C" in the last 72 hours. Urine analysis:    Component Value Date/Time   COLORURINE STRAW (A) 06/05/2021 1305   APPEARANCEUR Clear 12/23/2021 1342   LABSPEC 1.019 06/05/2021 1305   PHURINE 6.0 06/05/2021 1305   GLUCOSEU Negative 12/23/2021 1342   HGBUR SMALL (A) 06/05/2021 1305   BILIRUBINUR Negative 12/23/2021 1342   KETONESUR 20 (A) 06/05/2021 1305   PROTEINUR Negative 12/23/2021 1342   PROTEINUR NEGATIVE 06/05/2021 1305   UROBILINOGEN 0.2 09/07/2006 1322   NITRITE Negative 12/23/2021 1342   NITRITE NEGATIVE 06/05/2021 1305   LEUKOCYTESUR 1+ (A) 12/23/2021 1342   LEUKOCYTESUR MODERATE (A) 06/05/2021 1305   Sepsis Labs: @LABRCNTIP (procalcitonin:4,lacticidven:4) ) Recent Results (from the past 240 hours)  Culture, group A strep     Status: None (Preliminary result)   Collection Time: 06/17/23 11:33 AM   Specimen: Throat  Result Value Ref Range Status   Specimen Description   Final    THROAT Performed at [facility], [address] [place], [place] [address]    Special Requests   Final    NONE Performed at [facility], [address], [place], [place] [address]    Culture   Final    CULTURE REINCUBATED FOR BETTER GROWTH Performed at [facility], [address] [place], [place] [address]    Report Status PENDING  Incomplete  Resp panel by RT-PCR (RSV,  Flu A&B, Covid) Anterior Nasal Swab     Status: Abnormal   Collection Time: 06/19/23  6:05 PM   Specimen: Anterior Nasal Swab  Result Value Ref Range Status   SARS Coronavirus 2 by RT PCR POSITIVE (A) NEGATIVE Final    Comment: (NOTE) SARS-CoV-2 target nucleic acids are DETECTED.  The SARS-CoV-2 RNA is generally detectable in upper respiratory specimens during the acute phase of infection. Positive results are indicative of the presence of the identified virus, but do not rule out bacterial infection or co-infection with other pathogens not detected by the test. Clinical correlation with patient history and other diagnostic information is necessary to determine patient infection status. The expected result is Negative.  Fact Sheet for Patients: BloggerCourse.com  Fact Sheet for Healthcare Providers: SeriousBroker.it  This test is not yet approved or cleared by the [place]  FDA and  has been authorized for detection and/or diagnosis of SARS-CoV-2 by FDA under  an Emergency Use Authorization (EUA).  This EUA will remain in effect (meaning this test can be used) for the duration of  the COVID-19 declaration under Section 564(b)(1) of the A ct, 21 U.S.C. section 360bbb-3(b)(1), unless the authorization is terminated or revoked sooner.     Influenza A by PCR NEGATIVE NEGATIVE Final   Influenza B by PCR NEGATIVE NEGATIVE Final    Comment: (NOTE) The Xpert Xpress SARS-CoV-2/FLU/RSV plus assay is intended as an aid in the diagnosis of influenza from Nasopharyngeal swab specimens and should not be used as a sole basis for treatment. Nasal washings and aspirates are unacceptable for Xpert Xpress SARS-CoV-2/FLU/RSV testing.  Fact Sheet for Patients: BloggerCourse.com  Fact Sheet for Healthcare Providers: SeriousBroker.it  This test is not yet approved or cleared by the [place] FDA and has been authorized for detection and/or diagnosis of SARS-CoV-2 by FDA under an Emergency Use Authorization (EUA). This EUA will remain in effect (meaning this test can be used) for the duration of the COVID-19 declaration under Section 564(b)(1) of the Act, 21 U.S.C. section 360bbb-3(b)(1), unless the authorization is terminated or revoked.     Resp Syncytial Virus by PCR NEGATIVE NEGATIVE Final    Comment: (NOTE) Fact Sheet for Patients: BloggerCourse.com  Fact Sheet for Healthcare Providers: SeriousBroker.it  This test is not yet approved or cleared by the [place]  FDA and has been authorized for detection and/or diagnosis of SARS-CoV-2 by FDA under an Emergency Use Authorization (EUA). This EUA will remain in effect (meaning this test can be used) for the duration of the COVID-19 declaration under Section 564(b)(1) of the Act, 21 U.S.C. section 360bbb-3(b)(1), unless the authorization is terminated or revoked.  Performed at [facility], [address], [place], [place] [address]      Scheduled Meds:  enoxaparin  (LOVENOX ) injection  30 mg Subcutaneous Q24H   escitalopram   5 mg Oral QODAY   folic acid   1 mg Oral Daily   pantoprazole   40 mg Oral Daily   simvastatin   40 mg Oral QHS   thiamine   100 mg Oral Daily   Continuous Infusions:  lactated ringers  75 mL/hr at 06/20/23 1020    Procedures/Studies: No results found.  Demaris Fillers, DO  [facility]  If 7PM-7AM, please contact night-coverage [email or web address] Password [email or web address] 06/20/2023, 10:25 AM   LOS: 0 days

## 2023-06-21 DIAGNOSIS — E871 Hypo-osmolality and hyponatremia: Secondary | ICD-10-CM | POA: Diagnosis not present

## 2023-06-21 DIAGNOSIS — E860 Dehydration: Principal | ICD-10-CM

## 2023-06-21 DIAGNOSIS — N179 Acute kidney failure, unspecified: Secondary | ICD-10-CM

## 2023-06-21 DIAGNOSIS — R197 Diarrhea, unspecified: Secondary | ICD-10-CM | POA: Diagnosis not present

## 2023-06-21 LAB — BASIC METABOLIC PANEL WITH GFR
Anion gap: 7 (ref 5–15)
BUN: 15 mg/dL (ref 8–23)
CO2: 21 mmol/L — ABNORMAL LOW (ref 22–32)
Calcium: 8 mg/dL — ABNORMAL LOW (ref 8.9–10.3)
Chloride: 102 mmol/L (ref 98–111)
Creatinine, Ser: 1.1 mg/dL — ABNORMAL HIGH (ref 0.44–1.00)
GFR, Estimated: 50 mL/min — ABNORMAL LOW (ref >60)
Glucose, Bld: 76 mg/dL (ref 70–99)
Potassium: 4.2 mmol/L (ref 3.5–5.1)
Sodium: 130 mmol/L — ABNORMAL LOW (ref 135–145)

## 2023-06-21 LAB — MAGNESIUM: Magnesium: 1.9 mg/dL (ref 1.7–2.4)

## 2023-06-21 MED ORDER — FLUCONAZOLE 150 MG PO TABS: 150.0000 mg | ORAL_TABLET | Freq: Once | ORAL | 0 refills | Status: AC

## 2023-06-21 MED ORDER — CEFDINIR 300 MG PO CAPS: 300.0000 mg | ORAL_CAPSULE | Freq: Two times a day (BID) | ORAL | 0 refills | Status: DC

## 2023-06-21 NOTE — Discharge Summary (Signed)
 Physician Discharge Summary   Patient: Diane Torres MRN: [id number] DOB: 10/24/1940  Admit date:     06/19/2023  Discharge date: 06/21/23  Discharge Physician: Myrtie Atkinson Aarilyn Dye   PCP: Omie Bickers, MD   Recommendations at discharge:   Please follow up with primary care provider within 1-2 weeks  Please repeat BMP and CBC in one week    Hospital Course: 83 year old female with a history of hypertension, chronic back pain, hypothyroidism, GERD, hyperlipidemia presenting with 4-day history of sore throat, chest congestion, coughing, and runny nose.  The patient has also had 2 days of nausea, vomiting, and diarrhea.  She went to urgent care and COVID-19 test was negative.  Unfortunately, her symptoms continue to worsen and she was intolerant to food or liquids.  As result she presented for further evaluation and treatment.  She denies any hematochezia or melena.  There is no hemoptysis.  She denies any fevers, chills, headache, neck pain. In the ED, the patient was afebrile and hemodynamically stable with oxygen  saturation 95% room air.  WBC 4.9, hemoglobin 11.9, platelets 340.  Sodium 126, potassium 4.4, bicarbonate 21, serum creatinine 1.77.  AST 40, ALT 31, alk phosphatase 58, total bilirubin 0.7.  COVID-19 PCR was positive.  The patient was started on IV fluids and admitted for further evaluation treatment of her vomiting and diarrhea in the setting of COVID-19.  Assessment and Plan: COVID-19 infection -Manifested primarily by GI symptoms with vomiting and diarrhea - Stable on room air without increase WOB -5/16 CXR--no infiltrates or edema   Intractable vomiting and diarrhea - Gradually improving -Secondary COVID-19 infection - received IVF during hospitalization - Gradually advance diet as tolerated - Start pantoprazole  twice daily - Stool pathogen panel--no diarrhea to collect -5/17-5/18>>tolerating diet, diarrhea improved   Acute on chronic renal failure--CKD 3b - Baseline creatinine  1.1-1.3 - Presented with serum creatinine 1.77 - Secondary to volume depletion - Continue IV fluids>> improving -serum creatinine 1.10 on day of dc   Hyponatremia - Presented with sodium 126 - Secondary to poor solute intake and volume depletion - Continue IV fluids>> improving   Essential hypertension - Holding amlodipine  and losartan  and monitor BPs - restart amlodipine  - will not restart losartan  - follow up with outpatient provider   Anxiety/depression - Continue Lexapro    Mixed hyperlipidemia - Continue statin      Consultants: none Procedures performed: none  Disposition: Home Diet recommendation:  Regular diet DISCHARGE MEDICATION: Allergies as of 06/21/2023       Reactions   Bee Venom Anaphylaxis        Medication List     STOP taking these medications    losartan  100 MG tablet Commonly known as: COZAAR        TAKE these medications    acetaminophen  500 MG tablet Commonly known as: TYLENOL  Take 1 tablet (500 mg total) by mouth every 6 (six) hours as needed. What changed: reasons to take this   amLODipine  5 MG tablet Commonly known as: NORVASC  Take 5 mg by mouth at bedtime.   cefdinir  300 MG capsule Commonly known as: OMNICEF  Take 1 capsule (300 mg total) by mouth 2 (two) times daily.   escitalopram  5 MG tablet Commonly known as: LEXAPRO  Take 5 mg by mouth every other day.   fluconazole  150 MG tablet Commonly known as: DIFLUCAN  Take 1 tablet (150 mg total) by mouth once for 1 dose.   guaiFENesin  100 MG/5ML liquid Commonly known as: ROBITUSSIN Take 10 mLs by mouth every  6 (six) hours as needed for cough or to loosen phlegm.   loratadine  10 MG tablet Commonly known as: CLARITIN  Take 10 mg by mouth daily.   magic mouthwash (nystatin , hydrocortisone, diphenhydrAMINE , lidocaine ) suspension Swish and swallow 5 mLs 4 (four) times daily as needed for mouth pain.   meloxicam 7.5 MG tablet Commonly known as: MOBIC Take 7.5 mg by mouth  in the morning and at bedtime.   pantoprazole  40 MG tablet Commonly known as: PROTONIX  Take 40 mg by mouth daily.   simvastatin  40 MG tablet Commonly known as: ZOCOR  Take 1 tablet (40 mg total) by mouth at bedtime.        Discharge Exam: Filed Weights   06/19/23 1803  Weight: 63 kg   HEENT:  [place]/AT, No thrush, no icterus CV:  RRR, no rub, no S3, no S4 Lung:  bibasilar crackles. No wheeze Abd:  soft/+BS, NT Ext:  Nonpitting edema, no lymphangitis, no synovitis, no rash   Condition at discharge: stable  The results of significant diagnostics from this hospitalization (including imaging, microbiology, ancillary and laboratory) are listed below for reference.   Imaging Studies: DG CHEST PORT 1 VIEW Result Date: 06/20/2023 CLINICAL DATA:  COVID-19.  Vomiting and diarrhea. EXAM: PORTABLE CHEST 1 VIEW COMPARISON:  04/09/2021 FINDINGS: Unchanged elevation of right hemidiaphragm. Stable heart size and mediastinal contours. No confluent opacity, pleural effusion, pneumothorax or pulmonary edema. No acute osseous abnormalities are seen. IMPRESSION: No acute chest findings. Unchanged elevation of right hemidiaphragm. Electronically Signed   By: Chadwick Colonel M.D.   On: 06/20/2023 13:07    Microbiology: Results for orders placed or performed during the hospital encounter of 06/19/23  Resp panel by RT-PCR (RSV, Flu A&B, Covid) Anterior Nasal Swab     Status: Abnormal   Collection Time: 06/19/23  6:05 PM   Specimen: Anterior Nasal Swab  Result Value Ref Range Status   SARS Coronavirus 2 by RT PCR POSITIVE (A) NEGATIVE Final    Comment: (NOTE) SARS-CoV-2 target nucleic acids are DETECTED.  The SARS-CoV-2 RNA is generally detectable in upper respiratory specimens during the acute phase of infection. Positive results are indicative of the presence of the identified virus, but do not rule out bacterial infection or co-infection with other pathogens not detected by the test. Clinical  correlation with patient history and other diagnostic information is necessary to determine patient infection status. The expected result is Negative.  Fact Sheet for Patients: BloggerCourse.com  Fact Sheet for Healthcare Providers: SeriousBroker.it  This test is not yet approved or cleared by the [place]  FDA and  has been authorized for detection and/or diagnosis of SARS-CoV-2 by FDA under an Emergency Use Authorization (EUA).  This EUA will remain in effect (meaning this test can be used) for the duration of  the COVID-19 declaration under Section 564(b)(1) of the A ct, 21 U.S.C. section 360bbb-3(b)(1), unless the authorization is terminated or revoked sooner.     Influenza A by PCR NEGATIVE NEGATIVE Final   Influenza B by PCR NEGATIVE NEGATIVE Final    Comment: (NOTE) The Xpert Xpress SARS-CoV-2/FLU/RSV plus assay is intended as an aid in the diagnosis of influenza from Nasopharyngeal swab specimens and should not be used as a sole basis for treatment. Nasal washings and aspirates are unacceptable for Xpert Xpress SARS-CoV-2/FLU/RSV testing.  Fact Sheet for Patients: BloggerCourse.com  Fact Sheet for Healthcare Providers: SeriousBroker.it  This test is not yet approved or cleared by the [place]  FDA and has been authorized for detection  and/or diagnosis of SARS-CoV-2 by FDA under an Emergency Use Authorization (EUA). This EUA will remain in effect (meaning this test can be used) for the duration of the COVID-19 declaration under Section 564(b)(1) of the Act, 21 U.S.C. section 360bbb-3(b)(1), unless the authorization is terminated or revoked.     Resp Syncytial Virus by PCR NEGATIVE NEGATIVE Final    Comment: (NOTE) Fact Sheet for Patients: BloggerCourse.com  Fact Sheet for Healthcare  Providers: SeriousBroker.it  This test is not yet approved or cleared by the [place]  FDA and has been authorized for detection and/or diagnosis of SARS-CoV-2 by FDA under an Emergency Use Authorization (EUA). This EUA will remain in effect (meaning this test can be used) for the duration of the COVID-19 declaration under Section 564(b)(1) of the Act, 21 U.S.C. section 360bbb-3(b)(1), unless the authorization is terminated or revoked.  Performed at [facility], [address], [place], [place] [address]     Labs: CBC: Recent Labs  Lab 06/19/23 1910 06/20/23 0411  WBC 4.9 4.1  NEUTROABS 3.1  --   HGB 11.9* 9.3*  HCT 37.3 29.3*  MCV 96.1 97.3  PLT 340 248   Basic Metabolic Panel: Recent Labs  Lab 06/19/23 1910 06/20/23 0411 06/21/23 0428  NA 126* 132* 130*  K 4.4 4.0 4.2  CL 94* 102 102  CO2 21* 21* 21*  GLUCOSE 108* 80 76  BUN 32* 23 15  CREATININE 1.77* 1.17* 1.10*  CALCIUM 9.5 8.5* 8.0*  MG  --   --  1.9   Liver Function Tests: Recent Labs  Lab 06/19/23 1910 06/20/23 0411  AST 40 31  ALT 31 27  ALKPHOS 58 43  BILITOT 0.7 0.4  PROT 8.4* 6.1*  ALBUMIN 4.4 3.1*   CBG: No results for input(s): "GLUCAP" in the last 168 hours.  Discharge time spent: greater than 30 minutes.  Signed: Demaris Fillers, MD [facility] 06/21/2023

## 2023-06-22 ENCOUNTER — Ambulatory Visit (HOSPITAL_COMMUNITY): Payer: Self-pay

## 2023-06-25 ENCOUNTER — Ambulatory Visit (HOSPITAL_COMMUNITY)

## 2023-08-06 ENCOUNTER — Ambulatory Visit: Payer: Self-pay | Admitting: Gastroenterology

## 2023-08-06 ENCOUNTER — Ambulatory Visit (HOSPITAL_COMMUNITY)
Admission: RE | Admit: 2023-08-06 | Discharge: 2023-08-06 | Disposition: A | Discharge status: Home / Self Care | Source: Ambulatory Visit | Attending: Gastroenterology | Admitting: Gastroenterology

## 2023-08-06 DIAGNOSIS — R6881 Early satiety: Secondary | ICD-10-CM | POA: Diagnosis present

## 2023-08-06 DIAGNOSIS — R195 Other fecal abnormalities: Secondary | ICD-10-CM | POA: Insufficient documentation

## 2023-08-06 DIAGNOSIS — R140 Abdominal distension (gaseous): Secondary | ICD-10-CM | POA: Insufficient documentation

## 2023-08-06 DIAGNOSIS — R630 Anorexia: Secondary | ICD-10-CM | POA: Diagnosis present

## 2023-08-06 DIAGNOSIS — K2100 Gastro-esophageal reflux disease with esophagitis, without bleeding: Secondary | ICD-10-CM | POA: Insufficient documentation

## 2023-08-06 DIAGNOSIS — R634 Abnormal weight loss: Secondary | ICD-10-CM | POA: Diagnosis present

## 2023-08-06 DIAGNOSIS — R109 Unspecified abdominal pain: Secondary | ICD-10-CM | POA: Diagnosis present

## 2023-08-06 MED ORDER — IOHEXOL 300 MG/ML  SOLN
100.0000 mL | Freq: Once | INTRAMUSCULAR | Status: AC | PRN
Start: 2023-08-06 — End: 2023-08-06
  Administered 2023-08-06: 100 mL via INTRAVENOUS

## 2023-08-06 MED ORDER — IOHEXOL 9 MG/ML PO SOLN
500.0000 mL | ORAL | Status: AC
  Administered 2023-08-06: 500 mL via ORAL

## 2023-08-06 MED ORDER — IOHEXOL 9 MG/ML PO SOLN
ORAL | Status: AC
  Filled 2023-08-06: qty 1000

## 2023-09-15 ENCOUNTER — Ambulatory Visit: Admitting: Gastroenterology

## 2023-09-15 ENCOUNTER — Encounter: Payer: Self-pay | Admitting: Gastroenterology

## 2023-09-15 VITALS — BP 137/71 | HR 69 | Temp 97.9°F | Ht 59.0 in | Wt 139.8 lb

## 2023-09-15 DIAGNOSIS — R109 Unspecified abdominal pain: Secondary | ICD-10-CM

## 2023-09-15 DIAGNOSIS — K529 Noninfective gastroenteritis and colitis, unspecified: Secondary | ICD-10-CM | POA: Diagnosis not present

## 2023-09-15 DIAGNOSIS — D649 Anemia, unspecified: Secondary | ICD-10-CM

## 2023-09-15 DIAGNOSIS — R1032 Left lower quadrant pain: Secondary | ICD-10-CM

## 2023-09-15 DIAGNOSIS — K219 Gastro-esophageal reflux disease without esophagitis: Secondary | ICD-10-CM

## 2023-09-15 MED ORDER — DICYCLOMINE HCL 10 MG PO CAPS: ORAL_CAPSULE | ORAL | 3 refills | Status: AC

## 2023-09-15 NOTE — Patient Instructions (Signed)
 Continue pantoprazole  40mg  daily before breakfast for acid reflux.  Add dicyclomine  10-20mg  up to twice daily for abdominal pain, loose stools. This can increase dry mouth, cause drowsiness. Monitor closely when first starting medication.   We will update your labs in 4-6 weeks. We will send you a reminder.  We will be in touch once labs are available. Please reach out sooner if you need anything.

## 2023-09-15 NOTE — Progress Notes (Signed)
 GI Office Note    Referring Provider: Shona Norleen PEDLAR, MD Primary Care Physician:  Shona Norleen PEDLAR, MD  Primary Gastroenterologist: Ozell Hollingshead, MD   Chief Complaint   Chief Complaint  Patient presents with   Follow-up    Follow up. No problems     History of Present Illness   Diane Torres is a 83 y.o. female presenting today for follow up. Last seen 06/2023. She has history of recurrent diverticulitis/colitis. FH Crohn's colitis, brother. Chronic poor appetite/early satiety, nausea sometimes vomiting aggravated by back pain (severel spinal stenosis).    Workup delayed after last office visit due to illness/hospitalization with COVID in May. She presents to discuss ongoing GI symptoms and anemia.   Discussed the use of AI scribe software for clinical note transcription with the patient, who gave verbal consent to proceed.   She has been experiencing persistent abdominal pain and bowel issues since at least 2015 however over this past year symptoms worse. Her bowel movements are generally loose, with occasional normal stools, and she reports that some foods seem to trigger her symptoms. She experiences frequent diarrhea, sometimes requiring Imodium  for relief, and occasionally has nocturnal bowel movements, approximately two to three times a month. No blood in her stool, but she reports passing mucus. Recently tomatoes coming in and she has had increased GI symptoms with consumption. Reflux is doing ok. Appetite fair. No vomiting.  CT A/P with contrast in 08/2023 with colonic diverticulosis. Nothing to explain her symptoms. Most recent colonoscopy in 2023, see below. Remote EGD in 2018.   She also has a history of mild anemia, with hemoglobin levels of 10.8 in July and 9.3 in May.B12 and folate normal. Iron studies not done.  Previously took dicyclomine , which she found somewhat helpful for her symptoms.  She mentions a family history of irritable bowel syndrome, as her daughter has  similar symptoms, although she has been told she does not have IBS. Her brother has Crohn's colitis.       Prior Data   Labs done 7/2 with PCP showed Hgb of 10.8 (up from 9.3 recently). LFTs were normal. She has chronic anemia with normal B12/folate. Iron studies not done recently.   CT abdomen pelvis with contrast July 2025: IMPRESSION: 1. No acute intra-abdominal or pelvic pathology. 2. Colonic diverticulosis. No bowel obstruction. Normal appendix. 3.  Aortic Atherosclerosis (ICD10-I70.0).  Colonoscopy 08/2021:  -scattered diverticulosis -6mm polyp removed (inflammatory)   EGD 03/2016: -LA Grade A esophagitis -dilation of esophagus  Wt Readings from Last 9 Encounters:  09/15/23 139 lb 12.8 oz (63.4 kg)  06/19/23 138 lb 14.2 oz (63 kg)  06/05/23 141 lb (64 kg)  01/21/23 143 lb (64.9 kg)  01/08/23 143 lb 4.8 oz (65 kg)  10/08/22 144 lb 12.8 oz (65.7 kg)  06/16/22 147 lb (66.7 kg)  03/11/22 151 lb (68.5 kg)  08/21/21 148 lb (67.1 kg)     Medications   Current Outpatient Medications  Medication Sig Dispense Refill   acetaminophen  (TYLENOL ) 500 MG tablet Take 1 tablet (500 mg total) by mouth every 6 (six) hours as needed. (Patient taking differently: Take 500 mg by mouth every 6 (six) hours as needed for mild pain (pain score 1-3).) 30 tablet 0   amLODipine  (NORVASC ) 5 MG tablet Take 5 mg by mouth at bedtime.     escitalopram  (LEXAPRO ) 5 MG tablet Take 5 mg by mouth every other day.     loratadine  (CLARITIN ) 10 MG tablet  Take 10 mg by mouth daily.     meloxicam (MOBIC) 7.5 MG tablet Take 7.5 mg by mouth in the morning and at bedtime.     pantoprazole  (PROTONIX ) 40 MG tablet Take 40 mg by mouth daily.     simvastatin  (ZOCOR ) 40 MG tablet Take 1 tablet (40 mg total) by mouth at bedtime. 30 tablet 0   No current facility-administered medications for this visit.    Allergies   Allergies as of 09/15/2023 - Review Complete 09/15/2023  Allergen Reaction Noted   Bee venom  Anaphylaxis 03/18/2016         Review of Systems   General: Negative for anorexia, weight loss, fever, chills, fatigue, weakness. ENT: Negative for hoarseness, difficulty swallowing, nasal congestion. CV: Negative for chest pain, angina, palpitations, dyspnea on exertion, peripheral edema.  Respiratory: Negative for dyspnea at rest, dyspnea on exertion, cough, sputum, wheezing.  GI: See history of present illness. GU:  Negative for dysuria, hematuria, urinary incontinence, urinary frequency, nocturnal urination.  Endo: Negative for unusual weight change.     Physical Exam   BP 137/71 (BP Location: Right Arm, Patient Position: Sitting, Cuff Size: Normal)   Pulse 69   Temp 97.9 F (36.6 C) (Temporal)   Ht 4' 11 (1.499 m)   Wt 139 lb 12.8 oz (63.4 kg)   BMI 28.24 kg/m    General: Well-nourished, well-developed in no acute distress.  Eyes: No icterus. Mouth: Oropharyngeal mucosa moist and pink   Abdomen: Bowel sounds are normal,  nondistended, no hepatosplenomegaly or masses,  no abdominal bruits or hernia , no rebound or guarding. Mild llq tenderness. Rectal: not performed Extremities: No lower extremity edema. No clubbing or deformities. Neuro: Alert and oriented x 4   Skin: Warm and dry, no jaundice.   Psych: Alert and cooperative, normal mood and affect.  Labs   August 05, 2023: White blood cell count 9, hemoglobin 10.8, hematocrit 33.2, MCV 98, platelets 359,000, glucose 84, creatinine 1.14 high, albumin 4.4, total bilirubin 0.3, alk phos 52, AST 18, ALT 11, A1c 5.2.  Labs from 06/23/2023: B12 396, folate 40, hemoglobin 9.3, hematocrit 29.3.  Imaging Studies   No results found.  Assessment/Plan:      Chronic diarrhea with abdominal pain Chronic diarrhea with abdominal pain, worsened over the past year. Symptoms dating back per patient at least 10 years. She wonders if she has IBS like her daughter. She experiences frequent loose stools, occasional nocturnal bowel  movements, and left-sided abdominal pain. No blood in stools reported. Previous use of dicyclomine  provided some relief. Differential includes IBS and diverticulosis-related symptoms, microscopic colitis cannot be excluded (but this would not necessarily explain her abdominal pain), food intolerance, medication related (including nsaid use). She has history of recurrent diverticulitis, none seen on most recent CT. ?referred back pain -dicyclomine  10-20mg  up to twice daily for abdominal pain, loose stool.  Discussed potential side effects of dicyclomine , including drowsiness, dry mouth, and reduced sweating.  -could Consideration of Viberzi if dicyclomine  is ineffective.   Mild anemia Mild anemia with hemoglobin of 10.8 g/dL, improved from 9.3 g/dL in May. Anemia may be related to previous hospitalization and hemodilution in setting of dehydration. No recent iron studies available.  - Order repeat blood work in 4-6 weeks, including iron studies, to assess for iron deficiency.    GERD controlled -continue pantoprazole  40mg  daily before breakfast.       Sonny RAMAN. Ezzard, MHS, PA-C [facility]

## 2023-09-21 ENCOUNTER — Other Ambulatory Visit (HOSPITAL_COMMUNITY): Payer: Self-pay | Admitting: Neurosurgery

## 2023-09-21 DIAGNOSIS — M48062 Spinal stenosis, lumbar region with neurogenic claudication: Secondary | ICD-10-CM

## 2023-09-25 ENCOUNTER — Ambulatory Visit (HOSPITAL_COMMUNITY)
Admission: RE | Admit: 2023-09-25 | Discharge: 2023-09-25 | Disposition: A | Discharge status: Home / Self Care | Source: Ambulatory Visit | Attending: Neurosurgery | Admitting: Neurosurgery

## 2023-09-25 DIAGNOSIS — M48062 Spinal stenosis, lumbar region with neurogenic claudication: Secondary | ICD-10-CM | POA: Diagnosis present

## 2023-09-30 ENCOUNTER — Other Ambulatory Visit: Payer: Self-pay

## 2023-09-30 DIAGNOSIS — D649 Anemia, unspecified: Secondary | ICD-10-CM

## 2023-10-01 ENCOUNTER — Encounter: Payer: Self-pay | Admitting: Gastroenterology

## 2023-10-27 ENCOUNTER — Encounter: Payer: Self-pay | Admitting: Gastroenterology

## 2023-10-27 ENCOUNTER — Ambulatory Visit: Admitting: Gastroenterology

## 2023-10-27 VITALS — BP 136/74 | HR 79 | Temp 97.6°F | Ht 59.0 in | Wt 143.4 lb

## 2023-10-27 DIAGNOSIS — D649 Anemia, unspecified: Secondary | ICD-10-CM

## 2023-10-27 DIAGNOSIS — K219 Gastro-esophageal reflux disease without esophagitis: Secondary | ICD-10-CM | POA: Diagnosis not present

## 2023-10-27 DIAGNOSIS — K529 Noninfective gastroenteritis and colitis, unspecified: Secondary | ICD-10-CM | POA: Diagnosis not present

## 2023-10-27 DIAGNOSIS — R197 Diarrhea, unspecified: Secondary | ICD-10-CM

## 2023-10-27 NOTE — Patient Instructions (Addendum)
 Continue dicyclomine  1-2 capsule every morning for abdominal pain, loose stools. May take up to two capsules twice daily.  Continue pantoprazole  40mg  once daily.  Complete labs in 02/2024 when you see PCP. Please let us  know when labs are complete so I can look for the results.   Monitor for worsening abdominal pain, weight loss, blood in stool. Please reach out if any concerns.   We will plan to see you back in one year unless you have change in your symptoms.

## 2023-10-27 NOTE — Progress Notes (Signed)
 GI Office Note    Referring Provider: Shona Norleen PEDLAR, MD Primary Care Physician:  Shona Norleen PEDLAR, MD  Primary Gastroenterologist: Ozell Hollingshead, MD   Chief Complaint   Chief Complaint  Patient presents with   Follow-up    Follow up. Needs to talk about the results of blood work     History of Present Illness   Diane Torres is a 83 y.o. female presenting today for follow up. Last seen 09/2023. She has history of recurrent diverticulitis/colitis. FH Crohn's colitis, brother. Chronic poor appetite/early satiety, nausea sometimes vomiting aggravated by back pain (severel spinal stenosis). Seen last month for chronic diarrhea with abdominal pain, mild anemia, gerd.   She was having frequent loose stools, occasional nocturnal bowel movements, and left-sided abdominal pain. No blood in stools reported. CT unremarkable as below. She was started on dicyclomine  10-20mg  up to twice daily. Noted to have mild anemia, Hgb 9.3 at end of May hospitalization, possible hemodilution. Hgb 08/2023 was 10.8 (normal 11.1), Hct slightly low. B12 and folate normal. Recent labs with Hgb 11.1, Hct now normal, iron studies normal.    Discussed the use of AI scribe software for clinical note transcription with the patient, who gave verbal consent to proceed.   She takes dicyclomine  every morning. Stool frequency improved. No nocturnal stools. Stools solid. Diarrhea occurs only with trigger foods like chocolate. No skipped days without bowel movements. No blood in stool, crampiness, or abdominal pain are present. She reports her acid reflux is okay. No n/v. Has poor appetite chronically. Maintaining her weight.   She saw surgeon regarding back but has decided not to pursue surgery due to risk and length of recovery. She is on meloxicam 7.5mg  BID for pain management. She remains on pantoprazole  40mg  daily.     Prior Data   Wt Readings from Last 3 Encounters:  10/27/23 143 lb 6.4 oz (65 kg)  09/15/23 139 lb 12.8  oz (63.4 kg)  06/19/23 138 lb 14.2 oz (63 kg)   Labs September 2025: Iron 52, iron sat 15%, TIBC 337, ferritin 51, white blood cell count 8.7, hemoglobin 10.8 (normal 11.1), hematocrit 34.8, MCV 95, platelets 357  Labs done 7/2 with PCP showed Hgb of 10.8 (up from 9.3 recently), hematocrit 33.2 low, MCV 98 high. LFTs were normal. She has chronic anemia with normal B12/folate. Iron studies not done recently.    CT abdomen pelvis with contrast July 2025: IMPRESSION: 1. No acute intra-abdominal or pelvic pathology. 2. Colonic diverticulosis. No bowel obstruction. Normal appendix. 3.  Aortic Atherosclerosis (ICD10-I70.0).   Colonoscopy 08/2021:  -scattered diverticulosis -6mm polyp removed (inflammatory)   EGD 03/2016: -LA Grade A esophagitis -dilation of esophagus    Medications   Current Outpatient Medications  Medication Sig Dispense Refill   acetaminophen  (TYLENOL ) 500 MG tablet Take 1 tablet (500 mg total) by mouth every 6 (six) hours as needed. 30 tablet 0   amLODipine  (NORVASC ) 5 MG tablet Take 5 mg by mouth at bedtime.     dicyclomine  (BENTYL ) 10 MG capsule Take 1-2 capsule up to twice daily for abdominal pain, loose stools. May cause drowsiness, dry mouth, constipation. 120 capsule 3   escitalopram  (LEXAPRO ) 5 MG tablet Take 5 mg by mouth every other day.     loratadine  (CLARITIN ) 10 MG tablet Take 10 mg by mouth daily.     meloxicam (MOBIC) 7.5 MG tablet Take 7.5 mg by mouth in the morning and at bedtime.     pantoprazole  (  PROTONIX ) 40 MG tablet Take 40 mg by mouth daily.     simvastatin  (ZOCOR ) 40 MG tablet Take 1 tablet (40 mg total) by mouth at bedtime. 30 tablet 0   No current facility-administered medications for this visit.    Allergies   Allergies as of 10/27/2023 - Review Complete 10/27/2023  Allergen Reaction Noted   Bee venom Anaphylaxis 03/18/2016      Review of Systems   General: Negative for  weight loss, fever, chills, fatigue, weakness. See hpi ENT:  Negative for hoarseness, difficulty swallowing , nasal congestion. CV: Negative for chest pain, angina, palpitations, dyspnea on exertion, peripheral edema.  Respiratory: Negative for dyspnea at rest, dyspnea on exertion, cough, sputum, wheezing.  GI: See history of present illness. GU:  Negative for dysuria, hematuria, urinary incontinence, urinary frequency, nocturnal urination.  Endo: Negative for unusual weight change.     Physical Exam   BP 136/74 (BP Location: Right Arm, Patient Position: Sitting, Cuff Size: Normal)   Pulse 79   Temp 97.6 F (36.4 C) (Temporal)   Ht 4' 11 (1.499 m)   Wt 143 lb 6.4 oz (65 kg)   BMI 28.96 kg/m    General: Well-nourished, well-developed in no acute distress.  Eyes: No icterus. Mouth: Oropharyngeal mucosa moist and pink   Abdomen: Bowel sounds are normal, nondistended, no hepatosplenomegaly or masses,  no abdominal bruits or hernia , no rebound or guarding. Minimal tenderness over left lower quadrant/left hip Rectal: not performed Extremities: No lower extremity edema. No clubbing or deformities. Neuro: Alert and oriented x 4   Skin: Warm and dry, no jaundice.   Psych: Alert and cooperative, normal mood and affect.  Labs   See above  Imaging Studies   No results found.  Assessment/Plan:    Mild Anemia without iron deficiency Chronic anemia with stable hemoglobin at 10.8 g/dL and normal iron studies. First noted in May, dropped from 11.9 to 9.3 while in hospital, suspect hemodilution. No evidence of gastrointestinal bleeding or iron deficiency. Previous colonoscopy and endoscopy in 2023. B12, folate, iron/ferritin all normal. Hgb near normal, Hct now normal. - patient wants to monitor labs, hold off on any potential work up unless decline in Hgb or overt GI bleeding noted.  -she is on chronic mobic for her back, no plans for pursuing surgery. She may consider reducing to once a day to see if she can tolerate her chronic back pain.  Encouraged her to stay on PPI for gastric protection.  -next labs in 02/2024 when she sees PCP. Plan for CBC, iron/tibc/ferritin.    Chronic diarrhea  Doing much better on dicyclomine . Regular bowel movements without diarrhea, except with known food triggers. No abdominal pain or cramping. - Continue dicyclomine  10-20mg  every morning, may take up to twice daily - Avoid known food triggers.  Gastroesophageal reflux disease  Condition well-managed with current treatment. No recent exacerbations or complications. - Continue current management with pantoprazole .       Sonny RAMAN. Ezzard, MHS, PA-C [facility]

## 2024-02-04 ENCOUNTER — Encounter: Payer: Self-pay | Admitting: Gastroenterology

## 2024-02-20 ENCOUNTER — Ambulatory Visit

## 2024-03-08 ENCOUNTER — Emergency Department (HOSPITAL_COMMUNITY)

## 2024-03-08 ENCOUNTER — Observation Stay (HOSPITAL_COMMUNITY)
Admission: EM | Admit: 2024-03-08 | Discharge: 2024-03-10 | Disposition: A | Discharge status: Skilled Nursing Facility | Source: Home / Self Care | Attending: Internal Medicine | Admitting: Internal Medicine

## 2024-03-08 ENCOUNTER — Encounter (HOSPITAL_COMMUNITY): Payer: Self-pay | Admitting: Internal Medicine

## 2024-03-08 ENCOUNTER — Other Ambulatory Visit: Payer: Self-pay

## 2024-03-08 DIAGNOSIS — I10 Essential (primary) hypertension: Secondary | ICD-10-CM | POA: Diagnosis present

## 2024-03-08 DIAGNOSIS — E43 Unspecified severe protein-calorie malnutrition: Secondary | ICD-10-CM | POA: Insufficient documentation

## 2024-03-08 DIAGNOSIS — W19XXXA Unspecified fall, initial encounter: Principal | ICD-10-CM | POA: Diagnosis present

## 2024-03-08 DIAGNOSIS — E039 Hypothyroidism, unspecified: Secondary | ICD-10-CM | POA: Diagnosis present

## 2024-03-08 DIAGNOSIS — R531 Weakness: Secondary | ICD-10-CM

## 2024-03-08 DIAGNOSIS — R197 Diarrhea, unspecified: Secondary | ICD-10-CM | POA: Diagnosis present

## 2024-03-08 DIAGNOSIS — M25511 Pain in right shoulder: Secondary | ICD-10-CM

## 2024-03-08 LAB — URINALYSIS, W/ REFLEX TO CULTURE (INFECTION SUSPECTED)
Bacteria, UA: NONE SEEN
Bilirubin Urine: NEGATIVE
Glucose, UA: NEGATIVE mg/dL
Hgb urine dipstick: NEGATIVE
Ketones, ur: NEGATIVE mg/dL
Leukocytes,Ua: NEGATIVE
Nitrite: NEGATIVE
Protein, ur: NEGATIVE mg/dL
Specific Gravity, Urine: 1.015 (ref 1.005–1.030)
pH: 6 (ref 5.0–8.0)

## 2024-03-08 LAB — COMPREHENSIVE METABOLIC PANEL WITH GFR
ALT: 15 U/L (ref 0–44)
AST: 24 U/L (ref 15–41)
Albumin: 3.7 g/dL (ref 3.5–5.0)
Alkaline Phosphatase: 61 U/L (ref 38–126)
Anion gap: 15 (ref 5–15)
BUN: 24 mg/dL — ABNORMAL HIGH (ref 8–23)
CO2: 22 mmol/L (ref 22–32)
Calcium: 9.9 mg/dL (ref 8.9–10.3)
Chloride: 96 mmol/L — ABNORMAL LOW (ref 98–111)
Creatinine, Ser: 0.84 mg/dL (ref 0.44–1.00)
GFR, Estimated: >60 mL/min
Glucose, Bld: 100 mg/dL — ABNORMAL HIGH (ref 70–99)
Potassium: 5 mmol/L (ref 3.5–5.1)
Sodium: 133 mmol/L — ABNORMAL LOW (ref 135–145)
Total Bilirubin: 0.4 mg/dL (ref 0.0–1.2)
Total Protein: 7.7 g/dL (ref 6.5–8.1)

## 2024-03-08 LAB — CK: Total CK: 94 U/L (ref 38–234)

## 2024-03-08 LAB — CBC WITH DIFFERENTIAL/PLATELET
Abs Immature Granulocytes: 0.1 10*3/uL — ABNORMAL HIGH (ref 0.00–0.07)
Basophils Absolute: 0.1 10*3/uL (ref 0.0–0.1)
Basophils Relative: 1 %
Eosinophils Absolute: 0.1 10*3/uL (ref 0.0–0.5)
Eosinophils Relative: 0 %
HCT: 32.1 % — ABNORMAL LOW (ref 36.0–46.0)
Hemoglobin: 10.4 g/dL — ABNORMAL LOW (ref 12.0–15.0)
Immature Granulocytes: 1 %
Lymphocytes Relative: 7 %
Lymphs Abs: 1.2 10*3/uL (ref 0.7–4.0)
MCH: 29.8 pg (ref 26.0–34.0)
MCHC: 32.4 g/dL (ref 30.0–36.0)
MCV: 92 fL (ref 80.0–100.0)
Monocytes Absolute: 1.1 10*3/uL — ABNORMAL HIGH (ref 0.1–1.0)
Monocytes Relative: 7 %
Neutro Abs: 14.7 10*3/uL — ABNORMAL HIGH (ref 1.7–7.7)
Neutrophils Relative %: 84 %
Platelets: 587 10*3/uL — ABNORMAL HIGH (ref 150–400)
RBC: 3.49 MIL/uL — ABNORMAL LOW (ref 3.87–5.11)
RDW: 13.7 % (ref 11.5–15.5)
WBC: 17.2 10*3/uL — ABNORMAL HIGH (ref 4.0–10.5)
nRBC: 0 % (ref 0.0–0.2)

## 2024-03-08 LAB — MAGNESIUM: Magnesium: 2.4 mg/dL (ref 1.7–2.4)

## 2024-03-08 LAB — CBG MONITORING, ED: Glucose-Capillary: 99 mg/dL (ref 70–99)

## 2024-03-08 LAB — TROPONIN T, HIGH SENSITIVITY
Troponin T High Sensitivity: 26 ng/L — ABNORMAL HIGH (ref 0–19)
Troponin T High Sensitivity: 29 ng/L — ABNORMAL HIGH (ref 0–19)

## 2024-03-08 MED ORDER — LIDOCAINE 5 % EX PTCH
1.0000 | MEDICATED_PATCH | Freq: Once | CUTANEOUS | Status: AC
  Administered 2024-03-08: 1 via TRANSDERMAL
  Filled 2024-03-08: qty 1

## 2024-03-08 MED ORDER — TRAZODONE HCL 50 MG PO TABS
50.0000 mg | ORAL_TABLET | Freq: Once | ORAL | Status: AC
  Administered 2024-03-08: 50 mg via ORAL
  Filled 2024-03-08: qty 1

## 2024-03-08 MED ORDER — MORPHINE SULFATE (PF) 2 MG/ML IV SOLN
2.0000 mg | Freq: Once | INTRAVENOUS | Status: AC
  Administered 2024-03-08: 2 mg via INTRAVENOUS
  Filled 2024-03-08: qty 1

## 2024-03-08 MED ORDER — MORPHINE SULFATE (PF) 2 MG/ML IV SOLN
2.0000 mg | Freq: Once | INTRAVENOUS | Status: AC | PRN
  Administered 2024-03-08: 2 mg via INTRAVENOUS
  Filled 2024-03-08: qty 1

## 2024-03-08 MED ORDER — MORPHINE SULFATE (PF) 4 MG/ML IV SOLN
4.0000 mg | Freq: Once | INTRAVENOUS | Status: AC
  Administered 2024-03-08: 4 mg via INTRAVENOUS
  Filled 2024-03-08: qty 1

## 2024-03-08 MED ORDER — ACETAMINOPHEN 650 MG RE SUPP: 650.0000 mg | Freq: Four times a day (QID) | RECTAL | Status: DC | PRN

## 2024-03-08 MED ORDER — SODIUM CHLORIDE 0.9 % IV BOLUS: 250.0000 mL | Freq: Once | INTRAVENOUS | Status: DC

## 2024-03-08 MED ORDER — ONDANSETRON HCL 4 MG/2ML IJ SOLN
4.0000 mg | Freq: Once | INTRAMUSCULAR | Status: AC
  Administered 2024-03-08: 4 mg via INTRAVENOUS
  Filled 2024-03-08: qty 2

## 2024-03-08 MED ORDER — ENOXAPARIN SODIUM 40 MG/0.4ML IJ SOSY
40.0000 mg | PREFILLED_SYRINGE | INTRAMUSCULAR | Status: DC
  Administered 2024-03-08 – 2024-03-09 (×2): 40 mg via SUBCUTANEOUS
  Filled 2024-03-08 (×2): qty 0.4

## 2024-03-08 MED ORDER — ACETAMINOPHEN 325 MG PO TABS: 650.0000 mg | ORAL_TABLET | Freq: Four times a day (QID) | ORAL | Status: DC | PRN

## 2024-03-08 MED ORDER — MORPHINE SULFATE (PF) 4 MG/ML IV SOLN: 4.0000 mg | Freq: Once | INTRAVENOUS | Status: DC

## 2024-03-08 MED ORDER — SODIUM CHLORIDE 0.9 % IV SOLN: INTRAVENOUS | Status: AC

## 2024-03-08 MED ORDER — TRAMADOL HCL 50 MG PO TABS
50.0000 mg | ORAL_TABLET | Freq: Four times a day (QID) | ORAL | Status: DC | PRN
  Administered 2024-03-08: 50 mg via ORAL
  Filled 2024-03-08: qty 1

## 2024-03-08 MED ORDER — POLYETHYLENE GLYCOL 3350 17 G PO PACK: 17.0000 g | PACK | Freq: Every day | ORAL | Status: DC | PRN

## 2024-03-08 NOTE — ED Triage Notes (Signed)
 Pt c/o shoulders and arms hurting from attempting to get up.

## 2024-03-08 NOTE — ED Notes (Addendum)
 Pt is RA.

## 2024-03-08 NOTE — ED Triage Notes (Signed)
 Pt arrived [facility] from sliding off her bed at 2am. Pt was unable to get up so she crawled to the bathroom hoping to pull herself up. Pt was unable and laid prone until family found her today. Pt c/o right knee pain. Pt lives alone.

## 2024-03-09 DIAGNOSIS — W19XXXA Unspecified fall, initial encounter: Secondary | ICD-10-CM | POA: Diagnosis present

## 2024-03-09 DIAGNOSIS — R531 Weakness: Secondary | ICD-10-CM | POA: Diagnosis not present

## 2024-03-09 LAB — BASIC METABOLIC PANEL WITH GFR
Anion gap: 14 (ref 5–15)
BUN: 26 mg/dL — ABNORMAL HIGH (ref 8–23)
CO2: 21 mmol/L — ABNORMAL LOW (ref 22–32)
Calcium: 8.9 mg/dL (ref 8.9–10.3)
Chloride: 96 mmol/L — ABNORMAL LOW (ref 98–111)
Creatinine, Ser: 1.04 mg/dL — ABNORMAL HIGH (ref 0.44–1.00)
GFR, Estimated: 53 mL/min — ABNORMAL LOW
Glucose, Bld: 92 mg/dL (ref 70–99)
Potassium: 4.7 mmol/L (ref 3.5–5.1)
Sodium: 131 mmol/L — ABNORMAL LOW (ref 135–145)

## 2024-03-09 LAB — CBC
HCT: 29.3 % — ABNORMAL LOW (ref 36.0–46.0)
Hemoglobin: 9.3 g/dL — ABNORMAL LOW (ref 12.0–15.0)
MCH: 29.8 pg (ref 26.0–34.0)
MCHC: 31.7 g/dL (ref 30.0–36.0)
MCV: 93.9 fL (ref 80.0–100.0)
Platelets: 487 10*3/uL — ABNORMAL HIGH (ref 150–400)
RBC: 3.12 MIL/uL — ABNORMAL LOW (ref 3.87–5.11)
RDW: 13.9 % (ref 11.5–15.5)
WBC: 11.7 10*3/uL — ABNORMAL HIGH (ref 4.0–10.5)
nRBC: 0 % (ref 0.0–0.2)

## 2024-03-09 MED ORDER — MORPHINE SULFATE (PF) 2 MG/ML IV SOLN
2.0000 mg | Freq: Once | INTRAVENOUS | Status: AC | PRN
  Administered 2024-03-09: 2 mg via INTRAVENOUS
  Filled 2024-03-09: qty 1

## 2024-03-09 MED ORDER — ACETAMINOPHEN 500 MG PO TABS
1000.0000 mg | ORAL_TABLET | Freq: Three times a day (TID) | ORAL | Status: DC
  Administered 2024-03-09 – 2024-03-10 (×4): 1000 mg via ORAL
  Filled 2024-03-09 (×4): qty 2

## 2024-03-09 MED ORDER — LIDOCAINE 5 % EX PTCH
3.0000 | MEDICATED_PATCH | Freq: Once | CUTANEOUS | Status: AC
  Administered 2024-03-09: 3 via TRANSDERMAL
  Filled 2024-03-09: qty 3

## 2024-03-09 MED ORDER — MORPHINE SULFATE (PF) 2 MG/ML IV SOLN
1.0000 mg | INTRAVENOUS | Status: DC | PRN
  Administered 2024-03-09: 1 mg via INTRAVENOUS
  Filled 2024-03-09: qty 1

## 2024-03-09 MED ORDER — ESCITALOPRAM OXALATE 10 MG PO TABS
5.0000 mg | ORAL_TABLET | ORAL | Status: DC
  Administered 2024-03-09: 5 mg via ORAL
  Filled 2024-03-09: qty 1

## 2024-03-09 MED ORDER — GABAPENTIN 100 MG PO CAPS
100.0000 mg | ORAL_CAPSULE | Freq: Two times a day (BID) | ORAL | Status: DC
  Administered 2024-03-09 – 2024-03-10 (×3): 100 mg via ORAL
  Filled 2024-03-09 (×3): qty 1

## 2024-03-09 NOTE — [place] FL2 (Signed)
 " [facility]  MEDICAID FL2 LEVEL OF CARE FORM     IDENTIFICATION  Patient Name: Diane Torres Birthdate: 1940-10-15 Sex: female Admission Date (Current Location): 03/08/2024  [facility] and [place] Number:  [name] and Address:  [facility],  [address] [place] [address]      Provider Number: [id number]  Attending Physician Name and Address:  Diane Harlene PENNER, DO  Relative Name and Phone Number:  Diane Torres  Daughter, Emergency Contact  [phone]    Current Level of Care: Hospital Recommended Level of Care: Skilled Nursing Facility Prior Approval Number:    Date Approved/Denied:   PASRR Number: [id number]  Discharge Plan: SNF    Current Diagnoses: Patient Active Problem List   Diagnosis Date Noted   Fall 03/09/2024   Anemia 09/15/2023   Dehydration 06/21/2023   Acute renal failure superimposed on stage 3b chronic kidney disease (HCC) 06/20/2023   Intractable vomiting 06/20/2023   COVID-19 virus infection 06/19/2023   Vaginal yeast infection 04/22/2021   Unspecified protein-calorie malnutrition 04/17/2021   Abnormal breath sounds 04/17/2021   Chronic non-seasonal allergic rhinitis 04/16/2021   Metabolic acidosis, normal anion gap (NAG) 04/09/2021   Renal cyst, right 04/09/2021   Hyponatremia 04/09/2021   Normocytic anemia 04/08/2021   Generalized weakness 04/08/2021   Sepsis (HCC) 04/07/2021   Acute respiratory failure with hypoxia (HCC) 04/07/2021   Hyperkalemia 04/07/2021   AKI (acute kidney injury) 04/06/2021   Pituitary mass 10/28/2020   Intracranial mass 10/28/2020   Macrocytic anemia 10/28/2020   Chronic back pain 10/28/2020   Essential hypertension 10/28/2020   Syncope 10/27/2020   Diarrhea 12/02/2018   Diverticulosis 12/02/2018   Diverticulitis 06/30/2017   Rectal bleeding 06/30/2017   Leukocytosis 06/30/2017   CKD (chronic kidney disease) stage 3, GFR 30-59 ml/min (HCC) 06/30/2017   LLQ pain 12/23/2016   GERD  (gastroesophageal reflux disease) 06/16/2016   Gastroesophageal reflux disease with esophagitis 06/16/2016   Precordial pain 03/10/2013   Impaired fasting glucose 03/09/2013   Mixed hyperlipidemia 03/09/2013   Hypothyroidism 03/09/2013    Orientation RESPIRATION BLADDER Height & Weight     Self, Time, Situation, Place  Normal Incontinent Weight: 141 lb 5 oz (64.1 kg) Height:  4' 11 (149.9 cm)  BEHAVIORAL SYMPTOMS/MOOD NEUROLOGICAL BOWEL NUTRITION STATUS      Continent Diet (regular)  AMBULATORY STATUS COMMUNICATION OF NEEDS Skin   Extensive Assist Verbally Normal                       Personal Care Assistance Level of Assistance  Bathing, Feeding, Dressing Bathing Assistance: Maximum assistance Feeding assistance: Limited assistance Dressing Assistance: Maximum assistance     Functional Limitations Info  Sight, Hearing, Speech Sight Info: Impaired (reading glasses) Hearing Info: Adequate Speech Info: Adequate    SPECIAL CARE FACTORS FREQUENCY  PT (By licensed PT), OT (By licensed OT)     PT Frequency: 5x/wk OT Frequency: 5x/wk            Contractures Contractures Info: Not present    Additional Factors Info  Code Status, Allergies, Psychotropic Code Status Info: DNR Allergies Info: Bee Venom Psychotropic Info: Lexapro          Current Medications (03/09/2024):  This is the current hospital active medication list Current Facility-Administered Medications  Medication Dose Route Frequency Provider Last Rate Last Admin   0.9 %  sodium chloride  infusion   Intravenous Continuous Emokpae, Ejiroghene E, MD 75 mL/hr at 03/08/24 1737 New Bag at  03/08/24 1737   acetaminophen  (TYLENOL ) tablet 1,000 mg  1,000 mg Oral TID Vann, Jessica U, DO   1,000 mg at 03/09/24 1033   enoxaparin  (LOVENOX ) injection 40 mg  40 mg Subcutaneous Q24H Emokpae, Ejiroghene E, MD   40 mg at 03/08/24 2020   escitalopram  (LEXAPRO ) tablet 5 mg  5 mg Oral QODAY Vann, Jessica U, DO   5 mg at  03/09/24 1136   gabapentin  (NEURONTIN ) capsule 100 mg  100 mg Oral BID Vann, Jessica U, DO   100 mg at 03/09/24 1136   lidocaine  (LIDODERM ) 5 % 3 patch  3 patch Transdermal Once Vann, Jessica U, DO   3 patch at 03/09/24 1032   morphine  (PF) 2 MG/ML injection 1 mg  1 mg Intravenous Q4H PRN Vann, Jessica U, DO   1 mg at 03/09/24 1032   morphine  (PF) 4 MG/ML injection 4 mg  4 mg Intravenous Once Naasz, Hayley N, MD       polyethylene glycol (MIRALAX  / GLYCOLAX ) packet 17 g  17 g Oral Daily PRN Emokpae, Ejiroghene E, MD       traMADol  (ULTRAM ) tablet 50 mg  50 mg Oral Q6H PRN Emokpae, Ejiroghene E, MD   50 mg at 03/08/24 1802     Discharge Medications: Please see discharge summary for a list of discharge medications.  Relevant Imaging Results:  Relevant Lab Results:   Additional Information SSN: [id number]  Diane DELENA Bigness, LCSW     "

## 2024-03-09 NOTE — TOC Initial Note (Addendum)
 Transition of Care [facility]) - Initial/Assessment Note    Patient Details  Name: Diane Torres MRN: [id number] Date of Birth: 04-05-1940  Transition of [facility] CM/SW Contact:    Hoy DELENA Bigness, LCSW Phone Number: 03/09/2024, 1:41 PM  Clinical Narrative:                 Pt from home alone. Pt has cane she uses at baseline. Pt reluctantly agreeable to SNF placement at [facility]. Referral has been sent and currently awaiting bed offer.   ADDENDUM: Pt/family accepted bed offer for STR at [facility]. Insurance auth being requested.  Expected Discharge Plan: Skilled Nursing Facility Barriers to Discharge: Continued Medical Work up, SNF Pending bed offer   Patient Goals and CMS Choice Patient states their goals for this hospitalization and ongoing recovery are:: To get rehab CMS Medicare.gov Compare Post Acute Care list provided to:: Patient Choice offered to / list presented to : Patient [place] ownership interest in [facility] Med.provided to:: Patient    Expected Discharge Plan and Services In-house Referral: Clinical Social Work Discharge Planning Services: NA Post Acute Care Choice: Skilled Nursing Facility Living arrangements for the past 2 months: Single Family Home                 DME Arranged: N/A DME Agency: NA                  Prior Living Arrangements/Services Living arrangements for the past 2 months: Single Family Home Lives with:: Self Patient language and need for interpreter reviewed:: Yes Do you feel safe going back to the place where you live?: Yes      Need for Family Participation in Patient Care: No (Comment) Care giver support system in place?: No (comment) Current home services: DME [facility]) Criminal Activity/Legal Involvement Pertinent to Current Situation/Hospitalization: No - Comment as needed  Activities of Daily Living   ADL Screening (condition at time of admission) Independently performs ADLs?: Yes (appropriate for developmental age) Is the  patient deaf or have difficulty hearing?: No Does the patient have difficulty seeing, even when wearing glasses/contacts?: No Does the patient have difficulty concentrating, remembering, or making decisions?: No  Permission Sought/Granted Permission sought to share information with : Facility Medical Sales Representative, Family Supports Permission granted to share information with : Yes, Verbal Permission Granted  Share Information with NAME: Devere and Reena  Permission granted to share info w AGENCY: [facility]  Permission granted to share info w Relationship: Daughters     Emotional Assessment Appearance:: Appears stated age Attitude/Demeanor/Rapport: Engaged Affect (typically observed): Calm Orientation: : Oriented to Self, Oriented to Place, Oriented to  Time, Oriented to Situation Alcohol / Substance Use: Not Applicable Psych Involvement: No (comment)  Admission diagnosis:  Fall [W19.XXXA] Generalized weakness [R53.1] Fall in home, initial encounter [W19.XXXA, Y92.009] Acute pain of both shoulders [M25.511, M25.512] Patient Active Problem List   Diagnosis Date Noted   Fall 03/09/2024   Anemia 09/15/2023   Dehydration 06/21/2023   Acute renal failure superimposed on stage 3b chronic kidney disease (HCC) 06/20/2023   Intractable vomiting 06/20/2023   COVID-19 virus infection 06/19/2023   Vaginal yeast infection 04/22/2021   Unspecified protein-calorie malnutrition 04/17/2021   Abnormal breath sounds 04/17/2021   Chronic non-seasonal allergic rhinitis 04/16/2021   Metabolic acidosis, normal anion gap (NAG) 04/09/2021   Renal cyst, right 04/09/2021   Hyponatremia 04/09/2021   Normocytic anemia 04/08/2021   Generalized weakness 04/08/2021   Sepsis (HCC) 04/07/2021   Acute respiratory  failure with hypoxia (HCC) 04/07/2021   Hyperkalemia 04/07/2021   AKI (acute kidney injury) 04/06/2021   Pituitary mass 10/28/2020   Intracranial mass 10/28/2020   Macrocytic anemia 10/28/2020    Chronic back pain 10/28/2020   Essential hypertension 10/28/2020   Syncope 10/27/2020   Diarrhea 12/02/2018   Diverticulosis 12/02/2018   Diverticulitis 06/30/2017   Rectal bleeding 06/30/2017   Leukocytosis 06/30/2017   CKD (chronic kidney disease) stage 3, GFR 30-59 ml/min (HCC) 06/30/2017   LLQ pain 12/23/2016   GERD (gastroesophageal reflux disease) 06/16/2016   Gastroesophageal reflux disease with esophagitis 06/16/2016   Precordial pain 03/10/2013   Impaired fasting glucose 03/09/2013   Mixed hyperlipidemia 03/09/2013   Hypothyroidism 03/09/2013   PCP:  Shona Norleen PEDLAR, MD Pharmacy:   [facility] - [place], [place] - [address] [place] [address] Phone: [phone] Fax: [phone]  [facility] [phone] - [place],  - [address] AT [facility] [address] [place] [address] Phone: [phone] Fax: [phone]     Social Drivers of Health (SDOH) Social History: SDOH Screenings   Food Insecurity: No Food Insecurity (03/08/2024)  Housing: Low Risk (03/08/2024)  Transportation Needs: No Transportation Needs (03/08/2024)  Utilities: Not At Risk (03/08/2024)  Social Connections: Moderately Integrated (03/08/2024)  Tobacco Use: Low Risk (03/08/2024)   SDOH Interventions:     Readmission Risk Interventions     No data to display

## 2024-03-09 NOTE — Progress Notes (Signed)
 " PROGRESS NOTE    [name]  FMW:[id number] DOB: 1940/04/26 DOA: 03/08/2024 PCP: Shona Norleen PEDLAR, MD    Brief Narrative:   Diane Torres is a 84 y.o. female with medical history significant for diverticulosis, hypertension, hypothyroidism. Patient was brought to the ED by EMS after she was found by her daughter on the floor.  Patient was sleeping on the couch, and sometime between 12 and 3 AM she wanted to use the bathroom, she was unable to push herself off the couch to get up because of chronic pain to her shoulders, so she rolled off the couch onto the floor and crawled to the bathroom trying to use the toilet to get up.  She was still unable to get up, so she laid on the floor until her daughter found her this morning.    Assessment and Plan: Generalized weakness-likely component of dehydration from chronic poor oral intake, chronic intermittent diarrhea as well as medications  - Chest x-ray, UA without acute abnormality, bilateral shoulder x-ray, pelvic x-ray without fracture or gross abnormalities.  Chronic pain to bilateral shoulders limiting related to stand from sitting or lying position-takes meloxicam PRN and was started on gabapentin  recently for pain. -  N/s 75cc/hr x 1 day - PT evaluation, may need placement. - Resume gabapentin  but lower dose - Scheduled Tylenol    Hyponatremia - Chronic per family - Check urine osmolality and urine sodium -Daily labs  Leukocytosis  -Will check blood cultures - Urinalysis unremarkable - WBCs trending down   Prolonged QTc, 507.  Potassium 5, magnesium  2.4.   Hypothyroidism - Resume Synthroid -TSH normal in 2025   Hypertension - Resume Norvasc  5 mg   DVT prophylaxis: enoxaparin  (LOVENOX ) injection 40 mg Start: 03/08/24 2200    Code Status: Limited: Do not attempt resuscitation (DNR) -DNR-LIMITED -Do Not Intubate/DNI  Family Communication:   Disposition Plan:  Level of care: Telemetry Status is: Observation     Consultants:   none   Subjective: Family states patient has been complaining of shoulder pain and arm pain since she arrived  Objective: Vitals:   03/08/24 1615 03/08/24 1730 03/08/24 2002 03/08/24 2300  BP: (!) 113/43 118/66 (!) 132/48 (!) 132/48  Pulse:  77 73 74  Resp: 17 16 18 20   Temp:  98.1 F (36.7 C) 97.9 F (36.6 C) 98.2 F (36.8 C)  TempSrc:  Oral Oral Oral  SpO2:  93% 96% 94%  Weight:  64.1 kg    Height:  4' 11 (1.499 m)      Intake/Output Summary (Last 24 hours) at 03/09/2024 1104 Last data filed at 03/09/2024 1041 Gross per 24 hour  Intake 698.33 ml  Output 300 ml  Net 398.33 ml   Filed Weights   03/08/24 1133 03/08/24 1730  Weight: 63.5 kg 64.1 kg    Examination:   General: Appearance:     Overweight female in no acute distress     Lungs:     respirations unlabored  Heart:    Normal heart rate.    MS:   All extremities are intact.    Neurologic: Sleeping after getting pain medication       Data Reviewed: I have personally reviewed following labs and imaging studies  CBC: Recent Labs  Lab 03/08/24 1238 03/09/24 0455  WBC 17.2* 11.7*  NEUTROABS 14.7*  --   HGB 10.4* 9.3*  HCT 32.1* 29.3*  MCV 92.0 93.9  PLT 587* 487*   Basic Metabolic Panel: Recent Labs  Lab 03/08/24 1238 03/09/24 0455  NA 133* 131*  K 5.0 4.7  CL 96* 96*  CO2 22 21*  GLUCOSE 100* 92  BUN 24* 26*  CREATININE 0.84 1.04*  CALCIUM 9.9 8.9  MG 2.4  --    GFR: Estimated Creatinine Clearance: 32.8 mL/min (A) (by C-G formula based on SCr of 1.04 mg/dL (H)). Liver Function Tests: Recent Labs  Lab 03/08/24 1238  AST 24  ALT 15  ALKPHOS 61  BILITOT 0.4  PROT 7.7  ALBUMIN 3.7   No results for input(s): LIPASE, AMYLASE in the last 168 hours. No results for input(s): AMMONIA in the last 168 hours. Coagulation Profile: No results for input(s): INR, PROTIME in the last 168 hours. Cardiac Enzymes: Recent Labs  Lab 03/08/24 1238  CKTOTAL 94   BNP (last 3  results) No results for input(s): PROBNP in the last 8760 hours. HbA1C: No results for input(s): HGBA1C in the last 72 hours. CBG: Recent Labs  Lab 03/08/24 1226  GLUCAP 99   Lipid Profile: No results for input(s): CHOL, HDL, LDLCALC, TRIG, CHOLHDL, LDLDIRECT in the last 72 hours. Thyroid  Function Tests: No results for input(s): TSH, T4TOTAL, FREET4, T3FREE, THYROIDAB in the last 72 hours. Anemia Panel: No results for input(s): VITAMINB12, FOLATE, FERRITIN, TIBC, IRON, RETICCTPCT in the last 72 hours. Sepsis Labs: No results for input(s): PROCALCITON, LATICACIDVEN in the last 168 hours.  No results found for this or any previous visit (from the past 240 hours).       Radiology Studies: DG Shoulder Right Result Date: 03/08/2024 CLINICAL DATA:  Right shoulder pain. EXAM: RIGHT SHOULDER - 2+ VIEW COMPARISON:  None Available. FINDINGS: There is no evidence of fracture or dislocation. Mild degenerative changes are seen involving the right acromioclavicular joint. Soft tissues are unremarkable. IMPRESSION: Mild degenerative changes involving the right acromioclavicular joint. Electronically Signed   By: Suzen Dials M.D.   On: 03/08/2024 13:18   DG Shoulder Left Portable Result Date: 03/08/2024 CLINICAL DATA:  Left shoulder pain after fall EXAM: LEFT SHOULDER COMPARISON:  None Available. FINDINGS: There is no evidence of fracture or dislocation. There is no evidence of arthropathy or other focal bone abnormality. Soft tissues are unremarkable. IMPRESSION: Negative. Electronically Signed   By: Lynwood Landy Raddle M.D.   On: 03/08/2024 12:56   DG Pelvis Portable Result Date: 03/08/2024 CLINICAL DATA:  Fall EXAM: PORTABLE PELVIS 1-2 VIEWS COMPARISON:  August 07, 2020 FINDINGS: There is no evidence of pelvic fracture or diastasis. No pelvic bone lesions are seen. IMPRESSION: Negative. Electronically Signed   By: Lynwood Landy Raddle M.D.   On: 03/08/2024 12:54    DG Chest Portable 1 View Result Date: 03/08/2024 CLINICAL DATA:  Fall EXAM: PORTABLE CHEST 1 VIEW COMPARISON:  Jun 20, 2023 FINDINGS: Stable cardiomediastinal silhouette. Both lungs are clear. The visualized skeletal structures are unremarkable. IMPRESSION: No active disease. Electronically Signed   By: Lynwood Landy Raddle M.D.   On: 03/08/2024 12:53        Scheduled Meds:  acetaminophen   1,000 mg Oral TID   enoxaparin  (LOVENOX ) injection  40 mg Subcutaneous Q24H   escitalopram   5 mg Oral QODAY   gabapentin   100 mg Oral BID   lidocaine   1 patch Transdermal Once   lidocaine   3 patch Transdermal Once   morphine  (PF)  4 mg Intravenous Once   Continuous Infusions:  sodium chloride  75 mL/hr at 03/08/24 1737     LOS: 0 days    Time spent:  45 minutes spent on chart review, discussion with nursing staff, consultants, updating family and interview/physical exam; more than 50% of that time was spent in counseling and/or coordination of care.    Harlene RAYMOND Bowl, DO [facility] Available via Epic secure chat 7am-7pm After these hours, please refer to coverage provider listed on [email or web address] 03/09/2024, 11:04 AM   "

## 2024-03-09 NOTE — Progress Notes (Signed)
 Mobility Specialist Progress Note:    03/09/24 0900  Mobility  Activity Pivoted/transferred from chair to bed  Level of Assistance Maximum assist, patient does 25-49% (+2)  Assistive Device None  Distance Ambulated (ft) 2 ft  Range of Motion/Exercises Active;All extremities  Activity Response Tolerated well  Mobility Referral Yes  Mobility visit 1 Mobility  Mobility Specialist Start Time (ACUTE ONLY) 0900  Mobility Specialist Stop Time (ACUTE ONLY) [id number]  Mobility Specialist Time Calculation (min) (ACUTE ONLY) 17 min   Pt received in chair, requesting assistance to bed. Required MaxA +2 to stand and pivot with no AD. Tolerated well, generalized weakness. NT and family in room, all needs met.  Christofer Shen Mobility Specialist Please contact via Special Educational Needs Teacher or  Rehab office at [phone]

## 2024-03-09 NOTE — Evaluation (Signed)
 Physical Therapy Evaluation Patient Details Name: Diane Torres MRN: [id number] DOB: 1940-02-13 Today's Date: 03/09/2024  History of Present Illness  Diane Torres is a 84 y.o. female with medical history significant for diverticulosis, hypertension, hypothyroidism.  Patient was brought to the ED by EMS after she was found by her daughter on the floor.  Patient was sleeping on the couch, and sometime between 12 and 3 AM she wanted to use the bathroom, she was unable to push herself off the couch to get up because of chronic pain to her shoulders, so she rolled off the couch onto the floor and crawled to the bathroom trying to use the toilet to get up.  She was still unable to get up, so she laid on the floor until her daughter found her this morning.  She denies frequent falls.  Occasionally ambulates with a cane.  She lives alone.  She is currently complaining of persistent pain to her shoulders, neck.  She has chronic low back pain with radiation of pain down her right leg this is unchanged from baseline.  Patient also has chronic intermittent diarrhea, unchanged also.  Daughter reports poor oral intake.  No vomiting.  No abdominal pain.   Clinical Impression  Patient demonstrates slow labored movement for sitting up at bedside with c/o severe back pain and generalized pain allover, required repeated attempts before able to complete sit to stand and limited to a few slow labored unsteady side steps before having to sit due to BLE weakness with buckling of knees. Patient will benefit from continued skilled physical therapy in hospital and recommended venue below to increase strength, balance, endurance for safe ADLs and gait.          If plan is discharge home, recommend the following: A lot of help with bathing/dressing/bathroom;A lot of help with walking and/or transfers;Help with stairs or ramp for entrance;Assist for transportation;Assistance with cooking/housework   Can travel by private  vehicle   No    Equipment Recommendations None recommended by PT  Recommendations for Other Services       Functional Status Assessment Patient has had a recent decline in their functional status and demonstrates the ability to make significant improvements in function in a reasonable and predictable amount of time.     Precautions / Restrictions Precautions Precautions: Fall Recall of Precautions/Restrictions: Intact Restrictions Weight Bearing Restrictions Per Provider Order: No      Mobility  Bed Mobility Overal bed mobility: Needs Assistance Bed Mobility: Supine to Sit     Supine to sit: Mod assist, Max assist     General bed mobility comments: increased time, labored movement    Transfers Overall transfer level: Needs assistance Equipment used: Rolling walker (2 wheels) Transfers: Sit to/from Stand, Bed to chair/wheelchair/BSC Sit to Stand: Mod assist, Max assist   Step pivot transfers: Mod assist, Max assist       General transfer comment: required repeated attempts for completing sit to stands    Ambulation/Gait Ambulation/Gait assistance: Max assist Gait Distance (Feet): 3 Feet Assistive device: Rolling walker (2 wheels) Gait Pattern/deviations: Decreased step length - right, Decreased step length - left, Decreased stride length, Knees buckling Gait velocity: slow     General Gait Details: limited to a few a few slow labored side steps due to weakness with bucklling of knees  Stairs            Wheelchair Mobility     Tilt Bed    Modified Rankin (Stroke Patients  Only)       Balance Overall balance assessment: Needs assistance Sitting-balance support: No upper extremity supported Sitting balance-Leahy Scale: Fair Sitting balance - Comments: seated at EOB   Standing balance support: Reliant on assistive device for balance, During functional activity, Bilateral upper extremity supported Standing balance-Leahy Scale: Poor Standing  balance comment: using RW                             Pertinent Vitals/Pain Pain Assessment Pain Assessment: Faces Faces Pain Scale: Hurts whole lot Pain Location: back and generalized pain all over Pain Descriptors / Indicators: Aching, Discomfort, Sore, Grimacing Pain Intervention(s): Limited activity within patient's tolerance, Monitored during session, Repositioned    Home Living Family/patient expects to be discharged to:: Private residence Living Arrangements: Alone Available Help at Discharge: Family;Available 24 hours/day Type of Home: House Home Access: Stairs to enter   [employer] of Steps: 2   Home Layout: One level Home Equipment: Agricultural Consultant (2 wheels);Grab bars - tub/shower;Tub bench;BSC/3in1;Cane - single point      Prior Function Prior Level of Function : Needs assist       Physical Assist : Mobility (physical);ADLs (physical) Mobility (physical): Bed mobility;Transfers;Gait;Stairs   Mobility Comments: household and short distanced community ambulation using SPC ADLs Comments: Indpendent for household, assisted for community     Extremity/Trunk Assessment   Upper Extremity Assessment Upper Extremity Assessment: Generalized weakness    Lower Extremity Assessment Lower Extremity Assessment: Generalized weakness    Cervical / Trunk Assessment Cervical / Trunk Assessment: Kyphotic  Communication   Communication Communication: No apparent difficulties    Cognition Arousal: Alert Behavior During Therapy: WFL for tasks assessed/performed                             Following commands: Intact       Cueing Cueing Techniques: Verbal cues, Tactile cues     General Comments      Exercises     Assessment/Plan    PT Assessment Patient needs continued PT services  PT Problem List Decreased strength;Decreased activity tolerance;Decreased balance;Decreased mobility       PT Treatment Interventions DME  instruction;Gait training;Stair training;Functional mobility training;Therapeutic activities;Therapeutic exercise;Balance training;Patient/family education    PT Goals (Current goals can be found in the Care Plan section)  Acute Rehab PT Goals Patient Stated Goal: return home with family to assist PT Goal Formulation: With patient/family Time For Goal Achievement: 03/30/24 Potential to Achieve Goals: Good    Frequency Min 3X/week     Co-evaluation               AM-PAC PT 6 Clicks Mobility  Outcome Measure Help needed turning from your back to your side while in a flat bed without using bedrails?: A Lot Help needed moving from lying on your back to sitting on the side of a flat bed without using bedrails?: A Lot Help needed moving to and from a bed to a chair (including a wheelchair)?: A Lot Help needed standing up from a chair using your arms (e.g., wheelchair or bedside chair)?: A Lot Help needed to walk in hospital room?: A Lot Help needed climbing 3-5 steps with a railing? : Total 6 Click Score: 11    End of Session   Activity Tolerance: Patient tolerated treatment well;Patient limited by fatigue Patient left: in chair;with call bell/phone within reach;with family/visitor present Nurse Communication: Mobility  status PT Visit Diagnosis: Unsteadiness on feet (R26.81);Other abnormalities of gait and mobility (R26.89);Muscle weakness (generalized) (M62.81)    Time: 0822-0852 PT Time Calculation (min) (ACUTE ONLY): 30 min   Charges:   PT Evaluation $PT Eval Moderate Complexity: 1 Mod PT Treatments $Therapeutic Activity: 23-37 mins PT General Charges $$ ACUTE PT VISIT: 1 Visit         1:48 PM, 03/09/24 Lynwood Music, MPT Physical Therapist with [facility] 336 [phone] office [phone] mobile phone

## 2024-03-09 NOTE — Plan of Care (Signed)
" °  Problem: Acute Rehab PT Goals(only PT should resolve) Goal: Pt Will Go Supine/Side To Sit Outcome: Progressing Flowsheets (Taken 03/09/2024 1351) Pt will go Supine/Side to Sit:  with minimal assist  with moderate assist Goal: Patient Will Transfer Sit To/From Stand Outcome: Progressing Flowsheets (Taken 03/09/2024 1351) Patient will transfer sit to/from stand:  with minimal assist  with moderate assist Goal: Pt Will Transfer Bed To Chair/Chair To Bed Outcome: Progressing Flowsheets (Taken 03/09/2024 1351) Pt will Transfer Bed to Chair/Chair to Bed:  with min assist  with mod assist Goal: Pt Will Ambulate Outcome: Progressing Flowsheets (Taken 03/09/2024 1351) Pt will Ambulate:  25 feet  with minimal assist  with moderate assist  with rolling walker   1:52 PM, 03/09/24 Lynwood Music, MPT Physical Therapist with [facility] 336 [phone] office [phone] mobile phone  "

## 2024-03-10 DIAGNOSIS — R531 Weakness: Secondary | ICD-10-CM | POA: Diagnosis not present

## 2024-03-10 DIAGNOSIS — E43 Unspecified severe protein-calorie malnutrition: Secondary | ICD-10-CM | POA: Insufficient documentation

## 2024-03-10 LAB — BASIC METABOLIC PANEL WITH GFR
Anion gap: 8 (ref 5–15)
BUN: 20 mg/dL (ref 8–23)
CO2: 23 mmol/L (ref 22–32)
Calcium: 8.7 mg/dL — ABNORMAL LOW (ref 8.9–10.3)
Chloride: 101 mmol/L (ref 98–111)
Creatinine, Ser: 0.8 mg/dL (ref 0.44–1.00)
GFR, Estimated: >60 mL/min
Glucose, Bld: 103 mg/dL — ABNORMAL HIGH (ref 70–99)
Potassium: 4.3 mmol/L (ref 3.5–5.1)
Sodium: 132 mmol/L — ABNORMAL LOW (ref 135–145)

## 2024-03-10 LAB — CBC
HCT: 26.5 % — ABNORMAL LOW (ref 36.0–46.0)
Hemoglobin: 8.4 g/dL — ABNORMAL LOW (ref 12.0–15.0)
MCH: 29.7 pg (ref 26.0–34.0)
MCHC: 31.7 g/dL (ref 30.0–36.0)
MCV: 93.6 fL (ref 80.0–100.0)
Platelets: 394 10*3/uL (ref 150–400)
RBC: 2.83 MIL/uL — ABNORMAL LOW (ref 3.87–5.11)
RDW: 13.7 % (ref 11.5–15.5)
WBC: 10.5 10*3/uL (ref 4.0–10.5)
nRBC: 0 % (ref 0.0–0.2)

## 2024-03-10 MED ORDER — LIDOCAINE 5 % EX PTCH: 3.0000 | MEDICATED_PATCH | CUTANEOUS | Status: AC

## 2024-03-10 MED ORDER — TRAMADOL HCL 50 MG PO TABS: 50.0000 mg | ORAL_TABLET | Freq: Four times a day (QID) | ORAL | 0 refills | Status: DC | PRN

## 2024-03-10 MED ORDER — LIDOCAINE 5 % EX PTCH
3.0000 | MEDICATED_PATCH | CUTANEOUS | Status: DC
  Administered 2024-03-10: 3 via TRANSDERMAL
  Filled 2024-03-10: qty 3

## 2024-03-10 MED ORDER — ADULT MULTIVITAMIN LIQUID CH
15.0000 mL | Freq: Every day | ORAL | Status: DC
  Administered 2024-03-10: 15 mL via ORAL
  Filled 2024-03-10 (×2): qty 15

## 2024-03-10 MED ORDER — ACETAMINOPHEN 500 MG PO TABS: 1000.0000 mg | ORAL_TABLET | Freq: Three times a day (TID) | ORAL | Status: AC

## 2024-03-10 MED ORDER — POLYETHYLENE GLYCOL 3350 17 G PO PACK: 17.0000 g | PACK | Freq: Every day | ORAL | Status: AC | PRN

## 2024-03-10 MED ORDER — AMLODIPINE BESYLATE 5 MG PO TABS: 5.0000 mg | ORAL_TABLET | Freq: Every day | ORAL | Status: DC

## 2024-03-10 MED ORDER — ENSURE PLUS HIGH PROTEIN PO LIQD: 237.0000 mL | Freq: Two times a day (BID) | ORAL | Status: DC

## 2024-03-10 MED ORDER — ADULT MULTIVITAMIN LIQUID CH: 15.0000 mL | Freq: Every day | ORAL | Status: AC

## 2024-03-10 MED ORDER — TRAMADOL HCL 50 MG PO TABS: 50.0000 mg | ORAL_TABLET | Freq: Four times a day (QID) | ORAL | 0 refills | Status: AC | PRN

## 2024-03-10 MED ORDER — GABAPENTIN 100 MG PO CAPS: 100.0000 mg | ORAL_CAPSULE | Freq: Two times a day (BID) | ORAL | Status: AC

## 2024-03-10 MED ORDER — AMLODIPINE BESYLATE 5 MG PO TABS
5.0000 mg | ORAL_TABLET | Freq: Every day | ORAL | Status: DC
  Administered 2024-03-10: 5 mg via ORAL
  Filled 2024-03-10: qty 1

## 2024-03-10 MED ORDER — ENSURE PLUS HIGH PROTEIN PO LIQD: 237.0000 mL | Freq: Two times a day (BID) | ORAL | Status: AC

## 2024-03-10 NOTE — Progress Notes (Signed)
 Mobility Specialist Progress Note:    03/10/24 1147  Mobility  Activity Pivoted/transferred from chair to bed  Level of Assistance Minimal assist, patient does 75% or more (+2)  Assistive Device None  Distance Ambulated (ft) 3 ft  Range of Motion/Exercises Active;All extremities  Activity Response Tolerated well  Mobility Referral Yes  Mobility visit 1 Mobility  Mobility Specialist Start Time (ACUTE ONLY) 1147  Mobility Specialist Stop Time (ACUTE ONLY) 1203  Mobility Specialist Time Calculation (min) (ACUTE ONLY) 16 min   Pt received in chair, NT requesting assistance transferring to bed. Required MinA+2 to stand and transfer with no AD. Tolerated well, mobility has improved since session yesterday. Family and NT in room, all needs met.  Jourdain Guay Mobility Specialist Please contact via Special Educational Needs Teacher or  Rehab office at [phone]

## 2024-03-10 NOTE — Discharge Instructions (Signed)

## 2024-03-10 NOTE — Plan of Care (Signed)
  Problem: Education: Goal: Knowledge of General Education information will improve Description: Including pain rating scale, medication(s)/side effects and non-pharmacologic comfort measures Outcome: Progressing   Problem: Clinical Measurements: Goal: Ability to maintain clinical measurements within normal limits will improve Outcome: Progressing Goal: Will remain free from infection Outcome: Progressing Goal: Diagnostic test results will improve Outcome: Progressing   Problem: Activity: Goal: Risk for activity intolerance will decrease Outcome: Progressing   Problem: Nutrition: Goal: Adequate nutrition will be maintained Outcome: Progressing   Problem: Coping: Goal: Level of anxiety will decrease Outcome: Progressing   Problem: Elimination: Goal: Will not experience complications related to bowel motility Outcome: Progressing Goal: Will not experience complications related to urinary retention Outcome: Progressing   Problem: Pain Managment: Goal: General experience of comfort will improve and/or be controlled Outcome: Progressing   Problem: Safety: Goal: Ability to remain free from injury will improve Outcome: Progressing   Problem: Skin Integrity: Goal: Risk for impaired skin integrity will decrease Outcome: Progressing

## 2024-03-10 NOTE — Plan of Care (Signed)

## 2024-03-10 NOTE — TOC Transition Note (Signed)
 Transition of Care [facility]) - Discharge Note   Patient Details  Name: Diane Torres MRN: [id number] Date of Birth: 1940/07/07  [facility] CM/SW Contact:  Sharlyne Stabs, RN Phone Number: 03/10/2024, 1:57 PM   Clinical Narrative:  Shara received, Patient is medically ready to discharge to [facility]. Kerri aware, they are ready for the patient. RN will call report. CM at bedside to speak the daughter. Confirmed patient will begin PT tomorrow. They are concerned with weakness and her getting to he bathroom. Asking about Pure wic system. Advised she will probably need a pull up, as I do not believe SNF's use the purewic system.    Final next level of care: Skilled Nursing Facility Barriers to Discharge: Barriers Resolved   Patient Goals and CMS Choice Patient states their goals for this hospitalization and ongoing recovery are:: To get rehab CMS Medicare.gov Compare Post Acute Care list provided to:: Patient Choice offered to / list presented to : Patient [place] ownership interest in [facility].provided to:: Patient    Discharge Placement               Patient to be transferred to facility by: staff Name of family member notified: Daughters at the bedside Patient and family notified of of transfer: 03/10/24  Discharge Plan and Services Additional resources added to the After Visit Summary for   In-house Referral: Clinical Social Work Discharge Planning Services: NA Post Acute Care Choice: Skilled Nursing Facility          DME Arranged: N/A DME Agency: NA     Social Drivers of Health (SDOH) Interventions SDOH Screenings   Food Insecurity: No Food Insecurity (03/08/2024)  Housing: Low Risk (03/08/2024)  Transportation Needs: No Transportation Needs (03/08/2024)  Utilities: Not At Risk (03/08/2024)  Social Connections: Moderately Integrated (03/08/2024)  Tobacco Use: Low Risk (03/08/2024)

## 2024-03-10 NOTE — Progress Notes (Addendum)
 Initial Nutrition Assessment  DOCUMENTATION CODES:  Severe malnutrition in context of social or environmental circumstances  INTERVENTION:  Ensure Plus High Protein po BID, each supplement provides 350 kcal and 20 grams of protein Magic cup TID with meals, each supplement provides 290 kcal and 9 grams of protein Liquid MVI daily Suggestions for increasing calories and protein handout provided in discharge instructions  NUTRITION DIAGNOSIS:  Severe Malnutrition related to social / environmental circumstances as evidenced by severe muscle depletion, severe fat depletion.  GOAL:  Patient will meet greater than or equal to 90% of their needs  MONITOR:  PO intake, Supplement acceptance  REASON FOR ASSESSMENT:  Consult Poor PO, Diet education  ASSESSMENT:  84 yo female admitted with generalized weakness after being found on the floor at home. PMH includes HLD, hypothyroidism, GERD, diverticulitis, HTN, chronic intermittent diarrhea.  Spoke with patient and her two daughters at bedside. Patient typically eats a bagel for breakfast, nothing for lunch, and an Ensure for dinner. She has a poor appetite and early satiety, so she never eats a lot. On the weekends they go out to eat when her daughters come to visit her. She had sepsis a few years ago and since then, she has lost weight and muscle mass has decreased. She doesn't eat as much since her husband passed away a few years ago as well. She likes vanilla flavored supplements, but chocolate flavors tend to cause diarrhea. She used to take a MVI, but stopped taking it when she got choked on it. Discussed ways to increase intake at home. Daughters plan to prepare some meals on the weekends for patient to have during the week.   Patient meets criteria for severe malnutrition, given severe depletion of muscle and subcutaneous fat mass.  Usual weight: 65 kg (10/27/23) Current weight: 64.1 kg   Average Meal Intake: 2/4-2/5: 30% intake x 4  recorded meals  Nutritionally Relevant Medications: Scheduled Meds:  acetaminophen   1,000 mg Oral TID   amLODipine   5 mg Oral QHS   enoxaparin  (LOVENOX ) injection  40 mg Subcutaneous Q24H   escitalopram   5 mg Oral QODAY   feeding supplement  237 mL Oral BID BM   gabapentin   100 mg Oral BID   multivitamin  15 mL Oral Daily   Continuous Infusions: PRN Meds:.morphine  injection, polyethylene glycol, traMADol   Labs Reviewed: Na 132 CBG 99 (2/3)  NUTRITION - FOCUSED PHYSICAL EXAM: Flowsheet Row Most Recent Value  Orbital Region Severe depletion  Upper Arm Region Mild depletion  Thoracic and Lumbar Region Mild depletion  Buccal Region Severe depletion  Temple Region Severe depletion  Clavicle Bone Region Mild depletion  Clavicle and Acromion Bone Region Mild depletion  Scapular Bone Region Mild depletion  Dorsal Hand Severe depletion  Patellar Region Moderate depletion  Anterior Thigh Region Moderate depletion  Posterior Calf Region Moderate depletion  Edema (RD Assessment) Mild  Hair Reviewed  Eyes Reviewed  Mouth Reviewed  Skin Reviewed  Nails Reviewed   Diet Order:   Diet Order             Diet regular Room service appropriate? Yes; Fluid consistency: Thin  Diet effective now                  EDUCATION NEEDS:  Education needs have been addressed  Skin:  Skin Assessment: Reviewed RN Assessment  Last BM:  2/4 type 1  Height:  Ht Readings from Last 1 Encounters:  03/08/24 4' 11 (1.499 m)   Weight:  Wt Readings from Last 1 Encounters:  03/08/24 64.1 kg   Ideal Body Weight:  44.7 kg  BMI:  Body mass index is 28.54 kg/m.  Estimated Nutritional Needs:  Kcal:  1500-1700 Protein:  75-85 gm Fluid:  1.5 L   Suzen HUNT RD, LDN, CNSC Contact via secure chat. If unavailable, use group chat RD Inpatient.

## 2024-03-10 NOTE — Progress Notes (Signed)
 Physical Therapy Treatment Patient Details Name: [name] MRN: [id number] DOB: 1940/07/14 Today's Date: 03/10/2024   History of Present Illness Diane Torres is a 84 y.o. female with medical history significant for diverticulosis, hypertension, hypothyroidism.  Patient was brought to the ED by EMS after she was found by her daughter on the floor.  Patient was sleeping on the couch, and sometime between 12 and 3 AM she wanted to use the bathroom, she was unable to push herself off the couch to get up because of chronic pain to her shoulders, so she rolled off the couch onto the floor and crawled to the bathroom trying to use the toilet to get up.  She was still unable to get up, so she laid on the floor until her daughter found her this morning.  She denies frequent falls.  Occasionally ambulates with a cane.  She lives alone.  She is currently complaining of persistent pain to her shoulders, neck.  She has chronic low back pain with radiation of pain down her right leg this is unchanged from baseline.  Patient also has chronic intermittent diarrhea, unchanged also.  Daughter reports poor oral intake.  No vomiting.  No abdominal pain.    PT Comments  Pt friendly and willing to participate with therapy today.  Required mod A and cueing to assist with bed mobility with log rolling and reaching with UE to assist to sitting.  Required mod A with transfer and gait.  Able to sidestep front of bed then forward/backward gait with RW, stable balance and no LOB episodes during gait.  EOS pt left in chair with call bell within reach and family present.   If plan is discharge home, recommend the following:     Can travel by private vehicle        Equipment Recommendations       Recommendations for Other Services       Precautions / Restrictions Precautions Precautions: Fall Recall of Precautions/Restrictions: Intact     Mobility  Bed Mobility Overal bed mobility: Independent Bed Mobility: Supine  to Sit     Supine to sit: Mod assist     General bed mobility comments: increased time, labored movement, cueing for handplacement and rotation to sidelying prior sitting    Transfers Overall transfer level: Needs assistance Equipment used: Rolling walker (2 wheels)   Sit to Stand: Mod assist           General transfer comment: Cueing to push hands from bed vs reach for walker    Ambulation/Gait Ambulation/Gait assistance: Mod assist Gait Distance (Feet): 8 Feet Assistive device: Rolling walker (2 wheels)   Gait velocity: slow     General Gait Details: Sidestep front of bed then forward/backwards beside bed wiht RW   Stairs             Wheelchair Mobility     Tilt Bed    Modified Rankin (Stroke Patients Only)       Balance                                            Communication Communication Communication: No apparent difficulties  Cognition Arousal: Alert Behavior During Therapy: WFL for tasks assessed/performed   PT - Cognitive impairments: No apparent impairments  Following commands: Intact      Cueing Cueing Techniques: Verbal cues, Tactile cues  Exercises      General Comments        Pertinent Vitals/Pain Pain Assessment Pain Assessment: No/denies pain Pain Score: 5  Pain Location: L>R shoulder, back and generalized pain all over Pain Descriptors / Indicators: Aching, Discomfort, Sore, Grimacing Pain Intervention(s): Limited activity within patient's tolerance, Monitored during session, Repositioned    Home Living                          Prior Function            PT Goals (current goals can now be found in the care plan section)      Frequency           PT Plan      Co-evaluation              AM-PAC PT 6 Clicks Mobility   Outcome Measure  Help needed turning from your back to your side while in a flat bed without using bedrails?: A  Lot Help needed moving from lying on your back to sitting on the side of a flat bed without using bedrails?: A Lot Help needed moving to and from a bed to a chair (including a wheelchair)?: A Lot Help needed standing up from a chair using your arms (e.g., wheelchair or bedside chair)?: A Lot Help needed to walk in hospital room?: A Lot Help needed climbing 3-5 steps with a railing? : Total 6 Click Score: 11    End of Session Equipment Utilized During Treatment: Gait belt Activity Tolerance: Patient tolerated treatment well;Patient limited by fatigue Patient left: in chair;with call bell/phone within reach;with family/visitor present Nurse Communication: Mobility status       Time: 1030-1048 PT Time Calculation (min) (ACUTE ONLY): 18 min  Charges:    $Therapeutic Activity: 8-22 mins PT General Charges $$ ACUTE PT VISIT: 1 Visit                     Augustin Mclean, LPTA/CLT; CBIS [phone]  Mclean Augustin Amble 03/10/2024, 11:22 AM

## 2024-03-10 NOTE — Discharge Summary (Addendum)
 "     Physician Discharge Summary  Diane Torres FMW:[id number] DOB: [birth year] DOA: 03/08/2024  PCP: Shona Norleen PEDLAR, MD  Admit date: 03/08/2024 Discharge date: 03/10/2024  Admitted From:  Discharge disposition: SNF   Recommendations for Outpatient Follow-Up:   BMP/CBC 1 week Adjust blood pressure medicines as needed   Discharge Diagnosis:   Principal Problem:   Generalized weakness Active Problems:   Diarrhea   Essential hypertension   Hypothyroidism   Fall    Discharge Condition: Improved.  Diet recommendation:   Regular.  Wound care: None.  Code status: DNR   History of Present Illness:    Diane Torres is a 84 y.o. female with medical history significant for diverticulosis, hypertension, hypothyroidism. Patient was brought to the ED by EMS after she was found by her daughter on the floor.  Patient was sleeping on the couch, and sometime between 12 and 3 AM she wanted to use the bathroom, she was unable to push herself off the couch to get up because of chronic pain to her shoulders, so she rolled off the couch onto the floor and crawled to the bathroom trying to use the toilet to get up.  She was still unable to get up, so she laid on the floor until her daughter found her this morning.  She denies frequent falls.  Occasionally ambulates with a cane.  She lives alone.  She is currently complaining of persistent pain to her shoulders, neck.  She has chronic low back pain with radiation of pain down her right leg this is unchanged from baseline. Patient also has chronic intermittent diarrhea, unchanged also.  Daughter reports poor oral intake.  No vomiting.  No abdominal pain.   Hospital Course by Problem:   Generalized weakness-likely component of dehydration from chronic poor oral intake, chronic intermittent diarrhea as well as medications  - Chest x-ray, UA without acute abnormality, bilateral shoulder x-ray, pelvic x-ray without fracture or gross abnormalities.   Chronic pain to bilateral shoulders limiting related to stand from sitting or lying position-takes meloxicam PRN and was started on gabapentin  recently for pain. -Improved with hydration - PT evaluation-needs SNF - Resume gabapentin  but lower dose-monitor for sedation - Scheduled Tylenol  and limited Ultram  as needed   Hyponatremia - Stable, chronic per family - Check BMP as needed   Leukocytosis  -Will check blood cultures - Urinalysis unremarkable - WBCs trending down   Prolonged QTc, 507.  Potassium 5, magnesium  2.4. Avoid prolonging agents   Hypothyroidism - Resume Synthroid -TSH normal in 2025   Hypertension - Resume Norvasc  5 mg      Medical Consultants:      Discharge Exam:   Vitals:   03/10/24 0400 03/10/24 1020  BP: (!) 179/67 (!) 157/68  Pulse: 73   Resp: 18   Temp: (!) 97.4 F (36.3 C)   SpO2: 96%    Vitals:   03/09/24 1432 03/09/24 1957 03/10/24 0400 03/10/24 1020  BP: (!) 117/41 (!) 155/54 (!) 179/67 (!) 157/68  Pulse: 72 72 73   Resp: 20 18 18    Temp: 98.8 F (37.1 C) 98 F (36.7 C) (!) 97.4 F (36.3 C)   TempSrc: Oral Oral Oral   SpO2: 95% 93% 96%   Weight:      Height:        General exam: Appears calm and comfortable.  Feeling much better   The results of significant diagnostics from this hospitalization (including imaging, microbiology, ancillary and laboratory) are listed  below for reference.     Procedures and Diagnostic Studies:   DG Shoulder Right Result Date: 03/08/2024 CLINICAL DATA:  Right shoulder pain. EXAM: RIGHT SHOULDER - 2+ VIEW COMPARISON:  None Available. FINDINGS: There is no evidence of fracture or dislocation. Mild degenerative changes are seen involving the right acromioclavicular joint. Soft tissues are unremarkable. IMPRESSION: Mild degenerative changes involving the right acromioclavicular joint. Electronically Signed   By: Suzen Dials M.D.   On: 03/08/2024 13:18   DG Shoulder Left Portable Result Date:  03/08/2024 CLINICAL DATA:  Left shoulder pain after fall EXAM: LEFT SHOULDER COMPARISON:  None Available. FINDINGS: There is no evidence of fracture or dislocation. There is no evidence of arthropathy or other focal bone abnormality. Soft tissues are unremarkable. IMPRESSION: Negative. Electronically Signed   By: Lynwood Landy Raddle M.D.   On: 03/08/2024 12:56   DG Pelvis Portable Result Date: 03/08/2024 CLINICAL DATA:  Fall EXAM: PORTABLE PELVIS 1-2 VIEWS COMPARISON:  August 07, 2020 FINDINGS: There is no evidence of pelvic fracture or diastasis. No pelvic bone lesions are seen. IMPRESSION: Negative. Electronically Signed   By: Lynwood Landy Raddle M.D.   On: 03/08/2024 12:54   DG Chest Portable 1 View Result Date: 03/08/2024 CLINICAL DATA:  Fall EXAM: PORTABLE CHEST 1 VIEW COMPARISON:  Jun 20, 2023 FINDINGS: Stable cardiomediastinal silhouette. Both lungs are clear. The visualized skeletal structures are unremarkable. IMPRESSION: No active disease. Electronically Signed   By: Lynwood Landy Raddle M.D.   On: 03/08/2024 12:53     Labs:   Basic Metabolic Panel: Recent Labs  Lab 03/08/24 1238 03/09/24 0455 03/10/24 0443  NA 133* 131* 132*  K 5.0 4.7 4.3  CL 96* 96* 101  CO2 22 21* 23  GLUCOSE 100* 92 103*  BUN 24* 26* 20  CREATININE 0.84 1.04* 0.80  CALCIUM 9.9 8.9 8.7*  MG 2.4  --   --    GFR Estimated Creatinine Clearance: 42.6 mL/min (by C-G formula based on SCr of 0.8 mg/dL). Liver Function Tests: Recent Labs  Lab 03/08/24 1238  AST 24  ALT 15  ALKPHOS 61  BILITOT 0.4  PROT 7.7  ALBUMIN 3.7   No results for input(s): LIPASE, AMYLASE in the last 168 hours. No results for input(s): AMMONIA in the last 168 hours. Coagulation profile No results for input(s): INR, PROTIME in the last 168 hours.  CBC: Recent Labs  Lab 03/08/24 1238 03/09/24 0455 03/10/24 0443  WBC 17.2* 11.7* 10.5  NEUTROABS 14.7*  --   --   HGB 10.4* 9.3* 8.4*  HCT 32.1* 29.3* 26.5*  MCV 92.0 93.9 93.6   PLT 587* 487* 394   Cardiac Enzymes: Recent Labs  Lab 03/08/24 1238  CKTOTAL 94   BNP: Invalid input(s): POCBNP CBG: Recent Labs  Lab 03/08/24 1226  GLUCAP 99   D-Dimer No results for input(s): DDIMER in the last 72 hours. Hgb A1c No results for input(s): HGBA1C in the last 72 hours. Lipid Profile No results for input(s): CHOL, HDL, LDLCALC, TRIG, CHOLHDL, LDLDIRECT in the last 72 hours. Thyroid  function studies No results for input(s): TSH, T4TOTAL, T3FREE, THYROIDAB in the last 72 hours.  Invalid input(s): FREET3 Anemia work up No results for input(s): VITAMINB12, FOLATE, FERRITIN, TIBC, IRON, RETICCTPCT in the last 72 hours. Microbiology Recent Results (from the past 240 hours)  Culture, blood (Routine X 2) w Reflex to ID Panel     Status: None (Preliminary result)   Collection Time: 03/09/24 10:39 AM  Specimen: BLOOD RIGHT FOREARM  Result Value Ref Range Status   Specimen Description BLOOD RIGHT FOREARM  Final   Special Requests BAA BCAV  Final   Culture   Final    NO GROWTH < 24 HOURS Performed at [facility], [address], [place], [place] [address]    Report Status PENDING  Incomplete  Culture, blood (Routine X 2) w Reflex to ID Panel     Status: None (Preliminary result)   Collection Time: 03/09/24 10:39 AM   Specimen: Right Antecubital; Blood  Result Value Ref Range Status   Specimen Description RIGHT ANTECUBITAL  Final   Special Requests BAA BCAV  Final   Culture   Final    NO GROWTH < 24 HOURS Performed at [facility], [address], [place], [place] [address]    Report Status PENDING  Incomplete     Discharge Instructions:   Discharge Instructions     Diet general   Complete by: As directed    Increase activity slowly   Complete by: As directed       Allergies as of 03/10/2024       Reactions   Bee Venom Anaphylaxis        Medication List     TAKE these medications    acetaminophen   500 MG tablet Commonly known as: TYLENOL  Take 2 tablets (1,000 mg total) by mouth 3 (three) times daily. What changed:  how much to take when to take this reasons to take this   amLODipine  5 MG tablet Commonly known as: NORVASC  Take 5 mg by mouth at bedtime.   dicyclomine  10 MG capsule Commonly known as: BENTYL  Take 1-2 capsule up to twice daily for abdominal pain, loose stools. May cause drowsiness, dry mouth, constipation.   escitalopram  5 MG tablet Commonly known as: LEXAPRO  Take 5 mg by mouth every other day.   feeding supplement Liqd Take 237 mLs by mouth 2 (two) times daily between meals.   gabapentin  100 MG capsule Commonly known as: NEURONTIN  Take 1 capsule (100 mg total) by mouth 2 (two) times daily.   lidocaine  5 % Commonly known as: LIDODERM  Place 3 patches onto the skin daily. Remove & Discard patch within 12 hours or as directed by MD   loratadine  10 MG tablet Commonly known as: CLARITIN  Take 10 mg by mouth daily.   meloxicam 7.5 MG tablet Commonly known as: MOBIC Take 7.5 mg by mouth in the morning and at bedtime.   multivitamin Liqd Take 15 mLs by mouth daily.   pantoprazole  40 MG tablet Commonly known as: PROTONIX  Take 40 mg by mouth daily.   polyethylene glycol 17 g packet Commonly known as: MIRALAX  / GLYCOLAX  Take 17 g by mouth daily as needed for mild constipation.   simvastatin  40 MG tablet Commonly known as: ZOCOR  Take 1 tablet (40 mg total) by mouth at bedtime.   traMADol  50 MG tablet Commonly known as: ULTRAM  Take 1 tablet (50 mg total) by mouth every 6 (six) hours as needed for moderate pain (pain score 4-6) or severe pain (pain score 7-10).        Contact information for follow-up providers     Shona Norleen PEDLAR, MD Follow up in 1 week(s).   Specialty: Internal Medicine Contact information: [address] [place] [address] [phone]              Contact information for after-discharge care     Destination      [facility] .   Service: Skilled Nursing Contact information: [address] [place]  [address] [phone]                      Time coordinating discharge: 45 min  Signed:  Harlene RAYMOND Bowl DO  [facility] 03/10/2024, 2:01 PM      "

## 2024-03-11 ENCOUNTER — Encounter: Payer: Self-pay | Admitting: Adult Health

## 2024-03-11 ENCOUNTER — Non-Acute Institutional Stay: Payer: Self-pay | Admitting: Adult Health

## 2024-03-11 LAB — CULTURE, BLOOD (ROUTINE X 2)
Culture: NO GROWTH
Culture: NO GROWTH

## 2024-03-11 NOTE — Progress Notes (Unsigned)
 " Location:  [facility] Room Number: 136 P Place of Service:  SNF (31)   CODE STATUS: DNR, Managed Care  Allergies[1]  Chief Complaint  Patient presents with   Hospitalization Follow-up    HPI:    Past Medical History:  Diagnosis Date   CAP (community acquired pneumonia) 05/2018   Diverticulitis    CT verified in 2015, 2018, 2019, 2020, 2023   GERD (gastroesophageal reflux disease)    Hypertension    Hypothyroidism    Impaired fasting glucose    Mixed hyperlipidemia     Past Surgical History:  Procedure Laterality Date   CATARACT EXTRACTION W/PHACO Right 01/08/2023   Procedure: CATARACT EXTRACTION PHACO AND INTRAOCULAR LENS PLACEMENT (IOC) RIGHT  10.65  00:58.3;  Surgeon: Mittie Gaskin, MD;  Location: [facility];  Service: Ophthalmology;  Laterality: Right;   CATARACT EXTRACTION W/PHACO Left 01/21/2023   Procedure: CATARACT EXTRACTION PHACO AND INTRAOCULAR LENS PLACEMENT (IOC) LEFT 5.73 00:52.6;  Surgeon: Mittie Gaskin, MD;  Location: [facility];  Service: Ophthalmology;  Laterality: Left;   COLONOSCOPY     COLONOSCOPY N/A 12/12/2015   Dr. Shaaron: one 3 mm tubular adenoma in rectum s/p removal. Diverticulosis in entire colon. Internal hemorrhoids.    COLONOSCOPY WITH PROPOFOL  N/A 08/21/2021   Procedure: COLONOSCOPY WITH PROPOFOL ;  Surgeon: Shaaron Lamar HERO, MD;  Location: [facility];  Service: Endoscopy;  Laterality: N/A;  10:15am   ESOPHAGOGASTRODUODENOSCOPY N/A 03/19/2016   Dr. Shaaron: LA Grade A esophagitis, empiric dilation, normal stomach, normal duodenum   Left lobe thyroidectomy     MALONEY DILATION N/A 03/19/2016   Procedure: AGAPITO DILATION;  Surgeon: Lamar HERO Shaaron, MD;  Location: [facility];  Service: Endoscopy;  Laterality: N/A;   POLYPECTOMY  12/12/2015   Procedure: POLYPECTOMY;  Surgeon: Lamar HERO Shaaron, MD;  Location: [facility];  Service: Endoscopy;;  colon   POLYPECTOMY  08/21/2021   Procedure:  POLYPECTOMY;  Surgeon: Shaaron Lamar HERO, MD;  Location: [facility];  Service: Endoscopy;;  splenic flexure;   RECTOCELE REPAIR     TOTAL ABDOMINAL HYSTERECTOMY W/ BILATERAL SALPINGOOPHORECTOMY      Social History   Socioeconomic History   Marital status: Widowed    Spouse name: Not on file   Number of children: Not on file   Years of education: Not on file   Highest education level: Not on file  Occupational History   Not on file  Tobacco Use   Smoking status: Never   Smokeless tobacco: Never  Vaping Use   Vaping status: Never Used  Substance and Sexual Activity   Alcohol use: No   Drug use: No   Sexual activity: Yes  Other Topics Concern   Not on file  Social History Narrative   Not on file   Social Drivers of Health   Tobacco Use: Low Risk (03/11/2024)   Patient History    Smoking Tobacco Use: Never    Smokeless Tobacco Use: Never    Passive Exposure: Not on file  Financial Resource Strain: Not on file  Food Insecurity: No Food Insecurity (03/08/2024)   Epic    Worried About Programme Researcher, Broadcasting/film/video in the Last Year: Never true    Ran Out of Food in the Last Year: Never true  Transportation Needs: No Transportation Needs (03/08/2024)   Epic    Lack of Transportation (Medical): No    Lack of Transportation (Non-Medical): No  Physical Activity: Not on file  Stress:  Not on file  Social Connections: Moderately Integrated (03/08/2024)   Social Connection and Isolation Panel    Frequency of Communication with Friends and Family: More than three times a week    Frequency of Social Gatherings with Friends and Family: Three times a week    Attends Religious Services: More than 4 times per year    Active Member of Clubs or Organizations: Yes    Attends Banker Meetings: More than 4 times per year    Marital Status: Widowed  Intimate Partner Violence: Not At Risk (03/08/2024)   Epic    Fear of Current or Ex-Partner: No    Emotionally Abused: No    Physically Abused:  No    Sexually Abused: No  Depression (PHQ2-9): Not on file  Alcohol Screen: Not on file  Housing: Low Risk (03/08/2024)   Epic    Unable to Pay for Housing in the Last Year: No    Number of Times Moved in the Last Year: 0    Homeless in the Last Year: No  Utilities: Not At Risk (03/08/2024)   Epic    Threatened with loss of utilities: No  Health Literacy: Not on file   Family History  Problem Relation Age of Onset   CAD Brother        2 brothers with CAD in their 59s   Crohn's disease Brother        had colon resection   Pancreatic cancer Brother    CAD Father    Heart failure Mother    Diabetes Mellitus II Mother    Colon cancer Neg Hx       VITAL SIGNS BP 134/62   Pulse 84   Temp 98 F (36.7 C)   Resp 20   Ht 4' 11 (1.499 m)   Wt 144 lb 6.4 oz (65.5 kg)   SpO2 96%   BMI 29.17 kg/m   Outpatient Encounter Medications as of 03/11/2024  Medication Sig   acetaminophen  (TYLENOL ) 500 MG tablet Take 2 tablets (1,000 mg total) by mouth 3 (three) times daily.   amLODipine  (NORVASC ) 5 MG tablet Take 5 mg by mouth at bedtime.   b complex-vitamin c-folic acid  (NEPHRO-VITE) 0.8 MG TABS tablet Take 1 tablet by mouth. Once A Day 10:00 AM   dicyclomine  (BENTYL ) 10 MG capsule Take 1-2 capsule up to twice daily for abdominal pain, loose stools. May cause drowsiness, dry mouth, constipation.   escitalopram  (LEXAPRO ) 5 MG tablet Take 5 mg by mouth every other day.   feeding supplement (ENSURE PLUS HIGH PROTEIN) LIQD Take 237 mLs by mouth 2 (two) times daily between meals.   gabapentin  (NEURONTIN ) 100 MG capsule Take 1 capsule (100 mg total) by mouth 2 (two) times daily.   lidocaine  (LIDODERM ) 5 % Place 3 patches onto the skin daily. Remove & Discard patch within 12 hours or as directed by MD   loratadine  (CLARITIN ) 10 MG tablet Take 10 mg by mouth daily.   meloxicam (MOBIC) 7.5 MG tablet Take 7.5 mg by mouth in the morning and at bedtime.   pantoprazole  (PROTONIX ) 40 MG tablet Take 40  mg by mouth daily.   polyethylene glycol (MIRALAX  / GLYCOLAX ) 17 g packet Take 17 g by mouth daily as needed for mild constipation.   simvastatin  (ZOCOR ) 40 MG tablet Take 1 tablet (40 mg total) by mouth at bedtime.   traMADol  (ULTRAM ) 50 MG tablet Take 1 tablet (50 mg total) by mouth every 6 (six) hours  as needed for moderate pain (pain score 4-6) or severe pain (pain score 7-10).   Multiple Vitamin (MULTIVITAMIN) LIQD Take 15 mLs by mouth daily. (Patient not taking: Reported on 03/11/2024)   No facility-administered encounter medications on file as of 03/11/2024.     SIGNIFICANT DIAGNOSTIC EXAMS       ASSESSMENT/ PLAN:     Barnie Seip NP [facility]  Contact [phone] Monday through Friday 8am- 5pm  After hours call [phone]      [1]  Allergies Allergen Reactions   Bee Venom Anaphylaxis   "
